# Patient Record
Sex: Female | Born: 1970 | Race: White | Hispanic: Yes | Smoking: Never smoker
Health system: Southern US, Community
[De-identification: ages and names within clinical notes are randomized; demographics above are authoritative.]

## PROBLEM LIST (undated history)

## (undated) ENCOUNTER — Emergency Department (HOSPITAL_BASED_OUTPATIENT_CLINIC_OR_DEPARTMENT_OTHER): Admission: EM | Payer: Self-pay

## (undated) ENCOUNTER — Ambulatory Visit: Admission: EM | Payer: Self-pay

## (undated) DIAGNOSIS — D649 Anemia, unspecified: Secondary | ICD-10-CM

## (undated) DIAGNOSIS — C801 Malignant (primary) neoplasm, unspecified: Secondary | ICD-10-CM

## (undated) HISTORY — PX: APPENDECTOMY: SHX54

## (undated) HISTORY — DX: Anemia, unspecified: D64.9

## (undated) HISTORY — PX: PARTIAL HYSTERECTOMY: SHX80

## (undated) HISTORY — PX: KIDNEY SURGERY: SHX687

---

## 1991-04-05 HISTORY — PX: OTHER SURGICAL HISTORY: SHX169

## 2011-02-28 ENCOUNTER — Other Ambulatory Visit (HOSPITAL_COMMUNITY): Payer: Self-pay | Admitting: Family Medicine

## 2011-02-28 DIAGNOSIS — Z1231 Encounter for screening mammogram for malignant neoplasm of breast: Secondary | ICD-10-CM

## 2011-04-06 ENCOUNTER — Ambulatory Visit (HOSPITAL_COMMUNITY)
Admission: RE | Admit: 2011-04-06 | Discharge: 2011-04-06 | Disposition: A | Discharge status: Home / Self Care | Payer: Self-pay | Source: Ambulatory Visit | Attending: Family Medicine | Admitting: Family Medicine

## 2011-04-06 DIAGNOSIS — Z1231 Encounter for screening mammogram for malignant neoplasm of breast: Secondary | ICD-10-CM | POA: Insufficient documentation

## 2011-04-19 ENCOUNTER — Emergency Department (HOSPITAL_COMMUNITY): Payer: Self-pay

## 2011-04-19 ENCOUNTER — Encounter (HOSPITAL_COMMUNITY): Payer: Self-pay | Admitting: *Deleted

## 2011-04-19 ENCOUNTER — Emergency Department (HOSPITAL_COMMUNITY)
Admission: EM | Admit: 2011-04-19 | Discharge: 2011-04-19 | Disposition: A | Discharge status: Home / Self Care | Payer: Self-pay | Attending: Emergency Medicine | Admitting: Emergency Medicine

## 2011-04-19 DIAGNOSIS — L03213 Periorbital cellulitis: Secondary | ICD-10-CM

## 2011-04-19 DIAGNOSIS — H5710 Ocular pain, unspecified eye: Secondary | ICD-10-CM | POA: Insufficient documentation

## 2011-04-19 DIAGNOSIS — H5789 Other specified disorders of eye and adnexa: Secondary | ICD-10-CM | POA: Insufficient documentation

## 2011-04-19 DIAGNOSIS — H00039 Abscess of eyelid unspecified eye, unspecified eyelid: Secondary | ICD-10-CM | POA: Insufficient documentation

## 2011-04-19 LAB — POCT I-STAT, CHEM 8
Calcium, Ion: 1.18 mmol/L (ref 1.12–1.32)
Chloride: 107 mEq/L (ref 96–112)
Glucose, Bld: 84 mg/dL (ref 70–99)
HCT: 34 % — ABNORMAL LOW (ref 36.0–46.0)
Hemoglobin: 11.6 g/dL — ABNORMAL LOW (ref 12.0–15.0)
TCO2: 24 mmol/L (ref 0–100)

## 2011-04-19 IMAGING — CT CT MAXILLOFACIAL W/ CM
4 of 5 series · 18 of 30 positions shown, 19 images · IV contrast (80ml omni 300)
Comparison: None.

CLINICAL DATA: Left orbital pain.  Question orbital cellulitis.

CT MAXILLOFACIAL WITH CONTRAST
TECHNIQUE: Multidetector CT imaging of the maxillofacial
structures was performed with intravenous contrast. Multiplanar CT
image reconstructions were also generated.
Contrast: 80mL OMNIPAQUE IOHEXOL 300 MG/ML IV SOLN

[Series 3: recon 2: supine facial bones · axial · 0.33mm/px · z∈[+60,+157]mm · 4 of 67 slices shown, 5 images]
[im 14/67  brain]
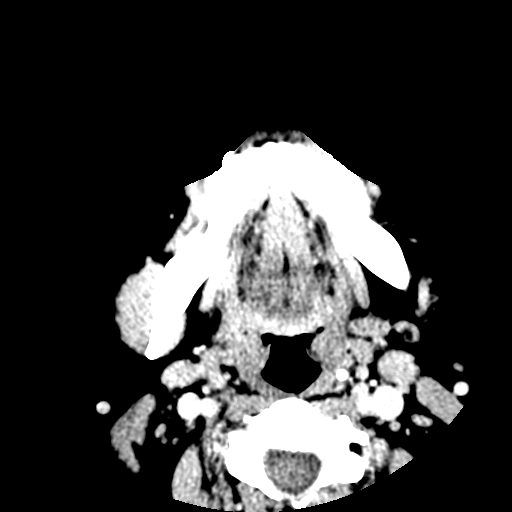
[im 14/67  bone]
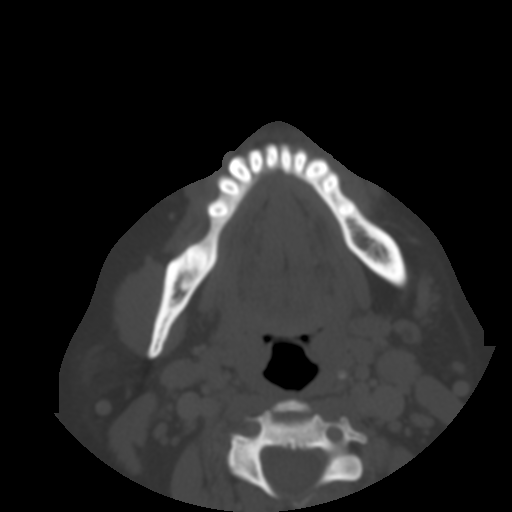
[im 27/67  bone]
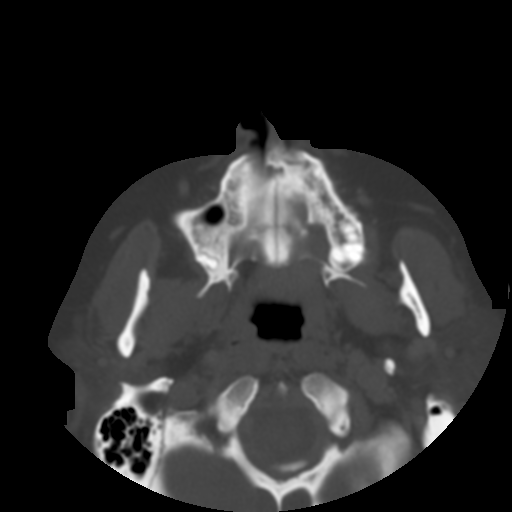
[im 40/67  bone]
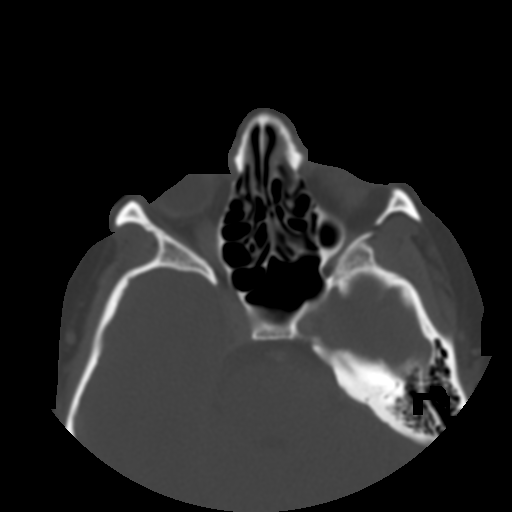
[im 53/67  bone]
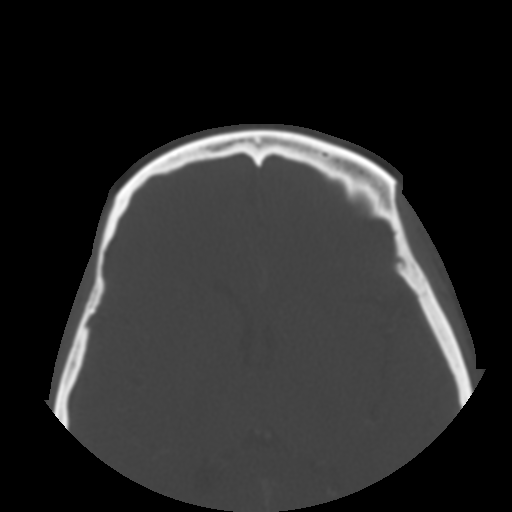

[Series 104: sag tissue · sagittal · 0.33mm/px · 5 of 81 slices shown]
[im 14/81  bone]
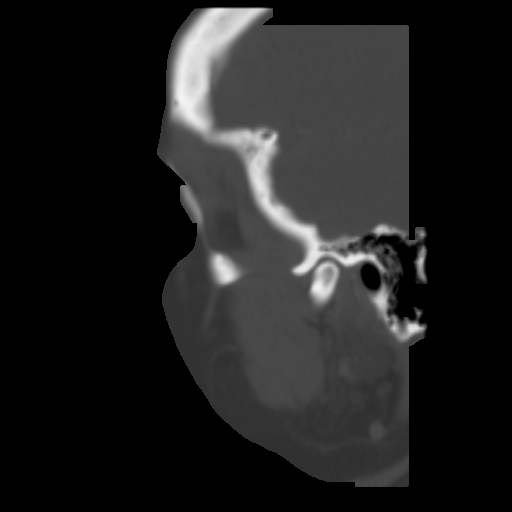
[im 27/81  bone]
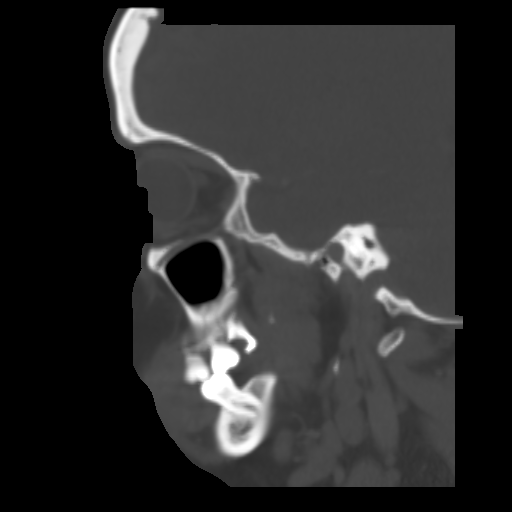
[im 41/81  bone]
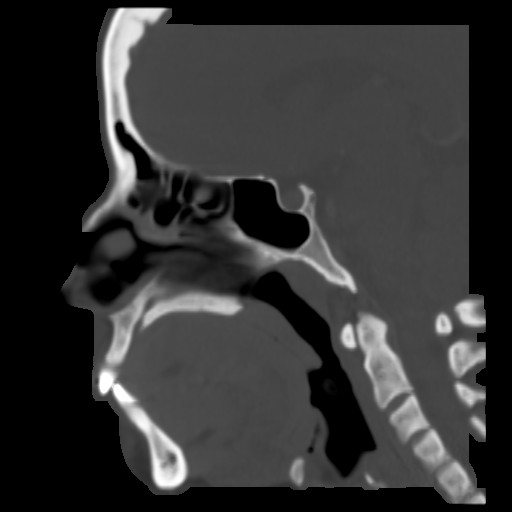
[im 54/81  bone]
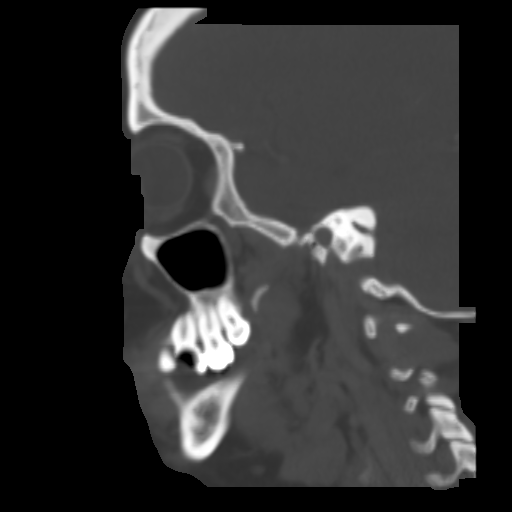
[im 67/81  bone]
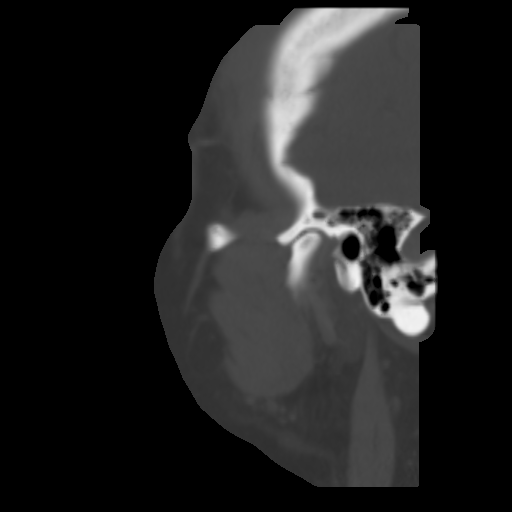

[Series 400: coronal bone · coronal · 0.33mm/px · 4 of 75 slices shown]
[im 15/75  bone]
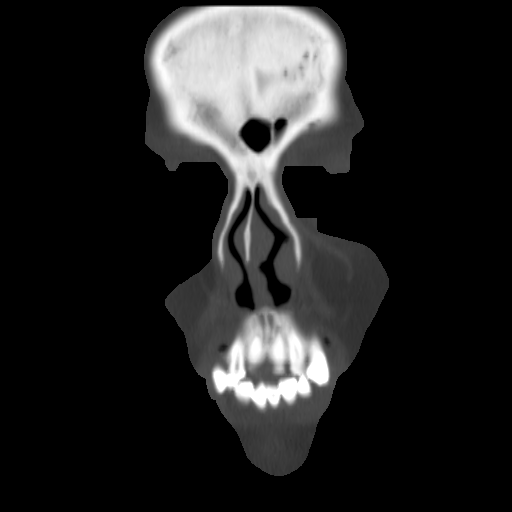
[im 30/75  bone]
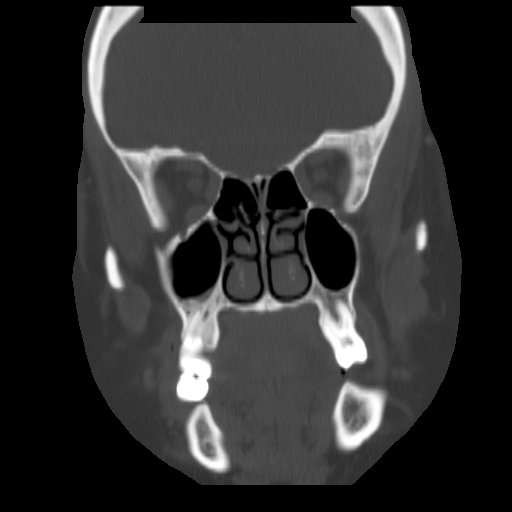
[im 45/75  bone]
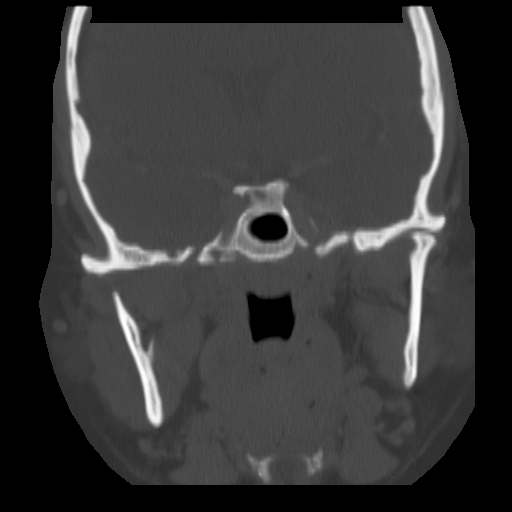
[im 60/75  bone]
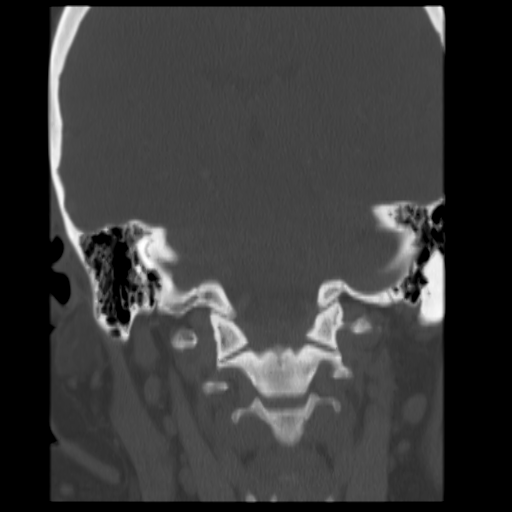

[Series 401: sag bone · sagittal · 0.33mm/px · 5 of 78 slices shown]
[im 13/78  bone]
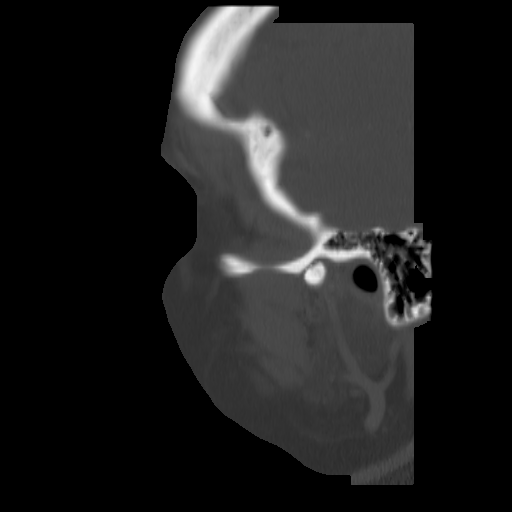
[im 26/78  bone]
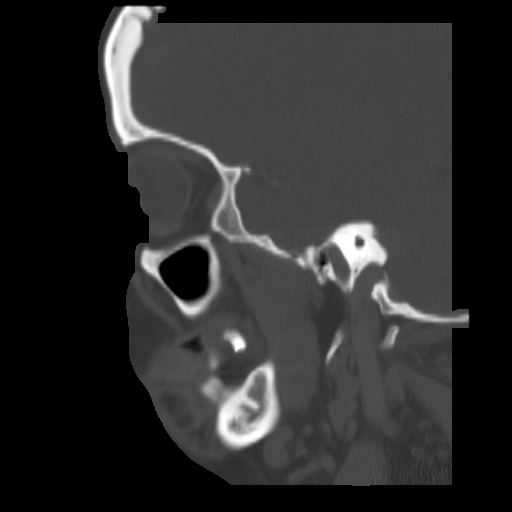
[im 39/78  bone]
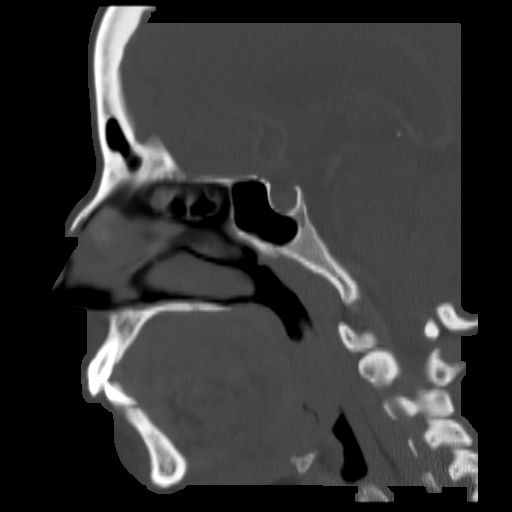
[im 52/78  bone]
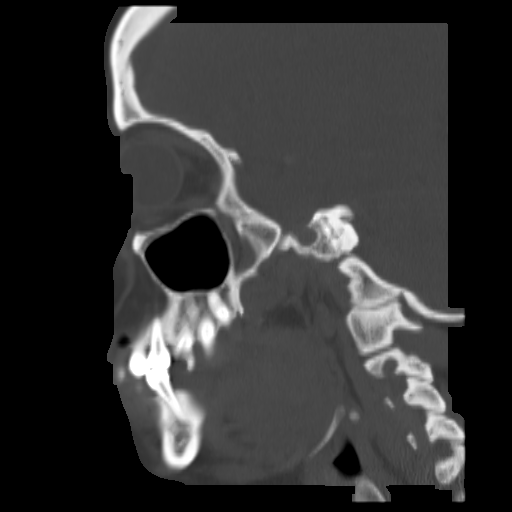
[im 65/78  bone]
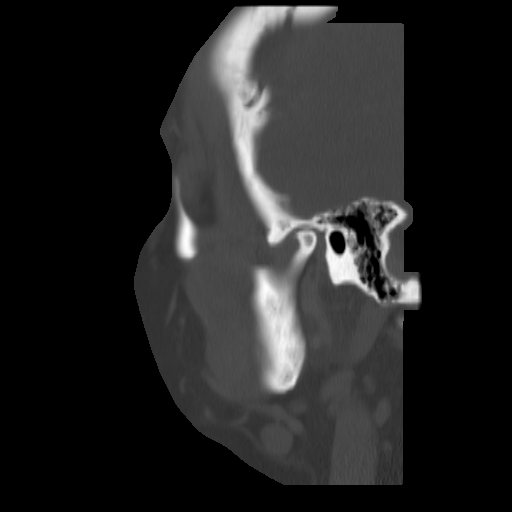

[18 of 30 positions shown; findings below may reference images not displayed]

FINDINGS: There is minimal haziness within the subcutaneous soft
tissues inferior to the left orbit which could represent very early
changes of infraorbital cellulitis.  No fluid collections.  No
orbital abnormality.  No orbital emphysema.  Paranasal sinuses are
clear.  No acute bony abnormality.
IMPRESSION: Very minimal stranding in the subcutaneous fat inferior to the left
orbit in the superior left cheek region.  No focal fluid
collections.

## 2011-04-19 MED ORDER — CLINDAMYCIN HCL 150 MG PO CAPS: 300.0000 mg | ORAL_CAPSULE | Freq: Three times a day (TID) | ORAL | Status: AC

## 2011-04-19 MED ORDER — IOHEXOL 300 MG/ML  SOLN
80.0000 mL | Freq: Once | INTRAMUSCULAR | Status: AC | PRN
  Administered 2011-04-19: 80 mL via INTRAVENOUS

## 2011-04-19 MED ORDER — IBUPROFEN 800 MG PO TABS
800.0000 mg | ORAL_TABLET | Freq: Once | ORAL | Status: AC
  Administered 2011-04-19: 800 mg via ORAL
  Filled 2011-04-19: qty 1

## 2011-04-19 MED ORDER — HYDROCODONE-ACETAMINOPHEN 5-325 MG PO TABS: 1.0000 | ORAL_TABLET | ORAL | Status: AC | PRN

## 2011-04-19 NOTE — ED Notes (Signed)
2 bumps came up in left eye and is painful.  No problem with vision.  Has pain with eye movemetn

## 2011-04-19 NOTE — ED Notes (Signed)
tTriage completed with use of phone interpreter

## 2011-04-19 NOTE — ED Provider Notes (Signed)
History     CSN: [id number]  Arrival date & time 04/19/11  1031   First MD Initiated Contact with Patient 04/19/11 1118      Chief Complaint  Patient presents with  . Eye Pain    left    (Consider location/radiation/quality/duration/timing/severity/associated sxs/prior treatment) HPI History provided by pt and interpretor.   Pt developed pain and edema inferior to left eye yesterday.  No associated vision changes.  Denies fever.  Denies trauma.  Does not wear contacts or glasses.  Has never had these sx in the past.   History reviewed. No pertinent past medical history.  Past Surgical History  Procedure Date  . Other surgical history     c-section    No family history on file.  History  Substance Use Topics  . Smoking status: Never Smoker   . Smokeless tobacco: Not on file  . Alcohol Use: No    OB History    Grav Para Term Preterm Abortions TAB SAB Ect Mult Living                  Review of Systems  All other systems reviewed and are negative.    Allergies  Review of patient's allergies indicates no known allergies.  Home Medications  No current outpatient prescriptions on file.  BP 128/63  Pulse 67  Resp 18  SpO2 97%  Physical Exam  Nursing note and vitals reviewed. Constitutional: She is oriented to person, place, and time. She appears well-developed and well-nourished. No distress.  HENT:  Head: Normocephalic and atraumatic.  Eyes:       Mild edema as well as tenderness just inferior to left eye.  PERRL.  Globe non-tender and conjunctiva w/out injection.  EOMi but pt reports pain and appears uncomfortable w/ eye movement.  Vision 20/30 bilaterally and 20/25 both.  Neck: Normal range of motion.  Neurological: She is alert and oriented to person, place, and time.  Psychiatric: She has a normal mood and affect. Her behavior is normal.    ED Course  Procedures (including critical care time)  Labs Reviewed  POCT I-STAT, CHEM 8 - Abnormal;  Notable for the following:    Hemoglobin 11.6 (*)    HCT 34.0 (*)    All other components within normal limits  I-STAT, CHEM 8   Ct Maxillofacial W/cm  04/19/2011  *RADIOLOGY REPORT*  Clinical Data: Left orbital pain.  Question orbital cellulitis.  CT MAXILLOFACIAL WITH CONTRAST  Technique:  Multidetector CT imaging of the maxillofacial structures was performed with intravenous contrast. Multiplanar CT image reconstructions were also generated.  Contrast: 80mL OMNIPAQUE IOHEXOL 300 MG/ML IV SOLN  Comparison: None.  Findings: There is minimal haziness within the subcutaneous soft tissues inferior to the left orbit which could represent very early changes of infraorbital cellulitis.  No fluid collections.  No orbital abnormality.  No orbital emphysema.  Paranasal sinuses are clear.  No acute bony abnormality.  IMPRESSION: Very minimal stranding in the subcutaneous fat inferior to the left orbit in the superior left cheek region.  No focal fluid collections.  Original Report Authenticated By: Cyndie Chime, M.D.     1. Preseptal cellulitis       MDM  Pt presents w/ non-traumatic L eye pain.  No associated fever or vision changes.  Exam sig for edema and tenderness of L lower orbit and nml vision.  CT maxillofacial ordered to r/o orbital cellulitis.    CT shows minimal stranding of subq fat inferior  to left orbit which could represent early preseptal cellulitis.  Results discussed w/ pt via interpreter.  D/c'd home w/ clinda, vicodin and referral to ophtho for persistent sx.  Return precautions discussed. 1:53 PM        Otilio Miu, [place] 04/19/11 1545

## 2011-04-19 NOTE — ED Provider Notes (Signed)
Medical screening examination/treatment/procedure(s) were performed by non-physician practitioner and as supervising physician I was immediately available for consultation/collaboration.  Nicholes Stairs, MD 04/19/11 [phone]

## 2011-07-28 ENCOUNTER — Encounter (HOSPITAL_COMMUNITY): Payer: Self-pay | Admitting: *Deleted

## 2011-07-28 ENCOUNTER — Emergency Department (HOSPITAL_COMMUNITY)
Admission: EM | Admit: 2011-07-28 | Discharge: 2011-07-28 | Disposition: A | Discharge status: Home / Self Care | Payer: Self-pay | Source: Home / Self Care

## 2011-07-28 DIAGNOSIS — L600 Ingrowing nail: Secondary | ICD-10-CM

## 2011-07-28 NOTE — ED Provider Notes (Signed)
Mariah Lang is a 41 y.o. female who presents to urgent care today for ingrown right great toenail. Present for many years but worse he became painful and swollen about one week ago. She applied an antibiotic purple dye to it recently. No fevers or chills. She's never had a toenail removal. She feels well otherwise.   PMH, SH reviewed: Otherwise healthy woman nonsmoker ROS as above  Medications reviewed. No current facility-administered medications for this encounter.   No current outpatient prescriptions on file.    Exam:  BP 141/85  Pulse 80  Temp(Src) 98.4 F (36.9 C) (Oral)  Resp 18  SpO2 95% Gen: Well NAD RIGHT TOENAIL: Significantly ingrown on the medial aspect. Hypertrophied tissue around the toenail that is erythematous and tender.  Procedure note: Partial toenail removal.  Consent obtained and timeout performed. Area cleaned with Betadine. 7 mL of 2% lidocaine without epinephrine were injected on the medial and dorsal nerves and underneath the nailbed. Tourniquet was applied. The nail elevator was used to elevate the medial aspect of the nail. Large scissors were used to cut the medial nail sliver approximately 5 mm in width.  The nail sliver was grabbed with hemostats and removed as a whole piece.  The nail bed was then scraped.  Tourniquet removed. Total tourniquet time less than 5 minutes. A dressing was then applied. Patient tolerated the procedure well  No results found for this or any previous visit (from the past 72 hour(s)).  Assessment and plan:  41 year old woman with ingrown toenail.  Medial partial, removal performed today. Patient tolerated the procedure well. Discussed warning signs or symptoms or further infection. Patient expresses understanding. Handout in Spanish provided.  Follow up with primary care doctor in 1-4 weeks.   Rodolph Bong, MD 07/28/11 [phone]

## 2011-07-28 NOTE — Discharge Instructions (Signed)
[facility].  Regresse con Engineer, petroleum en 3 semanas.

## 2011-07-28 NOTE — ED Notes (Signed)
Pt  Has  painfull  Swollen l  Big  Toe it  Is  Red   And  painfull    The  Toe  Appears  To  Be  Ingrown        She  Has  Had   The  Symptoms  For  About  1  Week

## 2011-07-30 NOTE — ED Provider Notes (Signed)
Medical screening examination/treatment/procedure(s) were performed by a resident physician and as supervising physician I was immediately available for consultation/collaboration.  Leslee Home, M.D.   Reuben Likes, MD 07/30/11 0930

## 2012-08-31 ENCOUNTER — Encounter (HOSPITAL_COMMUNITY): Payer: Self-pay | Admitting: Emergency Medicine

## 2012-08-31 ENCOUNTER — Inpatient Hospital Stay (HOSPITAL_COMMUNITY)
Admission: AD | Admit: 2012-08-31 | Discharge: 2012-08-31 | Disposition: A | Discharge status: Home / Self Care | Payer: PRIVATE HEALTH INSURANCE | Source: Ambulatory Visit | Attending: Obstetrics & Gynecology | Admitting: Obstetrics & Gynecology

## 2012-08-31 ENCOUNTER — Encounter (HOSPITAL_COMMUNITY): Payer: Self-pay | Admitting: *Deleted

## 2012-08-31 ENCOUNTER — Emergency Department (HOSPITAL_COMMUNITY)
Admission: EM | Admit: 2012-08-31 | Discharge: 2012-08-31 | Disposition: A | Discharge status: Home / Self Care | Payer: PRIVATE HEALTH INSURANCE | Source: Home / Self Care | Attending: Family Medicine | Admitting: Family Medicine

## 2012-08-31 ENCOUNTER — Inpatient Hospital Stay (HOSPITAL_COMMUNITY): Payer: PRIVATE HEALTH INSURANCE

## 2012-08-31 DIAGNOSIS — N949 Unspecified condition associated with female genital organs and menstrual cycle: Secondary | ICD-10-CM | POA: Insufficient documentation

## 2012-08-31 DIAGNOSIS — N926 Irregular menstruation, unspecified: Secondary | ICD-10-CM | POA: Insufficient documentation

## 2012-08-31 DIAGNOSIS — N7013 Chronic salpingitis and oophoritis: Secondary | ICD-10-CM | POA: Insufficient documentation

## 2012-08-31 DIAGNOSIS — D259 Leiomyoma of uterus, unspecified: Secondary | ICD-10-CM

## 2012-08-31 DIAGNOSIS — N7011 Chronic salpingitis: Secondary | ICD-10-CM

## 2012-08-31 DIAGNOSIS — D649 Anemia, unspecified: Secondary | ICD-10-CM | POA: Insufficient documentation

## 2012-08-31 DIAGNOSIS — N944 Primary dysmenorrhea: Secondary | ICD-10-CM

## 2012-08-31 DIAGNOSIS — N946 Dysmenorrhea, unspecified: Secondary | ICD-10-CM

## 2012-08-31 LAB — POCT URINALYSIS DIP (DEVICE)
Ketones, ur: NEGATIVE mg/dL
Protein, ur: NEGATIVE mg/dL
Specific Gravity, Urine: 1.02 (ref 1.005–1.030)
Urobilinogen, UA: 0.2 mg/dL (ref 0.0–1.0)
pH: 7 (ref 5.0–8.0)

## 2012-08-31 LAB — CBC
HCT: 28.7 % — ABNORMAL LOW (ref 36.0–46.0)
MCH: 18.2 pg — ABNORMAL LOW (ref 26.0–34.0)
MCHC: 28.6 g/dL — ABNORMAL LOW (ref 30.0–36.0)
MCV: 63.8 fL — ABNORMAL LOW (ref 78.0–100.0)
RDW: 18.5 % — ABNORMAL HIGH (ref 11.5–15.5)
WBC: 9.1 10*3/uL (ref 4.0–10.5)

## 2012-08-31 LAB — WET PREP, GENITAL: Yeast Wet Prep HPF POC: NONE SEEN

## 2012-08-31 LAB — POCT PREGNANCY, URINE: Preg Test, Ur: NEGATIVE

## 2012-08-31 IMAGING — US US TRANSVAGINAL NON-OB
1 series · 13 of 25 positions shown · non-contrast
Comparison: None.

CLINICAL DATA: Dysmenorrhea.  Heavy vaginal bleeding for several
months.



[Series 1: us pelvis complete · 77 acquisitions, 13 frames shown]
[im 1/77]
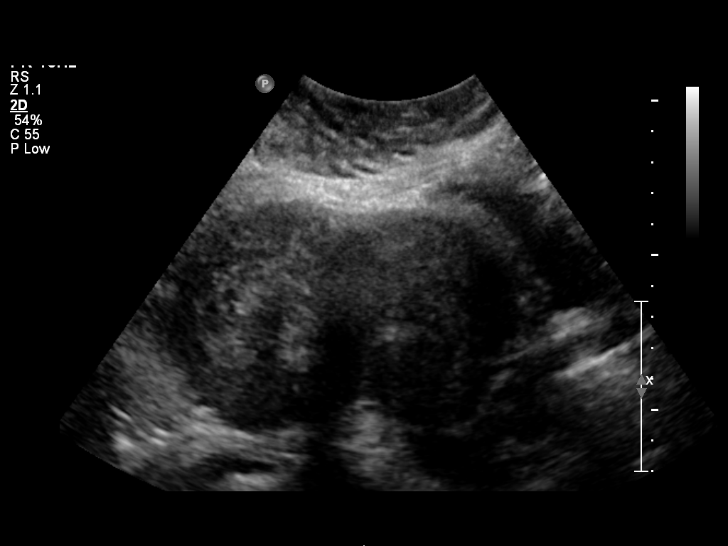
[im 7/77]
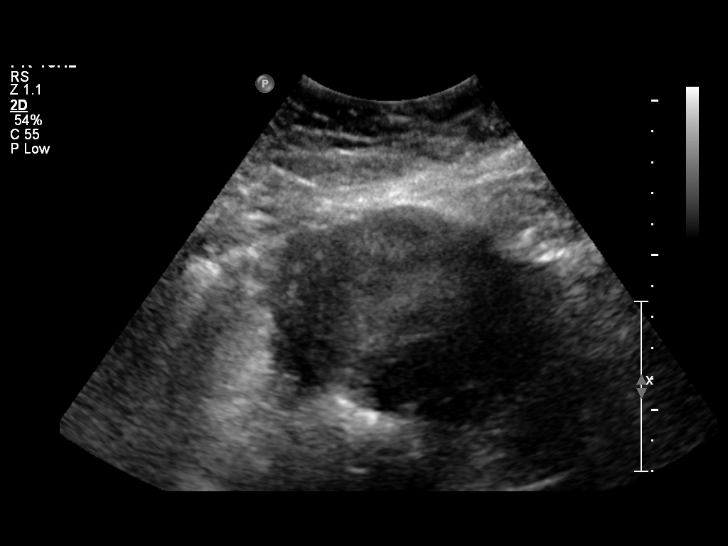
[im 13/77]
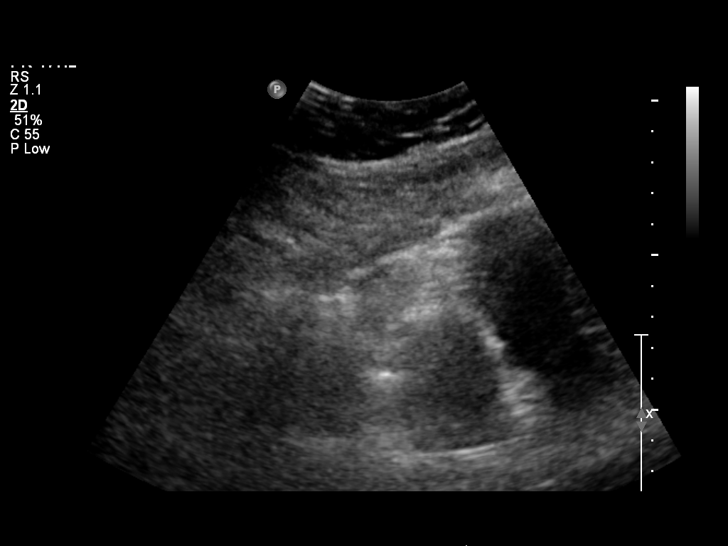
[im 20/77]
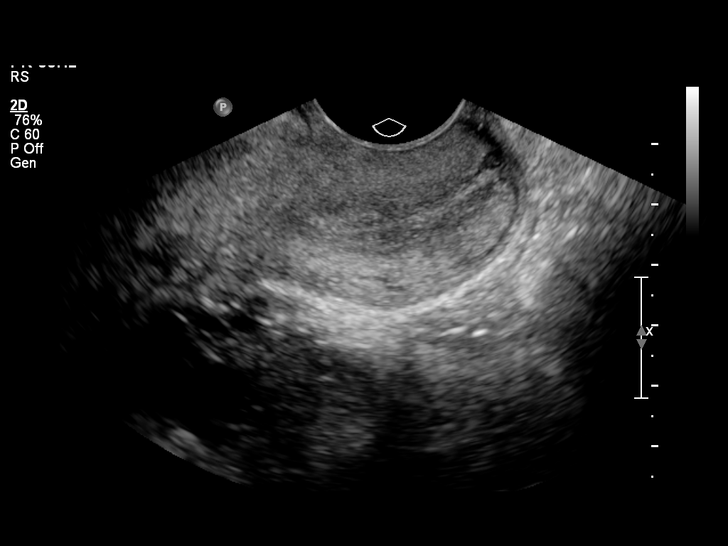
[im 26/77]
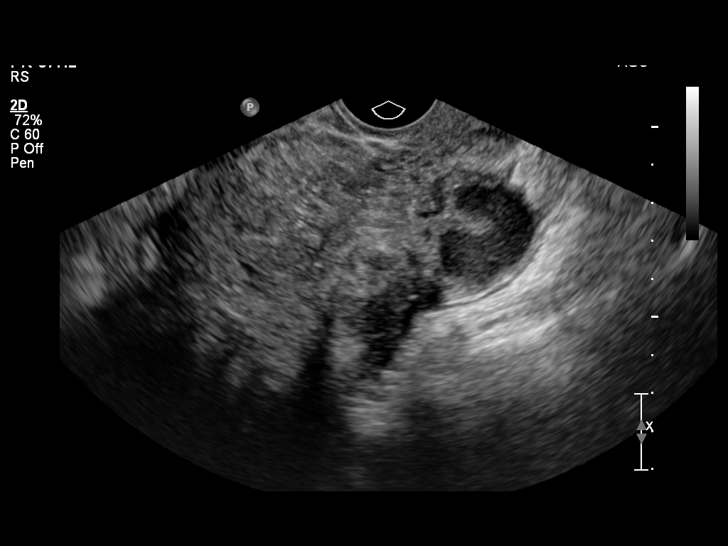
[im 32/77]
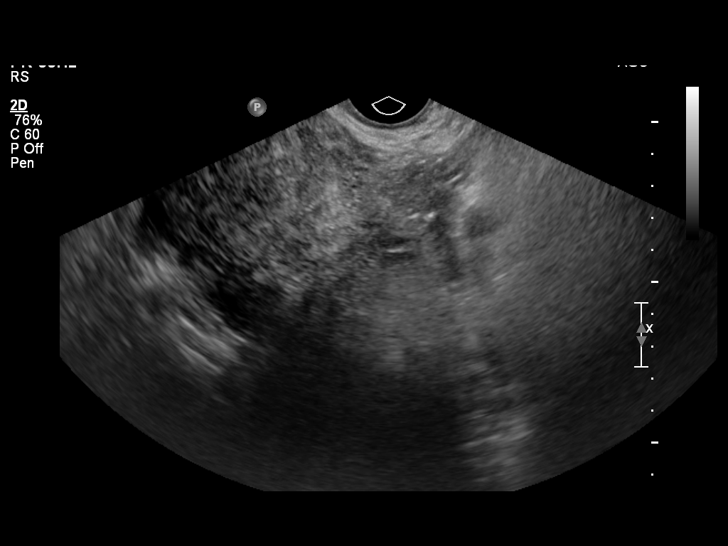
[im 39/77]
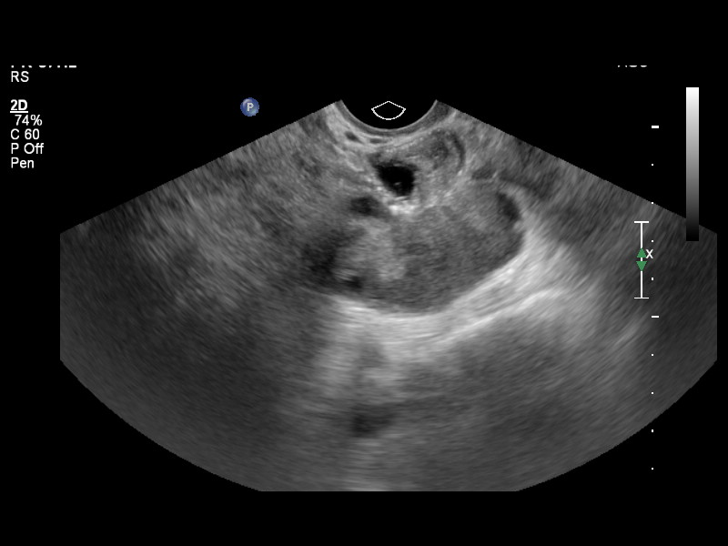
[im 45/77]
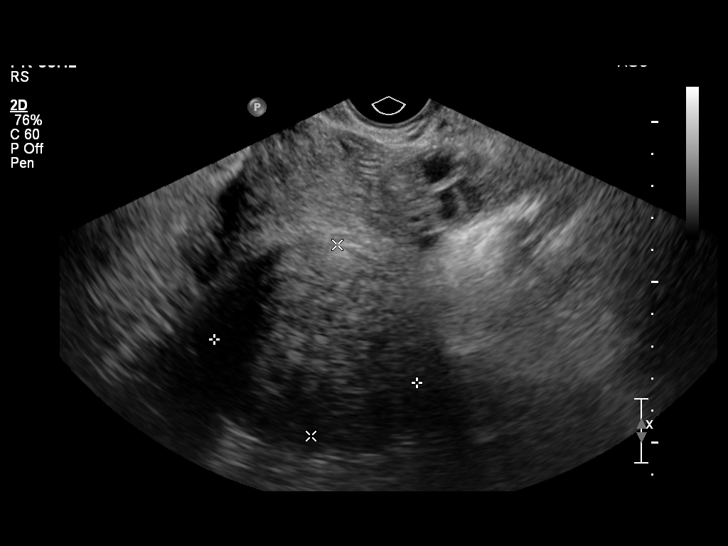
[im 51/77]
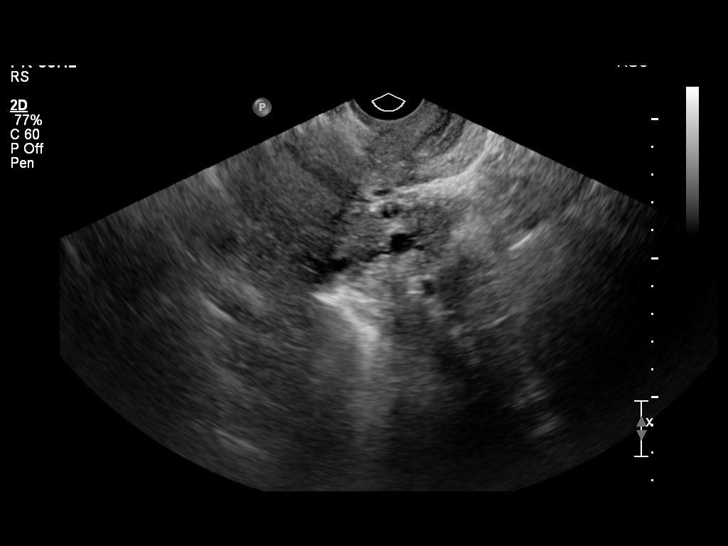
[im 58/77]
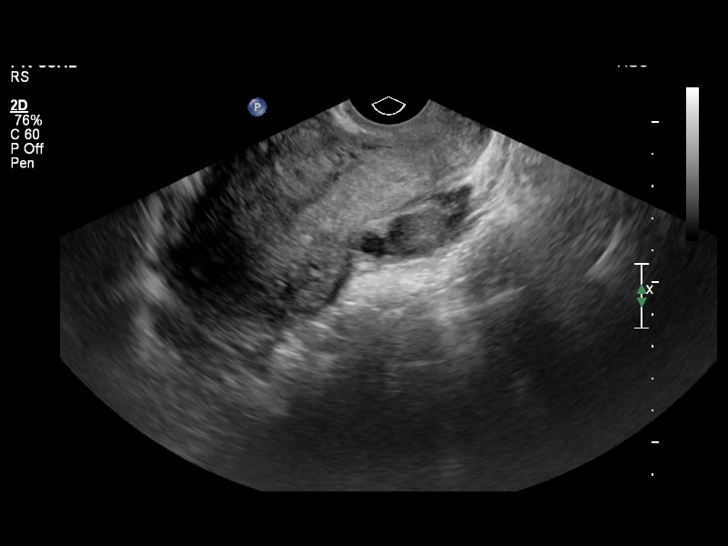
[im 64/77]
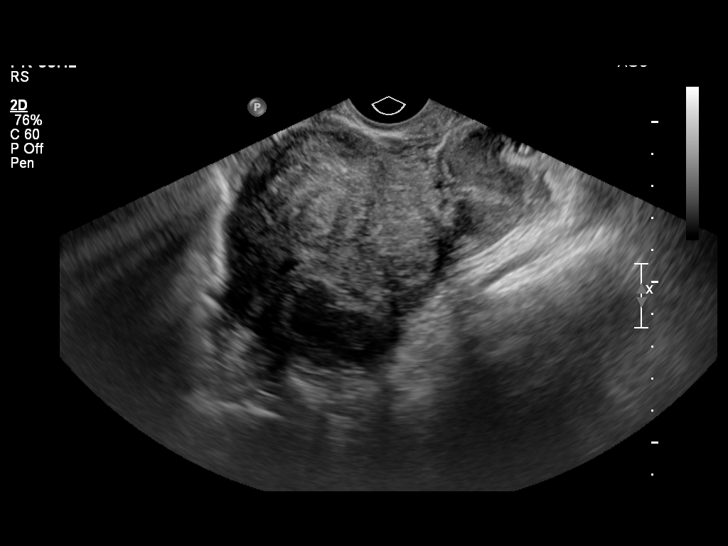
[im 70/77]
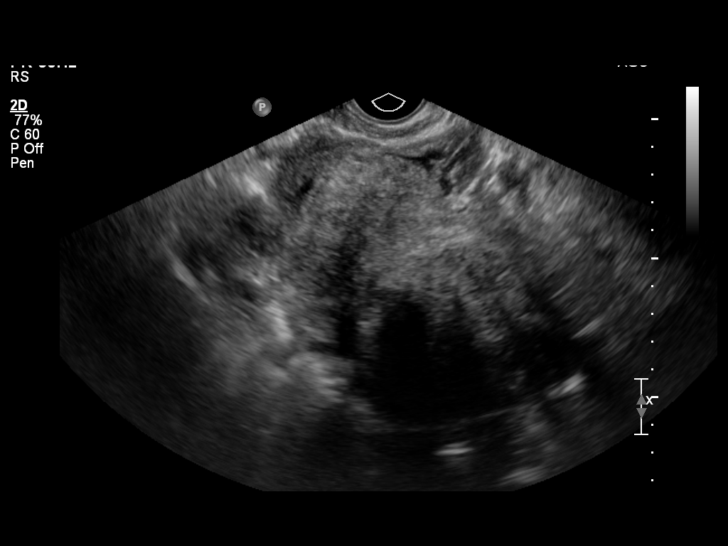
[im 77/77]
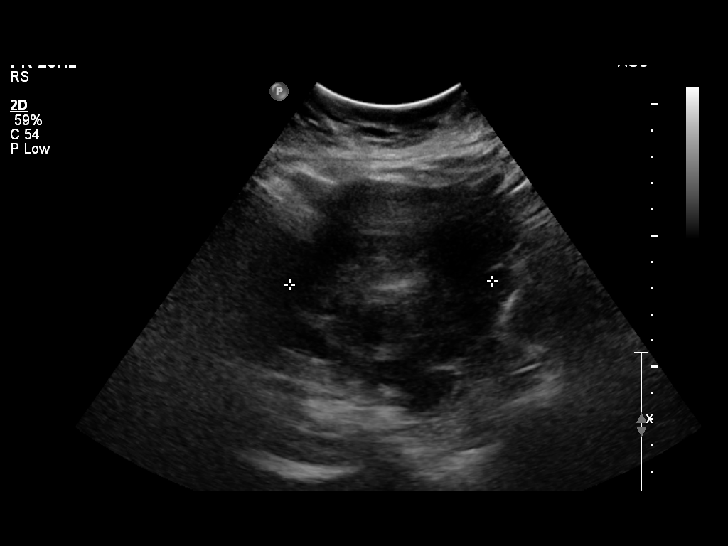

[13 of 25 positions shown; findings below may reference images not displayed]

FINDINGS: Uterus:  13.1 x 7.5 x 8.1 cm.  Uterine fibroids are seen.  At least
two discrete fibroids are measurable, largest in the fundal region
measuring 7.7 cm and another in the right anterior corpus measuring
3.7 cm. Probable additional fibroids are present, which are
difficult to measure.

Endometrium: Not well visualized due to acoustic shadowing from
fibroids described above.

Right ovary: not directly visualized by transabdominal or
transvaginal sonography. A tubular structure containing echogenic
fluid is seen in the right adnexa, consistent with JIM
salpinx.

Left ovary: not directly visualized by transabdominal or
transvaginal sonography.  A tubular structure containing echogenic
fluid is seen in the left adnexa, consistent with JIM
salpinx.

Other Findings:  No free fluid
IMPRESSION: 1.  Bilateral JIM.  Pelvic inflammatory disease
cannot be excluded.
2.  Nonvisualization of the ovaries, however no other adnexal
masses or free fluid identified.
3.  Uterine fibroids, largest measuring 7.7 cm.  Endometrium not
well visualized due to acoustic shadowing from fibroids.

## 2012-08-31 MED ORDER — FERROUS SULFATE 325 (65 FE) MG PO TABS: 325.0000 mg | ORAL_TABLET | Freq: Two times a day (BID) | ORAL | Status: DC

## 2012-08-31 MED ORDER — OXYCODONE-ACETAMINOPHEN 5-325 MG PO TABS: 1.0000 | ORAL_TABLET | ORAL | Status: DC | PRN

## 2012-08-31 MED ORDER — MEGESTROL ACETATE 40 MG PO TABS: ORAL_TABLET | ORAL | Status: DC

## 2012-08-31 MED ORDER — KETOROLAC TROMETHAMINE 60 MG/2ML IM SOLN
60.0000 mg | Freq: Once | INTRAMUSCULAR | Status: AC
  Administered 2012-08-31: 60 mg via INTRAMUSCULAR
  Filled 2012-08-31: qty 2

## 2012-08-31 MED ORDER — DOXYCYCLINE HYCLATE 50 MG PO CAPS: 100.0000 mg | ORAL_CAPSULE | Freq: Two times a day (BID) | ORAL | Status: DC

## 2012-08-31 NOTE — MAU Note (Signed)
Seen today at urgent care by [facility] and seen by Dr. Artis Flock for dysmenorrhea and was told to come here for further eval.  Pt had her menstral period earlier in the month and then started bleeding heavy again yesterday.  IUD was removed 2 years ago and pt c/o irregular bleeding since then.  Pt states Motrin isn't taking care of the abd cramping.  Passing large fist size clots and changing sanitary pad every 2-3 hours.

## 2012-08-31 NOTE — ED Provider Notes (Signed)
History     CSN: [id number]  Arrival date & time 08/31/12  1200   First MD Initiated Contact with Patient 08/31/12 1322      Chief Complaint  Patient presents with  . Abdominal Pain    (Consider location/radiation/quality/duration/timing/severity/associated sxs/prior treatment) Patient is a 42 y.o. female presenting with abdominal pain. The history is provided by the patient.  Abdominal Pain This is a new problem. The current episode started 12 to 24 hours ago (onset of cramps and heavy clots and pain, sx primarily related to removal of mirena iud.). The problem has been gradually worsening. Associated symptoms include abdominal pain.    History reviewed. No pertinent past medical history.  Past Surgical History  Procedure Laterality Date  . Other surgical history      c-section  . Cesarean section    . Appendectomy      No family history on file.  History  Substance Use Topics  . Smoking status: Never Smoker   . Smokeless tobacco: Not on file  . Alcohol Use: No    OB History   Grav Para Term Preterm Abortions TAB SAB Ect Mult Living                  Review of Systems  Constitutional: Negative.   Gastrointestinal: Positive for abdominal pain. Negative for nausea, vomiting, diarrhea and constipation.  Genitourinary: Positive for vaginal bleeding and menstrual problem.    Allergies  Review of patient's allergies indicates no known allergies.  Home Medications  No current outpatient prescriptions on file.  BP 140/86  Pulse 97  Temp(Src) 97.3 F (36.3 C) (Oral)  Resp 20  SpO2 100%  LMP 08/31/2012  Physical Exam  Nursing note and vitals reviewed. Constitutional: She is oriented to person, place, and time. She appears well-developed and well-nourished.  Abdominal: Soft. Bowel sounds are normal. She exhibits no distension and no mass. There is tenderness in the right lower quadrant and suprapubic area. There is no rigidity, no rebound and no guarding.   Neurological: She is alert and oriented to person, place, and time.    ED Course  Procedures (including critical care time)  Labs Reviewed  POCT URINALYSIS DIP (DEVICE) - Abnormal; Notable for the following:    Hgb urine dipstick MODERATE (*)    Leukocytes, UA TRACE (*)    All other components within normal limits   No results found.   1. Primary dysmenorrhea       MDM          Linna Hoff, MD 08/31/12 1352

## 2012-08-31 NOTE — MAU Provider Note (Signed)
History     CSN: [id number]  Arrival date and time: 08/31/12 1425   None     Chief Complaint  Patient presents with  . Dysmenorrhea   HPI 42 y.o. Mariah Lang with heavy vaginal bleeding and pelvic pain. Bleeding started yesterday. Patient's last menstrual period was 08/02/2012. States periods have been irregular, heavy and painful since having IUD removed last year, this time pain is worse. Usually relieved by ibuprofen, 800 mg did not relieve pain last night, however.   Past Medical History  Diagnosis Date  . Medical history non-contributory     Past Surgical History  Procedure Laterality Date  . Other surgical history      c-section  . Appendectomy    . Cesarean section      History reviewed. No pertinent family history.  History  Substance Use Topics  . Smoking status: Never Smoker   . Smokeless tobacco: Not on file  . Alcohol Use: No    Allergies: No Known Allergies  No prescriptions prior to admission    ROS Physical Exam   Blood pressure 141/85, pulse 62, temperature 97.6 F (36.4 C), temperature source Oral, resp. rate 18, last menstrual period 08/02/2012, SpO2 100.00%.  Physical Exam  Nursing note and vitals reviewed. Constitutional: She is oriented to person, place, and time. She appears well-developed and well-nourished. No distress.  HENT:  Head: Normocephalic and atraumatic.  Cardiovascular: Normal rate and regular rhythm.   Respiratory: Effort normal. No respiratory distress.  GI: Soft. She exhibits no distension and no mass. There is no tenderness. There is no rebound and no guarding.  Genitourinary: There is no rash or lesion on the right labia. There is no rash or lesion on the left labia. Uterus is tender. Uterus is not deviated, not enlarged and not fixed. Cervix exhibits motion tenderness. Cervix exhibits no discharge and no friability. Right adnexum displays no mass, no tenderness and no fullness. Left adnexum displays no mass, no tenderness  and no fullness. There is bleeding (moderate) around the vagina. No erythema or tenderness around the vagina. No vaginal discharge found.  Exam limited by body habitus   Musculoskeletal: Normal range of motion.  Neurological: She is alert and oriented to person, place, and time.  Skin: Skin is warm and dry.  Psychiatric: She has a normal mood and affect.    MAU Course  Procedures Results for orders placed during the hospital encounter of 08/31/12 (from the past 24 hour(s))  CBC     Status: Abnormal   Collection Time    08/31/12  3:09 PM      Result Value Range   WBC 9.1  4.0 - 10.5 K/uL   RBC 4.50  3.87 - 5.11 MIL/uL   Hemoglobin 8.2 (*) 12.0 - 15.0 g/dL   HCT 14.7 (*) 82.9 - 56.2 %   MCV 63.8 (*) 78.0 - 100.0 fL   MCH 18.2 (*) 26.0 - 34.0 pg   MCHC 28.6 (*) 30.0 - 36.0 g/dL   RDW 13.0 (*) 86.5 - 78.4 %   Platelets 515 (*) 150 - 400 K/uL  POCT PREGNANCY, URINE     Status: None   Collection Time    08/31/12  3:26 PM      Result Value Range   Preg Test, Ur NEGATIVE  NEGATIVE  WET PREP, GENITAL     Status: Abnormal   Collection Time    08/31/12  3:40 PM      Result Value Range   Yeast Wet  Prep HPF POC NONE SEEN  NONE SEEN   Trich, Wet Prep NONE SEEN  NONE SEEN   Clue Cells Wet Prep HPF POC NONE SEEN  NONE SEEN   WBC, Wet Prep HPF POC FEW (*) NONE SEEN   US Transvaginal Non-ob  08/31/2012   *RADIOLOGY REPORT*  Clinical Data: Dysmenorrhea.  Heavy vaginal bleeding for several months.  TRANSABDOMINAL AND TRANSVAGINAL ULTRASOUND OF PELVIS  Technique:  Both transabdominal and transvaginal ultrasound examinations of the pelvis were performed.  Transabdominal technique was performed for global imaging of the pelvis including uterus, ovaries, adnexal regions, and pelvic cul-de-sac.  It was necessary to proceed with endovaginal exam following the transabdominal exam to visualize the endometrium and adnexae.  Comparison:  None.  Findings: Uterus:  13.1 x 7.5 x 8.1 cm.  Uterine fibroids are  seen.  At least two discrete fibroids are measurable, largest in the fundal region measuring 7.7 cm and another in the right anterior corpus measuring 3.7 cm. Probable additional fibroids are present, which are difficult to measure.  Endometrium: Not well visualized due to acoustic shadowing from fibroids described above.  Right ovary: not directly visualized by transabdominal or transvaginal sonography. A tubular structure containing echogenic fluid is seen in the right adnexa, consistent with a hydro-pyo- salpinx.  Left ovary: not directly visualized by transabdominal or transvaginal sonography.  A tubular structure containing echogenic fluid is seen in the left adnexa, consistent with a hydro-pyo- salpinx.  Other Findings:  No free fluid  IMPRESSION:  1.  Bilateral hydro-pyo-salpinges.  Pelvic inflammatory disease cannot be excluded. 2.  Nonvisualization of the ovaries, however no other adnexal masses or free fluid identified. 3.  Uterine fibroids, largest measuring 7.7 cm.  Endometrium not well visualized due to acoustic shadowing from fibroids.   Original Report Authenticated By: Myles Rosenthal, M.D.   US Pelvis Complete  08/31/2012   *RADIOLOGY REPORT*  Clinical Data: Dysmenorrhea.  Heavy vaginal bleeding for several months.  TRANSABDOMINAL AND TRANSVAGINAL ULTRASOUND OF PELVIS  Technique:  Both transabdominal and transvaginal ultrasound examinations of the pelvis were performed.  Transabdominal technique was performed for global imaging of the pelvis including uterus, ovaries, adnexal regions, and pelvic cul-de-sac.  It was necessary to proceed with endovaginal exam following the transabdominal exam to visualize the endometrium and adnexae.  Comparison:  None.  Findings: Uterus:  13.1 x 7.5 x 8.1 cm.  Uterine fibroids are seen.  At least two discrete fibroids are measurable, largest in the fundal region measuring 7.7 cm and another in the right anterior corpus measuring 3.7 cm. Probable additional fibroids are  present, which are difficult to measure.  Endometrium: Not well visualized due to acoustic shadowing from fibroids described above.  Right ovary: not directly visualized by transabdominal or transvaginal sonography. A tubular structure containing echogenic fluid is seen in the right adnexa, consistent with a hydro-pyo- salpinx.  Left ovary: not directly visualized by transabdominal or transvaginal sonography.  A tubular structure containing echogenic fluid is seen in the left adnexa, consistent with a hydro-pyo- salpinx.  Other Findings:  No free fluid  IMPRESSION:  1.  Bilateral hydro-pyo-salpinges.  Pelvic inflammatory disease cannot be excluded. 2.  Nonvisualization of the ovaries, however no other adnexal masses or free fluid identified. 3.  Uterine fibroids, largest measuring 7.7 cm.  Endometrium not well visualized due to acoustic shadowing from fibroids.   Original Report Authenticated By: Myles Rosenthal, M.D.    Assessment and Plan   1. Dysmenorrhea   2. Hydrosalpinx  3. Fibroid uterus   Megace to stop bleeding, iron for anemia, percocet/motrin for pain. Doxycycline rx to cover for PID considering hydro-pyo-salpinx on u/s - no other signs of infection. Precautions rev'd. Follow up in [facility].     Medication List    TAKE these medications       doxycycline 50 MG capsule  Commonly known as:  VIBRAMYCIN  Take 2 capsules (100 mg total) by mouth 2 (two) times daily.     ferrous sulfate 325 (65 FE) MG tablet  Take 1 tablet (325 mg total) by mouth 2 (two) times daily.     ibuprofen 800 MG tablet  Commonly known as:  ADVIL,MOTRIN  Take 800 mg by mouth every 8 (eight) hours as needed for pain (pain).     megestrol 40 MG tablet  Commonly known as:  MEGACE  1 tab po tid until bleeding stops, then 1 tab daily until seen for follow up     oxyCODONE-acetaminophen 5-325 MG per tablet  Commonly known as:  PERCOCET/ROXICET  Take 1 tablet by mouth every 4 (four) hours as needed for pain.             Follow-up Information   Follow up with [facility]. (someone will call you to schedule appointment)    Contact information:   [address] [place] [address] [phone]        [facility] 08/31/2012, 7:31 PM

## 2012-08-31 NOTE — ED Notes (Signed)
Pt c/o lower abd pain onset last night... Reports she's menstruating today Pain is similar to her usual menstrual pains but more intense and are not being relieved by ibup 800mg .  Denies: f/v/n/d, dysuria, constipation She is alert and oriented w/no signs of acute distress.

## 2012-09-01 NOTE — MAU Provider Note (Signed)
Attestation of Attending Supervision of Advanced Practitioner (CNM/NP): Evaluation and management procedures were performed by the Advanced Practitioner under my supervision and collaboration.  I have reviewed the Advanced Practitioner's note and chart, and I agree with the management and plan.  HARRAWAY-SMITH, Brindy Higginbotham 8:44 AM

## 2012-09-11 ENCOUNTER — Encounter: Payer: Self-pay | Admitting: Obstetrics & Gynecology

## 2012-10-03 ENCOUNTER — Ambulatory Visit (INDEPENDENT_AMBULATORY_CARE_PROVIDER_SITE_OTHER): Payer: PRIVATE HEALTH INSURANCE | Admitting: Obstetrics & Gynecology

## 2012-10-03 VITALS — BP 123/80 | HR 62 | Temp 97.7°F | Wt 187.0 lb

## 2012-10-03 DIAGNOSIS — N946 Dysmenorrhea, unspecified: Secondary | ICD-10-CM

## 2012-10-03 MED ORDER — IBUPROFEN 800 MG PO TABS
800.0000 mg | ORAL_TABLET | Freq: Three times a day (TID) | ORAL | Status: DC | PRN
Start: 2012-10-03 — End: 2014-11-24

## 2012-10-03 NOTE — Progress Notes (Signed)
  Subjective:    Patient ID: Mariah Lang, female    DOB: 06/29/1970, 42 y.o.   MRN: [id number]  HPI Pt here for MAU follow up for dysmenorrhea  Pt having continued symptoms all associated with her period. She is having bleeding for approx 4-5 day every 4 weeks. Her periods are accompanied by lots of pain described as crampy beginning just before her period. The pain meds are helping but making her very sleepy. She does get some relief from ibuprofen.   She did not previously have painful or heavy periods.   She reports that previously she had an IUD for 13 years which was removed 2 years ago. Her symptoms have been worsening since then.   She has finished her recent antibiotic course and notes imprvemnt in symptoms at that time.    Review of Systems Per HPI    Objective:   Physical Exam CBC    Component Value Date/Time   WBC 9.1 08/31/2012 1509   RBC 4.50 08/31/2012 1509   HGB 8.2* 08/31/2012 1509   HCT 28.7* 08/31/2012 1509   PLT 515* 08/31/2012 1509   MCV 63.8* 08/31/2012 1509   MCH 18.2* 08/31/2012 1509   MCHC 28.6* 08/31/2012 1509   RDW 18.5* 08/31/2012 1509   Gen: NAD, alert, cooperative with exam HEENT: NCAT Abd: SNTND, BS present, no guarding or organomegaly Neuro: Alert and oriented, No gross deficits GU: No obvious abnormalities on spec exam, min to mod amount of blood, bimanual with tenderness to palpation (esp on R) and enlarged uterus approx 10 week size with firmness specifically palpable on the R  Pelvic US 08/31/2012 IMPRESSION:  1. Bilateral hydro-pyo-salpinges. Pelvic inflammatory disease  cannot be excluded.  2. Nonvisualization of the ovaries, however no other adnexal  masses or free fluid identified.  3. Uterine fibroids, largest measuring 7.7 cm. Endometrium not  well visualized due to acoustic shadowing from fibroids.  GCC negative 08/31/2012     Assessment & Plan:  Dysmenorrhea, menorrhagia - secondary to fibroids  - pain meds helping, will  de-escalate to NSAIDs alone, Rx sent for 800 mg ibuprofen TID PRN - discussed that surgical options could be discussed if medical options were not successful - Will continue NSAIDs alone for now and follow up in 3 months, repeat CBC at that time.   Kevin Fenton 10/03/2012, 4:04 PM

## 2012-10-03 NOTE — Patient Instructions (Signed)
Follow up in 4 months  Fibromas (Fibroids) Los fibromas son bultos (tumores) que pueden Conservation officer, nature del cuerpo de Nurse, mental health. Estos tumores no son cancerosos. Pueden variar en tamao, peso y lugar en el que crecen. CUIDADOS EN EL HOGAR  No tome aspirina.  Anote el nmero de apsitos o tampones que [place] durante el perodo. Infrmelo a su mdico. Esto puede ayudar a determinar el mejor tratamiento para usted. SOLICITE AYUDA DE INMEDIATO SI:  Siente dolor en la zona inferior del vientre (abdomen) y no se alivia con analgsicos.  Tiene clicos que no se calman con medicamentos  Aumenta el sangrado entre perodos o durante el mismo.  Sufre mareos o se desvanece (se desmaya).  El dolor en el vientre [place]. ASEGRESE DE QUE:  Comprende estas instrucciones.  Controlar su enfermedad.  Solicitar ayuda de inmediato si no mejora o empeora. Document Released: 07/06/2010 Document Revised: 06/13/2011 [facility] Patient Information 2014 [place], [place].  months

## 2013-02-15 ENCOUNTER — Ambulatory Visit: Payer: No Typology Code available for payment source | Attending: Internal Medicine | Admitting: Internal Medicine

## 2013-02-15 ENCOUNTER — Encounter: Payer: Self-pay | Admitting: Internal Medicine

## 2013-02-15 VITALS — BP 120/84 | HR 66 | Resp 18 | Wt 187.0 lb

## 2013-02-15 DIAGNOSIS — D649 Anemia, unspecified: Secondary | ICD-10-CM | POA: Insufficient documentation

## 2013-02-15 DIAGNOSIS — N946 Dysmenorrhea, unspecified: Secondary | ICD-10-CM

## 2013-02-15 LAB — CBC
HCT: 31.5 % — ABNORMAL LOW (ref 36.0–46.0)
Hemoglobin: 9.4 g/dL — ABNORMAL LOW (ref 12.0–15.0)
WBC: 9 10*3/uL (ref 4.0–10.5)

## 2013-02-15 LAB — COMPLETE METABOLIC PANEL WITH GFR
AST: 19 U/L (ref 0–37)
Alkaline Phosphatase: 52 U/L (ref 39–117)
BUN: 12 mg/dL (ref 6–23)
GFR, Est Non African American: >89 mL/min
Glucose, Bld: 86 mg/dL (ref 70–99)
Sodium: 138 mEq/L (ref 135–145)
Total Bilirubin: 0.3 mg/dL (ref 0.3–1.2)
Total Protein: 7.2 g/dL (ref 6.0–8.3)

## 2013-02-15 LAB — POCT GLYCOSYLATED HEMOGLOBIN (HGB A1C): Hemoglobin A1C: 5.3

## 2013-02-15 MED ORDER — DOCUSATE SODIUM 100 MG PO CAPS: 100.0000 mg | ORAL_CAPSULE | Freq: Two times a day (BID) | ORAL | Status: DC

## 2013-02-15 NOTE — Progress Notes (Unsigned)
Pt is here for a f/u on her irregular menstrual cycles Reports her LMP was on 02/09/13 Hx of fibroids... Pain increases w/every menstrual cycle Alert w/no signs of acute distress.

## 2013-02-15 NOTE — Progress Notes (Unsigned)
Patient ID: Mariah Lang, female   DOB: [birth year], 42 y.o.   MRN: [id number]   Patient Demographics  Mariah Lang, is a 42 y.o. female  [id number]  [id number]  DOB - 08/01/1970  Chief Complaint  Patient presents with  . Follow-up        Subjective:   Mariah Lang with History of dysmenorrhea, iron deficiency anemia caused by menstrual blood loss from dysmenorrhea is here for routine followup visit, she is mildly constipated but other than that no subjective complaints, her periods continue to be irregular.  Denies any subjective complaints except as above, no active headache, no chest abdominal pain at this time, not short of breath. No focal weakness which is new.    Objective:    Patient Active Problem List   Diagnosis Date Noted  . Dysmenorrhea 02/15/2013  . Anemia due to Dysmenorrhea 02/15/2013     Filed Vitals:   02/15/13 1028  BP: 120/84  Pulse: 66  Resp: 18  Weight: 187 lb (84.823 kg)     Exam   Awake Alert, Oriented X 3, No new F.N deficits, Normal affect [place].AT,PERRAL Supple Neck,No JVD, No cervical lymphadenopathy appriciated.  Symmetrical Chest wall movement, Good air movement bilaterally, CTAB RRR,No Gallops,Rubs or new Murmurs, No Parasternal Heave +ve B.Sounds, Abd Soft, Non tender, No organomegaly appriciated, No rebound - guarding or rigidity. No Cyanosis, Clubbing or edema, No new Rash or bruise       Data Review   Lab Results  Component Value Date   WBC 9.1 08/31/2012   HGB 8.2* 08/31/2012   HCT 28.7* 08/31/2012   MCV 63.8* 08/31/2012   PLT 515* 08/31/2012      Chemistry      Component Value Date/Time   NA 141 04/19/2011 1214   K 3.8 04/19/2011 1214   CL 107 04/19/2011 1214   BUN 7 04/19/2011 1214   CREATININE 0.60 04/19/2011 1214   No results found for this basename: CALCIUM, ALKPHOS, AST, ALT, BILITOT       No results found for this basename: HGBA1C    No results found for this basename: CHOL, HDL,  LDLCALC, LDLDIRECT, TRIG, CHOLHDL    No results found for this basename: TSH    No results found for this basename: PSA      Prior to Admission medications   Medication Sig Start Date End Date Taking? Authorizing Provider  docusate sodium (COLACE) 100 MG capsule Take 1 capsule (100 mg total) by mouth 2 (two) times daily. 02/15/13   Leroy Sea, MD  doxycycline (VIBRAMYCIN) 50 MG capsule Take 2 capsules (100 mg total) by mouth 2 (two) times daily. 08/31/12   Archie Patten, CNM  ferrous sulfate 325 (65 FE) MG tablet Take 1 tablet (325 mg total) by mouth 2 (two) times daily. 08/31/12   Archie Patten, CNM  ibuprofen (ADVIL,MOTRIN) 800 MG tablet Take 1 tablet (800 mg total) by mouth every 8 (eight) hours as needed for pain (pain). 10/03/12   Elenora Gamma, MD  megestrol (MEGACE) 40 MG tablet 1 tab po tid until bleeding stops, then 1 tab daily until seen for follow up 08/31/12   Archie Patten, CNM  oxyCODONE-acetaminophen (PERCOCET/ROXICET) 5-325 MG per tablet Take 1 tablet by mouth every 4 (four) hours as needed for pain. 08/31/12   Archie Patten, CNM     Assessment & Plan    Ongoing severe dysmenorrhea. [facility] physician on a  regular basis, she is on a regimen of Megace, NSAIDs which she will continue. I have encouraged her to continue followup with her OB physician on a regular basis.   Iron deficiency anemia caused by dysmenorrhea and menstrual blood loss. Continue iron supplements, will repeat CBC and iron panel.   Morbid obesity. Counseled on diet and exercise.    Constipation likely combination of NSAIDs and iron supplement. Placed on Colace.     Routine health maintenance.  Screening labs. CBC, CMP, TSH, A1c, lipid panel ordered  Flu shot given      Leroy Sea M.D on 02/15/2013 at 10:31 AM    Patient was given clear instructions to go to ER or return to the clinic if symptoms don't  improve, worsen or new problems develop. Patient verbalized understanding. Patient was told to call to get lab results if hasn't heard anything in the next week.

## 2013-02-16 LAB — ANEMIA PANEL
ABS Retic: 66.2 10*3/uL (ref 19.0–186.0)
RBC.: 4.73 MIL/uL (ref 3.87–5.11)
TIBC: 400 ug/dL (ref 250–470)
UIBC: 385 ug/dL (ref 125–400)

## 2013-02-18 ENCOUNTER — Telehealth: Payer: Self-pay | Admitting: Emergency Medicine

## 2013-02-18 MED ORDER — FERROUS SULFATE 325 (65 FE) MG PO TABS: 325.0000 mg | ORAL_TABLET | Freq: Three times a day (TID) | ORAL | Status: DC

## 2013-02-18 NOTE — Telephone Encounter (Signed)
Pt given lab results with instructions on medication orders and repeat blood work per [employer] Verbalized understanding

## 2013-02-18 NOTE — Progress Notes (Signed)
Quick Note:  Please let patient know her INR levels are still low, I have increased her iron pills 3 times a day, prescriptions called to the pharmacy, she should come back here in 2 months for a repeat anemia panel ______

## 2013-02-18 NOTE — Telephone Encounter (Signed)
Message copied by Darlis Loan on Mon Feb 18, 2013  1:47 PM ------      Message from: [facility], Bess Harvest K      Created: Mon Feb 18, 2013  9:06 AM       Please let patient know her INR levels are still low, I have increased her iron pills 3 times a day, prescriptions called to the pharmacy, she should come back here in 2 months for a repeat anemia panel ------

## 2013-03-11 ENCOUNTER — Encounter: Payer: Self-pay | Admitting: Obstetrics & Gynecology

## 2013-03-11 ENCOUNTER — Ambulatory Visit (INDEPENDENT_AMBULATORY_CARE_PROVIDER_SITE_OTHER): Payer: No Typology Code available for payment source | Admitting: Obstetrics & Gynecology

## 2013-03-11 VITALS — BP 120/82 | HR 83 | Wt 184.6 lb

## 2013-03-11 DIAGNOSIS — D219 Benign neoplasm of connective and other soft tissue, unspecified: Secondary | ICD-10-CM

## 2013-03-11 DIAGNOSIS — Z0000 Encounter for general adult medical examination without abnormal findings: Secondary | ICD-10-CM

## 2013-03-11 DIAGNOSIS — D649 Anemia, unspecified: Secondary | ICD-10-CM

## 2013-03-11 DIAGNOSIS — N920 Excessive and frequent menstruation with regular cycle: Secondary | ICD-10-CM

## 2013-03-11 DIAGNOSIS — D259 Leiomyoma of uterus, unspecified: Secondary | ICD-10-CM

## 2013-03-11 MED ORDER — MISOPROSTOL 200 MCG PO TABS: ORAL_TABLET | ORAL | Status: DC

## 2013-03-11 MED ORDER — MEDROXYPROGESTERONE ACETATE 150 MG/ML IM SUSP: 150.0000 mg | Freq: Once | INTRAMUSCULAR | Status: DC

## 2013-03-11 NOTE — Progress Notes (Signed)
Patient states that she made this appt because she is having pelvic pain that is mostly left sided.She only has the pain on the first day of her period. She takes 800mg  ibuprofen and it doesn't help. She also has been taking three aleve for the pain. She didn't have period in October. Period in November was very heavy.

## 2013-03-11 NOTE — Progress Notes (Signed)
   Subjective:    Patient ID: Mariah Lang, female    DOB: [birth year], 42 y.o.   MRN: [id number]  HPI  42 yo S H P2 (22 and 16) here today as a follow up from the [facility] where she was seen for dysmenorrhea.  Review of Systems Last pap 2010 at [facility] Last mammogram 2013 She got the flu vaccine this year She has unprotected sex and would like another child but she has been told that she cannot have more children.  She did have some IUD in place for 13 years.    Objective:   Physical Exam  6 weeks size uterus       Assessment & Plan:  Menorrhagia probably due to her fibroids.  I will schedule her for a EMBX, pretreat with cytotec I have offered her Mirena, depo provera as treatment options for her menorrhagia. I have recommended that she continue BID iron

## 2013-03-11 NOTE — Patient Instructions (Signed)
Biopsia de endometrio  (Endometrial Biopsy)  La biopsia de endometrio es un procedimiento en el que se toma una muestra de tejido del útero. Luego la muestra de tejido se observa en el microscopio para ver si el tejido es normal o anormal. El endometrio es el revestimiento interno del útero. Este procedimiento ayuda a determinar si está en el ciclo menstrual y de que modo los niveles de hormonas afectan el revestimiento del útero. Este procedimiento también se usa para evaluar el sangrado uterino o para diagnosticar el cáncer de endometrio, tuberculosis, pólipos o enfermedades inflamatorias.   INFORME A SU MÉDICO:  · Cualquier alergia que tenga.  · Todos los medicamentos que utiliza, incluyendo vitaminas, hierbas, gotas oftálmicas, cremas y medicamentos de venta libre.  · Problemas previos que usted o los miembros de su familia hayan tenido con el uso de anestésicos.  · Enfermedades de la sangre.  · Cirugías previas.  · Padecimientos médicos.  · Posible embarazo.  RIESGOS Y COMPLICACIONES  Generalmente es un procedimiento seguro. Sin embargo, como en cualquier procedimiento, pueden surgir complicaciones. Las complicaciones posibles son:  · Hemorragias.  · Infecciones pélvicas  · Lesión en la pared del útero con el instrumento utilizado para tomar la biopsia (raro).  ANTES DEL PROCEDIMIENTO   · Lleve un registro de sus ciclos menstruales según las indicaciones de su médico. Puede ser necesario que programe el procedimiento para un momento específico del ciclo menstrual.  · Tendrá que llevar un apósito sanitario para usar después del procedimiento.  · Pídale a alguna persona que la lleve a su casa después del procedimiento si le dan un medicamento para relajarse (sedante).  PROCEDIMIENTO   · Le podrán administrar un medicamento para relajarse.  · Deberá recostarse en una camilla con los pies y las piernas elevados, como en el examen pélvico.  · El médico insertará un instrumento (espéculo) en la vagina para observar  el cuello del útero.  · El cuello del útero será desinfectado con una solución antiséptica. Para adormecer el cuello del útero le aplicarán un medicamento (anestésico local ).  · Se utilizará un fórceps (tenáculo) para mantener el cuello firme.  · Se insertará un instrumento delgado, similar a una varilla (sonda uterina) a través del cuello del útero para determinar su longitud y la ubicación en la que será tomada la muestra para la biopsia.  · Luego se pasa un tubo delgado y flexible (catéter) a través del cuello del útero hasta el útero. El catéter se utiliza para recolectar la muestra de tejido del endometrio para la biopsia.  · El catéter y el espéculo se retirarán y la muestra se enviará al laboratorio para ser examinada.  DESPUÉS DEL PROCEDIMIENTO  · Descansará en una sala de recuperación hasta que esté lista para volver a su casa.  · Sentirá cólicos leves y tendrá una pequeña cantidad de sangrado vaginal durante algunos días después del procedimiento. Esto es normal.  · Asegúrese de obtener los resultados.  Document Released: 11/21/2012  ExitCare® Patient Information ©2014 ExitCare, LLC.

## 2013-03-14 ENCOUNTER — Ambulatory Visit (HOSPITAL_COMMUNITY)
Admission: RE | Admit: 2013-03-14 | Discharge: 2013-03-14 | Disposition: A | Discharge status: Home / Self Care | Payer: No Typology Code available for payment source | Source: Ambulatory Visit | Attending: Obstetrics & Gynecology | Admitting: Obstetrics & Gynecology

## 2013-03-14 DIAGNOSIS — Z0000 Encounter for general adult medical examination without abnormal findings: Secondary | ICD-10-CM

## 2013-03-14 DIAGNOSIS — Z1231 Encounter for screening mammogram for malignant neoplasm of breast: Secondary | ICD-10-CM | POA: Insufficient documentation

## 2013-03-26 ENCOUNTER — Other Ambulatory Visit: Payer: Self-pay | Admitting: Obstetrics & Gynecology

## 2013-03-26 DIAGNOSIS — R928 Other abnormal and inconclusive findings on diagnostic imaging of breast: Secondary | ICD-10-CM

## 2013-04-03 ENCOUNTER — Ambulatory Visit
Admission: RE | Admit: 2013-04-03 | Discharge: 2013-04-03 | Disposition: A | Discharge status: Home / Self Care | Payer: No Typology Code available for payment source | Source: Ambulatory Visit | Attending: Obstetrics & Gynecology | Admitting: Obstetrics & Gynecology

## 2013-04-03 DIAGNOSIS — R928 Other abnormal and inconclusive findings on diagnostic imaging of breast: Secondary | ICD-10-CM

## 2013-04-10 ENCOUNTER — Emergency Department (HOSPITAL_COMMUNITY)
Admission: EM | Admit: 2013-04-10 | Discharge: 2013-04-10 | Disposition: A | Discharge status: Home / Self Care | Payer: PRIVATE HEALTH INSURANCE | Source: Home / Self Care | Attending: Family Medicine | Admitting: Family Medicine

## 2013-04-10 ENCOUNTER — Other Ambulatory Visit: Payer: No Typology Code available for payment source | Admitting: Obstetrics & Gynecology

## 2013-04-10 ENCOUNTER — Encounter (HOSPITAL_COMMUNITY): Payer: Self-pay | Admitting: Emergency Medicine

## 2013-04-10 DIAGNOSIS — J40 Bronchitis, not specified as acute or chronic: Secondary | ICD-10-CM

## 2013-04-10 LAB — POCT RAPID STREP A: Streptococcus, Group A Screen (Direct): NEGATIVE

## 2013-04-10 MED ORDER — AZITHROMYCIN 250 MG PO TABS: 250.0000 mg | ORAL_TABLET | Freq: Every day | ORAL | Status: DC

## 2013-04-10 MED ORDER — IPRATROPIUM BROMIDE 0.02 % IN SOLN
0.5000 mg | Freq: Once | RESPIRATORY_TRACT | Status: AC
  Administered 2013-04-10: 0.5 mg via RESPIRATORY_TRACT

## 2013-04-10 MED ORDER — IPRATROPIUM BROMIDE 0.02 % IN SOLN
RESPIRATORY_TRACT | Status: AC
  Filled 2013-04-10: qty 2.5

## 2013-04-10 MED ORDER — ALBUTEROL SULFATE (2.5 MG/3ML) 0.083% IN NEBU
INHALATION_SOLUTION | RESPIRATORY_TRACT | Status: AC
  Filled 2013-04-10: qty 6

## 2013-04-10 MED ORDER — PREDNISONE 10 MG PO TABS: 30.0000 mg | ORAL_TABLET | Freq: Every day | ORAL | Status: DC

## 2013-04-10 MED ORDER — IPRATROPIUM BROMIDE 0.06 % NA SOLN: 2.0000 | Freq: Four times a day (QID) | NASAL | Status: DC

## 2013-04-10 MED ORDER — ALBUTEROL SULFATE (5 MG/ML) 0.5% IN NEBU
5.0000 mg | INHALATION_SOLUTION | Freq: Once | RESPIRATORY_TRACT | Status: AC
  Administered 2013-04-10: 5 mg via RESPIRATORY_TRACT

## 2013-04-10 NOTE — Discharge Instructions (Signed)
Gracias por venir hoy.   Bronquitis (Bronchitis) La bronquitis es [employer] (el modo que tiene el organismo de Firefighter a una lesin o infeccin) de los bronquios Los bronquios son los conductos que se extienden desde la trquea [employer]. Si la inflamacin se agrava, puede causar la falta de aire. CAUSAS Las causas de la inflamacin pueden ser:  Un virus  Grmenes (bacteria).  Polvo  Alergenos  La polucin y muchos otros irritantes Las clulas que revisten el rbol bronquial estn cubiertas con pequeos pelos (cilias). Esta constantemente producen un movimiento desde los pulmones hacia la boca. De este modo se mantienen los pulmones libres de polucin. Cuando estas clulas se irritan y no pueden cumplir su funcin, comienza a formarse la mucosidad. Esto produce la caracterstica tos de la bronquitis. La tos es el mecanismo por el cual se limpian los pulmones cuando las cilias no pueden cumplir su funcin. Sin alguno de [employer], Agricultural engineer se Technical brewer los pulmones Entonces se desarrollara una pulmona.  El fumar es una de las causas ms frecuentes de bronquitis y puede contribuir a la neumona. Abandonar este hbito es lo ms importante que puede hacer para beneficiarse. TRATAMIENTO  El [employer] prescribir antibiticos si la causa es una bacteria, y medicamentos para abrir las vas areas y Best boy. Tambin puede recomendar o prescribir un expectorante. El expectorante aflojar la mucosidad para que pueda eliminarla. Slo tome medicamentos de Radio broadcast assistant o prescriptos para Glass blower/designer, las [place], o bajar la fiebre segn las indicaciones de su mdico.  Radiographer, therapeutic todo lo que causa el problema (por ejemplo el hbito de Copy) es fundamental para evitar que empeore.  Un antitusgeno puede prescribirse para [employer] de la tos.  Podrn indicarle inhalantes para aliviar los sntomas actuales y ayudar a prevenir  problemas futuros.  Aquellos que sufren bronquitis crnica (recurrente) puede ser necesaria la administracin de corticoides. SOLICITE ATENCIN MDICA INMEDIATAMENTE SI:  Durante el tratamiento observa que elimina esputo similar a pus (purulento).  Tiene fiebre.  Se siente cada vez ms enfermo.  Tiene cada vez ms dificultad para respirar, tiene ruidos al respirar o Risk manager. Es necesario buscar atencin mdica inmediata si es [place] persona de edad avanzada o sufre alguna otra enfermedad. ASEGURESE DE QUE:   Comprende estas instrucciones.  Controlar su enfermedad.  Solicitar ayuda inmediatamente si no mejora o si empeora. Document Released: 03/21/2005 Document Revised: 11/21/2012 [facility] Patient Information 2014 [place], [place].

## 2013-04-10 NOTE — ED Notes (Signed)
C/o cough, ST, body aches

## 2013-04-10 NOTE — ED Provider Notes (Signed)
Mariah Lang is a 43 y.o. female who presents to Urgent Care today for cough congestion for the last 2 weeks. Patient notes pleuritic pain with deep inspiration and coughing. She also notes wheezing. She's tried multiple over-the-counter medications have not helped. No nausea vomiting diarrhea fevers or chills. Patient feels well otherwise.   Past Medical History  Diagnosis Date  . Medical history non-contributory    History  Substance Use Topics  . Smoking status: Never Smoker   . Smokeless tobacco: Not on file  . Alcohol Use: No   ROS as above Medications reviewed. Current Facility-Administered Medications  Medication Dose Route Frequency Provider Last Rate Last Dose  . medroxyPROGESTERone (DEPO-PROVERA) injection 150 mg  150 mg Intramuscular Once Emily Filbert, MD       Current Outpatient Prescriptions  Medication Sig Dispense Refill  . azithromycin (ZITHROMAX) 250 MG tablet Take 1 tablet (250 mg total) by mouth daily. Take first 2 tablets together, then 1 every day until finished.  6 tablet  0  . docusate sodium (COLACE) 100 MG capsule Take 1 capsule (100 mg total) by mouth 2 (two) times daily.  30 capsule  5  . ferrous sulfate 325 (65 FE) MG tablet Take 1 tablet (325 mg total) by mouth 3 (three) times daily with meals.  60 tablet  11  . ibuprofen (ADVIL,MOTRIN) 800 MG tablet Take 1 tablet (800 mg total) by mouth every 8 (eight) hours as needed for pain (pain).  30 tablet  2  . ipratropium (ATROVENT) 0.06 % nasal spray Place 2 sprays into both nostrils 4 (four) times daily.  15 mL  1  . megestrol (MEGACE) 40 MG tablet 1 tab po tid until bleeding stops, then 1 tab daily until seen for follow up  45 tablet  1  . misoprostol (CYTOTEC) 200 MCG tablet Take 3 pills by mouth the night before biopsy.  3 tablet  0  . predniSONE (DELTASONE) 10 MG tablet Take 3 tablets (30 mg total) by mouth daily.  15 tablet  0    Exam:  BP 142/85  Pulse 94  Temp(Src) 98.5 F (36.9 C) (Oral)  Resp 18   SpO2 100%  LMP 03/05/2013 Gen: Well NAD HEENT: EOMI,  MMM posterior pharynx with cobblestoning. Membranes are normal appearing bilaterally Lungs: Normal work of breathing. Coarse breath sounds otherwise clear Heart: RRR no MRG Abd: NABS, Soft. NT, ND Exts: Non edematous BL  LE, warm and well perfused.   Patient was given a DuoNeb nebulizer treatment and had no significant improvement in symptoms  Results for orders placed during the hospital encounter of 04/10/13 (from the past 24 hour(s))  POCT RAPID STREP A ([place])     Status: None   Collection Time    04/10/13 11:02 AM      Result Value Range   Streptococcus, Group A Screen (Direct) NEGATIVE  NEGATIVE   No results found.  Assessment and Plan: 43 y.o. female with bronchitis. Plan treatment with azithromycin, prednisone, Atrovent nasal spray. Followup with primary care provider.   Discussed warning signs or symptoms. Please see discharge instructions. Patient expresses understanding.    Gregor Hams, MD 04/10/13 [phone]

## 2013-04-12 LAB — CULTURE, GROUP A STREP

## 2013-04-15 ENCOUNTER — Encounter (HOSPITAL_COMMUNITY): Payer: Self-pay | Admitting: Emergency Medicine

## 2013-04-15 ENCOUNTER — Emergency Department (HOSPITAL_COMMUNITY)
Admission: EM | Admit: 2013-04-15 | Discharge: 2013-04-15 | Disposition: A | Discharge status: Home / Self Care | Payer: No Typology Code available for payment source | Source: Home / Self Care | Attending: Family Medicine | Admitting: Family Medicine

## 2013-04-15 ENCOUNTER — Emergency Department (INDEPENDENT_AMBULATORY_CARE_PROVIDER_SITE_OTHER): Payer: No Typology Code available for payment source

## 2013-04-15 DIAGNOSIS — J40 Bronchitis, not specified as acute or chronic: Secondary | ICD-10-CM

## 2013-04-15 IMAGING — CR DG CHEST 2V
2 series · 2 of 2 positions shown · non-contrast
Comparison: None.

CLINICAL DATA: Cough

EXAM:
CHEST  2 VIEW

[view not recorded (1 of 2)]
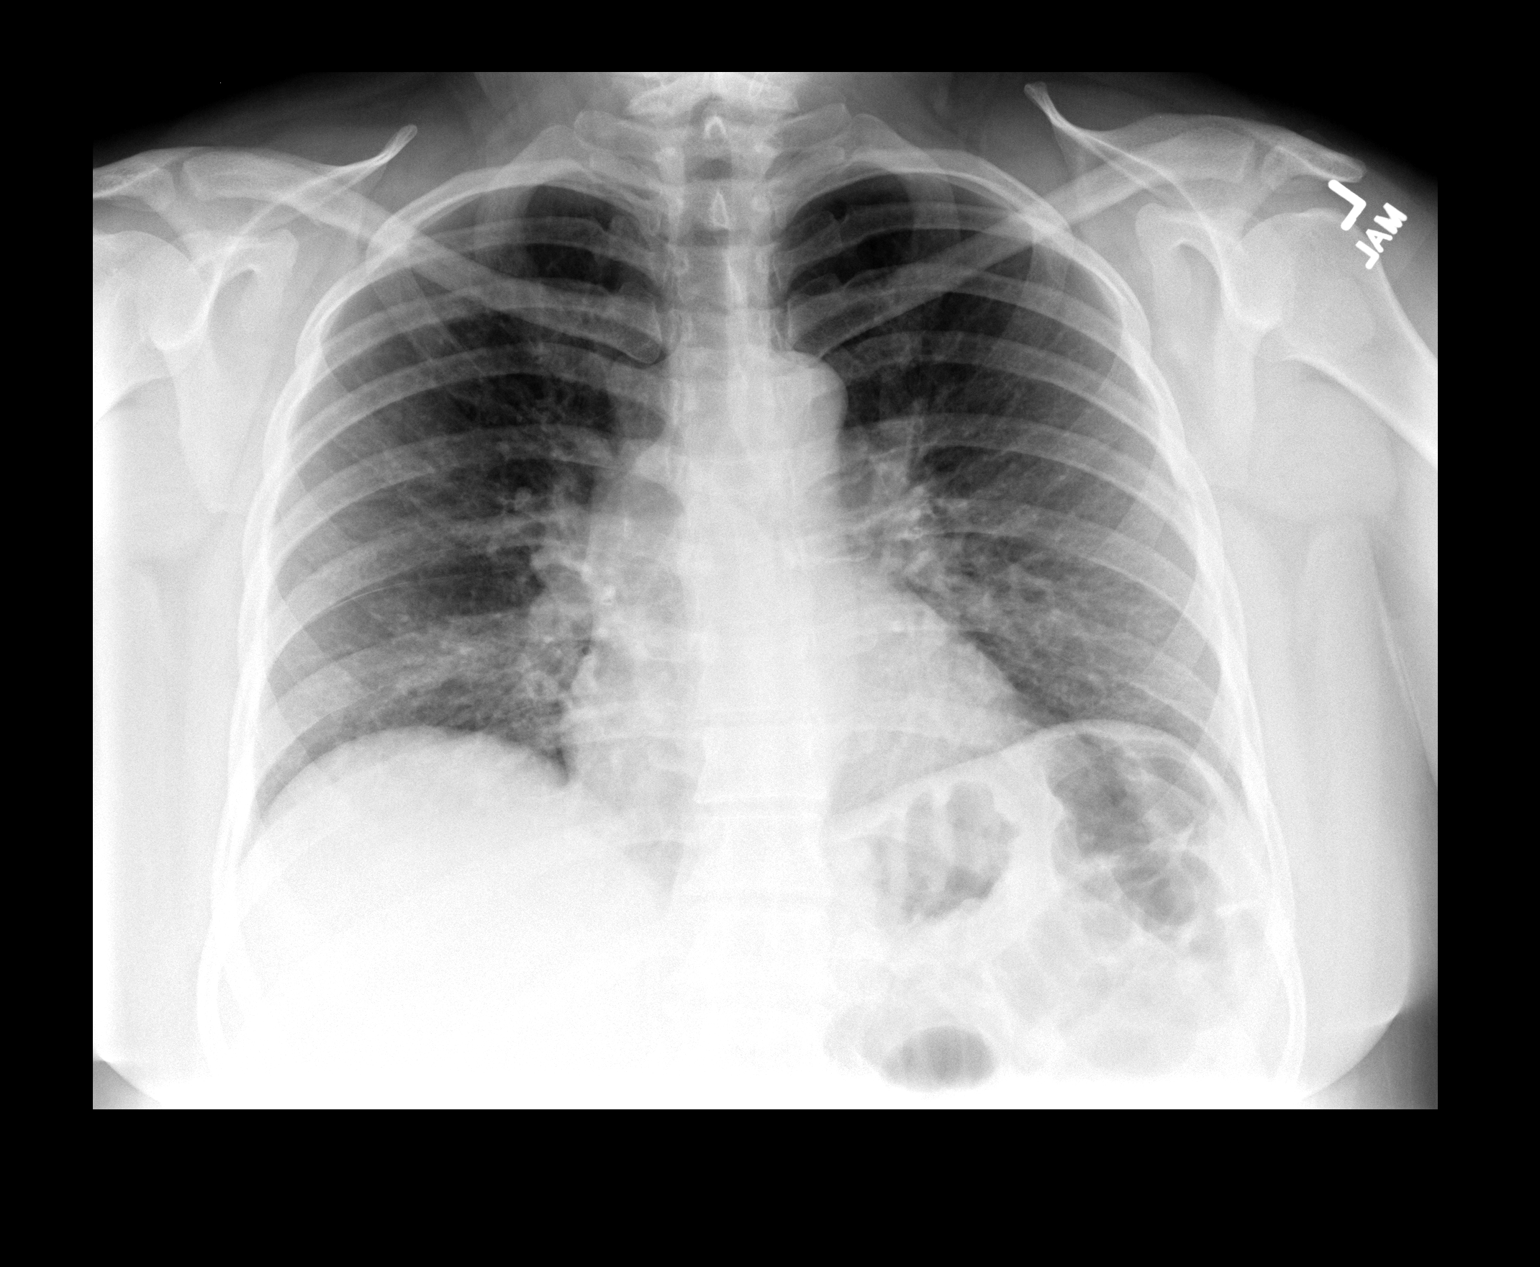

[view not recorded (2 of 2)]
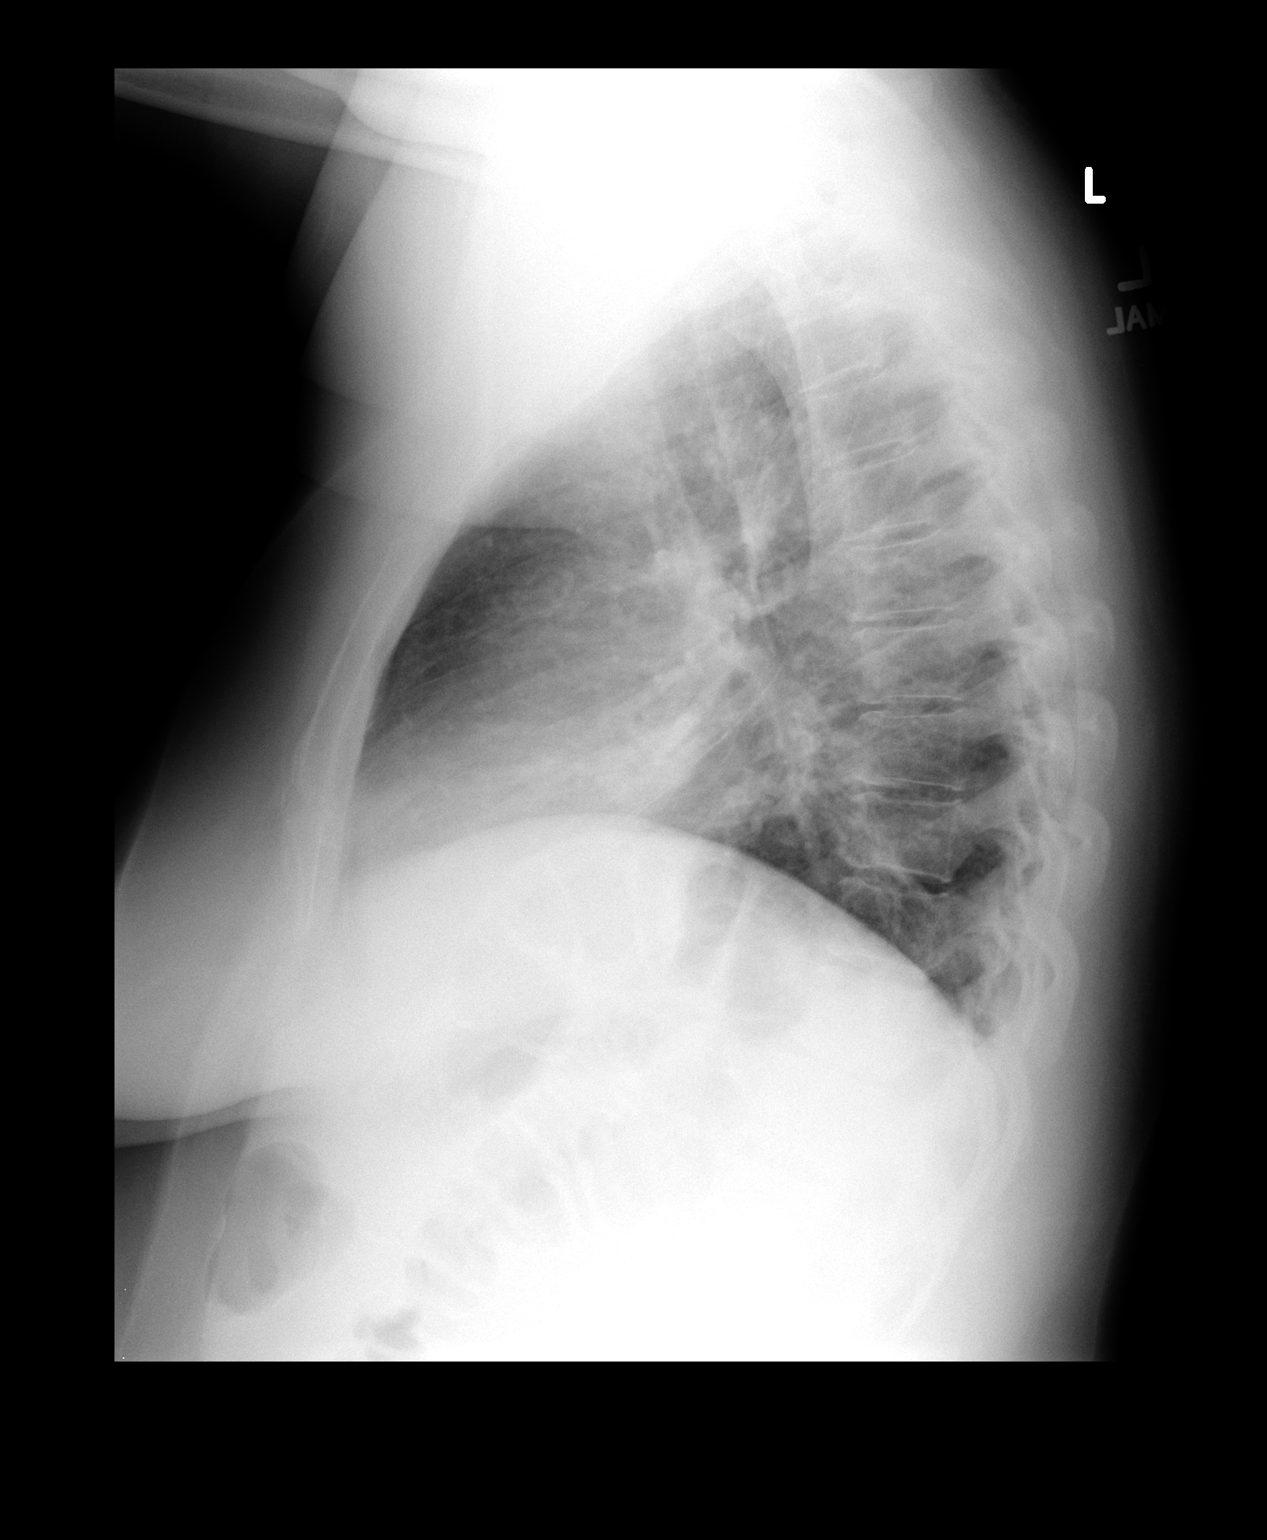

[2 of 2 positions shown; findings below may reference images not displayed]

FINDINGS: Lungs are clear. Heart size and pulmonary vascularity are normal. No
adenopathy. No pneumothorax. No bone lesions.
IMPRESSION: No edema or consolidation.

## 2013-04-15 MED ORDER — PREDNISONE 10 MG PO TABS: 20.0000 mg | ORAL_TABLET | Freq: Every day | ORAL | Status: DC

## 2013-04-15 MED ORDER — ALBUTEROL SULFATE HFA 108 (90 BASE) MCG/ACT IN AERS: 1.0000 | INHALATION_SPRAY | Freq: Four times a day (QID) | RESPIRATORY_TRACT | Status: DC | PRN

## 2013-04-15 MED ORDER — BENZONATATE 100 MG PO CAPS: 100.0000 mg | ORAL_CAPSULE | Freq: Three times a day (TID) | ORAL | Status: DC | PRN

## 2013-04-15 MED ORDER — GUAIFENESIN-CODEINE 100-10 MG/5ML PO SOLN: 10.0000 mL | Freq: Every evening | ORAL | Status: DC | PRN

## 2013-04-15 NOTE — Discharge Instructions (Signed)
Bronquitis (Bronchitis) La bronquitis es una inflamacin de las vas respiratorias que se extienden desde la trquea [facility] pulmones (bronquios). A menudo, la inflamacin produce la formacin de mucosidad, lo que genera tos. Si la inflamacin es grave, puede provocar falta de aire. CAUSAS  Las causas de la bronquitis pueden ser:   Infecciones virales.  Bacterias.  Humo del cigarrillo.  Alrgenos, contaminantes y otros irritantes. [place] sntoma ms habitual de la bronquitis es la tos frecuente con mucosidad. Otros sntomas son:  Cristy Hilts.  Dolores [employer] cuerpo.  Congestin en el pecho.  Escalofros.  Falta de aire.  Dolor de Investment banker, operational. DIAGNSTICO  La bronquitis en general se diagnostica con la historia clnica y un examen fsico. En algunos casos se indican otros estudios, como radiografas, para Clinical research associate.  TRATAMIENTO  Tal vez deba evitar el contacto con la causa del problema (por ejemplo, el cigarrillo). En algunos casos es Surveyor, quantity. Estos pueden ser:  Antibiticos. Tal vez se los receten si la causa de la bronquitis es una bacteria.  Antitusivos. Tal vez se los receten para [employer] sntomas de la tos.  Medicamentos inhalados. Tal vez se los receten para Financial controller las vas respiratorias y Museum/gallery exhibitions officer respiracin.  Medicamentos con corticoides. Tal vez se los receten si tiene bronquitis recurrente (crnica). INSTRUCCIONES PARA EL CUIDADO EN EL HOGAR  Descanse lo suficiente.  Beba lquidos en abundancia para mantener la orina de color claro o amarillo plido (excepto que padezca una enfermedad que requiera la restriccin de lquidos). Tome mucho lquido para Radiation protection practitioner las secreciones y Tree surgeon.  Tome solo medicamentos de venta libre o recetados, segn las [place] los antibiticos exclusivamente segn las indicaciones. Finalice la prescripcin completa, aunque se  sienta mejor.  Evite el humo de segunda mano, los qumicos irritantes y los vapores fuertes. Estos agentes empeoran la bronquitis. Si es fumador, abandone el hbito. Considere el uso de goma de Higher education careers adviser o la aplicacin de parches en la piel que contengan nicotina para aliviar los sntomas de abstinencia. Si deja de fumar, sus pulmones se curarn ms rpido.  Ponga un humidificador de vapor fro en la habitacin por la noche para humedecer el aire. Puede ayudarlo a aflojar la mucosidad. Cambie el agua del humidificador a diario. Tambin puede abrir el agua caliente de la ducha y sentarse en el bao con la puerta cerrada durante 5a78minutos.  Concurra a las consultas de control con su mdico segn las indicaciones.  Lvese las manos con frecuencia para evitar contagiarse bronquitis nuevamente y para no extender la infeccin a Producer, television/film/video. SOLICITE ATENCIN MDICA SI: Los sntomas no mejoran despus de 1 semana de tratamiento.  SOLICITE ATENCIN MDICA DE INMEDIATO SI:  La fiebre aumenta.  Tiene escalofros.  Siente dolor en el pecho.  Le empeora la falta el aire.  La flema tiene [place].  Se desmaya.  Tiene vahdos.  Sufre un dolor intenso de [place].  Vomita repetidas veces. ASEGRESE DE QUE:   Comprende estas instrucciones.  Controlar su afeccin.  Recibir ayuda de inmediato si no mejora o si empeora. Document Released: 03/21/2005 Document Revised: 01/09/2013 ExitCare Patient Information 2014 [place], [place]  Your xray was normal. Please use additional medications as prescribed for your cough. If symptoms do not improve, please follow up with your doctor.

## 2013-04-15 NOTE — ED Notes (Signed)
C/o persistent nonproductive cough with chest soreness and wheezing  X 15 days.   Vomiting, unable to keep anything down.   Denies fever and diarrhea.  No relief with otc meds.

## 2013-04-15 NOTE — ED Provider Notes (Signed)
CSN: [id number]     Arrival date & time 04/15/13  0932 History   First MD Initiated Contact with Patient 04/15/13 1014     Chief Complaint  Patient presents with  . Cough  . Emesis   (Consider location/radiation/quality/duration/timing/severity/associated sxs/prior Treatment) HPI Comments: Patient presents for evaluation of a cough with associated wheezing and occasional post tussive emesis that began 15 days ago. Was seen for same at [facility] on 04/10/2013 and prescribed atriovent nasal spray, prednisone and azithromycin. Patient states she has taken these medications as prescribed and has not had improvement. Denies fever, chills, night sweats or hemoptysis. States her main concern is the wheezing associated with her cough and generalized malaise. 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 Is a non-smoker. Denies chest pain at rest, states chest is only sore with coughing.   Patient is a 43 y.o. female presenting with cough and vomiting. The history is provided by the patient. The history is limited by a language barrier. A language interpreter was used.  Cough Associated symptoms: wheezing   Associated symptoms: no ear pain, no fever, no rhinorrhea, no shortness of breath and no sore throat   Emesis Associated symptoms: no abdominal pain, no diarrhea and no sore throat     Past Medical History  Diagnosis Date  . Medical history non-contributory    Past Surgical History  Procedure Laterality Date  . Other surgical history      c-section  . Appendectomy    . Cesarean section     History reviewed. No pertinent family history. History  Substance Use Topics  . Smoking status: Never Smoker   . Smokeless tobacco: Not on file  . Alcohol Use: No   OB History   Grav Para Term Preterm Abortions TAB SAB Ect Mult Living   2 2 2       2      Review of Systems  Constitutional: Positive for fatigue. Negative for fever.  HENT: Positive for congestion. Negative for ear pain, postnasal  drip, rhinorrhea and sore throat.   Eyes: Negative.   Respiratory: Positive for cough and wheezing. Negative for chest tightness and shortness of breath.   Cardiovascular: Negative.   Gastrointestinal: Positive for vomiting. Negative for abdominal pain, diarrhea and constipation.  Endocrine: Negative for polydipsia, polyphagia and polyuria.  Genitourinary: Negative.   Musculoskeletal: Negative.   Skin: Negative.   Allergic/Immunologic: Negative for immunocompromised state.  Neurological: Negative.   Hematological: Negative for adenopathy.  Psychiatric/Behavioral: Negative.     Allergies  Review of patient's allergies indicates no known allergies.  Home Medications   Current Outpatient Rx  Name  Route  Sig  Dispense  Refill  . azithromycin (ZITHROMAX) 250 MG tablet   Oral   Take 1 tablet (250 mg total) by mouth daily. Take first 2 tablets together, then 1 every day until finished.   6 tablet   0   . docusate sodium (COLACE) 100 MG capsule   Oral   Take 1 capsule (100 mg total) by mouth 2 (two) times daily.   30 capsule   5   . ferrous sulfate 325 (65 FE) MG tablet   Oral   Take 1 tablet (325 mg total) by mouth 3 (three) times daily with meals.   60 tablet   11   . ibuprofen (ADVIL,MOTRIN) 800 MG tablet   Oral   Take 1 tablet (800 mg total) by mouth every 8 (eight) hours as needed for pain (pain).   30 tablet  2   . ipratropium (ATROVENT) 0.06 % nasal spray   Each Nare   Place 2 sprays into both nostrils 4 (four) times daily.   15 mL   1   . megestrol (MEGACE) 40 MG tablet      1 tab po tid until bleeding stops, then 1 tab daily until seen for follow up   45 tablet   1   . misoprostol (CYTOTEC) 200 MCG tablet      Take 3 pills by mouth the night before biopsy.   3 tablet   0   . predniSONE (DELTASONE) 10 MG tablet   Oral   Take 3 tablets (30 mg total) by mouth daily.   15 tablet   0    BP 127/82  Pulse 74  Temp(Src) 98 F (36.7 C) (Oral)  Resp  16  SpO2 100%  LMP 04/03/2013 Physical Exam  Nursing note and vitals reviewed. Constitutional: She is oriented to person, place, and time. She appears well-developed and well-nourished. No distress.  HENT:  Head: Normocephalic and atraumatic.  Nose: Nose normal.  Mouth/Throat: Oropharynx is clear and moist.  Eyes: Conjunctivae are normal. Right eye exhibits no discharge. Left eye exhibits no discharge. No scleral icterus.  Neck: Normal range of motion. Neck supple.  Cardiovascular: Normal rate, regular rhythm and normal heart sounds.   Pulmonary/Chest: Effort normal. No respiratory distress. She has no rales. She exhibits no tenderness.  +mild end expiratory wheezes  Abdominal: Soft. Bowel sounds are normal. She exhibits no distension. There is no tenderness.  Musculoskeletal: Normal range of motion. She exhibits no edema and no tenderness.  Lymphadenopathy:    She has no cervical adenopathy.  Neurological: She is alert and oriented to person, place, and time.  Skin: Skin is warm and dry.  Psychiatric: She has a normal mood and affect. Her behavior is normal.    ED Course  Procedures (including critical care time) Labs Review Labs Reviewed - No data to display Imaging Review No results found.  EKG Interpretation    Date/Time:    Ventricular Rate:    PR Interval:    QRS Duration:   QT Interval:    QTC Calculation:   R Axis:     Text Interpretation:              MDM  CXR normal. Will extend prednisone taper, add albuterol MDI along with tessalon for daytime cough suppression and robitussin AC for nighttime cough suppression and advise PCP follow up if no improvement  [place], [place] 04/15/13 1114

## 2013-04-16 NOTE — ED Provider Notes (Signed)
Medical screening examination/treatment/procedure(s) were performed by a resident physician or non-physician practitioner and as the supervising physician I was immediately available for consultation/collaboration.  Lynne Leader, MD    Gregor Hams, MD 04/16/13 [phone]

## 2013-04-19 ENCOUNTER — Other Ambulatory Visit: Payer: Self-pay | Admitting: Internal Medicine

## 2013-04-19 ENCOUNTER — Other Ambulatory Visit: Payer: No Typology Code available for payment source

## 2013-04-19 LAB — CBC
HEMATOCRIT: 36.9 % (ref 36.0–46.0)
Hemoglobin: 11.3 g/dL — ABNORMAL LOW (ref 12.0–15.0)
MCH: 21.8 pg — ABNORMAL LOW (ref 26.0–34.0)
MCHC: 30.6 g/dL (ref 30.0–36.0)
MCV: 71.2 fL — AB (ref 78.0–100.0)
Platelets: 535 10*3/uL — ABNORMAL HIGH (ref 150–400)
RBC: 5.18 MIL/uL — ABNORMAL HIGH (ref 3.87–5.11)
RDW: 23.4 % — AB (ref 11.5–15.5)
WBC: 11.3 10*3/uL — ABNORMAL HIGH (ref 4.0–10.5)

## 2013-04-20 LAB — ANEMIA PANEL
%SAT: 33 % (ref 20–55)
ABS Retic: 93.2 10*3/uL (ref 19.0–186.0)
Ferritin: 14 ng/mL (ref 10–291)
Folate: 16.6 ng/mL
IRON: 104 ug/dL (ref 42–145)
RBC.: 5.18 MIL/uL — ABNORMAL HIGH (ref 3.87–5.11)
Retic Ct Pct: 1.8 % (ref 0.4–2.3)
TIBC: 314 ug/dL (ref 250–470)
UIBC: 210 ug/dL (ref 125–400)
Vitamin B-12: 792 pg/mL (ref 211–911)

## 2013-04-20 LAB — COMPLETE METABOLIC PANEL WITH GFR
ALT: 20 U/L (ref 0–35)
AST: 17 U/L (ref 0–37)
Albumin: 3.8 g/dL (ref 3.5–5.2)
Alkaline Phosphatase: 41 U/L (ref 39–117)
BILIRUBIN TOTAL: 0.3 mg/dL (ref 0.3–1.2)
BUN: 13 mg/dL (ref 6–23)
CO2: 26 mEq/L (ref 19–32)
Calcium: 9.1 mg/dL (ref 8.4–10.5)
Chloride: 103 mEq/L (ref 96–112)
Creat: 0.76 mg/dL (ref 0.50–1.10)
GFR, Est African American: >89 mL/min
GFR, Est Non African American: >89 mL/min
Glucose, Bld: 65 mg/dL — ABNORMAL LOW (ref 70–99)
Potassium: 4.4 mEq/L (ref 3.5–5.3)
Sodium: 135 mEq/L (ref 135–145)
TOTAL PROTEIN: 6.8 g/dL (ref 6.0–8.3)

## 2013-04-20 LAB — TSH: TSH: 1.105 u[IU]/mL (ref 0.350–4.500)

## 2013-04-20 NOTE — Progress Notes (Signed)
Quick Note:  Please have the patient come back to discuss abnormal CBC and see an MD ______

## 2013-04-22 ENCOUNTER — Telehealth: Payer: Self-pay | Admitting: Emergency Medicine

## 2013-04-22 NOTE — Telephone Encounter (Signed)
Message copied by Ricci Barker on Mon Apr 22, 2013  9:29 AM ------      Message from: [facility]      Created: Sat Apr 20, 2013  8:06 PM       Please have the patient come back to discuss abnormal CBC and see an MD ------

## 2013-04-22 NOTE — Telephone Encounter (Signed)
Left message for pt to call clinic

## 2013-07-10 ENCOUNTER — Encounter (HOSPITAL_COMMUNITY): Payer: Self-pay | Admitting: Emergency Medicine

## 2013-07-10 ENCOUNTER — Emergency Department (HOSPITAL_COMMUNITY)
Admission: EM | Admit: 2013-07-10 | Discharge: 2013-07-10 | Disposition: A | Discharge status: Home / Self Care | Payer: No Typology Code available for payment source | Source: Home / Self Care | Attending: Family Medicine | Admitting: Family Medicine

## 2013-07-10 DIAGNOSIS — H1010 Acute atopic conjunctivitis, unspecified eye: Secondary | ICD-10-CM

## 2013-07-10 DIAGNOSIS — J309 Allergic rhinitis, unspecified: Secondary | ICD-10-CM

## 2013-07-10 DIAGNOSIS — H1013 Acute atopic conjunctivitis, bilateral: Secondary | ICD-10-CM

## 2013-07-10 MED ORDER — OLOPATADINE HCL 0.2 % OP SOLN: 1.0000 [drp] | Freq: Every day | OPHTHALMIC | Status: DC

## 2013-07-10 MED ORDER — FLUTICASONE PROPIONATE 50 MCG/ACT NA SUSP: 2.0000 | Freq: Every day | NASAL | Status: DC

## 2013-07-10 NOTE — ED Provider Notes (Signed)
Medical screening examination/treatment/procedure(s) were performed by resident physician or non-physician practitioner and as supervising physician I was immediately available for consultation/collaboration.   Pauline Good MD.   Billy Fischer, MD 07/10/13 2121

## 2013-07-10 NOTE — Discharge Instructions (Signed)
Conjuntivitis alérgica  (Allergic Conjunctivitis)  La conjuntiva es una delgada membrana mucosa (secreción) que cubre la parte visible del globo ocular y la parte interior de los párpados. Esta membrana protege y lubrica el ojo. La membrana tiene pequeños vasos sanguíneos que pueden verse normalmente. Cuando la conjuntiva se inflama, produce un trastorno denominado conjuntivitis. En respuesta a la inflamación, los vasos sanguíneos de la conjuntiva se hinchan. La hinchazón da como resultado enrojecimiento de la zona del ojo que normalmente es blanca.  Cuando una persona es alérgica esta membrana reacciona, y por lo tanto a este trastorno se lo denomina conjuntivitis alérgica. El problema generalmente dura mientras persiste la alergia. La conjuntivitis alérgica no se transmite a otras personas (no es contagiosa). La probabilidad de infección bacteriana es grande y probablemente no se deba a una alergia si en el ojo inflamado se observa:  · Una secreción pegajosa.  · Secreción o pegoteo de las pestañas por la mañana.  · Escamas en los párpados, en la zona en que se implantan las pestañas.  · Hinchazón y enrojecimiento de los párpados  CAUSAS  · Virus.  · Irritantes, como cuerpos extraños.  · Sustancias químicas.  · Reacciones alérgicas.  · Inflamación o enfermedades graves de la zona interior o exterior del ojo o de la órbita (la cavidad ósea en la que el ojo se inserta) pueden causar un "ojo rojo".  SÍNTOMAS  · Enrojecimiento ocular.  · Lagrimeo.  · Picazón.  · Sensación de ardor.  · Secreción acuosa.  · Reacción alérgica debido al polen o sensibilidad a la ambrosía. La conjuntivitis alérgica estacional es frecuente en primavera, cuando el polen se encuentra en el aire y también en otoño.  DIAGNÓSTICO  Este trastorno, en sus variadas formas, se diagnostica según la historia clínica y el examen oftalmológico. Generalmente implica ambos ojos Si sus ojos reaccionan al mismo tiempo todos los años, la causa podría ser una  alergia. La mayoría de los casos de enrojecimiento ocular se deben a una reacción alérgica o una infección, pero es muy importante el diagnóstico oftalmológico. El examen puede descartar enfermedades graves del ojo o de la órbita.  TRATAMIENTO  · Podrán prescribirle gotas oftálmicas sin antibiótico, ungüentos o medicamentos por vía oral, si el oftalmólogo está seguro que la conjuntivitis sólo se debe a una alergia.  · Las gotas y ungüentos de venta libre para los síntomas alérgicos deben usarse sólo después que se hayan descartado otras causas de conjuntivitis, o según las indicaciones del médico.  Los medicamentos por boca generalmente se utilizan si también existen otros problemas alérgicos. Si el oftalmólogo está seguro que el medicamento se debe sólo a una alergia, el tratamiento se limita a gotas o ungüentos para reducir la picazón o el ardor.  INSTRUCCIONES PARA EL CUIDADO DOMICILIARIO  · Lávese las manos antes y después de aplicar gotas o ungüentos, o de tocarse el ojo inflamado o los párpados.  · No deje que la punta del gotero o del tubo del ungüento toque el párpado al colocar el medicamento en el ojo.  · Suspenda el uso de las lentes de contacto blandas y descártelas. Use un nuevo par de lentes cuando se haya recuperado completamente. Si va a utilizar nuevamente las mismas lentes de contacto, complete los ciclos de esterilización al menos tres veces. Debe suspender el uso de las lentes de contacto duras. Deben ser cuidadosamente esterilizadas antes del uso, luego de la recuperación.  · La picazón y el ardor debido a alergias se alivia colocando   un paño frío sobre el ojo cerrado.  SOLICITE ATENCIÓN MÉDICA SI:   · Los problemas no desaparecen luego de dos o tres días de tratamiento.  · Sus párpados están pegajosos (especialmente en la mañana al despertarse), o pegados.  · Tiene secreciones. Podrá ser necesaria la administración de antibióticos en forma de gotas, ungüentos o por vía oral.  · Tiene extrema  sensibilidad a la luz.  · Presenta una temperatura oral superior a 38,9° C (102° F).  · Siente dolor alrededor del ojo o desarrolla algún otro síntoma visual.  ESTÉ SEGURO QUE:   · Comprende las instrucciones para el alta médica.  · Controlará su enfermedad.  · Solicitará atención médica de inmediato según las indicaciones.  Document Released: 03/21/2005 Document Revised: 06/13/2011  [employer]® Patient Information ©2014 [employer].  Rinitis alérgica  (Allergic Rhinitis)  La rinitis alérgica ocurre cuando las membranas mucosas de la nariz responden a los alérgenos. Los alérgenos son las partículas que están en el aire y que hacen que el cuerpo tenga una reacción alérgica. Esto hace que usted libere anticuerpos alérgicos. A través de una cadena de eventos, estos finalmente hacen que usted libere histamina en la corriente sanguínea. Aunque la función de la histamina es proteger al organismo, es esta liberación de histamina lo que provoca malestar, como los estornudos frecuentes, la congestión y goteo y picazón nasales.   CAUSAS   La causa de la rinitis alérgica estacional (fiebre del heno) son los alérgenos del polen que pueden provenir del césped, los árboles y la maleza. La causa de la rinitis alérgica permanente (rinitis alérgica perenne) son los alérgenos como los ácaros del polvo doméstico, la caspa de las mascotas y las esporas del moho.   SÍNTOMAS   · Secreción nasal (congestión).  · Goteo y picazón nasales con estornudos y lagrimeo.  DIAGNÓSTICO   Su médico puede ayudarlo a determinar el alérgeno o los alérgenos que desencadenan sus síntomas. Si usted y su médico no pueden determinar cuál es el alérgeno, pueden hacerse análisis de sangre o estudios de la piel.  TRATAMIENTO   La rinitis alérgica no tiene cura, pero puede controlarse mediante lo siguiente:  · Medicamentos y vacunas contra la alergia (inmunoterapia).  · Prevención del alérgeno.  La fiebre del heno a menudo puede tratarse con antihistamínicos en  las formas de píldoras o aerosol nasal. Los antihistamínicos bloquean los efectos de la histamina. Existen medicamentos de venta libre que pueden ayudar con la congestión nasal y la hinchazón alrededor de los ojos. Consulte a su médico antes de tomar o administrarse este medicamento.   Si la prevención del alérgeno o el medicamento recetado no dan resultado, existen muchos medicamentos nuevos que su médico puede recetarle. Pueden usarse medicamentos más fuertes si las medidas iniciales no son efectivas. Pueden aplicarse inyecciones desensibilizantes si los medicamentos y la prevención no funcionan. La desensibilización ocurre cuando un paciente recibe vacunas constantes hasta que el cuerpo se vuelve menos sensible al alérgeno. Asegúrese de realizar un seguimiento con su médico si los problemas continúan.  INSTRUCCIONES PARA EL CUIDADO EN EL HOGAR  No es posible evitar por completo los alérgenos, pero puede reducir los síntomas al tomar medidas para limitar su exposición a ellos. Es muy útil saber exactamente a qué es alérgico para que pueda evitar sus desencadenantes específicos.  SOLICITE ATENCIÓN MÉDICA SI:   · Tiene fiebre.  · Desarrolla una tos que no se detiene fácilmente (persistente).  · Le falta el aire.  · Comienza a tener sibilancias.  ·

## 2013-07-10 NOTE — ED Notes (Signed)
C/o swelling under both her eyes onset Monday. C/o eyes itching and runny nose.

## 2013-07-10 NOTE — ED Provider Notes (Signed)
CSN: [id number]     Arrival date & time 07/10/13  1819 History   First MD Initiated Contact with Patient 07/10/13 1955     No chief complaint on file.  (Consider location/radiation/quality/duration/timing/severity/associated sxs/prior Treatment) HPI Comments: Reports 3 days of itchy/irritated red eyes with associated nasal congestion and rhinorrhea. No fever or eye pain. No contact lens use. Has tried using OTC allergy eye drops with minimal relief. No changes in vision. States right eye feels "swollen."  The history is provided by the patient.    Past Medical History  Diagnosis Date  . Medical history non-contributory    Past Surgical History  Procedure Laterality Date  . Other surgical history      c-section  . Appendectomy    . Cesarean section     No family history on file. History  Substance Use Topics  . Smoking status: Never Smoker   . Smokeless tobacco: Not on file  . Alcohol Use: No   OB History   Grav Para Term Preterm Abortions TAB SAB Ect Mult Living   2 2 2       2      Review of Systems  All other systems reviewed and are negative.   Allergies  Review of patient's allergies indicates no known allergies.  Home Medications   Current Outpatient Rx  Name  Route  Sig  Dispense  Refill  . albuterol (PROVENTIL HFA;VENTOLIN HFA) 108 (90 BASE) MCG/ACT inhaler   Inhalation   Inhale 1-2 puffs into the lungs every 6 (six) hours as needed for wheezing or shortness of breath.   1 Inhaler   0   . azithromycin (ZITHROMAX) 250 MG tablet   Oral   Take 1 tablet (250 mg total) by mouth daily. Take first 2 tablets together, then 1 every day until finished.   6 tablet   0   . benzonatate (TESSALON) 100 MG capsule   Oral   Take 1 capsule (100 mg total) by mouth 3 (three) times daily as needed for cough. Swallow whole   21 capsule   0   . docusate sodium (COLACE) 100 MG capsule   Oral   Take 1 capsule (100 mg total) by mouth 2 (two) times daily.   30 capsule    5   . ferrous sulfate 325 (65 FE) MG tablet   Oral   Take 1 tablet (325 mg total) by mouth 3 (three) times daily with meals.   60 tablet   11   . fluticasone (FLONASE) 50 MCG/ACT nasal spray   Each Nare   Place 2 sprays into both nostrils daily.   16 g   1   . guaiFENesin-codeine 100-10 MG/5ML syrup   Oral   Take 10 mLs by mouth at bedtime as needed for cough.   70 mL   0   . ibuprofen (ADVIL,MOTRIN) 800 MG tablet   Oral   Take 1 tablet (800 mg total) by mouth every 8 (eight) hours as needed for pain (pain).   30 tablet   2   . ipratropium (ATROVENT) 0.06 % nasal spray   Each Nare   Place 2 sprays into both nostrils 4 (four) times daily.   15 mL   1   . megestrol (MEGACE) 40 MG tablet      1 tab po tid until bleeding stops, then 1 tab daily until seen for follow up   45 tablet   1   . misoprostol (CYTOTEC) 200 MCG tablet  Take 3 pills by mouth the night before biopsy.   3 tablet   0   . Olopatadine HCl 0.2 % SOLN   Ophthalmic   Apply 1 drop to eye daily. 1 drop in each eye once a day   2.5 mL   1   . predniSONE (DELTASONE) 10 MG tablet   Oral   Take 3 tablets (30 mg total) by mouth daily.   15 tablet   0   . predniSONE (DELTASONE) 10 MG tablet   Oral   Take 2 tablets (20 mg total) by mouth daily. 2 po QD day 1, 2 po QD day 2, 1 po QD day 3 & 4, 1/2 po QD days 5 & 6 then stop   7 tablet   0    BP 120/83  Pulse 76  Temp(Src) 98.3 F (36.8 C) (Oral)  SpO2 99% Physical Exam  Nursing note and vitals reviewed. Constitutional: She is oriented to person, place, and time. She appears well-developed and well-nourished.  HENT:  Head: Normocephalic and atraumatic.  Right Ear: Hearing, tympanic membrane, external ear and ear canal normal.  Left Ear: Hearing, tympanic membrane, external ear and ear canal normal.  Nose: Mucosal edema and rhinorrhea present.  Mouth/Throat: Uvula is midline, oropharynx is clear and moist and mucous membranes are normal.   Eyes: EOM and lids are normal. Pupils are equal, round, and reactive to light. Right conjunctiva is injected. Left conjunctiva is injected.  Neck: Normal range of motion. Neck supple.  Cardiovascular: Normal rate, regular rhythm and normal heart sounds.   Pulmonary/Chest: Effort normal and breath sounds normal.  Musculoskeletal: Normal range of motion.  Lymphadenopathy:    She has no cervical adenopathy.  Neurological: She is alert and oriented to person, place, and time.  Skin: Skin is warm and dry. No rash noted.  Psychiatric: She has a normal mood and affect. Her behavior is normal.    ED Course  Procedures (including critical care time) Labs Review Labs Reviewed - No data to display Imaging Review No results found.   MDM   1. Allergic conjunctivitis of both eyes and rhinitis    Pataday opth. Drops as prescribed. Flonase for nasal congestion/allergy. Follow up with PCP if no improvement.   [place], [place] 07/10/13 2019

## 2014-02-03 ENCOUNTER — Encounter (HOSPITAL_COMMUNITY): Payer: Self-pay | Admitting: Emergency Medicine

## 2014-07-23 ENCOUNTER — Encounter (HOSPITAL_COMMUNITY): Payer: Self-pay | Admitting: Emergency Medicine

## 2014-07-23 ENCOUNTER — Emergency Department (HOSPITAL_COMMUNITY)
Admission: EM | Admit: 2014-07-23 | Discharge: 2014-07-23 | Disposition: A | Discharge status: Home / Self Care | Payer: No Typology Code available for payment source | Source: Home / Self Care | Attending: Family Medicine | Admitting: Family Medicine

## 2014-07-23 DIAGNOSIS — N390 Urinary tract infection, site not specified: Secondary | ICD-10-CM

## 2014-07-23 LAB — POCT URINALYSIS DIP (DEVICE)
Bilirubin Urine: NEGATIVE
Glucose, UA: NEGATIVE mg/dL
Ketones, ur: NEGATIVE mg/dL
NITRITE: NEGATIVE
Protein, ur: 30 mg/dL — AB
SPECIFIC GRAVITY, URINE: 1.025 (ref 1.005–1.030)
Urobilinogen, UA: 0.2 mg/dL (ref 0.0–1.0)
pH: 6 (ref 5.0–8.0)

## 2014-07-23 LAB — POCT PREGNANCY, URINE: Preg Test, Ur: NEGATIVE

## 2014-07-23 MED ORDER — PHENAZOPYRIDINE HCL 200 MG PO TABS: 200.0000 mg | ORAL_TABLET | Freq: Three times a day (TID) | ORAL | Status: DC

## 2014-07-23 MED ORDER — CEPHALEXIN 500 MG PO CAPS: 1000.0000 mg | ORAL_CAPSULE | Freq: Two times a day (BID) | ORAL | Status: DC

## 2014-07-23 NOTE — ED Notes (Signed)
C/o UTI sx onset 3-4 days Sx include hematuria, dysuria, lower abd pain, urinary freq/urgency Denies fevers, chills Taking naproxen w/temp relief Alert, no signs of acute distress.

## 2014-07-23 NOTE — ED Provider Notes (Signed)
CSN: [id number]     Arrival date & time 07/23/14  0804 History   First MD Initiated Contact with Patient 07/23/14 [phone]     Chief Complaint  Patient presents with  . Urinary Tract Infection   (Consider location/radiation/quality/duration/timing/severity/associated sxs/prior Treatment) HPI        44 year old female presents complaining of lower abdominal pain, dysuria, gross hematuria. This started 3-4 days ago and has gotten gradually worse. She denies fever, chills, NVD, flank pain. She has no history of UTIs. She has no recent hospitalizations or catheterizations.  Past Medical History  Diagnosis Date  . Medical history non-contributory    Past Surgical History  Procedure Laterality Date  . Other surgical history  1993    c-section  . Appendectomy    . Cesarean section  2000   No family history on file. History  Substance Use Topics  . Smoking status: Never Smoker   . Smokeless tobacco: Not on file  . Alcohol Use: No   OB History    Gravida Para Term Preterm AB TAB SAB Ectopic Multiple Living   2 2 2       2      Review of Systems  Constitutional: Negative for fever and chills.  Gastrointestinal: Positive for abdominal pain. Negative for nausea, vomiting and diarrhea.  Genitourinary: Positive for dysuria, urgency, frequency, hematuria and vaginal pain. Negative for flank pain.  All other systems reviewed and are negative.   Allergies  Review of patient's allergies indicates no known allergies.  Home Medications   Prior to Admission medications   Medication Sig Start Date End Date Taking? Authorizing Provider  albuterol (PROVENTIL HFA;VENTOLIN HFA) 108 (90 BASE) MCG/ACT inhaler Inhale 1-2 puffs into the lungs every 6 (six) hours as needed for wheezing or shortness of breath. 04/15/13   Audelia Hives Presson, PA  azithromycin (ZITHROMAX) 250 MG tablet Take 1 tablet (250 mg total) by mouth daily. Take first 2 tablets together, then 1 every day until finished. 04/10/13    Gregor Hams, MD  benzonatate (TESSALON) 100 MG capsule Take 1 capsule (100 mg total) by mouth 3 (three) times daily as needed for cough. Swallow whole 04/15/13   Audelia Hives Presson, PA  cephALEXin (KEFLEX) 500 MG capsule Take 2 capsules (1,000 mg total) by mouth 2 (two) times daily. 07/23/14   Freeman Caldron Yaretsi Humphres, PA-C  docusate sodium (COLACE) 100 MG capsule Take 1 capsule (100 mg total) by mouth 2 (two) times daily. 02/15/13   Thurnell Lose, MD  ferrous sulfate 325 (65 FE) MG tablet Take 1 tablet (325 mg total) by mouth 3 (three) times daily with meals. 02/18/13   Thurnell Lose, MD  fluticasone (FLONASE) 50 MCG/ACT nasal spray Place 2 sprays into both nostrils daily. 07/10/13   Audelia Hives Presson, PA  guaiFENesin-codeine 100-10 MG/5ML syrup Take 10 mLs by mouth at bedtime as needed for cough. 04/15/13   Audelia Hives Presson, PA  ibuprofen (ADVIL,MOTRIN) 800 MG tablet Take 1 tablet (800 mg total) by mouth every 8 (eight) hours as needed for pain (pain). 10/03/12   Timmothy Euler, MD  ipratropium (ATROVENT) 0.06 % nasal spray Place 2 sprays into both nostrils 4 (four) times daily. 04/10/13   Gregor Hams, MD  megestrol (MEGACE) 40 MG tablet 1 tab po tid until bleeding stops, then 1 tab daily until seen for follow up 08/31/12   Rosezella Rumpf, CNM  misoprostol (CYTOTEC) 200 MCG tablet Take 3 pills by  mouth the night before biopsy. 03/11/13   Emily Filbert, MD  Olopatadine HCl 0.2 % SOLN Apply 1 drop to eye daily. 1 drop in each eye once a day 07/10/13   Lutricia Feil, PA  phenazopyridine (PYRIDIUM) 200 MG tablet Take 1 tablet (200 mg total) by mouth 3 (three) times daily. 07/23/14   Freeman Caldron Tasheena [place], PA-C  predniSONE (DELTASONE) 10 MG tablet Take 3 tablets (30 mg total) by mouth daily. 04/10/13   Gregor Hams, MD  predniSONE (DELTASONE) 10 MG tablet Take 2 tablets (20 mg total) by mouth daily. 2 po QD day 1, 2 po QD day 2, 1 po QD day 3 & 4, 1/2 po QD days 5 & 6 then stop 04/15/13   Annett Gula  H Presson, PA   BP 134/85 mmHg  Pulse 79  Temp(Src) 98.3 F (36.8 C) (Oral)  Resp 18  SpO2 98%  LMP 07/20/2014 Physical Exam  Constitutional: She is oriented to person, place, and time. Vital signs are normal. She appears well-developed and well-nourished. No distress.  HENT:  Head: Normocephalic and atraumatic.  Pulmonary/Chest: Effort normal. No respiratory distress.  Abdominal: Soft. There is tenderness in the epigastric area. There is no rigidity, no rebound, no guarding, no CVA tenderness, no tenderness at McBurney's point and negative Murphy's sign.  Neurological: She is alert and oriented to person, place, and time. She has normal strength. Coordination normal.  Skin: Skin is warm and dry. No rash noted. She is not diaphoretic.  Psychiatric: She has a normal mood and affect. Judgment normal.  Nursing note and vitals reviewed.   ED Course  Procedures (including critical care time) Labs Review Labs Reviewed  POCT URINALYSIS DIP (DEVICE) - Abnormal; Notable for the following:    Hgb urine dipstick MODERATE (*)    Protein, ur 30 (*)    Leukocytes, UA LARGE (*)    All other components within normal limits  URINE CULTURE  POCT PREGNANCY, URINE    Imaging Review No results found.   MDM   1. UTI (lower urinary tract infection)    UTI, uncomplicated. Urine culture sent. Treat with Keflex and Pyridium. Ibuprofen when necessary for pain. Follow-up if no improvement in a few days   Meds ordered this encounter  Medications  . cephALEXin (KEFLEX) 500 MG capsule    Sig: Take 2 capsules (1,000 mg total) by mouth 2 (two) times daily.    Dispense:  20 capsule    Refill:  0  . phenazopyridine (PYRIDIUM) 200 MG tablet    Sig: Take 1 tablet (200 mg total) by mouth 3 (three) times daily.    Dispense:  6 tablet    Refill:  0       Liam Graham, PA-C 07/23/14 [phone]

## 2014-07-23 NOTE — Discharge Instructions (Signed)
Infeccin urinaria  (Urinary Tract Infection)  Una infeccin urinaria puede ocurrir en Clinical cytogeneticist del tracto urinario. El tracto [employer] riones, urteres, la vejiga y Geologist, engineering. La causa es un germen llamado bacteria. La infeccin urinaria mejora con antibiticos.  CUIDADOS EN EL HOGAR   Si le recetaron antibiticos, tmelos como le haya indicado el mdico. Tmelos todos, aunque se sienta mejor.  Beba gran cantidad de lquido para mantener el pis (orina) de tono claro o amarillo plido.  Evite el t, las bebidas con cafena y las bebidas gaseosas (carbonatada).  Orine con frecuencia. Evite retener la [employer].  Orine antes y despus de tener sexo (relaciones sexuales).  Si es [place], higiencese desde adelante hacia atrs despus de ir de cuerpo (mover el intestino). Use slo un papel tissue por vez. SOLICITE AYUDA DE INMEDIATO SI:   Siente dolor en la espalda.  Siente un dolor en el vientre (abdominal) muy intenso.  Tiene escalofros.  Tiene Higher education careers adviser (nuseas).  Vomita.  El ardor o las molestias al orinar no desaparecen.  Tiene fiebre.  Los sntomas no mejoran despus de 3 das. ASEGRESE DE QUE:   Comprende estas instrucciones.  Controlar su enfermedad.  Solicitar ayuda de inmediato si no mejora o si empeora. Document Released: 09/08/2009 Document Revised: 12/14/2011 [facility] Patient Information 2015 [place]. This information is not intended to replace advice given to you by your health care provider. Make sure you discuss any questions you have with your health care provider.

## 2014-07-26 LAB — URINE CULTURE

## 2014-07-28 NOTE — ED Notes (Signed)
Urine culture: >100,000 colonies E. Coli.  Pt. adequately treated with Keflex. Mariah Lang 07/28/2014

## 2014-09-02 ENCOUNTER — Other Ambulatory Visit (HOSPITAL_COMMUNITY)
Admission: RE | Admit: 2014-09-02 | Discharge: 2014-09-02 | Disposition: A | Discharge status: Home / Self Care | Payer: No Typology Code available for payment source | Source: Ambulatory Visit | Attending: Internal Medicine | Admitting: Internal Medicine

## 2014-09-02 ENCOUNTER — Encounter: Payer: Self-pay | Admitting: Internal Medicine

## 2014-09-02 ENCOUNTER — Ambulatory Visit: Payer: No Typology Code available for payment source | Attending: Internal Medicine | Admitting: Internal Medicine

## 2014-09-02 VITALS — BP 111/75 | HR 62 | Temp 98.7°F | Resp 18 | Ht 59.0 in | Wt 193.0 lb

## 2014-09-02 DIAGNOSIS — Z0000 Encounter for general adult medical examination without abnormal findings: Secondary | ICD-10-CM

## 2014-09-02 DIAGNOSIS — N946 Dysmenorrhea, unspecified: Secondary | ICD-10-CM | POA: Insufficient documentation

## 2014-09-02 DIAGNOSIS — Z113 Encounter for screening for infections with a predominantly sexual mode of transmission: Secondary | ICD-10-CM | POA: Insufficient documentation

## 2014-09-02 DIAGNOSIS — Z01419 Encounter for gynecological examination (general) (routine) without abnormal findings: Secondary | ICD-10-CM | POA: Insufficient documentation

## 2014-09-02 DIAGNOSIS — D500 Iron deficiency anemia secondary to blood loss (chronic): Secondary | ICD-10-CM

## 2014-09-02 DIAGNOSIS — Z124 Encounter for screening for malignant neoplasm of cervix: Secondary | ICD-10-CM

## 2014-09-02 DIAGNOSIS — N760 Acute vaginitis: Secondary | ICD-10-CM | POA: Insufficient documentation

## 2014-09-02 LAB — COMPLETE METABOLIC PANEL WITH GFR
ALK PHOS: 45 U/L (ref 39–117)
ALT: 13 U/L (ref 0–35)
AST: 16 U/L (ref 0–37)
Albumin: 3.6 g/dL (ref 3.5–5.2)
BUN: 10 mg/dL (ref 6–23)
CHLORIDE: 108 meq/L (ref 96–112)
CO2: 22 mEq/L (ref 19–32)
Calcium: 8.7 mg/dL (ref 8.4–10.5)
Creat: 0.5 mg/dL (ref 0.50–1.10)
GFR, Est African American: >89 mL/min
GFR, Est Non African American: >89 mL/min
Glucose, Bld: 96 mg/dL (ref 70–99)
Potassium: 5 mEq/L (ref 3.5–5.3)
Sodium: 140 mEq/L (ref 135–145)
TOTAL PROTEIN: 6.5 g/dL (ref 6.0–8.3)
Total Bilirubin: 0.3 mg/dL (ref 0.2–1.2)

## 2014-09-02 LAB — CBC WITH DIFFERENTIAL/PLATELET
Basophils Absolute: 0.1 10*3/uL (ref 0.0–0.1)
Basophils Relative: 1 % (ref 0–1)
EOS PCT: 3 % (ref 0–5)
Eosinophils Absolute: 0.2 10*3/uL (ref 0.0–0.7)
HCT: 36.1 % (ref 36.0–46.0)
HEMOGLOBIN: 11 g/dL — AB (ref 12.0–15.0)
LYMPHS ABS: 2.2 10*3/uL (ref 0.7–4.0)
LYMPHS PCT: 26 % (ref 12–46)
MCH: 22.6 pg — ABNORMAL LOW (ref 26.0–34.0)
MCHC: 30.5 g/dL (ref 30.0–36.0)
MCV: 74.1 fL — ABNORMAL LOW (ref 78.0–100.0)
MONO ABS: 0.8 10*3/uL (ref 0.1–1.0)
MPV: 9.1 fL (ref 8.6–12.4)
Monocytes Relative: 10 % (ref 3–12)
NEUTROS ABS: 5 10*3/uL (ref 1.7–7.7)
NEUTROS PCT: 60 % (ref 43–77)
Platelets: 498 10*3/uL — ABNORMAL HIGH (ref 150–400)
RBC: 4.87 MIL/uL (ref 3.87–5.11)
RDW: 20.4 % — ABNORMAL HIGH (ref 11.5–15.5)
WBC: 8.3 10*3/uL (ref 4.0–10.5)

## 2014-09-02 LAB — LIPID PANEL
Cholesterol: 141 mg/dL (ref 0–200)
HDL: 43 mg/dL — ABNORMAL LOW (ref >=46)
LDL Cholesterol: 84 mg/dL (ref 0–99)
TRIGLYCERIDES: 68 mg/dL (ref <150)
Total CHOL/HDL Ratio: 3.3 Ratio
VLDL: 14 mg/dL (ref 0–40)

## 2014-09-02 LAB — POCT GLYCOSYLATED HEMOGLOBIN (HGB A1C): Hemoglobin A1C: 5.1

## 2014-09-02 LAB — POCT URINE PREGNANCY: Preg Test, Ur: NEGATIVE

## 2014-09-02 LAB — TSH: TSH: 1.279 u[IU]/mL (ref 0.350–4.500)

## 2014-09-02 MED ORDER — FERROUS SULFATE 325 (65 FE) MG PO TABS: 325.0000 mg | ORAL_TABLET | Freq: Three times a day (TID) | ORAL | Status: DC

## 2014-09-02 MED ORDER — NAPROXEN 500 MG PO TABS: 500.0000 mg | ORAL_TABLET | Freq: Two times a day (BID) | ORAL | Status: DC

## 2014-09-02 MED ORDER — MEDROXYPROGESTERONE ACETATE 150 MG/ML IM SUSP
150.0000 mg | Freq: Once | INTRAMUSCULAR | Status: AC
  Administered 2014-09-02: 150 mg via INTRAMUSCULAR

## 2014-09-02 NOTE — Progress Notes (Signed)
Patient ID: Mariah Lang, female   DOB: 09/03/1970, 44 y.o.   MRN: [id number]   Chelsee Hosie, is a 44 y.o. female  ZHY:[id number]  [id number]:[id number]  DOB - [birth year]  Chief Complaint  Patient presents with  . Establish Care  . Anemia        Subjective:   Mariah Lang is a 44 y.o. female here today for a follow up visit.  Patient is here today to reestablish medical care. She has history of iron deficiency anemia from chronic blood loss from menorrhagia. She follows up with OB/GYN and currently on Depo-Provera injection. She is here today for annual physical examination and Pap smear. She has no complaint today. Her only medication is iron pill. Her last Pap smear was over 2 years ago , was normal. She has  2 children , 67 and 78 he has old , she desires to have another child but it has not been possible. She is not open for hysterectomy at this time. She usually have severe dysmenorrhea , has been diagnosed with multiple uterine fibroid as a cause of her menorrhagia. She has a scheduled follow-up with the gynecologist. Patient has No headache, No chest pain, No abdominal pain - No Nausea, No new weakness tingling or numbness, No Cough - SOB.  Problem  Iron Deficiency Anemia Due to Chronic Blood Loss  Pap Smear for Cervical Cancer Screening  Annual Physical Exam    ALLERGIES: No Known Allergies  PAST MEDICAL HISTORY: Past Medical History  Diagnosis Date  . Medical history non-contributory   . Anemia     MEDICATIONS AT HOME: Prior to Admission medications   Medication Sig Start Date End Date Taking? Authorizing Provider  ferrous sulfate 325 (65 FE) MG tablet Take 1 tablet (325 mg total) by mouth 3 (three) times daily with meals. 09/02/14  Yes Tresa Garter, MD  predniSONE (DELTASONE) 10 MG tablet Take 3 tablets (30 mg total) by mouth daily. 04/10/13  Yes Gregor Hams, MD  albuterol (PROVENTIL HFA;VENTOLIN HFA) 108 (90 BASE) MCG/ACT inhaler Inhale 1-2 puffs into the  lungs every 6 (six) hours as needed for wheezing or shortness of breath. Patient not taking: Reported on 09/02/2014 04/15/13   Audelia Hives Presson, PA  azithromycin (ZITHROMAX) 250 MG tablet Take 1 tablet (250 mg total) by mouth daily. Take first 2 tablets together, then 1 every day until finished. Patient not taking: Reported on 09/02/2014 04/10/13   Gregor Hams, MD  benzonatate (TESSALON) 100 MG capsule Take 1 capsule (100 mg total) by mouth 3 (three) times daily as needed for cough. Swallow whole Patient not taking: Reported on 09/02/2014 04/15/13   Audelia Hives Presson, PA  cephALEXin (KEFLEX) 500 MG capsule Take 2 capsules (1,000 mg total) by mouth 2 (two) times daily. Patient not taking: Reported on 09/02/2014 07/23/14   Liam Graham, PA-C  docusate sodium (COLACE) 100 MG capsule Take 1 capsule (100 mg total) by mouth 2 (two) times daily. Patient not taking: Reported on 09/02/2014 02/15/13   Thurnell Lose, MD  fluticasone [facility]) 50 MCG/ACT nasal spray Place 2 sprays into both nostrils daily. Patient not taking: Reported on 09/02/2014 07/10/13   Audelia Hives Presson, PA  guaiFENesin-codeine 100-10 MG/5ML syrup Take 10 mLs by mouth at bedtime as needed for cough. Patient not taking: Reported on 09/02/2014 04/15/13   Audelia Hives Presson, PA  ibuprofen (ADVIL,MOTRIN) 800 MG tablet Take 1 tablet (800 mg total) by mouth every 8 (eight) hours  as needed for pain (pain). Patient not taking: Reported on 09/02/2014 10/03/12   Timmothy Euler, MD  ipratropium (ATROVENT) 0.06 % nasal spray Place 2 sprays into both nostrils 4 (four) times daily. Patient not taking: Reported on 09/02/2014 04/10/13   Gregor Hams, MD  megestrol (MEGACE) 40 MG tablet 1 tab po tid until bleeding stops, then 1 tab daily until seen for follow up Patient not taking: Reported on 09/02/2014 08/31/12   Rosezella Rumpf, CNM  misoprostol (CYTOTEC) 200 MCG tablet Take 3 pills by mouth the night before biopsy. Patient not taking:  Reported on 09/02/2014 03/11/13   Emily Filbert, MD  naproxen (NAPROSYN) 500 MG tablet Take 1 tablet (500 mg total) by mouth 2 (two) times daily with a meal. 09/02/14   Tresa Garter, MD  Olopatadine HCl 0.2 % SOLN Apply 1 drop to eye daily. 1 drop in each eye once a day Patient not taking: Reported on 09/02/2014 07/10/13   Lutricia Feil, PA  phenazopyridine (PYRIDIUM) 200 MG tablet Take 1 tablet (200 mg total) by mouth 3 (three) times daily. Patient not taking: Reported on 09/02/2014 07/23/14   Liam Graham, PA-C  predniSONE (DELTASONE) 10 MG tablet Take 2 tablets (20 mg total) by mouth daily. 2 po QD day 1, 2 po QD day 2, 1 po QD day 3 & 4, 1/2 po QD days 5 & 6 then stop Patient not taking: Reported on 09/02/2014 04/15/13   Lutricia Feil, PA     Objective:   Filed Vitals:   09/02/14 1012  BP: 111/75  Pulse: 62  Temp: 98.7 F (37.1 C)  TempSrc: Oral  Resp: 18  Height: 4\' 11"  (1.499 m)  Weight: 193 lb (87.544 kg)  SpO2: 100%    Exam General appearance : Awake, alert, not in any distress. Speech Clear. Not toxic looking , morbidly obese HEENT: Atraumatic and Normocephalic, pupils equally reactive to light and accomodation Neck: supple, no JVD. No cervical lymphadenopathy.  Chest:Good air entry bilaterally, no added sounds  CVS: S1 S2 regular, no murmurs.  Abdomen: Bowel sounds present, Non tender and not distended with no gaurding, rigidity or rebound. Extremities: B/L Lower Ext shows no edema, both legs are warm to touch Neurology: Awake alert, and oriented X 3, CN II-XII intact, Non focal Skin:No Rash Pelvic Exam: Cervix normal in appearance, external genitalia normal, no adnexal masses or tenderness, no cervical motion tenderness, rectovaginal septum normal, uterus normal size, shape, and consistency and vagina normal without discharge   Data Review Lab Results  Component Value Date   HGBA1C 5.3 02/15/2013     Assessment & Plan   1. Iron deficiency  anemia due to chronic blood loss  - ferrous sulfate 325 (65 FE) MG tablet; Take 1 tablet (325 mg total) by mouth 3 (three) times daily with meals.  Dispense: 270 tablet; Refill: 11 - CBC with Differential/Platelet  2. Pap smear for cervical cancer screening  - Cytology - PAP - Cervicovaginal ancillary only  3. Annual physical exam  - COMPLETE METABOLIC PANEL WITH GFR - POCT glycosylated hemoglobin (Hb A1C) - Lipid panel - TSH - Urinalysis, Complete - HIV antibody (with reflex)  4. Dysmenorrhea  - naproxen (NAPROSYN) 500 MG tablet; Take 1 tablet (500 mg total) by mouth 2 (two) times daily with a meal.  Dispense: 60 tablet; Refill: 0  Interpreter was used to communicate directly with patient for the entire encounter including providing detailed patient instructions.  Patient have been counseled extensively about nutrition and exercise Return in about 6 months (around 03/04/2015), or if symptoms worsen or fail to improve, for Routine Follow Up, Anemia.  The patient was given clear instructions to go to ER or return to medical center if symptoms don't improve, worsen or new problems develop. The patient verbalized understanding. The patient was told to call to get lab results if they haven't heard anything in the next week.   This note has been created with Surveyor, quantity. Any transcriptional errors are unintentional.    Angelica Chessman, MD, [place], [place], [place], [place] and [facility] [place], [place]   09/02/2014, 11:12 AM

## 2014-09-02 NOTE — Patient Instructions (Addendum)
Anemia por deficiencia de hierro (Iron Deficiency Anemia) La anemia es una afeccin en la que el nivel de glbulos rojos o hemoglobina en la sangre est por debajo del normal. La hemoglobina es la sustancia de los glbulos rojos que transporta el oxgeno. La anemia por deficiencia de hierro es la anemia provocada por un nivel bajo de hierro. Este es el tipo ms comn de anemia y puede provocarle cansancio y dificultad para Ambulance person. CAUSAS   Falta de hierro en la dieta.  Absorcin deficiente de hierro, como sucede con los trastornos intestinales.  Hemorragia intestinal.  Perodos abundantes. SIGNOS Y SNTOMAS  Cuando la anemia es leve puede pasar inadvertida. Los sntomas pueden ser:  [place].  Dolor de [place].  Piel plida.  Debilidad.  Cansancio.  Falta de aire.  Mareos.  Manos y pies fros.  Frecuencia cardaca acelerada o irregular. DIAGNSTICO  El diagnstico requiere una evaluacin y un examen fsico minuciosos por parte del mdico. [place], se realizan anlisis de sangre para confirmar la anemia por deficiencia de hierro. Es posible que se realicen anlisis adicionales para encontrar la causa subyacente de la anemia. Estos pueden ser:  Anlisis para Hydrographic surveyor la presencia de [employer] heces (anlisis de sangre oculta en heces).  Un procedimiento para examinar el interior del colon y el recto (colonoscopia).  Un procedimiento para examinar el interior del esfago y Product manager (endoscopia). TRATAMIENTO  La anemia por deficiencia de hierro se trata al remediar la causa de la deficiencia. El tratamiento incluye:  Agregarle a la dieta alimentos ricos en hierro.  Tomar suplementos de hierro. Las mujeres embarazadas o que estn amamantando necesitan una dosis adicional de hierro, ya que su dieta normal generalmente no proporciona la cantidad necesaria.  Tomar vitaminas. La vitamina C mejora la absorcin del hierro. Es posible que el mdico le recomiende tomar  los comprimidos de hierro con un vaso de jugo de naranja o con un suplemento de vitamina C.  Medicamentos para disminuir el flujo menstrual abundante.  Ciruga. [place] el hierro segn las indicaciones del mdico.  Si no puede tragar los suplementos de hierro, hable con su mdico sobre la posibilidad de que se lo coloquen en la vena (va intravenosa) o mediante una inyeccin en el msculo.  Para lograr una mejor absorcin del hierro, los suplementos se deben tomar con el estmago vaco. Si no los tolera con el estmago vaco, posiblemente deba tomarlos con la comida.  No beba leche ni tome anticidos al [employer] suplementos de hierro, ya que estos pueden interferir en la absorcin del hierro.  Los suplementos de hierro pueden Recruitment consultant. Para prevenirlo, asegrese de incluir fibras en su dieta. Posiblemente tambin se recomiende el uso de un ablandador de heces.  Tome las vitaminas segn las indicaciones del mdico.  Siga una dieta rica en hierro. Los alimentos con alto contenido de hierro incluyen hgado, carne magra, pan de grano integral, [place], frutas deshidratadas y vegetales de hojas color verde oscuro. SOLICITE ATENCIN MDICA DE INMEDIATO SI:   Se desmaya. Si esto sucede, no conduzca. Comunquese con el servicio de emergencias de su localidad (911 en los [place]) si no dispone de [place].  Siente dolor en el pecho.  Se siente nauseoso o vomita.  Presenta una dificultad respiratoria grave o en aumento al [employer].  Se siente dbil.  Tiene una frecuencia cardaca acelerada.  Comienza a sudar sin motivo.  Se siente mareado cuando  se levanta de una silla o de la cama. ASEGRESE DE QUE:   Comprende estas instrucciones.  Controlar su afeccin.  Recibir ayuda de inmediato si no mejora o si empeora. Document Released: 03/21/2005 Document Revised: 03/26/2013 [facility] Patient  Information 2015 [place]. This information is not intended to replace advice given to you by your health care provider. Make sure you discuss any questions you have with your health care provider. Dismenorrea (Dysmenorrhea) Los Stage manager (dismenorrea) se originan en los espasmos musculares del tero (contracciones) durante el perodo menstrual. En algunas mujeres, el malestar es [place]. En otras, la dismenorrea puede ser tan intensa que interfiere con las actividades diarias durante algunos das, [employer]. La dismenorrea primaria son los clicos menstruales que duran algunos das al inicio de los perodos o poco despus. En general se inician despus de que la adolescente comienza a tener sus perodos. A medida que la mujer avanza en edad, o tiene un beb, los clicos generalmente disminuyen o desaparecen. La dismenorrea secundaria comienza en pocas posteriores de la vida, dura ms tiempo, y Conservation officer, historic buildings puede ser ms intenso. El dolor puede comenzar antes del perodo y [place] despus.  CAUSAS  La causa de la dismenorrea generalmente es un problema subyacente, por ejemplo:  El tejido que recubre interiormente al tero crece fuera del tero en otras reas del cuerpo (endometriosis).  The endometrial tissue, which normally lines the uterus, is found in or grows into the muscular walls of the uterus (adenomyosis).  Los vasos sanguneos de la pelvis se congestionan con sangre antes del perodo menstrual (sndrome congestivo plvico).  Crecimiento excesivo de las clulas (plipos) del revestimiento interior del tero o del cuello uterino.  Cada del tero (prolapso) debido a distensin o flojedad de los ligamentos.  Depresin.  Problemas como infeccin o inflamacin en la vejiga.  Problemas en el intestino, un tumor, o sndrome de colon irritable.  Cncer de los rganos femeninos o en la vejiga.  Un tero muy inclinado.  Una apertura muy  [place] uterino cerrado.  Tumores no cancerosos en el tero (fibromas).  Enfermedad plvica inflamatoria (EPI)  Cicatrices en la pelvis (adherencias) por cirugas previas.  Quiste ovrico  El uso del dispositivo intrauterino (DIU) como mtodo anticonceptivo. FACTORES DE RIESGO Puede tener ms riesgo de dismenorrea si:  Tiene menos de 30 aos.  La pubertad se inici temprano.  Tiene sangrado irregular o abundante.  No ha tenido hijos.  Tiene una historia familiar de este problema.  Es fumadora. SIGNOS Y SNTOMAS   Clicos y dolor punzante en la zona inferior del abdomen.  Dolores de [place].  Dolor de Doctor, hospital.  Nuseas o vmitos.  Diarrea.  Sudoracin o [employer].  Heces blandas. DIAGNSTICO  El diagnstico se basa en la historia personal, los sntomas, el examen fsico, las pruebas diagnsticas o procedimientos. Las pruebas diagnsticas o procedimientos pueden ser:  Anlisis de [place].  Ecografas.  Examen de las capas que cubren el tero (dilatacin y curetaje, D&C).  Examen de la zona interna del abdomen o la pelvis con un laparoscopio.  Radiografas.  Tomografa computada.  Resonancia magntica.  Un examen del interior de la vejiga con un citoscopio.  Examen del interior del intestino o el estmago con un dispositivo especial (colonoscopio o un gastroscopio). TRATAMIENTO  El tratamiento depende de la causa de la dismenorrea. El tratamiento puede incluir:  Analgsicos indicados por su mdico.  Pldoras anticonceptivas o un DIU con progesterona.  Terapia de  reemplazo hormonal.  Antiinflamatorios no esteroides (AINE). Los antiinflamatorios pueden detener la produccin de prostaglandinas.  Ciruga para extirpar las adherencias, endometriosis, quiste de ovarios o fibromas.  Extirpacin del tero (histerectoma).  Inyecciones de progesterona para detener el perodo menstrual.  Corte de los nervios del sacro que van hacia los rganos  femeninos (neurectoma presacra).  Corriente elctrica en los nervios sacros (estimulacin de los nervios sacros).  Medicamentos antidepresivos.  Terapia psiquitrica, psicoterapia individual o de grupo.  Actividad fsica y fisioterapia.  Meditacin y yoga.  Acupuntura. [place] slo medicamentos de venta libre o recetados, segn las indicaciones del mdico.  Coloque una almohadilla elctrica o una botella con agua caliente en la zona inferior del abdomen. No duerma con la almohadilla trmica.  Haga ejercicios aerbicos como caminar, nadar, andar en bicicleta, para [employer].  Masajear la zona inferior de la espalda o el abdomen puede ser de [place].  Deje de fumar.  Evite la cafena y el alcohol. SOLICITE ATENCIN MDICA SI:   El dolor no mejora con los medicamentos recetados.  Tiene dolor durante las [employer].  El dolor aumenta y no puede controlarlo con los medicamentos.  Observa una hemorragia vaginal anormal con el perodo.  Tiene nuseas o vmitos con el perodo menstrual que no puede controlar con medicamentos. SOLICITE ATENCIN MDICA DE INMEDIATO SI:  Se desmaya.  Document Released: 12/29/2004 Document Revised: 11/21/2012 [facility] Patient Information 2015 [place]. This information is not intended to replace advice given to you by your health care provider. Make sure you discuss any questions you have with your health care provider.

## 2014-09-02 NOTE — Progress Notes (Signed)
Establish Care Hx Anemia Complaining of hand pain x 2 weeks,  No Hx injury    Depo Provera given to pt. Neg UPT advised to return between Aug 16- Aug 30

## 2014-09-03 LAB — URINALYSIS, COMPLETE
BILIRUBIN URINE: NEGATIVE
Casts: NONE SEEN
Crystals: NONE SEEN
Glucose, UA: NEGATIVE mg/dL
KETONES UR: NEGATIVE mg/dL
Leukocytes, UA: NEGATIVE
Nitrite: NEGATIVE
PH: 5 (ref 5.0–8.0)
Protein, ur: NEGATIVE mg/dL
SPECIFIC GRAVITY, URINE: 1.027 (ref 1.005–1.030)
UROBILINOGEN UA: 0.2 mg/dL (ref 0.0–1.0)

## 2014-09-03 LAB — CERVICOVAGINAL ANCILLARY ONLY
CHLAMYDIA, DNA PROBE: NEGATIVE
Neisseria Gonorrhea: NEGATIVE
Wet Prep (BD Affirm): NEGATIVE

## 2014-09-03 LAB — CYTOLOGY - PAP

## 2014-09-03 LAB — HIV ANTIBODY (ROUTINE TESTING W REFLEX): HIV 1&2 Ab, 4th Generation: NONREACTIVE

## 2014-09-16 ENCOUNTER — Telehealth: Payer: Self-pay

## 2014-09-16 NOTE — Telephone Encounter (Signed)
-----   Message from Tresa Garter, MD sent at 09/16/2014  1:22 PM EDT ----- Please inform patient that her laboratory test results are mostly within normal limit except for hemoglobin level that is slightly low showing iron deficiency but stable over the past 1 year. Encouraged patient to continue iron supplement. HIV is negative as of 09/02/2014. Pap smear is negative for malignancy, recommend repeat Pap smear in one year. Vaginal swab is negative for infection. Thyroid function is normal.

## 2014-09-16 NOTE — Telephone Encounter (Signed)
Nurse called patient, via interpretor, Mariah Lang, reached voicemail. Could not leave message due to voicemail malfunction.

## 2014-09-17 NOTE — Telephone Encounter (Signed)
Nurse called patient, via Interpreter, [place], reached voicemail. Left message for patient to call Shada Nienaber at [phone].

## 2014-09-17 NOTE — Telephone Encounter (Signed)
-----   Message from Tresa Garter, MD sent at 09/16/2014  1:22 PM EDT ----- Please inform patient that her laboratory test results are mostly within normal limit except for hemoglobin level that is slightly low showing iron deficiency but stable over the past 1 year. Encouraged patient to continue iron supplement. HIV is negative as of 09/02/2014. Pap smear is negative for malignancy, recommend repeat Pap smear in one year. Vaginal swab is negative for infection. Thyroid function is normal.

## 2014-09-18 NOTE — Telephone Encounter (Signed)
-----   Message from Tresa Garter, MD sent at 09/16/2014  1:22 PM EDT ----- Please inform patient that her laboratory test results are mostly within normal limit except for hemoglobin level that is slightly low showing iron deficiency but stable over the past 1 year. Encouraged patient to continue iron supplement. HIV is negative as of 09/02/2014. Pap smear is negative for malignancy, recommend repeat Pap smear in one year. Vaginal swab is negative for infection. Thyroid function is normal.

## 2014-09-18 NOTE — Telephone Encounter (Signed)
Nurse called patient via interpreter, Laurene Footman. Patient aware of mostly normal lab results except hemoglobin level that is slightly low showing iron deficiency but has been stable over the past 1 year. Patient agrees to continue iron supplement. Patient aware of negative HIV as of 09/02/14 and pap smear negative for malignancy. Patient agrees to repeat pap smear in one year. Patient aware of vaginal swab negative for infection and normal thyroid function. Patient has no questions at this time.

## 2014-11-24 ENCOUNTER — Encounter: Payer: Self-pay | Admitting: Internal Medicine

## 2014-11-24 ENCOUNTER — Ambulatory Visit: Payer: No Typology Code available for payment source | Attending: Internal Medicine | Admitting: Internal Medicine

## 2014-11-24 VITALS — BP 108/70 | HR 58 | Temp 97.9°F | Resp 18 | Ht 60.0 in | Wt 196.0 lb

## 2014-11-24 DIAGNOSIS — N924 Excessive bleeding in the premenopausal period: Secondary | ICD-10-CM | POA: Insufficient documentation

## 2014-11-24 DIAGNOSIS — N946 Dysmenorrhea, unspecified: Secondary | ICD-10-CM | POA: Insufficient documentation

## 2014-11-24 DIAGNOSIS — D509 Iron deficiency anemia, unspecified: Secondary | ICD-10-CM | POA: Insufficient documentation

## 2014-11-24 DIAGNOSIS — D500 Iron deficiency anemia secondary to blood loss (chronic): Secondary | ICD-10-CM | POA: Insufficient documentation

## 2014-11-24 MED ORDER — FERROUS SULFATE 325 (65 FE) MG PO TABS: 325.0000 mg | ORAL_TABLET | Freq: Three times a day (TID) | ORAL | Status: DC

## 2014-11-24 MED ORDER — KETOROLAC TROMETHAMINE 30 MG/ML IM SOLN: 30.0000 mg | Freq: Once | INTRAMUSCULAR | Status: DC

## 2014-11-24 MED ORDER — KETOROLAC TROMETHAMINE 30 MG/ML IJ SOLN
30.0000 mg | Freq: Once | INTRAMUSCULAR | Status: AC
  Administered 2014-11-24: 30 mg via INTRAVENOUS

## 2014-11-24 MED ORDER — ACETAMINOPHEN-CODEINE #3 300-30 MG PO TABS: 1.0000 | ORAL_TABLET | ORAL | Status: DC | PRN

## 2014-11-24 NOTE — Patient Instructions (Signed)
Menorragia (Menorrhagia) Se llama menorragia a los perodos menstruales abundantes o que duran ms de lo habitual. En la menorragia, la prdida de sangre y los clicos en cada perodo pueden hacerle imposible seguir con sus actividades habituales. CAUSAS  En algunos casos, la causa de los perodos abundantes es desconocida, pero hay algunas afecciones que pueden causar Psychologist, educational. Las causas ms frecuentes son:  Un problema con la tiroides, que es la glndula productora de hormonas (hipotiroidismo).  Formaciones no cancerosas en el tero (plipos o fibromas).  Un desequilibrio entre las hormonas estrgeno y Immunologist.  Uno de sus ovarios no libera vulos durante uno o ms meses.  Efectos secundarios por haberse colocado un dispositivo intrauterino (DIU).  Efectos secundarios por algunos medicamentos, como antiinflamatorios o anticoagulantes.  Trastornos hemorrgicos que impiden la Transport planner. SIGNOS Y SNTOMAS  Durante un perodo normal, el sangrado dura entre 4 y 29 das. Los signos de que el perodo es muy abundante son:  [place] rutinaria tiene que cambiar el apsito o el tampn cada 1 o 2 horas debido a que est completamente empapado.  Elimina cogulos ms grandes de 1 pulgada (2,5 cm).  Tiene sangrado durante ms de 7 das.  Necesita usar apsitos y tampones al mismo tiempo porque pierde [employer].  Debe levantarse para cambiarse el apsito o el tampn durante la noche.  Tiene sntomas de anemia como cansancio, fatiga o falta de aire. DIAGNSTICO  El [employer] har un examen fsico y le har preguntas sobre sus sntomas y su historia menstrual. Podr indicarle otros estudios segn lo que encuentre Social worker. Estos estudios pueden ser:  Anlisis de [place]. Los [employer] de sangre se usan para verificar si est embarazada o tiene cambios hormonales, un trastorno tiroideo o de sangrado, niveles bajos de hierro (anemia) u otros problemas.  Biopsia  de endometrio. El mdico tomar [place] de tejido del interior del tero para que sea examinado con un microscopio.  Ecografa plvica. Este estudio [place] ondas de sonido para tomar imgenes del tero, los ovarios y Geneticist, molecular. Las imgenes pueden mostrar si tiene fibromas u otros crecimientos.  Histeroscopa. Para este estudio, el mdico usar un pequeo telescopio para Chemical engineer interior del tero. Segn los [employer] estudios iniciales, el mdico podr indicar ms [employer]. TRATAMIENTO  Puede ser que no sea necesario un tratamiento mdico. Si lo necesita, el mdico primero podr recomendarle un tratamiento con uno o ms medicamentos. Si no se reduce el sangrado lo suficiente, el tratamiento quirrgico podra ser [employer]. El mejor tratamiento para usted depender de:   Si necesita evitar un embarazo.  Si desea tener hijos en el futuro.  La causa y la gravedad del sangrado.  Su opinin o preferencia personal. Algunos medicamentos para la menorragia son:  Mtodos anticonceptivos que contengan hormonas. Estos incluyen la pldora anticonceptiva, el parche en la piel, el anillo vaginal, las inyecciones que se aplican cada 3 meses, el DIU hormonal y el implante. Estos tratamientos reducen el sangrado durante el perodo menstrual.  Medicamentos que espesan la sangre y hacen ms lento el sangrado.  Medicamentos que reducen la inflamacin, como el ibuprofeno.  Medicamentos que contienen una hormona sinttica llamada progestina.  Medicamentos que [employer] ovarios dejen de funcionar durante un breve lapso. Podra ser necesario un tratamiento quirrgico para la menorragia si los medicamentos no son eficaces. Las opciones de tratamiento incluyen:  Dilatacin y curetaje (D y C). En este procedimiento, el mdico abre (dilata) el cuello del tero  y luego raspa o succiona tejido del revestimiento interior del tero para reducir el sangrado menstrual.  Histeroscopa quirrgica. En  este procedimiento, se utiliza un pequeo tubo con Hali Marry (histeroscopio) para observar la cavidad uterina y ayudar en la extirpacin quirrgica de un plipo que puede ser la causa de perodos abundantes.  Ablacin del endometrio. Por medio de [employer], el mdico destruye de [place] todo el revestimiento interno del tero (endometrio). Luego de la ablacin del endometrio, la mayora de las mujeres tienen escaso flujo menstrual, o no lo tienen. La ablacin del endometrio reduce la posibilidad de quedar embarazada.  Reseccin del endometrio. En este procedimiento quirrgico, se [place] un asa de Environmental manager para extirpar el revestimiento interno del tero. Este procedimiento tambin reduce la posibilidad de [place].  Histerectoma. La remocin [place] del tero y el cuello del tero es un procedimiento permanente que detiene los perodos [place]. El embarazo no es posible luego de Physicist, medical. Este procedimiento requiere de anestesia y hospitalizacin. [place] solo medicamentos de venta libre o recetados, segn las indicaciones del [place] todos los medicamentos recetados exactamente como se le indic. No cambie ni reemplace los medicamentos sin consultarlo con el mdico.  Tome los comprimidos de hierro recetados, Scientist, water quality segn las indicaciones del mdico. Las hemorragias de larga duracin pueden traer como consecuencia una disminucin en los niveles de hierro. Los comprimidos de hierro ayudan a Camera operator hierro que el organismo pierde luego de un sangrado abundante. El hierro puede causarle estreimiento. Si esto es un problema, aumente el consumo de [place], frutas y [place].  No tome aspirina ni medicamentos que contengan aspirina desde 1 semana antes ni durante el perodo menstrual. La aspirina puede hacer que la hemorragia empeore.  Si necesita cambiar el apsito o el tampn ms de una vez  cada 2horas, Nature conservation officer en cama y descanse todo lo posible hasta que la hemorragia se detenga.  Siga una dieta balanceada. Consuma alimentos ricos en hierro. Por ejemplo, vegetales de Boeing, carne, hgado, huevos y panes y Actor de grano entero. No trate de perder peso hasta que la hemorragia anormal se detenga y los niveles de hierro en la sangre vuelvan a la normalidad. SOLICITE ATENCIN MDICA SI:   Empapa un tampn o un apsito cada 1 o 2 horas, y [employer] ocurre cada vez que tiene el perodo.  Necesita usar apsitos y tampones al mismo tiempo porque pierde [employer].  Debe cambiarse el apsito o el tampn durante la noche.  Tiene un perodo que dura ms de 8 das.  Elimina cogulos de ms de 1 pulgada (2,5 cm).  Tiene perodos irregulares que ocurren ms o menos de una vez al mes.  Se siente mareada o se desmaya.  Se siente muy dbil o cansada.  Le falta el aire o siente que el corazn late muy rpido al hacer [place].  Tiene nuseas y vmitos o diarrea mientras toma los medicamentos.  Tiene algn problema que puede estar relacionado con el medicamento que est tomando. SOLICITE ATENCIN MDICA DE INMEDIATO SI:   Empapa 4 o ms apsitos o tampones en 2 horas.  Tiene sangrado y est embarazada. ASEGRESE DE QUE:   Comprende estas instrucciones.  Controlar su afeccin.  Recibir ayuda de inmediato si no mejora o si empeora. Document Released: 12/29/2004 Document Revised: 03/26/2013 [facility] Patient Information 2015 [place]. This information is not intended to replace advice given to you by your health  care provider. Make sure you discuss any questions you have with your health care provider.

## 2014-11-24 NOTE — Progress Notes (Signed)
Patient thought she was coming to see doctor but was on nurse schedule for Depo. Patient did not receive depo due to some issues needed to be seen by doctor.   Patient has had period since August 11. Patient reports feeling pain in right side, big blood clot came out, then heavy flow follows. Patient reports passing clots on the July 30, 31 and August 1. Clots are about the size of an orange.   Patient currently having pain in lower abdomen, right worse than left, at level 9, pain "bends me over", described as throbbing. Patient takes naproxen 500mg , helps alleviate pain and then releases clot. When having clots "private parts" pulse until its completely out.    Patient reports abdomen swells and she has to remove her belt during work.

## 2014-11-24 NOTE — Progress Notes (Signed)
Patient ID: Mariah Lang, female   DOB: 1970-08-12, 44 y.o.   MRN: [id number]   Mariah Lang, is a 44 y.o. female  [id number]  NUU:[id number]  DOB - December 14, 1970  Chief Complaint  Patient presents with  . Menorrhagia        Subjective:   Mariah Lang is a 44 y.o. female here today as a walk-in for painful menstrual period and heavy bleeding. Patient thought she was coming to see doctor but was on nurse schedule for Depo. Patient did not receive depo due to some concerns she has. Patient was seen by gynecologist and was placed on Depo-Provera for menorrhagia but since then she has been having heavy bleeding with clots and prolonged bleeding. Patient has had period since August 11. Patient reports feeling pain in right side, big blood clot came out, then heavy flow follows. Patient reports passing clots on the July 30, 31 and August 1. Clots are about the size of an orange. Patient currently having pain in lower abdomen, right worse than left, at level 9, pain "bends me over", described as throbbing. Patient takes naproxen 500mg , helps alleviate pain and then releases clot. When having clots "private parts" pulse until its completely out. Patient reports abdomen swells and she has to remove her belt during work.  Patient has No headache, No chest pain, No Nausea, No new weakness tingling or numbness, No Cough - SOB.  Problem  Menorrhagia, Premenopausal  Iron Deficiency Anemia    ALLERGIES: No Known Allergies  PAST MEDICAL HISTORY: Past Medical History  Diagnosis Date  . Medical history non-contributory   . Anemia     MEDICATIONS AT HOME: Prior to Admission medications   Medication Sig Start Date End Date Taking? Authorizing Provider  acetaminophen-codeine (TYLENOL #3) 300-30 MG per tablet Take 1 tablet by mouth every 4 (four) hours as needed. 11/24/14   Tresa Garter, MD  albuterol (PROVENTIL HFA;VENTOLIN HFA) 108 (90 BASE) MCG/ACT inhaler Inhale 1-2 puffs into  the lungs every 6 (six) hours as needed for wheezing or shortness of breath. Patient not taking: Reported on 09/02/2014 04/15/13   Lutricia Feil, PA  ferrous sulfate 325 (65 FE) MG tablet Take 1 tablet (325 mg total) by mouth 3 (three) times daily with meals. 11/24/14   Tresa Garter, MD  fluticasone (FLONASE) 50 MCG/ACT nasal spray Place 2 sprays into both nostrils daily. Patient not taking: Reported on 09/02/2014 07/10/13   Audelia Hives Presson, PA  ipratropium (ATROVENT) 0.06 % nasal spray Place 2 sprays into both nostrils 4 (four) times daily. Patient not taking: Reported on 09/02/2014 04/10/13   Gregor Hams, MD  ketorolac (TORADOL) 30 MG/ML injection Inject 1 mL (30 mg total) into the muscle once. 11/24/14   Tresa Garter, MD  misoprostol (CYTOTEC) 200 MCG tablet Take 3 pills by mouth the night before biopsy. Patient not taking: Reported on 09/02/2014 03/11/13   Emily Filbert, MD  Olopatadine HCl 0.2 % SOLN Apply 1 drop to eye daily. 1 drop in each eye once a day Patient not taking: Reported on 09/02/2014 07/10/13   Lutricia Feil, PA  phenazopyridine (PYRIDIUM) 200 MG tablet Take 1 tablet (200 mg total) by mouth 3 (three) times daily. Patient not taking: Reported on 09/02/2014 07/23/14   Liam Graham, PA-C     Objective:   Filed Vitals:   11/24/14 1042 11/24/14 1050  BP:  108/70  Pulse:  58  Temp:  97.9 F (36.6 C)  TempSrc:  Oral  Resp:  18  Height: 5' (1.524 m)   Weight: 196 lb (88.905 kg)   SpO2:  100%    Exam General appearance : Awake, alert, not in any distress. Speech Clear. Not toxic looking HEENT: Atraumatic and Normocephalic, pupils equally reactive to light and accomodation Neck: supple, no JVD. No cervical lymphadenopathy.  Chest:Good air entry bilaterally, no added sounds  CVS: S1 S2 regular, no murmurs.  Abdomen: Bowel sounds present, suprapubic tenderness++, but abdomen not distended with no gaurding, rigidity or rebound. Extremities: B/L  Lower Ext shows no edema, both legs are warm to touch Neurology: Awake alert, and oriented X 3, CN II-XII intact, Non focal Skin:No Rash  Data Review Lab Results  Component Value Date   HGBA1C 5.10 09/02/2014   HGBA1C 5.3 02/15/2013     Assessment & Plan   1. Dysmenorrhea  - ketorolac (TORADOL) 30 MG/ML injection; Inject 1 mL (30 mg total) into the muscle once.  Dispense: 1 mL; Refill: 0 - acetaminophen-codeine (TYLENOL #3) 300-30 MG per tablet; Take 1 tablet by mouth every 4 (four) hours as needed.  Dispense: 60 tablet; Refill: 0 - Ambulatory referral to Gynecology  2. Menorrhagia, premenopausal  - Ambulatory referral to Gynecology  3. Iron deficiency anemia due to chronic blood loss  - ferrous sulfate 325 (65 FE) MG tablet; Take 1 tablet (325 mg total) by mouth 3 (three) times daily with meals.  Dispense: 270 tablet; Refill: 11 - CBC with Differential/Platelet  Interpreter was used to communicate directly with patient for the entire encounter including providing detailed patient instructions.   Patient have been counseled extensively about nutrition and exercise Return in about 3 months (around 02/24/2015) for Menorrhagia, , Follow up Pain and comorbidities.  The patient was given clear instructions to go to ER or return to medical center if symptoms don't improve, worsen or new problems develop. The patient verbalized understanding. The patient was told to call to get lab results if they haven't heard anything in the next week.   This note has been created with Surveyor, quantity. Any transcriptional errors are unintentional.    Angelica Chessman, MD, [place], [place], [place], [place] and [place], [place]   11/24/2014, 11:19 AM

## 2014-11-25 LAB — CBC WITH DIFFERENTIAL/PLATELET
BASOS ABS: 0 10*3/uL (ref 0.0–0.1)
Basophils Relative: 0 % (ref 0–1)
Eosinophils Absolute: 0.2 10*3/uL (ref 0.0–0.7)
Eosinophils Relative: 2 % (ref 0–5)
HCT: 36.5 % (ref 36.0–46.0)
HEMOGLOBIN: 11.7 g/dL — AB (ref 12.0–15.0)
Lymphocytes Relative: 27 % (ref 12–46)
Lymphs Abs: 2.3 10*3/uL (ref 0.7–4.0)
MCH: 25.5 pg — ABNORMAL LOW (ref 26.0–34.0)
MCHC: 32.1 g/dL (ref 30.0–36.0)
MCV: 79.5 fL (ref 78.0–100.0)
MONO ABS: 0.8 10*3/uL (ref 0.1–1.0)
MPV: 9.2 fL (ref 8.6–12.4)
Monocytes Relative: 9 % (ref 3–12)
NEUTROS ABS: 5.3 10*3/uL (ref 1.7–7.7)
NEUTROS PCT: 62 % (ref 43–77)
Platelets: 464 10*3/uL — ABNORMAL HIGH (ref 150–400)
RBC: 4.59 MIL/uL (ref 3.87–5.11)
RDW: 18.7 % — ABNORMAL HIGH (ref 11.5–15.5)
WBC: 8.5 10*3/uL (ref 4.0–10.5)

## 2014-12-02 ENCOUNTER — Telehealth: Payer: Self-pay | Admitting: *Deleted

## 2014-12-02 NOTE — Telephone Encounter (Signed)
-----   Message from Tresa Garter, MD sent at 12/01/2014 11:05 AM EDT ----- Please inform patient that her hemoglobin is stable. Encourage patient to follow up with Gynecologist

## 2014-12-02 NOTE — Telephone Encounter (Signed)
[place] interpreter id # [phone] interpreted phone call.  Left HIPAA compliant msg for patient to return my call.

## 2014-12-04 ENCOUNTER — Telehealth: Payer: Self-pay | Admitting: Internal Medicine

## 2014-12-04 NOTE — Telephone Encounter (Signed)
Patient returning phone call from nurse, please f/u with pt.

## 2014-12-05 ENCOUNTER — Encounter (HOSPITAL_COMMUNITY): Payer: Self-pay | Admitting: *Deleted

## 2014-12-05 ENCOUNTER — Inpatient Hospital Stay (HOSPITAL_COMMUNITY)
Admission: AD | Admit: 2014-12-05 | Discharge: 2014-12-05 | Disposition: A | Discharge status: Home / Self Care | Payer: Self-pay | Source: Ambulatory Visit | Attending: Obstetrics & Gynecology | Admitting: Obstetrics & Gynecology

## 2014-12-05 ENCOUNTER — Telehealth: Payer: Self-pay | Admitting: Internal Medicine

## 2014-12-05 DIAGNOSIS — N920 Excessive and frequent menstruation with regular cycle: Secondary | ICD-10-CM | POA: Insufficient documentation

## 2014-12-05 DIAGNOSIS — R102 Pelvic and perineal pain: Secondary | ICD-10-CM | POA: Insufficient documentation

## 2014-12-05 DIAGNOSIS — N921 Excessive and frequent menstruation with irregular cycle: Secondary | ICD-10-CM

## 2014-12-05 LAB — URINALYSIS, ROUTINE W REFLEX MICROSCOPIC
Bilirubin Urine: NEGATIVE
Glucose, UA: NEGATIVE mg/dL
Ketones, ur: NEGATIVE mg/dL
Leukocytes, UA: NEGATIVE
NITRITE: NEGATIVE
Protein, ur: NEGATIVE mg/dL
SPECIFIC GRAVITY, URINE: 1.025 (ref 1.005–1.030)
UROBILINOGEN UA: 0.2 mg/dL (ref 0.0–1.0)
pH: 6 (ref 5.0–8.0)

## 2014-12-05 LAB — URINE MICROSCOPIC-ADD ON

## 2014-12-05 LAB — WET PREP, GENITAL
Clue Cells Wet Prep HPF POC: NONE SEEN
Trich, Wet Prep: NONE SEEN
YEAST WET PREP: NONE SEEN

## 2014-12-05 LAB — CBC
HCT: 33 % — ABNORMAL LOW (ref 36.0–46.0)
HEMOGLOBIN: 10.4 g/dL — AB (ref 12.0–15.0)
MCH: 25.4 pg — AB (ref 26.0–34.0)
MCHC: 31.5 g/dL (ref 30.0–36.0)
MCV: 80.7 fL (ref 78.0–100.0)
Platelets: 412 10*3/uL — ABNORMAL HIGH (ref 150–400)
RBC: 4.09 MIL/uL (ref 3.87–5.11)
RDW: 17.7 % — ABNORMAL HIGH (ref 11.5–15.5)
WBC: 8.3 10*3/uL (ref 4.0–10.5)

## 2014-12-05 LAB — POCT PREGNANCY, URINE: PREG TEST UR: NEGATIVE

## 2014-12-05 MED ORDER — OXYCODONE-ACETAMINOPHEN 5-325 MG PO TABS: 2.0000 | ORAL_TABLET | ORAL | Status: DC | PRN

## 2014-12-05 MED ORDER — MEGESTROL ACETATE 20 MG PO TABS: ORAL_TABLET | ORAL | Status: DC

## 2014-12-05 MED ORDER — KETOROLAC TROMETHAMINE 60 MG/2ML IM SOLN
60.0000 mg | INTRAMUSCULAR | Status: AC
  Administered 2014-12-05: 60 mg via INTRAMUSCULAR
  Filled 2014-12-05: qty 2

## 2014-12-05 MED ORDER — IBUPROFEN 600 MG PO TABS: 600.0000 mg | ORAL_TABLET | Freq: Four times a day (QID) | ORAL | Status: DC | PRN

## 2014-12-05 NOTE — Telephone Encounter (Signed)
Patient came in requesting a medication for pain, Patient would like Percoset or a strong alternative. Patient is interested in a Referral for Pain Management. Please follow up.

## 2014-12-05 NOTE — MAU Provider Note (Signed)
Chief Complaint: Pelvic Pain and Vaginal Bleeding   First Provider Initiated Contact with Patient 12/05/14 1250      SUBJECTIVE HPI: Mariah Lang is a 44 y.o. G2P2002 who presents to maternity admissions reporting pelvic pain and vaginal bleeding off and on x 3 months since she received an injection of Depo Provera.  She also reports her primary care office diagnosed uterine fibroids ~ 1 year ago and advised her to have a hysterectomy but she desires another pregnancy so does not want surgery.  She reports soaking 1 pad every 3-4 hours with large clots and reports at least some bleeding every day since her Depo injection.  She was receiving Percocet from her primary care office but they will not renew her prescription.   She denies vaginal itching/burning, urinary symptoms, h/a, dizziness, n/v, or fever/chills.     Pelvic Pain The patient's primary symptoms include pelvic pain and vaginal bleeding. The patient's pertinent negatives include no vaginal discharge. This is a new problem. The current episode started more than 1 month ago. The problem occurs constantly. The problem has been waxing and waning. The pain is severe. The problem affects both sides. She is not pregnant. Associated symptoms include abdominal pain. Pertinent negatives include no back pain, chills, constipation, diarrhea, dysuria, fever, flank pain, frequency, headaches, nausea, urgency or vomiting. The vaginal bleeding is typical of menses. She has been passing clots. She has not been passing tissue. Nothing aggravates the symptoms. She has tried acetaminophen (Percocet) for the symptoms. She uses nothing for contraception. Her past medical history is significant for a Cesarean section.  Vaginal Bleeding The patient's primary symptoms include pelvic pain and vaginal bleeding. The patient's pertinent negatives include no vaginal discharge. Associated symptoms include abdominal pain. Pertinent negatives include no back pain,  chills, constipation, diarrhea, dysuria, fever, flank pain, frequency, headaches, nausea, urgency or vomiting. Her past medical history is significant for a Cesarean section.    Past Medical History  Diagnosis Date  . Anemia    Past Surgical History  Procedure Laterality Date  . Other surgical history  1993    c-section  . Appendectomy    . Cesarean section  2000   Social History   Social History  . Marital Status: Single    Spouse Name: N/A  . Number of Children: N/A  . Years of Education: N/A   Occupational History  . Not on file.   Social History Main Topics  . Smoking status: Never Smoker   . Smokeless tobacco: Not on file  . Alcohol Use: No  . Drug Use: No  . Sexual Activity: Yes    Birth Control/ Protection: None   Other Topics Concern  . Not on file   Social History Narrative   No current facility-administered medications on file prior to encounter.   Current Outpatient Prescriptions on File Prior to Encounter  Medication Sig Dispense Refill  . ferrous sulfate 325 (65 FE) MG tablet Take 1 tablet (325 mg total) by mouth 3 (three) times daily with meals. 270 tablet 11   No Known Allergies  ROS:  Review of Systems  Constitutional: Negative for fever, chills and fatigue.  HENT: Negative for sinus pressure.   Eyes: Negative for photophobia.  Respiratory: Negative for shortness of breath.   Cardiovascular: Negative for chest pain.  Gastrointestinal: Positive for abdominal pain. Negative for nausea, vomiting, diarrhea and constipation.  Genitourinary: Positive for vaginal bleeding and pelvic pain. Negative for dysuria, urgency, frequency, flank pain, vaginal discharge, difficulty urinating  and vaginal pain.  Musculoskeletal: Negative for back pain and neck pain.  Neurological: Negative for dizziness, weakness and headaches.  Psychiatric/Behavioral: Negative.     I have reviewed patient's Past Medical Hx, Surgical Hx, Family Hx, Social Hx, medications and  allergies.   Physical Exam   Patient Vitals for the past 24 hrs:  BP Temp Temp src Pulse Resp Height Weight  12/05/14 1435 133/89 mmHg 98.4 F (36.9 C) Oral 80 18 - -  12/05/14 1117 135/74 mmHg 98 F (36.7 C) Oral 70 18 5' (1.524 m) 88.089 kg (194 lb 3.2 oz)   Constitutional: Well-developed, well-nourished female in no acute distress.  Cardiovascular: normal rate Respiratory: normal effort GI: Abd soft, non-tender. Pos BS x 4 MS: Extremities nontender, no edema, normal ROM Neurologic: Alert and oriented x 4.  GU: Neg CVAT.  PELVIC EXAM: Cervix pink, visually closed, without lesion, small amount dark red bleeding with small clots, vaginal walls and external genitalia normal Bimanual exam: Cervix 0/long/high, firm, anterior, neg CMT, uterus nontender, slightly enlarged, adnexa without tenderness, enlargement, or mass  LAB RESULTS Results for orders placed or performed during the hospital encounter of 12/05/14 (from the past 24 hour(s))  Urinalysis, Routine w reflex microscopic (not at [facility])     Status: Abnormal   Collection Time: 12/05/14 11:25 AM  Result Value Ref Range   Color, Urine YELLOW YELLOW   APPearance CLEAR CLEAR   Specific Gravity, Urine 1.025 1.005 - 1.030   pH 6.0 5.0 - 8.0   Glucose, UA NEGATIVE NEGATIVE mg/dL   Hgb urine dipstick LARGE (A) NEGATIVE   Bilirubin Urine NEGATIVE NEGATIVE   Ketones, ur NEGATIVE NEGATIVE mg/dL   Protein, ur NEGATIVE NEGATIVE mg/dL   Urobilinogen, UA 0.2 0.0 - 1.0 mg/dL   Nitrite NEGATIVE NEGATIVE   Leukocytes, UA NEGATIVE NEGATIVE  Urine microscopic-add on     Status: None   Collection Time: 12/05/14 11:25 AM  Result Value Ref Range   Squamous Epithelial / LPF RARE RARE   WBC, UA 0-2 <3 WBC/hpf   RBC / HPF TOO NUMEROUS TO COUNT <3 RBC/hpf   Bacteria, UA RARE RARE   Urine-Other MUCOUS PRESENT   Pregnancy, urine POC     Status: None   Collection Time: 12/05/14 11:40 AM  Result Value Ref Range   Preg Test, Ur NEGATIVE NEGATIVE   CBC     Status: Abnormal   Collection Time: 12/05/14  1:04 PM  Result Value Ref Range   WBC 8.3 4.0 - 10.5 K/uL   RBC 4.09 3.87 - 5.11 MIL/uL   Hemoglobin 10.4 (L) 12.0 - 15.0 g/dL   HCT 33.0 (L) 36.0 - 46.0 %   MCV 80.7 78.0 - 100.0 fL   MCH 25.4 (L) 26.0 - 34.0 pg   MCHC 31.5 30.0 - 36.0 g/dL   RDW 17.7 (H) 11.5 - 15.5 %   Platelets 412 (H) 150 - 400 K/uL  Wet prep, genital     Status: Abnormal   Collection Time: 12/05/14  1:11 PM  Result Value Ref Range   Yeast Wet Prep HPF POC NONE SEEN NONE SEEN   Trich, Wet Prep NONE SEEN NONE SEEN   Clue Cells Wet Prep HPF POC NONE SEEN NONE SEEN   WBC, Wet Prep HPF POC FEW (A) NONE SEEN       MAU Management/MDM: Ordered labs and reviewed results. Treatments in MAU included Toradol 60 mg IM. Pt stable at time of discharge.  ASSESSMENT 1. Breakthrough bleeding on  depo provera   2. Menorrhagia with irregular cycle     PLAN Discharge home with bleeding precautions Megace taper Ibuprofen 600 mg PO Q 6 hours PRN Percocet 5/325, take 1-2 Q 6 hours PRN x 15 tabs Continue iron supplement daily Outpatient [place] ordered F/U in clinic as scheduled   Follow-up Information    Follow up with [facility].   Specialty:  Obstetrics and Gynecology   Why:  As scheduled   Contact information:   [place] [phone]      Follow up with [place].   Why:  As needed for emergencies   Contact information:   [address] [phone] [place] Certified Nurse-Midwife 12/05/2014  3:05 PM

## 2014-12-05 NOTE — MAU Note (Addendum)
Pelvic pain & heavy bleeding since 8/11, passing large clots.  Pt has been receiving percocet from clinic in town, was told yesterday she could not have more pain med.  Has appt in GYN clinic on 9/15.  Pt has hx of anemia and currently taking iron pills for that.

## 2014-12-05 NOTE — Discharge Instructions (Signed)
Sangrado uterino anormal (Abnormal Uterine Bleeding) El sangrado uterino anormal puede afectar a las mujeres que estn en diversas etapas de la vida, desde adolescentes, mujeres frtiles y mujeres embarazadas, hasta mujeres que han llegado a la menopausia. Hay diversas clases de sangrado uterino que se consideran anormales, entre ellas:  Prdidas de sangre o hemorragias entre los perodos.  Hemorragias luego de mantener relaciones sexuales.  Sangrado abundante o ms que lo habitual.  Perodos que duran ms que lo normal.  Sangrado luego de la menopausia. Muchos casos de sangrado uterino anormal son leves y simples de tratar, mientras que otros son ms graves. El mdico debe evaluar cualquier clase de sangrado anormal. El tratamiento depender de la causa del sangrado. INSTRUCCIONES PARA EL CUIDADO EN EL HOGAR Controle su afeccin para ver si hay cambios. Las siguientes indicaciones ayudarn a aliviar cualquier molestia que pueda sentir:  Evite las duchas vaginales y el uso de tampones segn las indicaciones del mdico.  Cmbiese las compresas con frecuencia. Deber hacerse exmenes plvicos regulares y pruebas de Papanicolaou. Cumpla con todas las visitas de control y exmanes diagnsticos, segn le indique su mdico.  SOLICITE ATENCIN MDICA SI:   El sangrado dura ms de 1 semana.  Se siente mareada por momentos. SOLICITE ATENCIN MDICA DE INMEDIATO SI:   Se desmaya.  Debe cambiarse la compresa cada 15 a 30 minutos.  Siente dolor abdominal.  Tiene fiebre.  Se siente dbil o presenta sudoracin.  Elimina cogulos grandes por la vagina.  Comienza a sentir nuseas y vomita. ASEGRESE DE QUE:   Comprende estas instrucciones.  Controlar su afeccin.  Recibir ayuda de inmediato si no mejora o si empeora. Document Released: 03/21/2005 Document Revised: 03/26/2013 ExitCare Patient Information 2015 [employer]. This information is not intended to replace advice given  to you by your health care provider. Make sure you discuss any questions you have with your health care provider.  

## 2014-12-05 NOTE — Telephone Encounter (Signed)
Pt here in our clinic for lab results  Date of birth verified by pt  Results given  Results given in Spanish  Pt verbalize understanding    ---- Message from Tresa Garter, MD sent at 12/01/2014 11:05 AM EDT ----- Please inform patient that her hemoglobin is stable. Encourage patient to follow up with Gynecologist

## 2014-12-09 LAB — GC/CHLAMYDIA PROBE AMP (~~LOC~~) NOT AT ARMC
CHLAMYDIA, DNA PROBE: NEGATIVE
NEISSERIA GONORRHEA: NEGATIVE

## 2014-12-10 ENCOUNTER — Ambulatory Visit (HOSPITAL_COMMUNITY)
Admission: RE | Admit: 2014-12-10 | Discharge: 2014-12-10 | Disposition: A | Discharge status: Home / Self Care | Payer: Self-pay | Source: Ambulatory Visit | Attending: Advanced Practice Midwife | Admitting: Advanced Practice Midwife

## 2014-12-10 ENCOUNTER — Other Ambulatory Visit (HOSPITAL_COMMUNITY): Payer: Self-pay | Admitting: Advanced Practice Midwife

## 2014-12-10 DIAGNOSIS — N939 Abnormal uterine and vaginal bleeding, unspecified: Secondary | ICD-10-CM | POA: Insufficient documentation

## 2014-12-10 DIAGNOSIS — N8320 Unspecified ovarian cysts: Secondary | ICD-10-CM | POA: Insufficient documentation

## 2014-12-10 DIAGNOSIS — N7011 Chronic salpingitis: Secondary | ICD-10-CM | POA: Insufficient documentation

## 2014-12-10 DIAGNOSIS — D251 Intramural leiomyoma of uterus: Secondary | ICD-10-CM | POA: Insufficient documentation

## 2014-12-10 DIAGNOSIS — D250 Submucous leiomyoma of uterus: Secondary | ICD-10-CM | POA: Insufficient documentation

## 2014-12-10 DIAGNOSIS — R102 Pelvic and perineal pain: Secondary | ICD-10-CM | POA: Insufficient documentation

## 2014-12-10 DIAGNOSIS — N921 Excessive and frequent menstruation with irregular cycle: Secondary | ICD-10-CM

## 2014-12-10 DIAGNOSIS — D252 Subserosal leiomyoma of uterus: Secondary | ICD-10-CM | POA: Insufficient documentation

## 2014-12-10 IMAGING — US US TRANSVAGINAL NON-OB
1 series · 14 of 25 positions shown · non-contrast
Comparison: [DATE] pelvic sonogram.

CLINICAL DATA: 43-year-old female with irregular menstrual cycles
and abnormal uterine bleeding on Depo-Provera and a history of
uterine fibroids. Intermittent pelvic pain. LMP [DATE], day 28
of menstrual cycle.

EXAM:
TRANSABDOMINAL AND TRANSVAGINAL ULTRASOUND OF PELVIS
TECHNIQUE: Both transabdominal and transvaginal ultrasound examinations of the
pelvis were performed. Transabdominal technique was performed for
global imaging of the pelvis including uterus, ovaries, adnexal
regions, and pelvic cul-de-sac. It was necessary to proceed with
endovaginal exam following the transabdominal exam to visualize the
endometrium, ovaries and adnexa.

[Series 1: us transvaginal non-ob · 14 of 106 slices shown]
[im 1/106]
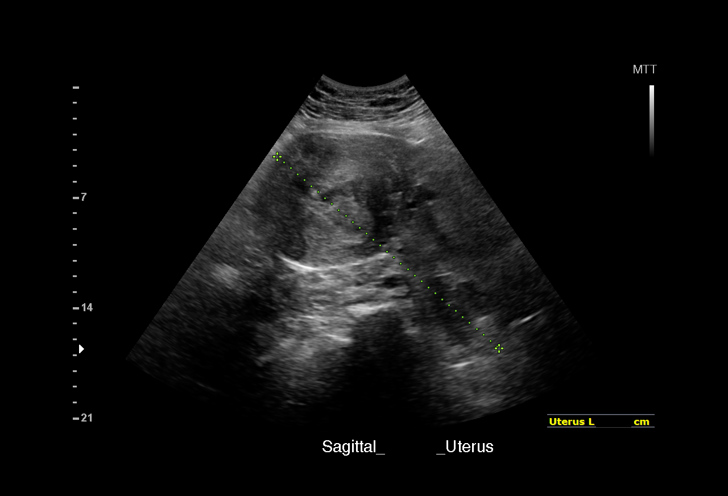
[im 9/106]
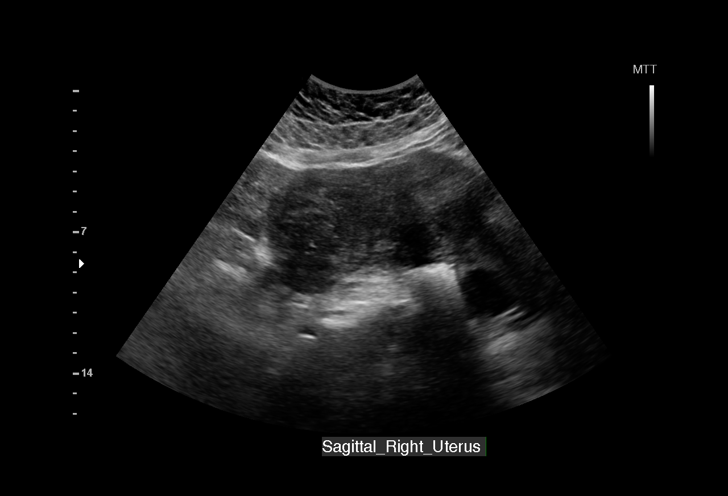
[im 18/106]
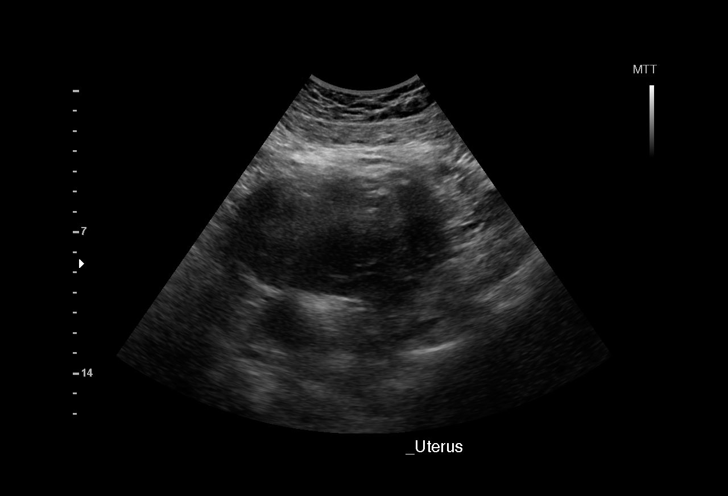
[im 27/106]
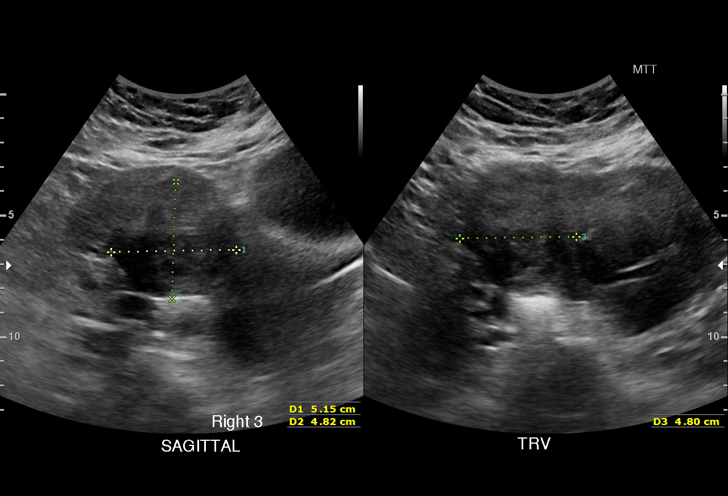
[im 36/106]
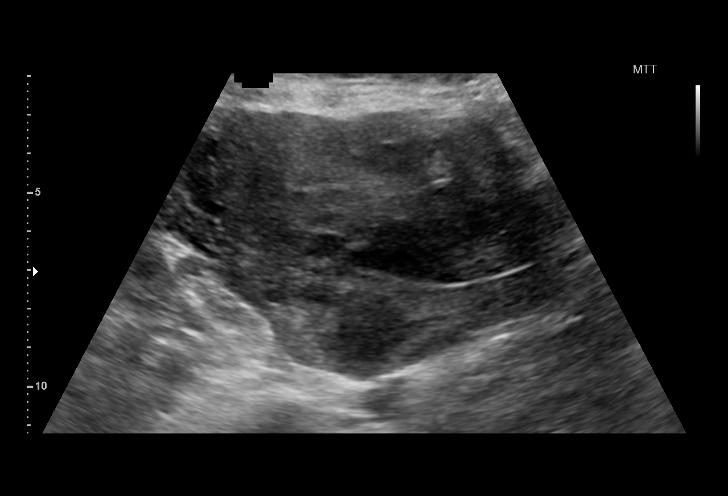
[im 40/106]
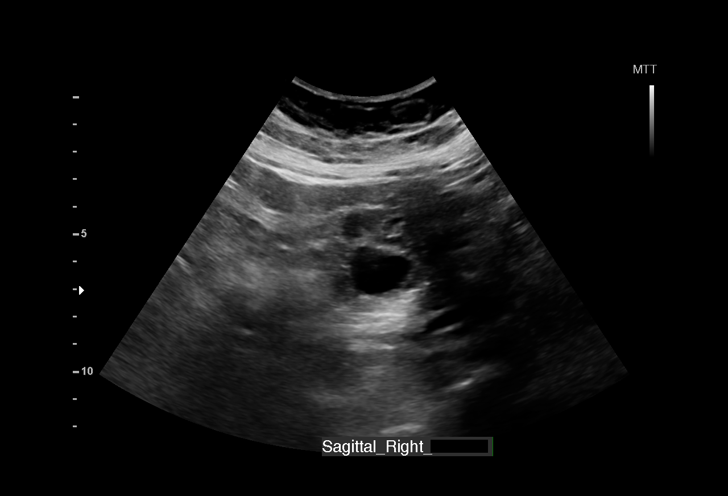
[im 49/106]
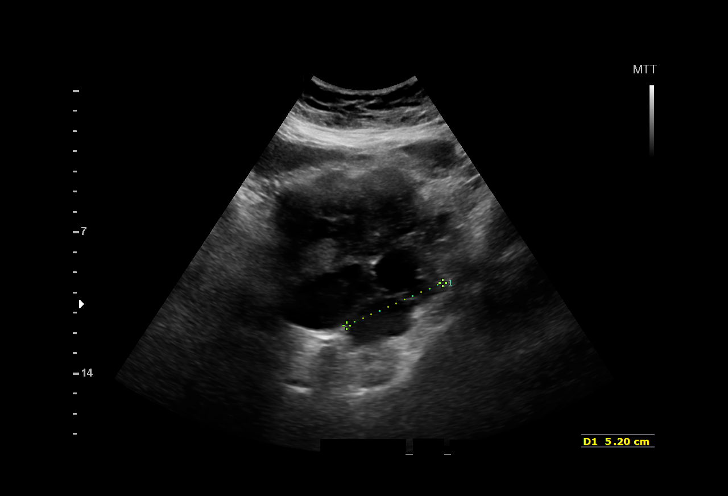
[im 57/106]
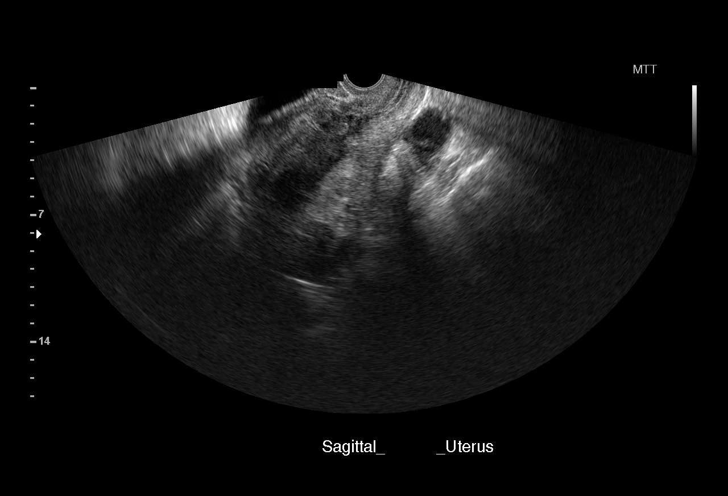
[im 66/106]
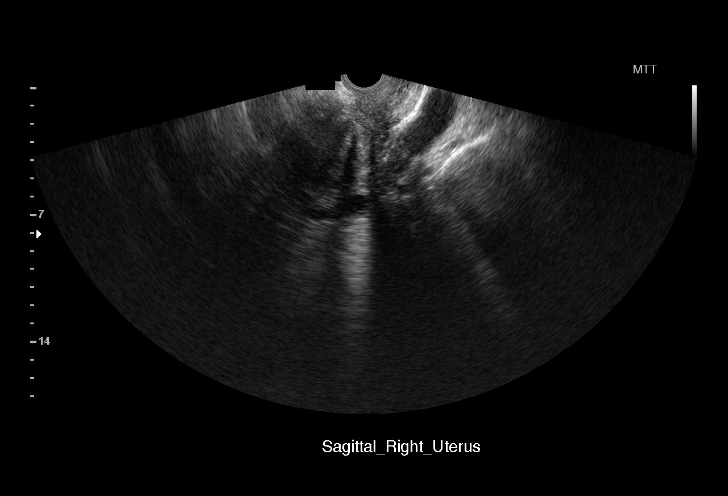
[im 71/106]
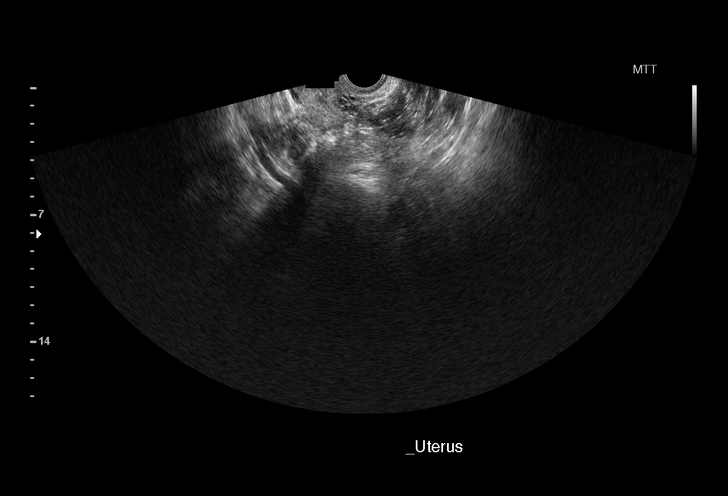
[im 79/106]
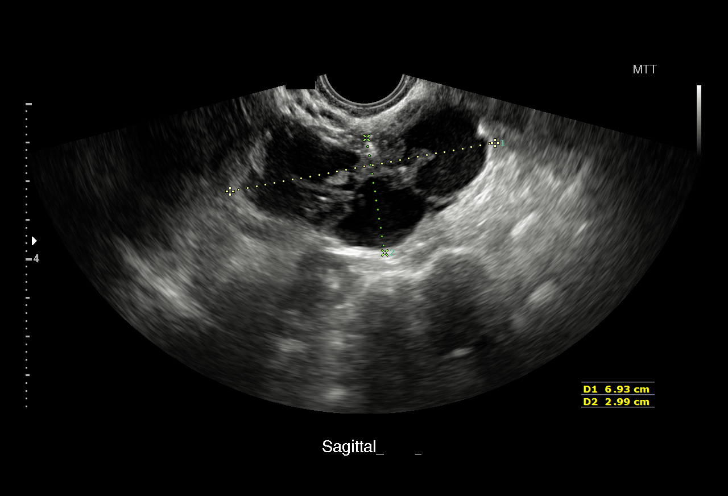
[im 88/106]
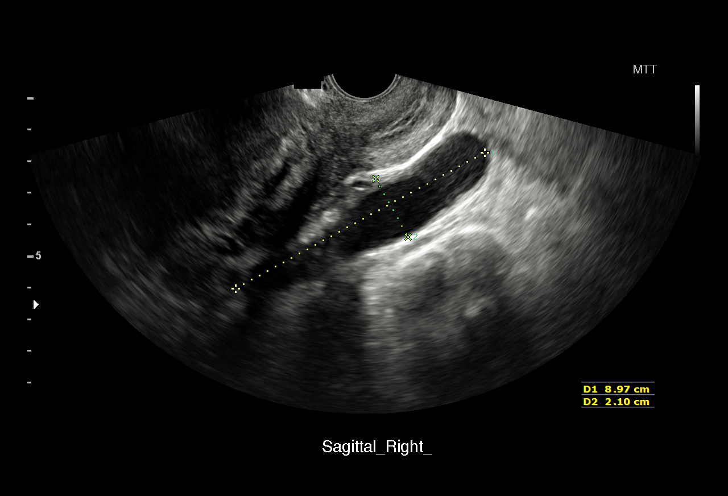
[im 97/106]
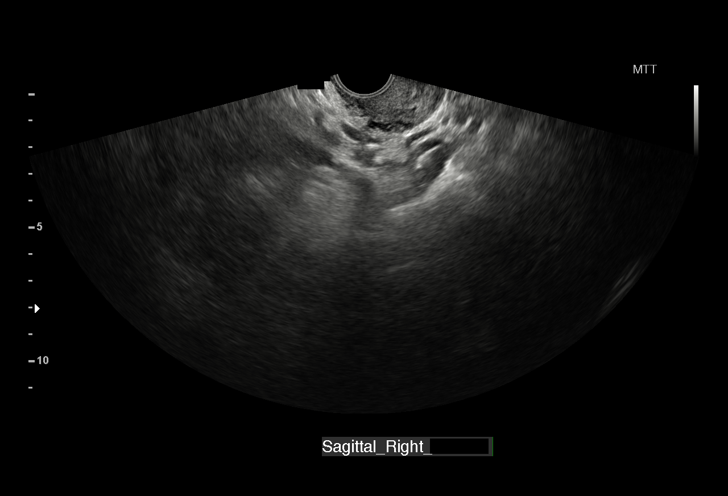
[im 106/106]
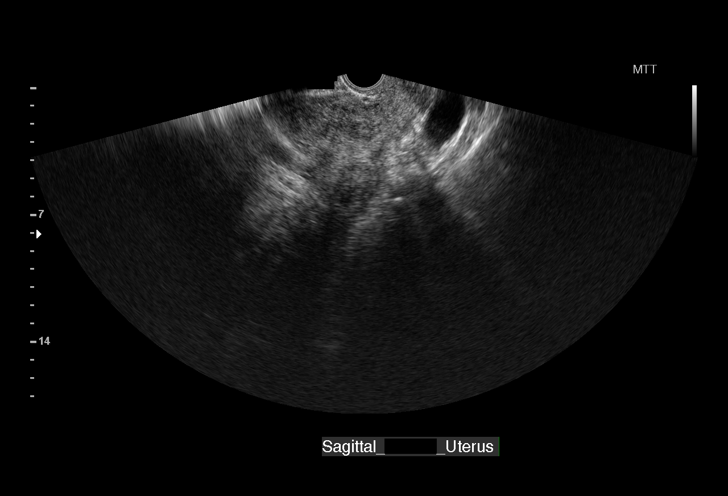

[14 of 25 positions shown; findings below may reference images not displayed]

FINDINGS: Uterus

Measurements: 18.6 x 8.2 x 12.9 cm. The anteverted uterus is
markedly enlarged by fibroids as follows:

- left posterior fundal intramural/ subserosal 9.1 x 8.3 x 9.1 cm
fibroid, previously 6.4 x 6.5 x 7.7 cm, increased in size

- left anterior uterine body intramural 3.9 x 3.4 x 3.4 cm fibroid
with likely 30-40% submucosal component, previously 3.7 x 3.1 x
cm, minimally increased in size

- right anterior uterine body subserosal 5.2 x 4.8 x 4.8 cm fibroid,
not previously described

Endometrium

Fluid is seen within the endometrial cavity. The endometrium is
poorly visualized due to distortion by the surrounding fibroids.

Right ovary

Measurements: 2.7 x 1.9 x 2.3 cm (transabdominal measurements). A
simple 2.2 cm cyst in the right ovary is in keeping with a dominant
right ovarian follicle. A tubular mildly thick walled cystic
structure in the right adnexa with low level internal echoes is in
keeping with a mild to moderate right hydrosalpinx, increased in
caliber.

Left ovary

Measurements: 3.1 x 3.6 x 3.6 cm. No suspicious left ovarian masses.
A tubular mildly thick-walled cystic structure in the left adnexa
with low level internal echoes is in keeping with a mild to moderate
left hydrosalpinx, not appreciably changed in caliber.

Other findings

No abnormal free fluid in the pelvis.
IMPRESSION: 1. Markedly enlarged myomatous uterus, with interval growth of the
uterine fibroids, including a dominant 9.1 cm posterior fundal
fibroid and a 3.9 cm left anterior uterine body fibroid that likely
contains a small submucosal component.
2. Fluid in the endometrial cavity. Nondiagnostic evaluation of the
endometrium due to distortion by the surrounding fibroids.
3. Bilateral mild-to-moderate hydrosalpinges. No suspicious ovarian
or adnexal masses.

## 2014-12-18 ENCOUNTER — Ambulatory Visit (INDEPENDENT_AMBULATORY_CARE_PROVIDER_SITE_OTHER): Payer: Self-pay | Admitting: Obstetrics & Gynecology

## 2014-12-18 ENCOUNTER — Encounter: Payer: Self-pay | Admitting: Obstetrics & Gynecology

## 2014-12-18 VITALS — BP 131/82 | HR 84 | Temp 99.1°F | Ht 59.0 in | Wt 197.0 lb

## 2014-12-18 DIAGNOSIS — D259 Leiomyoma of uterus, unspecified: Secondary | ICD-10-CM | POA: Insufficient documentation

## 2014-12-18 DIAGNOSIS — IMO0001 Reserved for inherently not codable concepts without codable children: Secondary | ICD-10-CM

## 2014-12-18 MED ORDER — LEUPROLIDE ACETATE (3 MONTH) 11.25 MG IM KIT: 11.2500 mg | PACK | Freq: Once | INTRAMUSCULAR | Status: DC

## 2014-12-18 MED ORDER — MEDROXYPROGESTERONE ACETATE 10 MG PO TABS: 20.0000 mg | ORAL_TABLET | Freq: Every day | ORAL | Status: DC

## 2014-12-18 NOTE — Progress Notes (Signed)
Patient ID: Mariah Lang, female   DOB: 1971/04/04, 44 y.o.   MRN: [id number]  Chief Complaint  Patient presents with  . Gynecologic Exam  heavy period since 8/11, still bleeding taking megace, US showed fibroid uterus, referred by [facility]  HPI Mariah Lang is a 44 y.o. female.  Mariah Lang Patient's last menstrual period was 11/13/2014 (exact date).   HPI  Past Medical History  Diagnosis Date  . Anemia     Past Surgical History  Procedure Laterality Date  . Other surgical history  1993    c-section  . Appendectomy    . Cesarean section  2000    No family history on file.  Social History Social History  Substance Use Topics  . Smoking status: Never Smoker   . Smokeless tobacco: None  . Alcohol Use: No    No Known Allergies  Current Outpatient Prescriptions  Medication Sig Dispense Refill  . ferrous sulfate 325 (65 FE) MG tablet Take 1 tablet (325 mg total) by mouth 3 (three) times daily with meals. 270 tablet 11  . ibuprofen (ADVIL,MOTRIN) 600 MG tablet Take 1 tablet (600 mg total) by mouth every 6 (six) hours as needed. 30 tablet 2  . medroxyPROGESTERone (PROVERA) 10 MG tablet Take 2 tablets (20 mg total) by mouth daily. 30 tablet 2  . megestrol (MEGACE) 20 MG tablet Take 6 tablets (120 mg) daily for 3 days, 4 tablets daily for 3 days (80 mg), then 1-2 tablets (20-40 mg) daily 40 tablet 1  . oxyCODONE-acetaminophen (PERCOCET/ROXICET) 5-325 MG per tablet Take by mouth every 4 (four) hours as needed for severe pain.    Marland Kitchen oxyCODONE-acetaminophen (PERCOCET/ROXICET) 5-325 MG per tablet Take 2 tablets by mouth every 4 (four) hours as needed for severe pain. 15 tablet 0   Current Facility-Administered Medications  Medication Dose Route Frequency Provider Last Rate Last Dose  . leuprolide (LUPRON) injection 11.25 mg  11.25 mg Intramuscular Once Woodroe Mode, MD        Review of Systems Review of Systems  Gastrointestinal: Negative.  Negative for nausea.   Genitourinary: Positive for vaginal bleeding, menstrual problem and pelvic pain. Negative for vaginal discharge.  Neurological: Positive for dizziness and headaches.       After starting Megace  Psychiatric/Behavioral: Negative.     Blood pressure 131/82, pulse 84, temperature 99.1 F (37.3 C), height 4\' 11"  (1.499 m), weight 197 lb (89.359 kg), last menstrual period 11/13/2014.  Physical Exam Physical Exam  Constitutional: She appears well-developed. No distress.  Pulmonary/Chest: Effort normal. No respiratory distress.  Abdominal: Soft. She exhibits mass.  Firm, lower abdomen  Genitourinary: Vagina normal.  Moderate blood, cervix nl, uterus 14 weeks size  Skin: Skin is warm and dry. No pallor.  Psychiatric: She has a normal mood and affect. Her behavior is normal.    Data Reviewed CLINICAL DATA: 44 year old female with irregular menstrual cycles and abnormal uterine bleeding on Depo-Provera and a history of uterine fibroids. Intermittent pelvic pain. LMP 11/13/2014, day 28 of menstrual cycle.  EXAM: TRANSABDOMINAL AND TRANSVAGINAL ULTRASOUND OF PELVIS  TECHNIQUE: Both transabdominal and transvaginal ultrasound examinations of the pelvis were performed. Transabdominal technique was performed for global imaging of the pelvis including uterus, ovaries, adnexal regions, and pelvic cul-de-sac. It was necessary to proceed with endovaginal exam following the transabdominal exam to visualize the endometrium, ovaries and adnexa.  COMPARISON: 08/31/2012 pelvic sonogram.  FINDINGS: Uterus  Measurements: 18.6 x 8.2 x 12.9 cm. The anteverted uterus is markedly enlarged by fibroids  as follows:  - left posterior fundal intramural/ subserosal 9.1 x 8.3 x 9.1 cm fibroid, previously 6.4 x 6.5 x 7.7 cm, increased in size  - left anterior uterine body intramural 3.9 x 3.4 x 3.4 cm fibroid with likely 30-40% submucosal component, previously 3.7 x 3.1 x 3.4 cm, minimally  increased in size  - right anterior uterine body subserosal 5.2 x 4.8 x 4.8 cm fibroid, not previously described  Endometrium  Fluid is seen within the endometrial cavity. The endometrium is poorly visualized due to distortion by the surrounding fibroids.  Right ovary  Measurements: 2.7 x 1.9 x 2.3 cm (transabdominal measurements). A simple 2.2 cm cyst in the right ovary is in keeping with a dominant right ovarian follicle. A tubular mildly thick walled cystic structure in the right adnexa with low level internal echoes is in keeping with a mild to moderate right hydrosalpinx, increased in caliber.  Left ovary  Measurements: 3.1 x 3.6 x 3.6 cm. No suspicious left ovarian masses. A tubular mildly thick-walled cystic structure in the left adnexa with low level internal echoes is in keeping with a mild to moderate left hydrosalpinx, not appreciably changed in caliber.  Other findings  No abnormal free fluid in the pelvis.  IMPRESSION: 1. Markedly enlarged myomatous uterus, with interval growth of the uterine fibroids, including a dominant 9.1 cm posterior fundal fibroid and a 3.9 cm left anterior uterine body fibroid that likely contains a small submucosal component. 2. Fluid in the endometrial cavity. Nondiagnostic evaluation of the endometrium due to distortion by the surrounding fibroids. 3. Bilateral mild-to-moderate hydrosalpinges. No suspicious ovarian or adnexal masses.   Electronically Signed  By: Ilona Sorrel M.D.  On: 12/10/2014 14:28 Chief Complaint  Patient presents with  . Gynecologic Exam   CBC    Component Value Date/Time   WBC 8.3 12/05/2014 1304   RBC 4.09 12/05/2014 1304   RBC 5.18* 04/19/2013 1434   HGB 10.4* 12/05/2014 1304   HCT 33.0* 12/05/2014 1304   PLT 412* 12/05/2014 1304   MCV 80.7 12/05/2014 1304   MCH 25.4* 12/05/2014 1304   MCHC 31.5 12/05/2014 1304   RDW 17.7* 12/05/2014 1304   LYMPHSABS 2.3 11/24/2014 1121    MONOABS 0.8 11/24/2014 1121   EOSABS 0.2 11/24/2014 1121   BASOSABS 0.0 11/24/2014 1121     Assessment    Large fibroid uterus, menometrorrhagia Not tolerating megace well   2 CS  Plan    Financial assistance application as she is a candidate for hysterectomy Change to Provera 20 mg/day Lupron 11.25 mg IM when available        Cyprian Gongaware 12/18/2014, 3:40 PM

## 2014-12-18 NOTE — Patient Instructions (Signed)
Fibroma uterino (Uterine Fibroid) Un fibroma uterino es un crecimiento (tumor) dentro del tero. Este tipo de tumor no es canceroso y no se extiende fuera del tero. Podr tener uno o varios fibromas. Los fibromas pueden variar en tamao, peso y el lugar en que se desarrollan dentro del tero. Algunos pueden llegar a ser bastante grandes. La mayora de los fibromas no necesitan tratamiento mdico, pero algunos pueden causar dolor o sangrado abundante durante los perodos y entre ellos. CAUSAS  Un fibroma es el resultado del desarrollo continuo de una nica clula uterina que sigue creciendo (no regulada) que es diferente al resto de las clulas del cuerpo humano. La mayora de las clulas tiene un mecanismo de control que evita que se reproduzcan de manera descontrolada.  SIGNOS Y SNTOMAS   Hemorragias.  Dolor y sensacin de presin en la pelvis.  Problemas en la vejiga debido al tamao del fibroma.  Infertilidad y abortos espontneos, segn el tamao y la ubicacin del fibroma. DIAGNSTICO  Los fibromas uterinos se diagnostican con un examen fsico. El mdico puede palpar los tumores abultados al realizar el examen de la pelvis. Una ecografa puede indicarse para tener informacin del tamao, la ubicacin y el nmero de tumores.  TRATAMIENTO   El mdico puede considerar que es conveniente esperar y prestar atencin. Esto incluye el control del fibroma por parte del mdico para observar si crece o disminuye su tamao.  Podr indicarle un tratamiento hormonal o el uso de un dispositivo intrauterino (DIU).  En algunos casos es necesaria la ciruga para extirpar el fibroma (miomectoma) o el tero (histerectoma). Esto depender de su situacin. Cuando una mujer desea quedar embarazada y los fibromas interfieren en su fertilidad, el mdico puede recomendar la extirpacin del fibroma.  INSTRUCCIONES PARA EL CUIDADO EN EL HOGAR  Los cuidados en el hogar dependen del tratamiento que haya  recibido. En general:   Cumpla con todas las visitas de control, segn le indique su mdico.  Tome slo medicamentos de venta libre o recetados, segn las indicaciones del mdico. Si le recetaron un tratamiento hormonal, tome los medicamentos hormonales como le indicaron. No tome aspirina. Puede ocasionar hemorragias.  Consulte al mdico si debe tomar pldoras de hierro.  Si sus perodos son molestos pero no tan abundantes, acustese con los pies ligeramente elevados por encima del nivel del corazn. Coloque compresas fras en la zona inferior del abdomen.  Si sus perodos son muy abundantes, anote el nmero de compresas o tampones que usa cada mes. Lleve esta informacin a su consulta mdica.  Incluya vegetales verdes en su dieta. SOLICITE ATENCIN MDICA DE INMEDIATO SI:  Siente dolor o clicos en la pelvis y no puede controlarlos con los medicamentos.  El dolor en la pelvis aumenta de manera repentina.  Aumenta el sangrado entre los perodos o durante los mismos.  Si tiene perodos muy abundantes y debe cambiar un tampn o una toalla higinica cada media hora o menos.  Se siente mareado o tiene episodios de desmayo. Document Released: 03/21/2005 Document Revised: 01/09/2013 ExitCare Patient Information 2015 [employer]. This information is not intended to replace advice given to you by your health care provider. Make sure you discuss any questions you have with your health care provider.  

## 2015-03-20 ENCOUNTER — Emergency Department (INDEPENDENT_AMBULATORY_CARE_PROVIDER_SITE_OTHER)
Admission: EM | Admit: 2015-03-20 | Discharge: 2015-03-20 | Disposition: A | Discharge status: Other Acute Inpatient Hospital | Payer: No Typology Code available for payment source | Source: Home / Self Care | Attending: Family Medicine | Admitting: Family Medicine

## 2015-03-20 ENCOUNTER — Encounter (HOSPITAL_COMMUNITY): Payer: Self-pay | Admitting: Emergency Medicine

## 2015-03-20 ENCOUNTER — Encounter (HOSPITAL_COMMUNITY): Payer: Self-pay | Admitting: *Deleted

## 2015-03-20 ENCOUNTER — Emergency Department (HOSPITAL_COMMUNITY)
Admission: EM | Admit: 2015-03-20 | Discharge: 2015-03-21 | Disposition: A | Discharge status: Home / Self Care | Payer: Self-pay | Attending: Emergency Medicine | Admitting: Emergency Medicine

## 2015-03-20 DIAGNOSIS — R000 Tachycardia, unspecified: Secondary | ICD-10-CM | POA: Insufficient documentation

## 2015-03-20 DIAGNOSIS — Z7982 Long term (current) use of aspirin: Secondary | ICD-10-CM | POA: Insufficient documentation

## 2015-03-20 DIAGNOSIS — J039 Acute tonsillitis, unspecified: Secondary | ICD-10-CM | POA: Insufficient documentation

## 2015-03-20 DIAGNOSIS — J029 Acute pharyngitis, unspecified: Secondary | ICD-10-CM

## 2015-03-20 LAB — POCT RAPID STREP A: STREPTOCOCCUS, GROUP A SCREEN (DIRECT): NEGATIVE

## 2015-03-20 NOTE — ED Notes (Addendum)
Pt. transferred from [facility] urgent care reports sore throat with fever onset last week with occipital headache , pain when swallowing , denies SOB Cherre Robins intact . Strep screen negative at urgent care.

## 2015-03-20 NOTE — ED Provider Notes (Signed)
CSN: [id number]     Arrival date & time 03/20/15  1831 History   First MD Initiated Contact with Patient 03/20/15 1918     Chief Complaint  Patient presents with  . Sore Throat   (Consider location/radiation/quality/duration/timing/severity/associated sxs/prior Treatment) Patient is a 44 y.o. female presenting with pharyngitis. The history is provided by the patient. The history is limited by a language barrier. A language interpreter was used.  Sore Throat This is a new problem. The current episode started more than 1 week ago. The problem has not changed since onset.Associated symptoms include headaches. Associated symptoms comments: Taking [place]  PCN for 1 week, not improved.. The symptoms are aggravated by swallowing.    History reviewed. No pertinent past medical history. History reviewed. No pertinent past surgical history. History reviewed. No pertinent family history. Social History  Substance Use Topics  . Smoking status: Never Smoker   . Smokeless tobacco: None  . Alcohol Use: No   OB History    No data available     Review of Systems  Constitutional: Positive for fever, chills and appetite change.  HENT: Positive for sore throat and trouble swallowing.   Neurological: Positive for headaches.    Allergies  Review of patient's allergies indicates no known allergies.  Home Medications   Prior to Admission medications   Medication Sig Start Date End Date Taking? Authorizing Provider  Aspirin-Acetaminophen-Caffeine (GOODY HEADACHE PO) Take by mouth.   Yes Historical Provider, MD   Meds Ordered and Administered this Visit  Medications - No data to display  BP 130/80 mmHg  Pulse 80  Temp(Src) 102.1 F (38.9 C) (Oral)  Resp 16  SpO2 100% No data found.   Physical Exam  Constitutional: She is oriented to person, place, and time. She appears well-developed and well-nourished. She appears distressed.  HENT:  Right Ear: External ear normal.  Left Ear:  External ear normal.  Mouth/Throat: Uvula is midline and mucous membranes are normal. No uvula swelling. Oropharyngeal exudate and posterior oropharyngeal erythema present. No tonsillar abscesses.  Eyes: Conjunctivae are normal. Pupils are equal, round, and reactive to light.  Neck: Normal range of motion. Neck supple.  Cardiovascular: Regular rhythm and normal heart sounds.   Pulmonary/Chest: Breath sounds normal.  Lymphadenopathy:    She has no cervical adenopathy.  Neurological: She is alert and oriented to person, place, and time.  Skin: Skin is warm and dry.  Nursing note and vitals reviewed.   ED Course  Procedures (including critical care time)  Labs Review Labs Reviewed  POCT RAPID STREP A  strep neg.  Imaging Review No results found.   Visual Acuity Review  Right Eye Distance:   Left Eye Distance:   Bilateral Distance:    Right Eye Near:   Left Eye Near:    Bilateral Near:         MDM   1. Sore throat    Sent for ent eval of adult with t102 and strep neg with headache occip, and having taken pcn from [place] for 1 week but getting worse.   Billy Fischer, MD 03/20/15 [phone]

## 2015-03-20 NOTE — ED Notes (Addendum)
sorethroat      X   1  Weekend           Headache  X  1   Week          No    Vomiting        Has  Been taking   Taking    Anti  Biotics          From  [place]  Store      She  Also  Reports  Pain      In  Neck        As   Well     she  Reports    Pain  When  She  Swallows

## 2015-03-21 ENCOUNTER — Emergency Department (HOSPITAL_COMMUNITY): Payer: No Typology Code available for payment source

## 2015-03-21 LAB — I-STAT CHEM 8, ED
BUN: 10 mg/dL (ref 6–20)
CALCIUM ION: 1.09 mmol/L — AB (ref 1.12–1.23)
CHLORIDE: 103 mmol/L (ref 101–111)
Creatinine, Ser: 0.5 mg/dL (ref 0.44–1.00)
GLUCOSE: 103 mg/dL — AB (ref 65–99)
HCT: 34 % — ABNORMAL LOW (ref 36.0–46.0)
Hemoglobin: 11.6 g/dL — ABNORMAL LOW (ref 12.0–15.0)
Potassium: 3.6 mmol/L (ref 3.5–5.1)
Sodium: 136 mmol/L (ref 135–145)
TCO2: 22 mmol/L (ref 0–100)

## 2015-03-21 LAB — CBC WITH DIFFERENTIAL/PLATELET
Basophils Absolute: 0 10*3/uL (ref 0.0–0.1)
Basophils Relative: 0 %
EOS PCT: 0 %
Eosinophils Absolute: 0 10*3/uL (ref 0.0–0.7)
HCT: 31.8 % — ABNORMAL LOW (ref 36.0–46.0)
Hemoglobin: 9.7 g/dL — ABNORMAL LOW (ref 12.0–15.0)
LYMPHS ABS: 2.2 10*3/uL (ref 0.7–4.0)
LYMPHS PCT: 12 %
MCH: 21.4 pg — AB (ref 26.0–34.0)
MCHC: 30.5 g/dL (ref 30.0–36.0)
MCV: 70 fL — AB (ref 78.0–100.0)
Monocytes Absolute: 1.3 10*3/uL — ABNORMAL HIGH (ref 0.1–1.0)
Monocytes Relative: 7 %
Neutro Abs: 14.8 10*3/uL — ABNORMAL HIGH (ref 1.7–7.7)
Neutrophils Relative %: 80 %
PLATELETS: 477 10*3/uL — AB (ref 150–400)
RBC: 4.54 MIL/uL (ref 3.87–5.11)
RDW: 15.9 % — ABNORMAL HIGH (ref 11.5–15.5)
WBC: 18.3 10*3/uL — AB (ref 4.0–10.5)

## 2015-03-21 MED ORDER — HYDROCODONE-ACETAMINOPHEN 7.5-325 MG/15ML PO SOLN: 10.0000 mL | Freq: Four times a day (QID) | ORAL | Status: DC | PRN

## 2015-03-21 MED ORDER — IOHEXOL 300 MG/ML  SOLN: 80.0000 mL | Freq: Once | INTRAMUSCULAR | Status: DC | PRN

## 2015-03-21 MED ORDER — KETOROLAC TROMETHAMINE 30 MG/ML IJ SOLN
30.0000 mg | Freq: Once | INTRAMUSCULAR | Status: AC
  Administered 2015-03-21: 30 mg via INTRAVENOUS
  Filled 2015-03-21: qty 1

## 2015-03-21 MED ORDER — SODIUM CHLORIDE 0.9 % IV BOLUS (SEPSIS)
500.0000 mL | Freq: Once | INTRAVENOUS | Status: AC
  Administered 2015-03-21: 500 mL via INTRAVENOUS

## 2015-03-21 MED ORDER — CLINDAMYCIN PHOSPHATE 900 MG/50ML IV SOLN
900.0000 mg | Freq: Once | INTRAVENOUS | Status: AC
  Administered 2015-03-21: 900 mg via INTRAVENOUS
  Filled 2015-03-21: qty 50

## 2015-03-21 MED ORDER — FENTANYL CITRATE (PF) 100 MCG/2ML IJ SOLN
50.0000 ug | Freq: Once | INTRAMUSCULAR | Status: AC
  Administered 2015-03-21: 50 ug via INTRAVENOUS
  Filled 2015-03-21: qty 2

## 2015-03-21 MED ORDER — CLINDAMYCIN HCL 150 MG PO CAPS: 300.0000 mg | ORAL_CAPSULE | Freq: Three times a day (TID) | ORAL | Status: DC

## 2015-03-21 NOTE — ED Notes (Signed)
Patient transported to CT 

## 2015-03-21 NOTE — Discharge Instructions (Signed)
Amigdalitis (Tonsillitis) La amigdalitis es una infeccin de la garganta. Esta infeccin hace que las amgdalas se vuelvan sensibles, rojas e inflamadas (hinchadas). Las amgdalas son grupos de tejido que se encuentran en la zona posterior de la garganta. Si la infeccin fue causada por una infeccin, le indicarn que tome antibiticos. A veces, los sntomas pueden aliviarse con el uso de corticoides. Si la amigdalitis es grave y ocurre con Camera operator, puede ser necesario extirpar las amgdalas (amigdalectoma). CUIDADOS EN EL HOGAR   Haga reposo y duerma con frecuencia.  Beba suficiente lquido para mantener el pis (orina) claro o de color amarillo plido.  Mientras le duela la garganta, consuma alimentos blandos o lquidos como:  Sopa.  Helados.  Desayuno instantneo.  Tome helados de agua.  Puede hacerse grgaras con lquidos tibios o fros para Dietitian. Hgase grgaras con una mezcla de agua con sal. Mezcle 1/4 de cucharadita de sal y 1/4 de cucharadita de bicarbonato de sodio en 1 taza de agua.  Solo tome los medicamentos que le haya indicado su mdico.  Si le han recetado medicamentos (antibiticos), tmelos segn las indicaciones. Tmelos todos, aunque se sienta mejor. SOLICITE AYUDA SI:  Tiene bultos grandes y dolorosos en el cuello.  Tiene una erupcin cutnea.  Tiene un catarro verde, amarillo amarronado o con [place].  No puede tragar lquidos o alimentos durante 24 horas.  Nota que solo una de las amgdalas est hinchada. SOLICITE AYUDA DE INMEDIATO SI:   Devuelve (vomita).  Siente un dolor de cabeza muy intenso.  Presenta rigidez en el cuello.  Siente dolor en el pecho.  Tiene problemas para respirar o tragar.  Tiene dolor de garganta intenso, babeo o cambios en la voz.  Siente un dolor intenso y no lo [employer].  No puede abrir completamente la boca.  Tiene enrojecimiento, hinchazn o dolor fuerte en el cuello.  Tiene  fiebre. ASEGRESE DE QUE:   Comprende estas instrucciones.  Controlar su afeccin.  Recibir ayuda de inmediato si no mejora o si empeora.   Esta informacin no tiene Marine scientist el consejo del mdico. Asegrese de hacerle al mdico cualquier pregunta que tenga.   Document Released: 03/10/2011 Document Revised: 03/26/2013 Elsevier Interactive Patient Education [employer].

## 2015-03-21 NOTE — ED Provider Notes (Signed)
CSN: [id number]     Arrival date & time 03/20/15  1957 History   First MD Initiated Contact with Patient 03/20/15 2345     Chief Complaint  Patient presents with  . Sore Throat      Patient is a 44 y.o. female presenting with pharyngitis. The history is provided by the patient.  Sore Throat Pertinent negatives include no chest pain, no abdominal pain and no shortness of breath.   patient presents with sore throat for the last week. Some nasal congestion. Pain worse with swallowing. Some difficulty swallowing. Has been on ampicillin that she got from [place]. States she's having high fevers. No cough. States she is having some difficulty with her swallowing. No nausea vomiting. No sick contacts. Some pain goes to the back of her neck 2. Seen in urgent care and had negative strep test there.  Patient speaks Spanish and translator films were used.  History reviewed. No pertinent past medical history. History reviewed. No pertinent past surgical history. No family history on file. Social History  Substance Use Topics  . Smoking status: Never Smoker   . Smokeless tobacco: None  . Alcohol Use: No   OB History    No data available     Review of Systems  Constitutional: Negative for chills.  HENT: Positive for congestion, sore throat and trouble swallowing. Negative for voice change.   Respiratory: Negative for shortness of breath.   Cardiovascular: Negative for chest pain.  Gastrointestinal: Negative for abdominal pain.  Genitourinary: Negative for difficulty urinating.  Musculoskeletal: Positive for neck pain.  Skin: Negative for wound.      Allergies  Review of patient's allergies indicates no known allergies.  Home Medications   Prior to Admission medications   Medication Sig Start Date End Date Taking? Authorizing Provider  Aspirin-Acetaminophen-Caffeine (GOODY HEADACHE PO) Take by mouth.    Historical Provider, MD   BP 152/86 mmHg  Pulse 100  Temp(Src) 100 F (37.8  C) (Oral)  Resp 20  Ht 4\' 11"  (1.499 m)  Wt 188 lb (85.276 kg)  BMI 37.95 kg/m2  SpO2 100% Physical Exam  Constitutional: She appears well-developed.  HENT:  Bilateral swollen tonsils with some mild exudate bilaterally. Uvula midline.  Neck:  Anterior cervical lymphadenopathy bilaterally. No meningismus. Good rotation of neck.  Cardiovascular:  Mild tachycardia  Pulmonary/Chest: Effort normal.  Abdominal: Soft. There is no tenderness.  Lymphadenopathy:    She has cervical adenopathy.  Neurological: She is alert.  Skin: Skin is warm.    ED Course  Procedures (including critical care time) Labs Review Labs Reviewed  CULTURE, GROUP A STREP  CBC WITH DIFFERENTIAL/PLATELET  I-STAT CHEM 8, ED    Imaging Review No results found. I have personally reviewed and evaluated these images and lab results as part of my medical decision-making.   EKG Interpretation None      MDM   Final diagnoses:  None    Patient with sore throat. Has had fevers up to 102. Reported negative strep test and has been on antibiotics. There is some language barrier and CT scan will be ordered.    Davonna Belling, MD 03/21/15 [phone]

## 2015-03-22 LAB — CULTURE, GROUP A STREP: STREP A CULTURE: NEGATIVE

## 2015-07-29 LAB — GLUCOSE, POCT (MANUAL RESULT ENTRY): POC Glucose: 89 mg/dl (ref 70–99)

## 2015-08-27 ENCOUNTER — Ambulatory Visit: Payer: No Typology Code available for payment source | Attending: Internal Medicine | Admitting: Internal Medicine

## 2015-08-27 ENCOUNTER — Encounter: Payer: Self-pay | Admitting: Internal Medicine

## 2015-08-27 VITALS — BP 110/71 | HR 71 | Temp 98.5°F | Ht 59.0 in | Wt 194.4 lb

## 2015-08-27 DIAGNOSIS — D500 Iron deficiency anemia secondary to blood loss (chronic): Secondary | ICD-10-CM | POA: Insufficient documentation

## 2015-08-27 DIAGNOSIS — N924 Excessive bleeding in the premenopausal period: Secondary | ICD-10-CM

## 2015-08-27 DIAGNOSIS — Z79899 Other long term (current) drug therapy: Secondary | ICD-10-CM | POA: Insufficient documentation

## 2015-08-27 LAB — CBC WITH DIFFERENTIAL/PLATELET
BASOS ABS: 85 {cells}/uL (ref 0–200)
BASOS PCT: 1 %
EOS ABS: 255 {cells}/uL (ref 15–500)
Eosinophils Relative: 3 %
HEMATOCRIT: 30.1 % — AB (ref 35.0–45.0)
Hemoglobin: 8.5 g/dL — ABNORMAL LOW (ref 11.7–15.5)
LYMPHS PCT: 28 %
Lymphs Abs: 2380 cells/uL (ref 850–3900)
MCH: 18.6 pg — ABNORMAL LOW (ref 27.0–33.0)
MCHC: 28.2 g/dL — AB (ref 32.0–36.0)
MCV: 66 fL — AB (ref 80.0–100.0)
MONO ABS: 765 {cells}/uL (ref 200–950)
MPV: 9 fL (ref 7.5–12.5)
Monocytes Relative: 9 %
NEUTROS PCT: 59 %
Neutro Abs: 5015 cells/uL (ref 1500–7800)
Platelets: 564 10*3/uL — ABNORMAL HIGH (ref 140–400)
RBC: 4.56 MIL/uL (ref 3.80–5.10)
RDW: 18.4 % — AB (ref 11.0–15.0)
WBC: 8.5 10*3/uL (ref 3.8–10.8)

## 2015-08-27 MED ORDER — ACETAMINOPHEN-CODEINE #4 300-60 MG PO TABS: 1.0000 | ORAL_TABLET | ORAL | Status: DC | PRN

## 2015-08-27 NOTE — Patient Instructions (Signed)
Dismenorrea  (Dysmenorrhea)  Los cólicos menstruales (dismenorrea) se originan en los espasmos musculares del útero (contracciones) durante el período menstrual. En algunas mujeres, el malestar es sólo una molestia. En otras, la dismenorrea puede ser tan intensa que interfiere con las actividades diarias durante algunos días, todos los meses.  La dismenorrea primaria son los cólicos menstruales que duran algunos días al inicio de los períodos o poco después. En general se inician después de que la adolescente comienza a tener sus períodos. A medida que la mujer avanza en edad, o tiene un bebé, los cólicos generalmente disminuyen o desaparecen. La dismenorrea secundaria comienza en épocas posteriores de la vida, dura más tiempo, y el dolor puede ser más intenso. El dolor puede comenzar antes del período y durar hasta algunos días después.   CAUSAS   La causa de la dismenorrea generalmente es un problema subyacente, por ejemplo:  · El tejido que recubre interiormente al útero crece fuera del útero en otras áreas del cuerpo (endometriosis).  · The endometrial tissue, which normally lines the uterus, is found in or grows into the muscular walls of the uterus (adenomyosis).  · Los vasos sanguíneos de la pelvis se congestionan con sangre antes del período menstrual (síndrome congestivo pélvico).  · Crecimiento excesivo de las células (pólipos) del revestimiento interior del útero o del cuello uterino.  · Caída del útero (prolapso) debido a distensión o flojedad de los ligamentos.  · Depresión.  · Problemas como infección o inflamación en la vejiga.  · Problemas en el intestino, un tumor, o síndrome de colon irritable.  · Cáncer de los órganos femeninos o en la vejiga.  · Un útero muy inclinado.  · Una apertura muy estrecha o el cuello uterino cerrado.  · Tumores no cancerosos en el útero (fibromas).  · Enfermedad pélvica inflamatoria (EPI)  · Cicatrices en la pelvis (adherencias) por cirugías previas.  · Quiste  ovárico  · El uso del dispositivo intrauterino (DIU) como método anticonceptivo.  FACTORES DE RIESGO  Puede tener más riesgo de dismenorrea si:  · Tiene menos de 30 años.  · La pubertad se inició temprano.  · Tiene sangrado irregular o abundante.  · No ha tenido hijos.  · Tiene una historia familiar de este problema.  · Es fumadora.  SIGNOS Y SÍNTOMAS   · Cólicos y dolor punzante en la zona inferior del abdomen.  · Dolores de cabeza.  · Dolor de cintura.  · Náuseas o vómitos.  · Diarrea.  · Sudoración o mareos.  · Heces blandas.  DIAGNÓSTICO   El diagnóstico se basa en la historia personal, los síntomas, el examen físico, las pruebas diagnósticas o procedimientos. Las pruebas diagnósticas o procedimientos pueden ser:  · Análisis de sangre.  · Ecografías.  · Examen de las capas que cubren el útero (dilatación y curetaje, D&C).  · Examen de la zona interna del abdomen o la pelvis con un laparoscopio.  · Radiografías.  · Tomografía computada.  · Resonancia magnética.  · Un examen del interior de la vejiga con un citoscopio.  · Examen del interior del intestino o el estómago con un dispositivo especial (colonoscopio o un gastroscopio).  TRATAMIENTO   El tratamiento depende de la causa de la dismenorrea. El tratamiento puede incluir:  · Analgésicos indicados por su médico.  · Píldoras anticonceptivas o un DIU con progesterona.  · Terapia de reemplazo hormonal.  · Antiinflamatorios no esteroides (AINE). Los antiinflamatorios pueden detener la producción de prostaglandinas.  · Cirugía para extirpar   psicoterapia individual o de  grupo.  Actividad fsica y fisioterapia.  Meditacin y yoga.  Acupuntura. [place] slo medicamentos de venta libre o recetados, segn las indicaciones del mdico.  Coloque una almohadilla elctrica o una botella con agua caliente en la zona inferior del abdomen. No duerma con la almohadilla trmica.  Haga ejercicios aerbicos como caminar, nadar, andar en bicicleta, para [employer].  Masajear la zona inferior de la espalda o el abdomen puede ser de [place].  Deje de fumar.  Evite la cafena y el alcohol. SOLICITE ATENCIN MDICA SI:   El dolor no mejora con los medicamentos recetados.  Tiene dolor durante las [employer].  El dolor aumenta y no puede controlarlo con los medicamentos.  Observa una hemorragia vaginal anormal con el perodo.  Tiene nuseas o vmitos con el perodo menstrual que no puede controlar con medicamentos. SOLICITE ATENCIN MDICA DE INMEDIATO SI:  Se desmaya.    Esta informacin no tiene Marine scientist el consejo del mdico. Asegrese de hacerle al mdico cualquier pregunta que tenga.   Document Released: 12/29/2004 Document Revised: 11/21/2012 Elsevier Interactive Patient Education 2016 [place] (Menorrhagia) Se llama menorragia a los perodos menstruales abundantes o que duran ms de lo habitual. En la menorragia, la prdida de [place] y los clicos en cada perodo pueden hacerle imposible seguir con sus actividades habituales. CAUSAS  En algunos casos, la causa de los perodos abundantes es desconocida, pero hay algunas afecciones que pueden causar Psychologist, educational. Las causas ms frecuentes son:  Un problema con la tiroides, que es la glndula productora de hormonas (hipotiroidismo).  Formaciones no cancerosas en el tero (plipos o fibromas).  Un desequilibrio entre las hormonas estrgeno y Immunologist.  Uno de sus ovarios no libera vulos durante uno o ms  meses.  Efectos secundarios por haberse colocado un dispositivo intrauterino (DIU).  Efectos secundarios por algunos medicamentos, como antiinflamatorios o anticoagulantes.  Trastornos hemorrgicos que impiden la Transport planner. SIGNOS Y SNTOMAS  Durante un perodo normal, el sangrado dura entre 4 y 22 das. Los signos de que el perodo es muy abundante son:  [place] rutinaria tiene que cambiar el apsito o el tampn cada 1 o 2 horas debido a que est completamente empapado.  Elimina cogulos ms grandes de 1 pulgada (2,5 cm).  Tiene sangrado durante ms de 7 das.  Necesita usar apsitos y tampones al mismo tiempo porque pierde [employer].  Debe levantarse para cambiarse el apsito o el tampn durante la noche.  Tiene sntomas de anemia como cansancio, fatiga o falta de aire. DIAGNSTICO  El [employer] har un examen fsico y le har preguntas sobre sus sntomas y su historia menstrual. Podr indicarle otros estudios segn lo que encuentre Social worker. Estos estudios pueden ser:  Anlisis de [place]. Los [employer] de sangre se usan para verificar si est embarazada o tiene cambios hormonales, un trastorno tiroideo o de sangrado, niveles bajos de hierro (anemia) u otros problemas.  Biopsia de endometrio. El mdico tomar [place] de tejido del interior del tero para que sea examinado con un microscopio.  Ecografa plvica. Este estudio [place] ondas de sonido para tomar imgenes del tero, los ovarios y Geneticist, molecular. Las imgenes pueden mostrar si tiene fibromas u otros crecimientos.  Histeroscopa. Para este estudio, el mdico usar un pequeo telescopio para Chemical engineer interior del tero. Segn los [employer] estudios iniciales, el mdico podr indicar ms [employer]. TRATAMIENTO  Puede ser que no sea necesario un tratamiento mdico. Si lo necesita, el mdico primero podr recomendarle un tratamiento con uno o ms medicamentos. Si no se reduce el sangrado lo  suficiente, el tratamiento quirrgico podra ser [employer]. El mejor tratamiento para usted depender de:   Si necesita evitar un embarazo.  Si desea tener hijos en el futuro.  La causa y la gravedad del sangrado.  Su opinin o preferencia personal. Algunos medicamentos para la menorragia son:  Mtodos anticonceptivos que contengan hormonas. Estos incluyen la pldora anticonceptiva, el parche en la piel, el anillo vaginal, las inyecciones que se aplican cada 3 meses, el DIU hormonal y el implante. Estos tratamientos reducen el sangrado durante el perodo menstrual.  Medicamentos que espesan la sangre y hacen ms lento el sangrado.  Medicamentos que reducen la inflamacin, como el ibuprofeno.  Medicamentos que contienen una hormona sinttica llamada progestina.  Medicamentos que [employer] ovarios dejen de funcionar durante un breve lapso. Podra ser necesario un tratamiento quirrgico para la menorragia si los medicamentos no son eficaces. Las opciones de tratamiento incluyen:  Dilatacin y curetaje (D y C). En este procedimiento, el mdico abre (dilata) el cuello del tero y luego raspa o succiona tejido del revestimiento interior del tero para reducir el sangrado menstrual.  Histeroscopa quirrgica. En este procedimiento, se utiliza un pequeo tubo con [place] (histeroscopio) para observar la cavidad uterina y ayudar en la extirpacin quirrgica de un plipo que puede ser la causa de perodos abundantes.  Ablacin del endometrio. Por medio de [employer], el mdico destruye de [place] todo el revestimiento interno del tero (endometrio). Luego de la ablacin del endometrio, la mayora de las mujeres tienen escaso flujo menstrual, o no lo tienen. La ablacin del endometrio reduce la posibilidad de quedar embarazada.  Reseccin del endometrio. En este procedimiento quirrgico, se [place] un asa de Environmental manager para extirpar el revestimiento interno del  tero. Este procedimiento tambin reduce la posibilidad de [place].  Histerectoma. La remocin [place] del tero y el cuello del tero es un procedimiento permanente que detiene los perodos [place]. El embarazo no es posible luego de Physicist, medical. Este procedimiento requiere de anestesia y hospitalizacin. [place] solo medicamentos de venta libre o recetados, segn las indicaciones del [place] todos los medicamentos recetados exactamente como se le indic. No cambie ni reemplace los medicamentos sin consultarlo con el mdico.  Tome los comprimidos de hierro recetados, Scientist, water quality segn las indicaciones del mdico. Las hemorragias de larga duracin pueden traer como consecuencia una disminucin en los niveles de hierro. Los comprimidos de hierro ayudan a Camera operator hierro que el organismo pierde luego de un sangrado abundante. El hierro puede causarle estreimiento. Si esto es un problema, aumente el consumo de [place], frutas y [place].  No tome aspirina ni medicamentos que contengan aspirina desde 1 semana antes ni durante el perodo menstrual. La aspirina puede hacer que la hemorragia empeore.  Si necesita cambiar el apsito o el tampn ms de una vez cada 2horas, Nature conservation officer en cama y descanse todo lo posible hasta que la hemorragia se detenga.  Siga una dieta balanceada. Consuma alimentos ricos en hierro. Por ejemplo, vegetales de [employer], carne, hgado, huevos y panes y Actor de grano entero. No trate de perder peso hasta que la hemorragia anormal se detenga y los niveles de hierro en la sangre vuelvan a la normalidad. SOLICITE ATENCIN MDICA SI:   Empapa un  tampn o un apsito cada 1 o 2 horas, y [employer] ocurre cada vez que tiene el perodo.  Necesita usar apsitos y tampones al mismo tiempo porque pierde [employer].  Debe cambiarse el apsito o el tampn durante la noche.  Tiene un perodo que dura ms de 8  das.  Elimina cogulos de ms de 1 pulgada (2,5 cm).  Tiene perodos irregulares que ocurren ms o menos de una vez al mes.  Se siente mareada o se desmaya.  Se siente muy dbil o cansada.  Le falta el aire o siente que el corazn late muy rpido al hacer [place].  Tiene nuseas y vmitos o diarrea mientras toma los medicamentos.  Tiene algn problema que puede estar relacionado con el medicamento que est tomando. SOLICITE ATENCIN MDICA DE INMEDIATO SI:   Empapa 4 o ms apsitos o tampones en 2 horas.  Tiene sangrado y est embarazada. ASEGRESE DE QUE:   Comprende estas instrucciones.  Controlar su afeccin.  Recibir ayuda de inmediato si no mejora o si empeora.   Esta informacin no tiene Marine scientist el consejo del mdico. Asegrese de hacerle al mdico cualquier pregunta que tenga.   Document Released: 12/29/2004 Document Revised: 03/26/2013 Elsevier Interactive Patient Education [employer].

## 2015-08-27 NOTE — Progress Notes (Signed)
Patient ID: Mariah Lang, female   DOB: 13-May-1970, 45 y.o.   MRN: [id number]   Mariah Lang, is a 45 y.o. female  [id number]  [id number]  DOB - Oct 26, 1970  No chief complaint on file.       Subjective:   Mariah Lang is a 45 y.o. female with history of menorrhagia and chronic anemia here today for a follow up visit and repeat CBC. Patient has heavy menstrual bleeding from uterine fibroids and polyps since August 2011 and has had chronic iron deficiency anemia on iron supplement. Patient is a candidate for hysterectomy, currently going through financial assistance application. She is on Lupron and Provera, followed up by gynecologist. She has no new complaint today. She is still having heavy menstrual bleeding with occasional cramps. No fainting attack. She denies any depression. Patient has No headache, No chest pain, No abdominal pain - No Nausea, No new weakness tingling or numbness, No Cough - SOB.  ALLERGIES: No Known Allergies  PAST MEDICAL HISTORY: Past Medical History  Diagnosis Date  . Anemia     MEDICATIONS AT HOME: Prior to Admission medications   Medication Sig Start Date End Date Taking? Authorizing Provider  medroxyPROGESTERone (PROVERA) 10 MG tablet Take 2 tablets (20 mg total) by mouth daily. 12/18/14  Yes Woodroe Mode, MD  acetaminophen-codeine (TYLENOL #4) 300-60 MG tablet Take 1 tablet by mouth every 4 (four) hours as needed for moderate pain. 08/27/15   Tresa Garter, MD  ferrous sulfate 325 (65 FE) MG tablet Take 1 tablet (325 mg total) by mouth 3 (three) times daily with meals. Patient not taking: Reported on 08/27/2015 11/24/14   Tresa Garter, MD  ibuprofen (ADVIL,MOTRIN) 600 MG tablet Take 1 tablet (600 mg total) by mouth every 6 (six) hours as needed. Patient not taking: Reported on 08/27/2015 12/05/14   Kathie Dike Leftwich-Kirby, CNM  megestrol (MEGACE) 20 MG tablet Take 6 tablets (120 mg) daily for 3 days, 4 tablets daily for 3  days (80 mg), then 1-2 tablets (20-40 mg) daily Patient not taking: Reported on 08/27/2015 12/05/14   Kathie Dike Leftwich-Kirby, CNM     Objective:   Filed Vitals:   08/27/15 1603  BP: 110/71  Pulse: 71  Temp: 98.5 F (36.9 C)  TempSrc: Oral  Height: 4\' 11"  (1.499 m)  Weight: 194 lb 6.4 oz (88.179 kg)  SpO2: 98%    Exam General appearance : Awake, alert, not in any distress. Speech Clear. Not toxic looking, obese HEENT: Atraumatic and Normocephalic, pupils equally reactive to light and accomodation Neck: supple, no JVD. No cervical lymphadenopathy.  Chest: Good air entry bilaterally, no added sounds  CVS: S1 S2 regular, no murmurs.  Abdomen: Bowel sounds present, Non tender and not distended with no gaurding, rigidity or rebound. Extremities: B/L Lower Ext shows no edema, both legs are warm to touch Neurology: Awake alert, and oriented X 3, CN II-XII intact, Non focal Skin: No Rash  Data Review Lab Results  Component Value Date   HGBA1C 5.10 09/02/2014   HGBA1C 5.3 02/15/2013     Assessment & Plan   1. Menorrhagia, premenopausal  - COMPLETE METABOLIC PANEL WITH GFR - For hysterectomy once financial assistance application is completed and approved.  2. Iron deficiency anemia due to chronic blood loss  - CBC with Differential/Platelet  Patient have been counseled extensively about nutrition and exercise  Interpreter was used to communicate directly with patient for the entire encounter including providing detailed patient instructions.   Return  in about 6 months (around 02/27/2016) for Abdominal Pain, Menorrhagia.  The patient was given clear instructions to go to ER or return to medical center if symptoms don't improve, worsen or new problems develop. The patient verbalized understanding. The patient was told to call to get lab results if they haven't heard anything in the next week.   This note has been created with Automotive engineer. Any transcriptional errors are unintentional.    Angelica Chessman, MD, [place], Karilyn Cota, [place] and [place], [place]   08/27/2015, 4:48 PM

## 2015-08-27 NOTE — Progress Notes (Signed)
Pt comes in for anemia treatment and follow up. Refill oxycodone

## 2015-08-28 LAB — COMPLETE METABOLIC PANEL WITH GFR
ALT: 8 U/L (ref 6–29)
AST: 13 U/L (ref 10–30)
Albumin: 3.7 g/dL (ref 3.6–5.1)
Alkaline Phosphatase: 49 U/L (ref 33–115)
BUN: 13 mg/dL (ref 7–25)
CALCIUM: 8.5 mg/dL — AB (ref 8.6–10.2)
CHLORIDE: 107 mmol/L (ref 98–110)
CO2: 21 mmol/L (ref 20–31)
CREATININE: 0.51 mg/dL (ref 0.50–1.10)
GFR, Est Non African American: >89 mL/min (ref >=60)
Glucose, Bld: 87 mg/dL (ref 65–99)
Potassium: 3.8 mmol/L (ref 3.5–5.3)
Sodium: 138 mmol/L (ref 135–146)
Total Bilirubin: 0.2 mg/dL (ref 0.2–1.2)
Total Protein: 6.7 g/dL (ref 6.1–8.1)

## 2015-09-08 ENCOUNTER — Telehealth: Payer: Self-pay | Admitting: *Deleted

## 2015-09-08 NOTE — Telephone Encounter (Signed)
-----   Message from Tresa Garter, MD sent at 09/01/2015  5:27 PM EDT ----- Please inform patient that laboratory results are mostly normal, but hemoglobin is low at 8.5 g per DL showing iron deficiency anemia from chronic blood loss. Advise patient to keep appointment with gynecologist and pursue the option of hysterectomy for menorrhagia.

## 2015-09-08 NOTE — Telephone Encounter (Signed)
Medical Assistant used [employer] to contact patient.  Interpreter Name: Interpreter #: [phone] Medical Assistant left message on patient's home and cell voicemail. Voicemail states to give a call back to [place] with [facility] at [phone].

## 2016-06-03 ENCOUNTER — Encounter: Payer: Self-pay | Admitting: Internal Medicine

## 2016-07-18 ENCOUNTER — Ambulatory Visit: Payer: No Typology Code available for payment source | Attending: Internal Medicine

## 2018-05-20 ENCOUNTER — Encounter (HOSPITAL_COMMUNITY): Payer: Self-pay | Admitting: Emergency Medicine

## 2018-05-20 ENCOUNTER — Emergency Department (HOSPITAL_COMMUNITY)
Admission: EM | Admit: 2018-05-20 | Discharge: 2018-05-20 | Disposition: A | Discharge status: Home / Self Care | Payer: No Typology Code available for payment source | Attending: Emergency Medicine | Admitting: Emergency Medicine

## 2018-05-20 DIAGNOSIS — D649 Anemia, unspecified: Secondary | ICD-10-CM

## 2018-05-20 DIAGNOSIS — R310 Gross hematuria: Secondary | ICD-10-CM | POA: Insufficient documentation

## 2018-05-20 LAB — BASIC METABOLIC PANEL
Anion gap: 10 (ref 5–15)
BUN: 13 mg/dL (ref 6–20)
CALCIUM: 8.9 mg/dL (ref 8.9–10.3)
CHLORIDE: 106 mmol/L (ref 98–111)
CO2: 19 mmol/L — AB (ref 22–32)
CREATININE: 0.43 mg/dL — AB (ref 0.44–1.00)
GFR calc Af Amer: >60 mL/min (ref >60)
GFR calc non Af Amer: >60 mL/min (ref >60)
Glucose, Bld: 93 mg/dL (ref 70–99)
Potassium: 3.9 mmol/L (ref 3.5–5.1)
SODIUM: 135 mmol/L (ref 135–145)

## 2018-05-20 LAB — IRON AND TIBC
Iron: 11 ug/dL — ABNORMAL LOW (ref 28–170)
Saturation Ratios: 3 % — ABNORMAL LOW (ref 10.4–31.8)
TIBC: 332 ug/dL (ref 250–450)
UIBC: 321 ug/dL

## 2018-05-20 LAB — VITAMIN B12: VITAMIN B 12: 278 pg/mL (ref 180–914)

## 2018-05-20 LAB — URINALYSIS, ROUTINE W REFLEX MICROSCOPIC

## 2018-05-20 LAB — RETICULOCYTES
Immature Retic Fract: 29.6 % — ABNORMAL HIGH (ref 2.3–15.9)
RBC.: 4.63 MIL/uL (ref 3.87–5.11)
RETIC CT PCT: 1.9 % (ref 0.4–3.1)
Retic Count, Absolute: 88.9 10*3/uL (ref 19.0–186.0)

## 2018-05-20 LAB — CBC WITH DIFFERENTIAL/PLATELET
ABS IMMATURE GRANULOCYTES: 0.05 10*3/uL (ref 0.00–0.07)
BASOS PCT: 1 %
Basophils Absolute: 0.1 10*3/uL (ref 0.0–0.1)
Eosinophils Absolute: 0.1 10*3/uL (ref 0.0–0.5)
Eosinophils Relative: 1 %
HEMATOCRIT: 32.6 % — AB (ref 36.0–46.0)
Hemoglobin: 8.1 g/dL — ABNORMAL LOW (ref 12.0–15.0)
Immature Granulocytes: 1 %
LYMPHS PCT: 32 %
Lymphs Abs: 3.2 10*3/uL (ref 0.7–4.0)
MCH: 15.8 pg — AB (ref 26.0–34.0)
MCHC: 24.8 g/dL — AB (ref 30.0–36.0)
MCV: 63.4 fL — AB (ref 80.0–100.0)
MONO ABS: 0.8 10*3/uL (ref 0.1–1.0)
MONOS PCT: 8 %
NEUTROS ABS: 5.9 10*3/uL (ref 1.7–7.7)
Neutrophils Relative %: 57 %
Platelets: 561 10*3/uL — ABNORMAL HIGH (ref 150–400)
RBC: 5.14 MIL/uL — ABNORMAL HIGH (ref 3.87–5.11)
RDW: 19.7 % — AB (ref 11.5–15.5)
WBC: 10.3 10*3/uL (ref 4.0–10.5)
nRBC: 0 % (ref 0.0–0.2)

## 2018-05-20 LAB — URINALYSIS, MICROSCOPIC (REFLEX)

## 2018-05-20 LAB — POC URINE PREG, ED: PREG TEST UR: NEGATIVE

## 2018-05-20 LAB — FERRITIN: Ferritin: 3 ng/mL — ABNORMAL LOW (ref 11–307)

## 2018-05-20 LAB — FOLATE: Folate: 13.5 ng/mL (ref >5.9)

## 2018-05-20 NOTE — ED Notes (Signed)
Pt verbalized understanding of discharge paperwork and follow-up care.  °

## 2018-05-20 NOTE — ED Provider Notes (Signed)
[facility] EMERGENCY DEPARTMENT Provider Note   CSN: [id number] Arrival date & time: 05/20/18  0900     History   Chief Complaint Chief Complaint  Patient presents with  . Hematuria    HPI Mariah Lang is a 49 y.o. female with history of menorrhagia, chronic anemia not on iron supplements, uterine myomas is here for evaluation of blood in her urine.  This was sudden onset when she urinated around 10 PM last night.  Describes as the urine being completely bright red.  Initially thought it was the beginning of her menstrual cycle.  But this morning she had continued hematuria with small clots coming out as well.  States her urine stream will stop and she will feel small clots coming out.  She is pretty sure it is coming from the urethra and not vaginal area.  She does not have any bleeding with wiping on the toilet paper.  She wore a pad and there was no blood on the pad.  Noticed blood only in the toilet bowl with urination. There is no associated pain.  She denies associated fevers, chills, recent illnesses, dysuria, urinary frequency or urgency, abdominal or flank pain.  LMP 1/19.  No recent GU/GYN procedures or surgeries.  HPI  Past Medical History:  Diagnosis Date  . Anemia     Patient Active Problem List   Diagnosis Date Noted  . Fibroid (bleeding) (uterine) 12/18/2014  . Menorrhagia, premenopausal 11/24/2014  . Iron deficiency anemia 11/24/2014  . Iron deficiency anemia due to chronic blood loss 09/02/2014  . Pap smear for cervical cancer screening 09/02/2014  . Annual physical exam 09/02/2014  . Dysmenorrhea 02/15/2013  . Anemia due to Dysmenorrhea 02/15/2013    Past Surgical History:  Procedure Laterality Date  . APPENDECTOMY    . CESAREAN SECTION  2000  . OTHER SURGICAL HISTORY  1993   c-section     OB History    Gravida  2   Para  2   Term  2   Preterm  0   AB  0   Living        SAB  0   TAB  0   Ectopic  0   Multiple      Live Births               Home Medications    Prior to Admission medications   Medication Sig Start Date End Date Taking? Authorizing Provider  acetaminophen-codeine (TYLENOL #4) 300-60 MG tablet Take 1 tablet by mouth every 4 (four) hours as needed for moderate pain. 08/27/15   Tresa Garter, MD  clindamycin (CLEOCIN) 150 MG capsule Take 2 capsules (300 mg total) by mouth 3 (three) times daily. 03/21/15   Charlann Lange, PA-C  ferrous sulfate 325 (65 FE) MG tablet Take 1 tablet (325 mg total) by mouth 3 (three) times daily with meals. Patient not taking: Reported on 08/27/2015 11/24/14   Tresa Garter, MD  HYDROcodone-acetaminophen (HYCET) 7.5-325 mg/15 ml solution Take 10 mLs by mouth every 6 (six) hours as needed for moderate pain. 03/21/15   Charlann Lange, PA-C  ibuprofen (ADVIL,MOTRIN) 600 MG tablet Take 1 tablet (600 mg total) by mouth every 6 (six) hours as needed. Patient not taking: Reported on 08/27/2015 12/05/14   Elvera Maria, CNM  medroxyPROGESTERone (PROVERA) 10 MG tablet Take 2 tablets (20 mg total) by mouth daily. 12/18/14   Woodroe Mode, MD  megestrol (MEGACE) 20 MG tablet Take  6 tablets (120 mg) daily for 3 days, 4 tablets daily for 3 days (80 mg), then 1-2 tablets (20-40 mg) daily Patient not taking: Reported on 08/27/2015 12/05/14   Elvera Maria, CNM    Family History No family history on file.  Social History Social History   Tobacco Use  . Smoking status: Never Smoker  Substance Use Topics  . Alcohol use: No  . Drug use: No     Allergies   Patient has no known allergies.   Review of Systems Review of Systems  Genitourinary: Positive for hematuria.  All other systems reviewed and are negative.    Physical Exam Updated Vital Signs BP (!) 124/55 (BP Location: Right Arm)   Pulse 66   Temp 98.2 F (36.8 C) (Oral)   Resp 17   SpO2 100%   Physical Exam Vitals signs and nursing note reviewed.    Constitutional:      Appearance: She is well-developed.     Comments: Non toxic  HENT:     Head: Normocephalic and atraumatic.     Nose: Nose normal.  Eyes:     Conjunctiva/sclera: Conjunctivae normal.     Pupils: Pupils are equal, round, and reactive to light.  Neck:     Musculoskeletal: Normal range of motion.  Cardiovascular:     Rate and Rhythm: Normal rate and regular rhythm.  Pulmonary:     Effort: Pulmonary effort is normal.     Breath sounds: Normal breath sounds.  Abdominal:     General: Bowel sounds are normal.     Palpations: Abdomen is soft.     Tenderness: There is no abdominal tenderness.     Comments: No G/R/R. No suprapubic or CVA tenderness. Negative Murphy's and McBurney's. Active BS to lower quadrants.   Musculoskeletal: Normal range of motion.  Skin:    General: Skin is warm and dry.     Capillary Refill: Capillary refill takes less than 2 seconds.  Neurological:     Mental Status: She is alert and oriented to person, place, and time.  Psychiatric:        Behavior: Behavior normal.      ED Treatments / Results  Labs (all labs ordered are listed, but only abnormal results are displayed) Labs Reviewed  URINALYSIS, ROUTINE W REFLEX MICROSCOPIC - Abnormal; Notable for the following components:      Result Value   Color, Urine RED (*)    APPearance TURBID (*)    Glucose, UA   (*)    Value: TEST NOT REPORTED DUE TO COLOR INTERFERENCE OF URINE PIGMENT   Hgb urine dipstick   (*)    Value: TEST NOT REPORTED DUE TO COLOR INTERFERENCE OF URINE PIGMENT   Bilirubin Urine   (*)    Value: TEST NOT REPORTED DUE TO COLOR INTERFERENCE OF URINE PIGMENT   Ketones, ur   (*)    Value: TEST NOT REPORTED DUE TO COLOR INTERFERENCE OF URINE PIGMENT   Protein, ur   (*)    Value: TEST NOT REPORTED DUE TO COLOR INTERFERENCE OF URINE PIGMENT   Nitrite   (*)    Value: TEST NOT REPORTED DUE TO COLOR INTERFERENCE OF URINE PIGMENT   Leukocytes,Ua   (*)    Value: TEST NOT  REPORTED DUE TO COLOR INTERFERENCE OF URINE PIGMENT   All other components within normal limits  CBC WITH DIFFERENTIAL/PLATELET - Abnormal; Notable for the following components:   RBC 5.14 (*)    Hemoglobin 8.1 (*)  HCT 32.6 (*)    MCV 63.4 (*)    MCH 15.8 (*)    MCHC 24.8 (*)    RDW 19.7 (*)    Platelets 561 (*)    All other components within normal limits  BASIC METABOLIC PANEL - Abnormal; Notable for the following components:   CO2 19 (*)    Creatinine, Ser 0.43 (*)    All other components within normal limits  URINALYSIS, MICROSCOPIC (REFLEX) - Abnormal; Notable for the following components:   Bacteria, UA FEW (*)    All other components within normal limits  URINE CULTURE  VITAMIN B12  FOLATE  IRON AND TIBC  FERRITIN  RETICULOCYTES  POC URINE PREG, ED    EKG None  Radiology No results found.  Procedures Procedures (including critical care time)  Medications Ordered in ED Medications - No data to display   Initial Impression / Assessment and Plan / ED Course  I have reviewed the triage vital signs and the nursing notes.  Pertinent labs & imaging results that were available during my care of the patient were reviewed by me and considered in my medical decision making (see chart for details).  Clinical Course as of May 20 1338  Sun May 20, 2018  1100 Bacteria, UA(!): FEW [CG]  1101 WBC, UA: 6-10 [CG]  1335 8.5 two years ago; pt is not taking iron supplements anymore. H/o menorrhagia, LMP 1/19, anticipate MP in 2-3 days   Hemoglobin(!): 8.1 [CG]    Clinical Course User Index [CG] Kinnie Feil, PA-C   48 yo with painless gross hematuria.  No concerning symptoms of infection such as dysuria, urinary frequency/urgency, flank pain.  No recent trauma, new medications.  She has no h/o renal disease. Ddx cystitis vs structural bleeding from lower GU system given clots vs mass/polyp.  Given age, could be malignancy.  No infectious symptoms to suggest  UTI/pyelo.  Less likely renal stone, glomerular bleeding.  Will check creatinine, hemoglobin, UA.   1335: UA with WBCs and bacteria but she has no symptoms, will send for culture and defer abx right now.  Creatinine WNL. Hgb 8.1 from 8.5 two years ago, this is in setting of chronic anemia not compliant with iron, menorrhagia.  She bleeds heavily for 3-4 days (6 large pads/day) with LMP 1/19. I notified pt of low hgb that is chronic, she denies symptoms of anemia.  I discussed with Dr Case (urology) who does not think there will be significant drop in hgb over the next 2-3 days.  Pt does anticipate menstrual cycle in the next 3 days so this could contribute.  Pt considered safe for discharge with iron supplements, close f/u with PCP for repeat H/H and urology work up for possible imaging.  Gave pt strict return precautions, she is aware of symptoms that would warrant immediate return to ER.   Final Clinical Impressions(s) / ED Diagnoses   Final diagnoses:  Gross hematuria  Chronic anemia    ED Discharge Orders    None       Kinnie Feil, PA-C 05/20/18 1340    Gareth Morgan, MD 05/24/18 1047

## 2018-05-20 NOTE — ED Triage Notes (Signed)
Pt arrives with reports of blood in her urine since last night. Denies any pain. Reports coming today due to blood clots in urine.

## 2018-05-20 NOTE — Discharge Instructions (Addendum)
You need to get your blood checked again in 2-3 days (hemoglobin). We have sent an anemia panel.  Restart taking your iron supplements.   Call your primary care doctor to make an appointment, you may need referral to urologist if blood in urine continues  Return for heavier bleeding, inability to urinate, abdominal or flank pain, light-headedness, chest pain, shortness of breath, light-headedness

## 2018-05-21 ENCOUNTER — Encounter: Payer: Self-pay | Admitting: Nurse Practitioner

## 2018-05-21 ENCOUNTER — Ambulatory Visit: Payer: Self-pay | Attending: Nurse Practitioner | Admitting: Nurse Practitioner

## 2018-05-21 VITALS — BP 96/59 | HR 101 | Temp 99.2°F | Ht 60.0 in | Wt 186.6 lb

## 2018-05-21 DIAGNOSIS — R310 Gross hematuria: Secondary | ICD-10-CM

## 2018-05-21 DIAGNOSIS — R7303 Prediabetes: Secondary | ICD-10-CM

## 2018-05-21 DIAGNOSIS — Z8742 Personal history of other diseases of the female genital tract: Secondary | ICD-10-CM

## 2018-05-21 LAB — POCT GLYCOSYLATED HEMOGLOBIN (HGB A1C): Hemoglobin A1C: 5.5 % (ref 4.0–5.6)

## 2018-05-21 LAB — GLUCOSE, POCT (MANUAL RESULT ENTRY): POC Glucose: 134 mg/dL — AB (ref 70–99)

## 2018-05-21 LAB — URINE CULTURE: Culture: NO GROWTH

## 2018-05-21 NOTE — Progress Notes (Signed)
Assessment & Plan:  Mariah Lang was seen today for hospitalization follow-up.  Diagnoses and all orders for this visit:  Prediabetes -     Glucose (CBG) -     HgB A1c Continue blood sugar control as discussed in office today, low carbohydrate diet, and regular physical exercise as tolerated, 150 minutes per week (30 min each day, 5 days per week, or 50 min 3 days per week).  Annual eye exams and foot exams are recommended.  History of menorrhagia -     Ambulatory referral to Gynecology  Gross hematuria -     Ambulatory referral to Urology    Patient has been counseled on age-appropriate routine health concerns for screening and prevention. These are reviewed and up-to-date. Referrals have been placed accordingly. Immunizations are up-to-date or declined.    Subjective:   Chief Complaint  Patient presents with  . Hospitalization Follow-up    Pt. stated she is here for vaginal bleeding. Pt. stated everytime she urintates there is blood and clots.    HPI Mariah Lang 48 y.o. female presents to office today to establish care and for hospital follow up. She has a history of prediabetes, menorrhagia, uterine fibroids, and chronic anemia. She has not been seen by a PCP in almost 3 years.  She was seen in the ED yesterday for hematuria and small blood clots noted when wiping with toilet paper. Urinalysis was positive for hematuria and at that time patient stated she was nearing the date of her menstrual cycle in 3 days. Urine culture was negative. Urology was consulted and patient was instructed to follow up with PCP and Urology as OP with repeat CBC. PER ED PROVIDER: Ddx cystitis vs structural bleeding from lower GU system given clots vs mass/polyp.  Given age, could be malignancy.  No infectious symptoms to suggest UTI/pyelo.  Less likely renal stone, glomerular bleeding.   Menorrhagia She was evaluated by GYN for menorrhagia (onset 2011) as she was still bleeding while taking  megace. US showing enlarging fibroids in 2016. She was started on Provera 20 mg daily and given Lupron injection at her GYN office visit 3 years ago. She was instructed to apply for the financial assistance as Dr. Roselie Awkward at that time considered her a candidate for hysterectomy.  Ultrasound Markedly enlarged myomatous uterus, with interval growth of the uterine fibroids, including a dominant 9.1 cm posterior fundal fibroid and a 3.9 cm left anterior uterine body fibroid that likely contains a small submucosal component. 2. Fluid in the endometrial cavity. Nondiagnostic evaluation of the endometrium due to distortion by the surrounding fibroids. 3. Bilateral mild-to-moderate hydrosalpinges. No suspicious ovarian or adnexal masses  Prediabetes Chronic and diet controlled at this time. She denies any hypo or hyperglycemic symptoms. Overdue for eye exam. Patient has been advised to apply for financial assistance and schedule to see our financial counselor.  Lab Results  Component Value Date   HGBA1C 5.5 05/21/2018     Review of Systems  Constitutional: Negative for fever, malaise/fatigue and weight loss.  HENT: Negative.  Negative for nosebleeds.   Eyes: Negative.  Negative for blurred vision, double vision and photophobia.  Respiratory: Negative.  Negative for cough and shortness of breath.   Cardiovascular: Negative.  Negative for chest pain, palpitations and leg swelling.  Gastrointestinal: Negative.  Negative for abdominal pain, blood in stool, constipation, diarrhea, heartburn, melena, nausea and vomiting.  Genitourinary: Positive for hematuria. Negative for dysuria, flank pain, frequency and urgency.  SEE HPI; she denies any GU symptoms such as dysuria, flank pain, urgency, frequency or hesitancy  Musculoskeletal: Negative.  Negative for myalgias.  Neurological: Negative.  Negative for dizziness, focal weakness, seizures and headaches.  Psychiatric/Behavioral: Negative.  Negative  for suicidal ideas.    Past Medical History:  Diagnosis Date  . Anemia     Past Surgical History:  Procedure Laterality Date  . APPENDECTOMY    . CESAREAN SECTION  2000  . OTHER SURGICAL HISTORY  1993   c-section    History reviewed. No pertinent family history.  Social History Reviewed with no changes to be made today.   Outpatient Medications Prior to Visit  Medication Sig Dispense Refill  . acetaminophen-codeine (TYLENOL #4) 300-60 MG tablet Take 1 tablet by mouth every 4 (four) hours as needed for moderate pain. 30 tablet 0  . clindamycin (CLEOCIN) 150 MG capsule Take 2 capsules (300 mg total) by mouth 3 (three) times daily. 60 capsule 0  . ferrous sulfate 325 (65 FE) MG tablet Take 1 tablet (325 mg total) by mouth 3 (three) times daily with meals. (Patient not taking: Reported on 08/27/2015) 270 tablet 11  . HYDROcodone-acetaminophen (HYCET) 7.5-325 mg/15 ml solution Take 10 mLs by mouth every 6 (six) hours as needed for moderate pain. 70 mL 0  . ibuprofen (ADVIL,MOTRIN) 600 MG tablet Take 1 tablet (600 mg total) by mouth every 6 (six) hours as needed. (Patient not taking: Reported on 08/27/2015) 30 tablet 2  . megestrol (MEGACE) 20 MG tablet Take 6 tablets (120 mg) daily for 3 days, 4 tablets daily for 3 days (80 mg), then 1-2 tablets (20-40 mg) daily (Patient not taking: Reported on 08/27/2015) 40 tablet 1  . medroxyPROGESTERone (PROVERA) 10 MG tablet Take 2 tablets (20 mg total) by mouth daily. 30 tablet 2  . leuprolide (LUPRON) injection 11.25 mg      No facility-administered medications prior to visit.     No Known Allergies     Objective:    BP (!) 96/59 (BP Location: Left Arm, Patient Position: Sitting, Cuff Size: Normal)   Pulse (!) 101   Temp 99.2 F (37.3 C) (Oral)   Ht 5' (1.524 m)   Wt 186 lb 9.6 oz (84.6 kg)   LMP 04/22/2018   SpO2 98%   BMI 36.44 kg/m  Wt Readings from Last 3 Encounters:  05/21/18 186 lb 9.6 oz (84.6 kg)  08/27/15 194 lb 6.4 oz (88.2  kg)  03/20/15 188 lb (85.3 kg)    Physical Exam Vitals signs and nursing note reviewed.  Constitutional:      Appearance: She is well-developed.  HENT:     Head: Normocephalic and atraumatic.  Neck:     Musculoskeletal: Normal range of motion.  Cardiovascular:     Rate and Rhythm: Regular rhythm. Tachycardia present.     Heart sounds: Normal heart sounds. No murmur. No friction rub. No gallop.   Pulmonary:     Effort: Pulmonary effort is normal. No tachypnea or respiratory distress.     Breath sounds: Normal breath sounds. No decreased breath sounds, wheezing, rhonchi or rales.  Chest:     Chest wall: No tenderness.  Abdominal:     General: Bowel sounds are normal.     Palpations: Abdomen is soft.     Tenderness: There is no abdominal tenderness.  Genitourinary:    Comments: Attempted PAP smear. Very difficult to manipulate speculum into vagina due to size of introitus and unable to manually assess  cervix. She denies any GU surgeries.  Musculoskeletal: Normal range of motion.  Skin:    General: Skin is warm and dry.  Neurological:     Mental Status: She is alert and oriented to person, place, and time.     Coordination: Coordination normal.  Psychiatric:        Behavior: Behavior normal. Behavior is cooperative.        Thought Content: Thought content normal.        Judgment: Judgment normal.          Patient has been counseled extensively about nutrition and exercise as well as the importance of adherence with medications and regular follow-up. The patient was given clear instructions to go to ER or return to medical center if symptoms don't improve, worsen or new problems develop. The patient verbalized understanding.   Follow-up: Return for Needs appointment with financial representative.Gildardo Pounds, FNP-BC [facility] and [facility] [place], [place]   05/23/2018, 10:16 PM

## 2018-05-23 ENCOUNTER — Encounter: Payer: Self-pay | Admitting: Nurse Practitioner

## 2018-05-24 ENCOUNTER — Ambulatory Visit: Payer: No Typology Code available for payment source

## 2018-05-30 ENCOUNTER — Inpatient Hospital Stay: Payer: No Typology Code available for payment source

## 2018-06-11 ENCOUNTER — Ambulatory Visit: Payer: Self-pay | Attending: Family Medicine

## 2018-06-11 DIAGNOSIS — R7303 Prediabetes: Secondary | ICD-10-CM

## 2018-06-11 DIAGNOSIS — Z0000 Encounter for general adult medical examination without abnormal findings: Secondary | ICD-10-CM

## 2018-06-11 DIAGNOSIS — Z13228 Encounter for screening for other metabolic disorders: Secondary | ICD-10-CM

## 2018-06-12 ENCOUNTER — Telehealth: Payer: Self-pay | Admitting: Internal Medicine

## 2018-06-12 ENCOUNTER — Ambulatory Visit: Payer: No Typology Code available for payment source | Admitting: Obstetrics & Gynecology

## 2018-06-12 ENCOUNTER — Encounter: Payer: Self-pay | Admitting: Obstetrics & Gynecology

## 2018-06-12 ENCOUNTER — Ambulatory Visit: Payer: Self-pay | Admitting: Obstetrics & Gynecology

## 2018-06-12 VITALS — BP 125/78 | HR 64 | Wt 179.0 lb

## 2018-06-12 DIAGNOSIS — Z0000 Encounter for general adult medical examination without abnormal findings: Secondary | ICD-10-CM

## 2018-06-12 LAB — CBC
Hematocrit: 27.6 % — ABNORMAL LOW (ref 34.0–46.6)
Hemoglobin: 7 g/dL — CL (ref 11.1–15.9)
MCH: 16.4 pg — AB (ref 26.6–33.0)
MCHC: 25.4 g/dL — ABNORMAL LOW (ref 31.5–35.7)
MCV: 65 fL — ABNORMAL LOW (ref 79–97)
Platelets: 602 10*3/uL — ABNORMAL HIGH (ref 150–450)
RBC: 4.26 x10E6/uL (ref 3.77–5.28)
RDW: 18.2 % — ABNORMAL HIGH (ref 11.7–15.4)
WBC: 6.9 10*3/uL (ref 3.4–10.8)

## 2018-06-12 LAB — CMP14+EGFR
ALT: 9 IU/L (ref 0–32)
AST: 21 IU/L (ref 0–40)
Albumin/Globulin Ratio: 1.4 (ref 1.2–2.2)
Albumin: 4.1 g/dL (ref 3.8–4.8)
Alkaline Phosphatase: 45 IU/L (ref 39–117)
BUN/Creatinine Ratio: 29 — ABNORMAL HIGH (ref 9–23)
BUN: 14 mg/dL (ref 6–24)
Bilirubin Total: 0.2 mg/dL (ref 0.0–1.2)
CO2: 22 mmol/L (ref 20–29)
CREATININE: 0.48 mg/dL — AB (ref 0.57–1.00)
Calcium: 9.1 mg/dL (ref 8.7–10.2)
Chloride: 103 mmol/L (ref 96–106)
GFR calc Af Amer: 136 mL/min/{1.73_m2} (ref >59)
GFR calc non Af Amer: 118 mL/min/{1.73_m2} (ref >59)
Globulin, Total: 2.9 g/dL (ref 1.5–4.5)
Glucose: 85 mg/dL (ref 65–99)
Potassium: 4.4 mmol/L (ref 3.5–5.2)
Sodium: 139 mmol/L (ref 134–144)
Total Protein: 7 g/dL (ref 6.0–8.5)

## 2018-06-12 LAB — LIPID PANEL
Chol/HDL Ratio: 2.6 ratio (ref 0.0–4.4)
Cholesterol, Total: 128 mg/dL (ref 100–199)
HDL: 50 mg/dL (ref >39)
LDL Calculated: 68 mg/dL (ref 0–99)
TRIGLYCERIDES: 48 mg/dL (ref 0–149)
VLDL Cholesterol Cal: 10 mg/dL (ref 5–40)

## 2018-06-12 NOTE — Progress Notes (Signed)
    Patient is Spanish-speaking only, Spanish interpreter present for this encounter.  Patient with no gynecologic complaints currently, unsure of reason for being here. No pain, no abnormal bleeding, no issues with her fibroids.  She is self-pay, does not want to be charged if nothing is being done. Wants pap smear and breast cancer screening, she will be referred to [facility] for this.   Verita Schneiders, MD

## 2018-06-12 NOTE — Patient Instructions (Signed)
Regrese a la clinica cuando tenga su cita. Si tiene problemas o preguntas, llama a la clinica o vaya a la sala de emergencia al [facility].    

## 2018-06-12 NOTE — Telephone Encounter (Signed)
The patient dropped off the information for the financial application process. Copies were made. Explained the process to the patient with an interrupter present and also informed the wait time is generally 6-8 weeks for an response.

## 2018-06-15 ENCOUNTER — Other Ambulatory Visit: Payer: Self-pay | Admitting: Nurse Practitioner

## 2018-06-15 ENCOUNTER — Telehealth: Payer: Self-pay

## 2018-06-15 DIAGNOSIS — D219 Benign neoplasm of connective and other soft tissue, unspecified: Secondary | ICD-10-CM

## 2018-06-15 DIAGNOSIS — D500 Iron deficiency anemia secondary to blood loss (chronic): Secondary | ICD-10-CM

## 2018-06-15 DIAGNOSIS — N921 Excessive and frequent menstruation with irregular cycle: Secondary | ICD-10-CM

## 2018-06-15 MED ORDER — FERROUS SULFATE 325 (65 FE) MG PO TABS: 325.0000 mg | ORAL_TABLET | Freq: Three times a day (TID) | ORAL | 11 refills | Status: DC

## 2018-06-15 NOTE — Telephone Encounter (Signed)
-----   Message from Gildardo Pounds, NP sent at 06/15/2018  7:01 AM EDT ----- Hgb is very low. Make sure you are taking you iron 3 times per day. I have sent this medication to the pharmacy. You  Were referred to gynecology for excessive bleeding, anemia and fibroids on your uterus. I have referred you back to them. These were things we discussed when you were in the office. Please make sure these things are discussed at your next appointment. It is very important that you are evaluated.

## 2018-06-15 NOTE — Telephone Encounter (Signed)
CMA attempt to reach patient to inform on lab results.  No answer and left a VM for a call back.  

## 2018-06-18 ENCOUNTER — Telehealth: Payer: Self-pay | Admitting: General Practice

## 2018-06-18 NOTE — Telephone Encounter (Signed)
CMA spoke to patient to inform on results.  Pt. Understood.  Pt. Verified DOB.  Spanish interpreter assist with the call.

## 2018-06-18 NOTE — Telephone Encounter (Signed)
A letter will be send out to reach patient

## 2018-06-18 NOTE — Telephone Encounter (Signed)
Pt called back in regards to results Please follow up

## 2018-06-27 ENCOUNTER — Other Ambulatory Visit: Payer: Self-pay | Admitting: Nurse Practitioner

## 2018-06-27 ENCOUNTER — Ambulatory Visit (HOSPITAL_COMMUNITY)
Admission: RE | Admit: 2018-06-27 | Discharge: 2018-06-27 | Disposition: A | Discharge status: Home / Self Care | Payer: Self-pay | Source: Ambulatory Visit | Attending: Nurse Practitioner | Admitting: Nurse Practitioner

## 2018-06-27 ENCOUNTER — Other Ambulatory Visit: Payer: Self-pay

## 2018-06-27 DIAGNOSIS — D219 Benign neoplasm of connective and other soft tissue, unspecified: Secondary | ICD-10-CM | POA: Insufficient documentation

## 2018-06-27 DIAGNOSIS — N921 Excessive and frequent menstruation with irregular cycle: Secondary | ICD-10-CM | POA: Insufficient documentation

## 2018-06-27 DIAGNOSIS — D500 Iron deficiency anemia secondary to blood loss (chronic): Secondary | ICD-10-CM | POA: Insufficient documentation

## 2018-06-27 IMAGING — US US PELVIS COMPLETE WITH TRANSVAGINAL
1 series · 13 of 25 positions shown · non-contrast
Comparison: [DATE]

CLINICAL DATA: Menorrhagia, evaluate fibroids, iron deficiency
anemia due to chronic blood loss



[Series 1: us pelvis complete with transvaginal · 106 acquisitions, 13 frames shown]
[im 1/106]
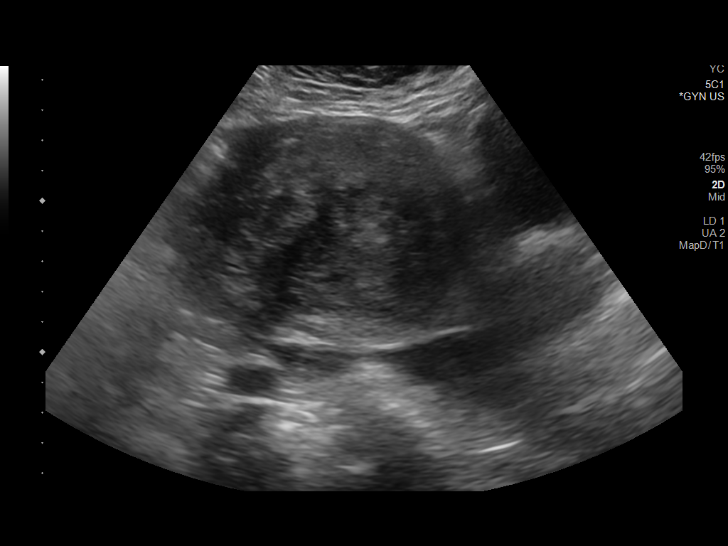
[im 9/106]
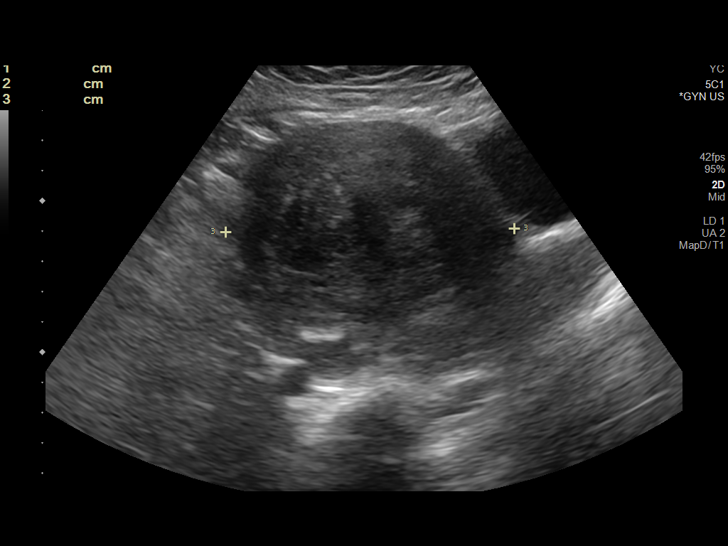
[im 18/106]
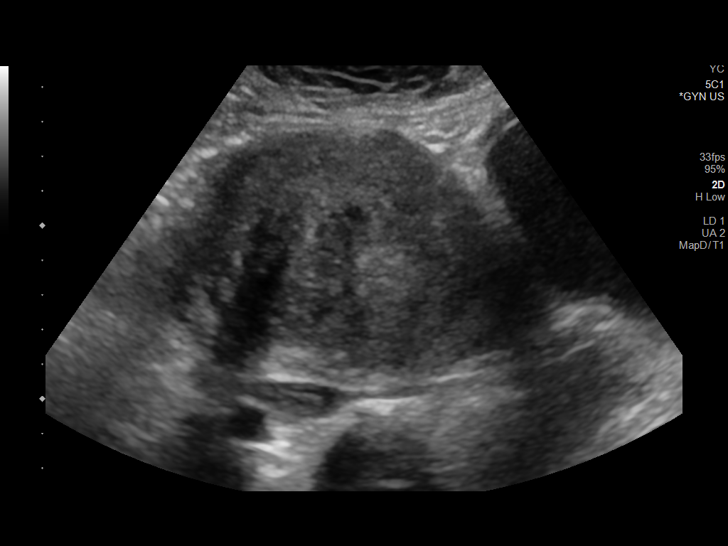
[im 27/106]
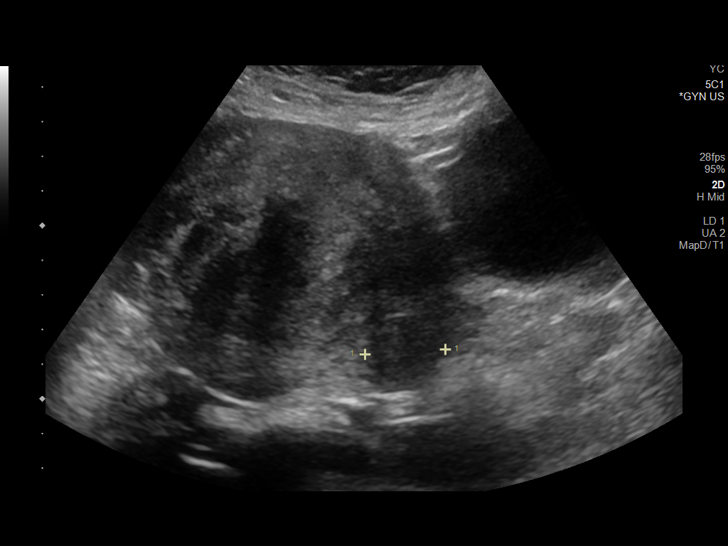
[im 36/106]
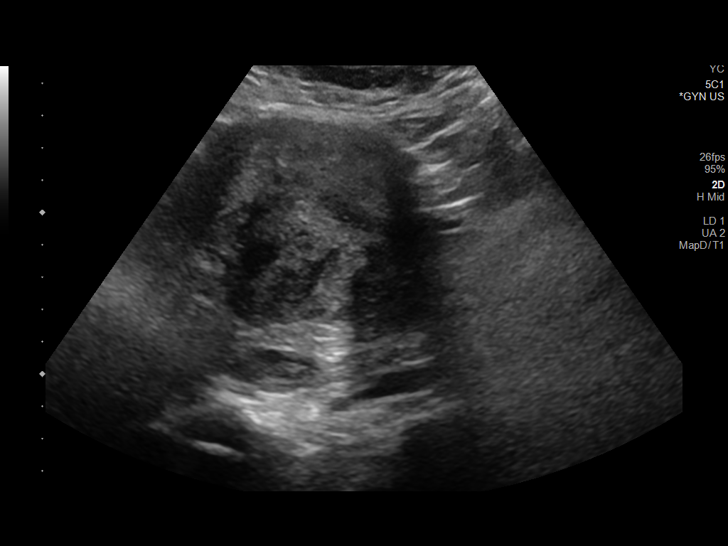
[im 44/106]
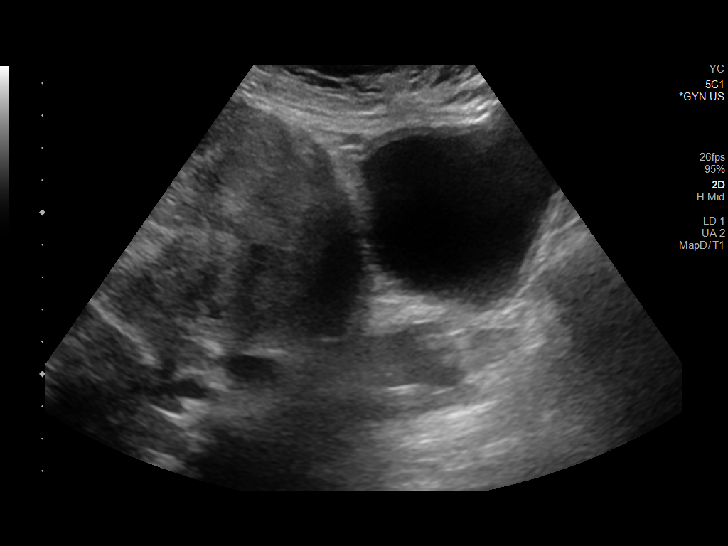
[im 53/106]
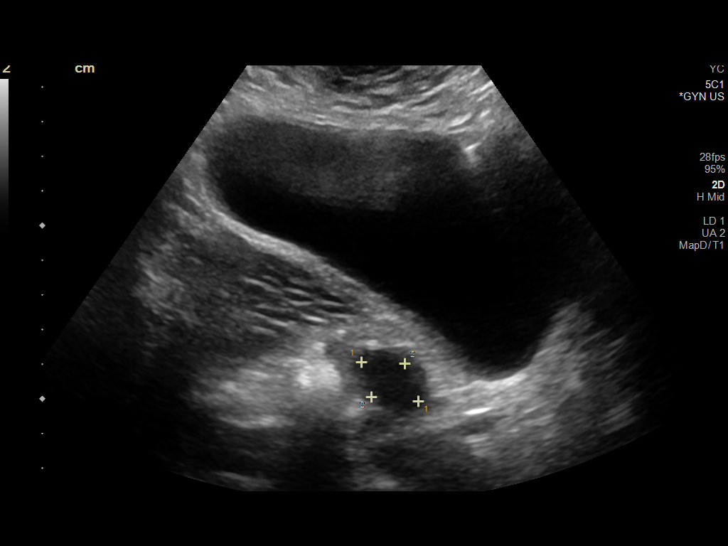
[im 62/106]
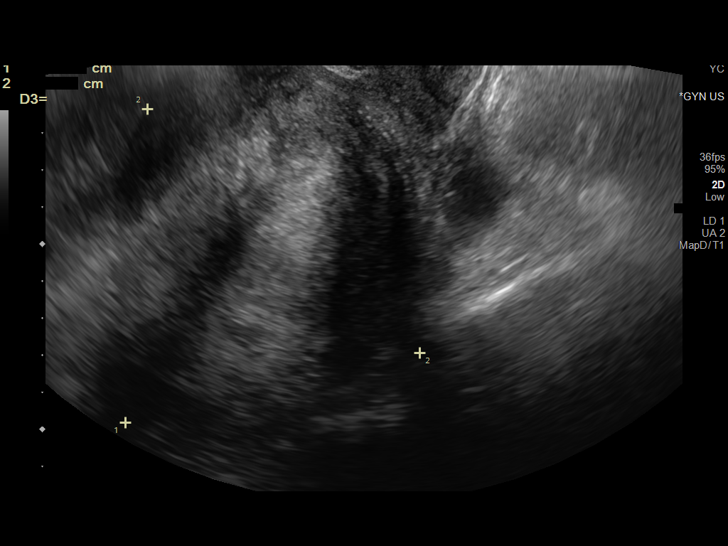
[im 71/106]
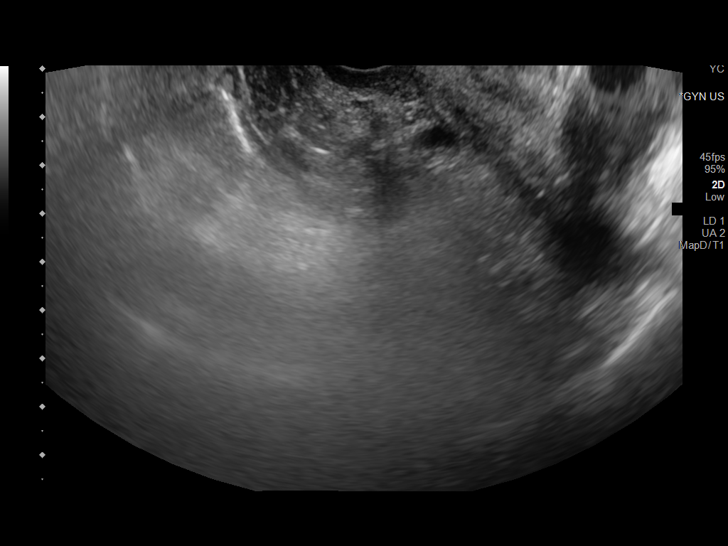
[im 79/106]
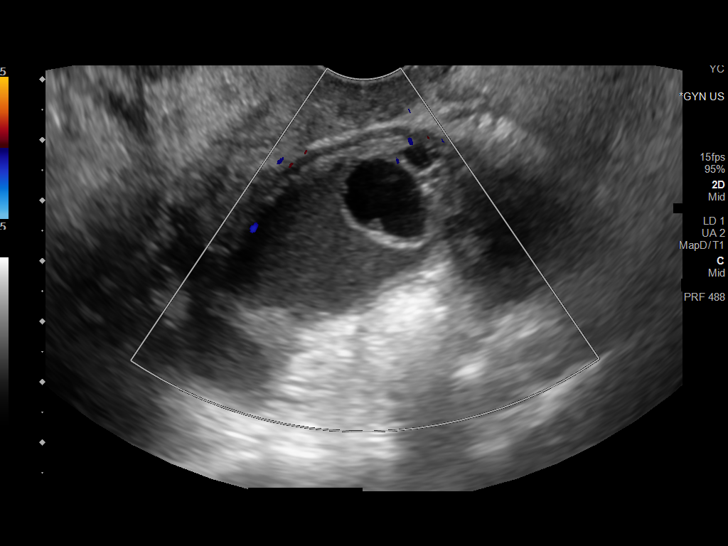
[im 88/106]
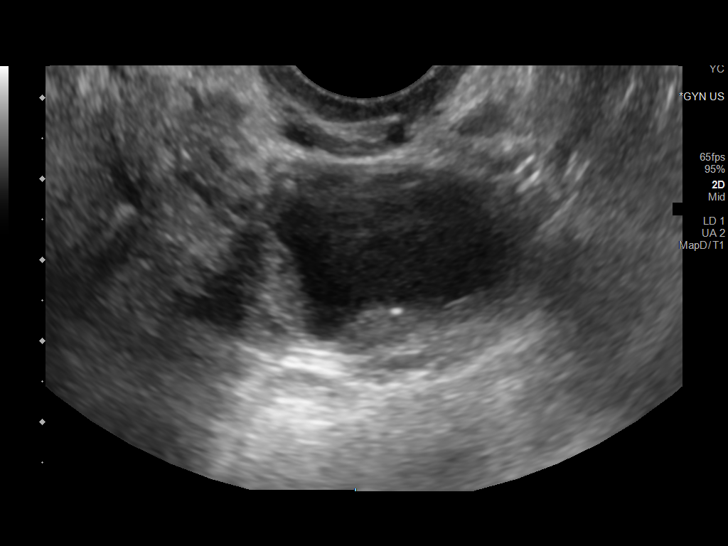
[im 97/106]
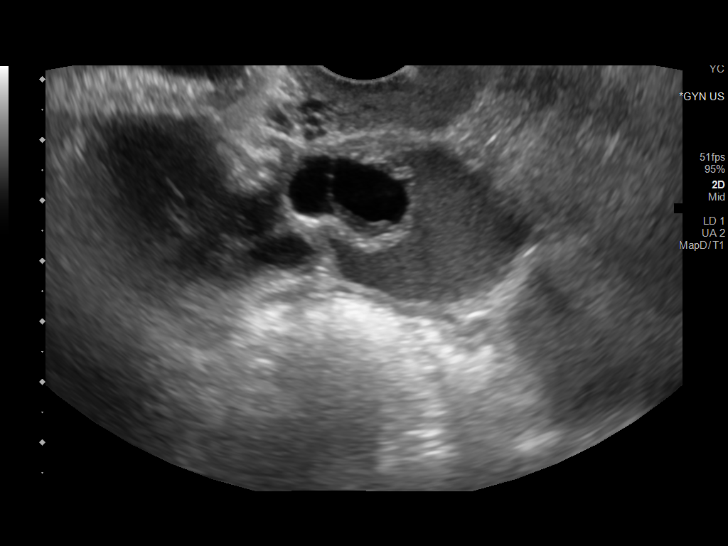
[im 106/106]
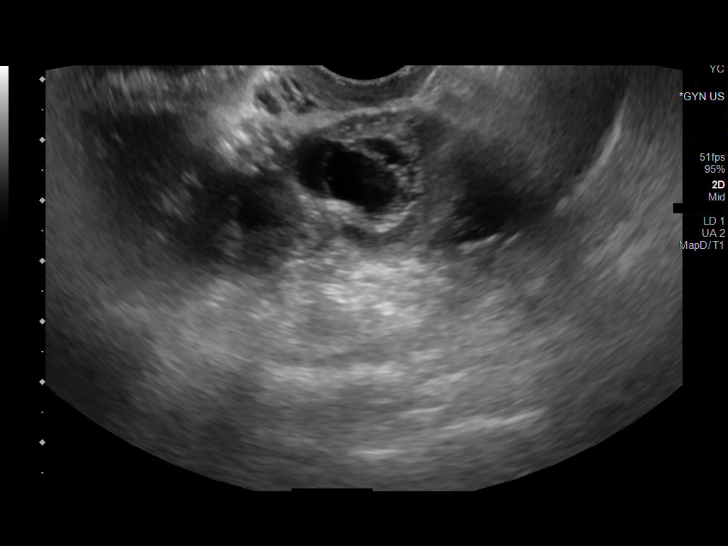

[13 of 25 positions shown; findings below may reference images not displayed]

FINDINGS: Uterus

Measurements: 13.9 x 8.4 x 9.5 cm = volume: 580 mL. Enlarged nodular
uterus can containing multiple leiomyomata. Large fundal mass,
submucosal, 6.6 x 5.9 x 7.5 cm. Small posterior mid uterine
subserosal nodule 2.4 x 2.2 x 2.3 cm.

Endometrium

Thickness: Not delineated on either transabdominal or endovaginal
imaging, suspect obscured by leiomyomata.

Right ovary

Measurements: 2.8 x 1.3 x 2.5 cm = volume: 5.0 mL. Normal morphology
without mass

Left ovary

Measurements: 3.6 x 2.4 x 3.2 cm = volume: 14.3 mL. Complex cyst
x 1.7 x 2.6 cm containing scattered low level internal
echogenicity.

Other findings

No free pelvic fluid. Additional complex hypoechoic nodule within
LEFT adnexa, containing small simple appearing cystic focus with
remainder a complex cystic component containing diffuse low level
internal echogenicity, question hemorrhagic cyst versus
endometrioma, lesion overall 4.3 x 2.9 x 4.3 cm.
IMPRESSION: Enlarged uterus containing multiple leiomyomata, largest a 7.5 cm
diameter submucosal leiomyoma at the fundus.

Nonvisualization of endometrial complex likely obscured by uterine
masses.

Small mildly complicated LEFT ovarian cyst 2.7 cm diameter with a
4.3 x 2.9 x 4.3 cm diameter complex 60 max adjacent to LEFT ovary
with a large locule containing low level homogeneous internal
echogenicity question endometrioma versus hemorrhagic cyst.

## 2018-06-29 ENCOUNTER — Telehealth: Payer: Self-pay | Admitting: General Practice

## 2018-06-29 ENCOUNTER — Telehealth: Payer: Self-pay

## 2018-06-29 NOTE — Telephone Encounter (Signed)
-----   Message from Gildardo Pounds, NP sent at 06/27/2018  5:58 PM EDT ----- You have several fibroids and cysts in the pelvic cavity. Will need to be referred to Gynecology. Please make sure your orange card and cone financial assistance are up to date. They will call you to schedule

## 2018-06-29 NOTE — Telephone Encounter (Signed)
CMA spoke to patient to inform on results of ultrasound.  Pt. Verified DOB. Pt. Understood.  Pt is aware of referral and just applied for CAFA waiting for approval.  Spanish interpreter assist with the call.

## 2018-06-29 NOTE — Telephone Encounter (Signed)
Patient called back to get their results please follow up

## 2018-06-29 NOTE — Telephone Encounter (Signed)
CMA attempt to reach patient to inform on results.  No answer and unable to leave VM due to loud background music.

## 2018-06-29 NOTE — Telephone Encounter (Signed)
CMA spoke to patient to inform on results of ultrasound.  Pt. Verified DOB. Pt. Understood.  Spanish interpreter assist with the call.

## 2018-09-24 ENCOUNTER — Other Ambulatory Visit: Payer: Self-pay

## 2018-09-24 ENCOUNTER — Ambulatory Visit (INDEPENDENT_AMBULATORY_CARE_PROVIDER_SITE_OTHER): Payer: Self-pay | Admitting: Obstetrics and Gynecology

## 2018-09-24 ENCOUNTER — Encounter: Payer: Self-pay | Admitting: Obstetrics and Gynecology

## 2018-09-24 VITALS — BP 134/80 | HR 80 | Temp 98.1°F | Wt 190.7 lb

## 2018-09-24 DIAGNOSIS — N946 Dysmenorrhea, unspecified: Secondary | ICD-10-CM

## 2018-09-24 DIAGNOSIS — N920 Excessive and frequent menstruation with regular cycle: Secondary | ICD-10-CM

## 2018-09-24 DIAGNOSIS — D259 Leiomyoma of uterus, unspecified: Secondary | ICD-10-CM

## 2018-09-24 LAB — POCT PREGNANCY, URINE: Preg Test, Ur: NEGATIVE

## 2018-09-24 MED ORDER — MEDROXYPROGESTERONE ACETATE 150 MG/ML IM SUSP
150.0000 mg | Freq: Once | INTRAMUSCULAR | Status: AC
  Administered 2018-09-24: 150 mg via INTRAMUSCULAR

## 2018-09-24 MED ORDER — TRANEXAMIC ACID 650 MG PO TABS: 1300.0000 mg | ORAL_TABLET | Freq: Three times a day (TID) | ORAL | 1 refills | Status: DC

## 2018-09-24 MED ORDER — TRANEXAMIC ACID 650 MG PO TABS: 1300.0000 mg | ORAL_TABLET | Freq: Three times a day (TID) | ORAL | 1 refills | Status: AC

## 2018-09-24 NOTE — Progress Notes (Signed)
Obstetrics and Gynecology New Patient Evaluation  Appointment Date: 09/24/2018  OBGYN Clinic: Center for [facility]  Primary Care Provider: Patient, No Pcp Per  Referring Provider: Gildardo Pounds, NP  Chief Complaint: fibroid uterus  History of Present Illness: Rasheeda Mulvehill is a 48 y.o. Hispanic [phone] (Patient's last menstrual period was 09/03/2018 (exact date).), seen for the above chief complaint. Her past medical history is significant for BMI 38, h/o c-section x 2, fibroids, anemia.  Patient with long history of menorrhagia and dysmenorrhea and had an u/s March 2020 by her PCP that identified fibroids and pt set up for visit with [place]. She was seen by [place] in 2016 for bleeding with fibroids with plan for financial assistance and a hysterectomy. She was seen by [place] in march 2020, before her u/s but pt unsure why she was referred.  It looks like she had one dose of depo provera back in 2016 approximatley 4 months prior to that GYN visit and was ? Changed to PO pills.   Currently no pain, VB, discharge, itching, dysuria. She denies any hot flashes, night sweats. Pt is not sexually active  Review of Systems: as noted in the History of Present Illness.  Patient Active Problem List   Diagnosis Date Noted  . Fibroid uterus 12/18/2014  . Menorrhagia, premenopausal 11/24/2014  . Iron deficiency anemia 11/24/2014  . Iron deficiency anemia due to chronic blood loss 09/02/2014  . Pap smear for cervical cancer screening 09/02/2014  . Annual physical exam 09/02/2014  . Dysmenorrhea 02/15/2013  . Anemia due to Dysmenorrhea 02/15/2013    Past Medical History:  Past Medical History:  Diagnosis Date  . Anemia     Past Surgical History:  Past Surgical History:  Procedure Laterality Date  . APPENDECTOMY    . CESAREAN SECTION  2000  . OTHER SURGICAL HISTORY  1993   c-section    Past Obstetrical History:  OB History  Gravida Para Term Preterm AB Living  2 2 2   0 0 2  SAB TAB Ectopic Multiple Live Births  0 0 0   2    # Outcome Date GA Lbr Len/2nd Weight Sex Delivery Anes PTL Lv  2 Term      CS-Unspec     1 Term      CS-Unspec       Past Gynecological History: As per HPI. Periods: qmonth, regular, one week, heavy, painful History of Pap Smear(s): Yes.   Last pap 08/2014, which was NILM She is currently using abstinence for contraception.   Social History:  Social History   Socioeconomic History  . Marital status: Single    Spouse name: Not on file  . Number of children: Not on file  . Years of education: Not on file  . Highest education level: Not on file  Occupational History  . Not on file  Social Needs  . Financial resource strain: Not on file  . Food insecurity    Worry: Not on file    Inability: Not on file  . Transportation needs    Medical: Not on file    Non-medical: Not on file  Tobacco Use  . Smoking status: Never Smoker  . Smokeless tobacco: Never Used  Substance and Sexual Activity  . Alcohol use: No  . Drug use: No  . Sexual activity: Yes    Birth control/protection: None  Lifestyle  . Physical activity    Days per week: Not on file    Minutes per  session: Not on file  . Stress: Not on file  Relationships  . Social Herbalist on phone: Not on file    Gets together: Not on file    Attends religious service: Not on file    Active member of club or organization: Not on file    Attends meetings of clubs or organizations: Not on file    Relationship status: Not on file  . Intimate partner violence    Fear of current or ex partner: Not on file    Emotionally abused: Not on file    Physically abused: Not on file    Forced sexual activity: Not on file  Other Topics Concern  . Not on file  Social History Narrative   ** Merged History Encounter **        Family History: No family history on file.  Health Maintenance:  Mammogram(s): Yes.   Date: 2014  Medications Phaedra Colgate had  no medications administered during this visit. Current Outpatient Medications  Medication Sig Dispense Refill  . ferrous sulfate 325 (65 FE) MG tablet Take 1 tablet (325 mg total) by mouth 3 (three) times daily with meals. 270 tablet 11   No current facility-administered medications for this visit.     Allergies Patient has no known allergies.   Physical Exam:  BP 134/80 (BP Location: Right Arm)   Pulse 80   Temp 98.1 F (36.7 C)   Wt 190 lb 11.2 oz (86.5 kg)   LMP 09/03/2018 (Exact Date)   BMI 37.24 kg/m  Body mass index is 37.24 kg/m. General appearance: Well nourished, well developed female in no acute distress.  Cardiovascular: normal s1 and s2.  No murmurs, rubs or gallops. Respiratory:  Clear to auscultation bilateral. Normal respiratory effort Abdomen: pbese, soft, nttp Neuro/Psych:  Normal mood and affect.  Skin:  Warm and dry.   Laboratory:  UPT negative  CBC Latest Ref Rng & Units 06/11/2018 05/20/2018 08/27/2015  WBC 3.4 - 10.8 x10E3/uL 6.9 10.3 8.5  Hemoglobin 11.1 - 15.9 g/dL 7.0(LL) 8.1(L) 8.5(L)  Hematocrit 34.0 - 46.6 % 27.6(L) 32.6(L) 30.1(L)  Platelets 150 - 450 x10E3/uL 602(H) 561(H) 564(H)   Radiology: CLINICAL DATA:  Menorrhagia, evaluate fibroids, iron deficiency anemia due to chronic blood loss  EXAM: TRANSABDOMINAL AND TRANSVAGINAL ULTRASOUND OF PELVIS  TECHNIQUE: Both transabdominal and transvaginal ultrasound examinations of the pelvis were performed. Transabdominal technique was performed for global imaging of the pelvis including uterus, ovaries, adnexal regions, and pelvic cul-de-sac. It was necessary to proceed with endovaginal exam following the transabdominal exam to visualize the endometrium.  COMPARISON:  12/10/2014  FINDINGS: Uterus  Measurements: 13.9 x 8.4 x 9.5 cm = volume: 580 mL. Enlarged nodular uterus can containing multiple leiomyomata. Large fundal mass, submucosal, 6.6 x 5.9 x 7.5 cm. Small posterior mid  uterine subserosal nodule 2.4 x 2.2 x 2.3 cm.  Endometrium  Thickness: Not delineated on either transabdominal or endovaginal imaging, suspect obscured by leiomyomata.  Right ovary  Measurements: 2.8 x 1.3 x 2.5 cm = volume: 5.0 mL. Normal morphology without mass  Left ovary  Measurements: 3.6 x 2.4 x 3.2 cm = volume: 14.3 mL. Complex cyst 2.7 x 1.7 x 2.6 cm containing scattered low level internal echogenicity.  Other findings  No free pelvic fluid. Additional complex hypoechoic nodule within LEFT adnexa, containing small simple appearing cystic focus with remainder a complex cystic component containing diffuse low level internal echogenicity, question hemorrhagic cyst versus endometrioma, lesion overall  4.3 x 2.9 x 4.3 cm.  IMPRESSION: Enlarged uterus containing multiple leiomyomata, largest a 7.5 cm diameter submucosal leiomyoma at the fundus.  Nonvisualization of endometrial complex likely obscured by uterine masses.  Small mildly complicated LEFT ovarian cyst 2.7 cm diameter with a 4.3 x 2.9 x 4.3 cm diameter complex 60 max adjacent to LEFT ovary with a large locule containing low level homogeneous internal echogenicity question endometrioma versus hemorrhagic cyst.   Electronically Signed   By: Lavonia Dana M.D.   On: 06/27/2018 16:07  CLINICAL DATA:  48 year old female with irregular menstrual cycles and abnormal uterine bleeding on Depo-Provera and a history of uterine fibroids. Intermittent pelvic pain. LMP 11/13/2014, day 28 of menstrual cycle.  EXAM: TRANSABDOMINAL AND TRANSVAGINAL ULTRASOUND OF PELVIS  TECHNIQUE: Both transabdominal and transvaginal ultrasound examinations of the pelvis were performed. Transabdominal technique was performed for global imaging of the pelvis including uterus, ovaries, adnexal regions, and pelvic cul-de-sac. It was necessary to proceed with endovaginal exam following the transabdominal exam to visualize  the endometrium, ovaries and adnexa.  COMPARISON:  08/31/2012 pelvic sonogram.  FINDINGS: Uterus  Measurements: 18.6 x 8.2 x 12.9 cm. The anteverted uterus is markedly enlarged by fibroids as follows:  - left posterior fundal intramural/ subserosal 9.1 x 8.3 x 9.1 cm fibroid, previously 6.4 x 6.5 x 7.7 cm, increased in size  - left anterior uterine body intramural 3.9 x 3.4 x 3.4 cm fibroid with likely 30-40% submucosal component, previously 3.7 x 3.1 x 3.4 cm, minimally increased in size  - right anterior uterine body subserosal 5.2 x 4.8 x 4.8 cm fibroid, not previously described  Endometrium  Fluid is seen within the endometrial cavity. The endometrium is poorly visualized due to distortion by the surrounding fibroids.  Right ovary  Measurements: 2.7 x 1.9 x 2.3 cm (transabdominal measurements). A simple 2.2 cm cyst in the right ovary is in keeping with a dominant right ovarian follicle. A tubular mildly thick walled cystic structure in the right adnexa with low level internal echoes is in keeping with a mild to moderate right hydrosalpinx, increased in caliber.  Left ovary  Measurements: 3.1 x 3.6 x 3.6 cm. No suspicious left ovarian masses. A tubular mildly thick-walled cystic structure in the left adnexa with low level internal echoes is in keeping with a mild to moderate left hydrosalpinx, not appreciably changed in caliber.  Other findings  No abnormal free fluid in the pelvis.  IMPRESSION: 1. Markedly enlarged myomatous uterus, with interval growth of the uterine fibroids, including a dominant 9.1 cm posterior fundal fibroid and a 3.9 cm left anterior uterine body fibroid that likely contains a small submucosal component. 2. Fluid in the endometrial cavity. Nondiagnostic evaluation of the endometrium due to distortion by the surrounding fibroids. 3. Bilateral mild-to-moderate hydrosalpinges. No suspicious ovarian or adnexal  masses.   Electronically Signed   By: Ilona Sorrel M.D.   On: 12/10/2014 14:28  Assessment: pt stable  Plan:  1. Menorrhagia with regular cycle D/w her re: various options: exp management, medical management, surgery. I wouldn't recommend exp management b/c of her anemia. Recommend continuing iron with OJ. For medical, I told her it's worth trying since her bleeding is regular and her fibroids are smaller than in prior u/s. I told her I'd recommend two depo provera shots to see the effect unless her bleeding gets worse on it; in that case, I'd recommend surgery. If surgery, I'd recommend either a hysteroscopic myomectomy, since there appears to be a large  submucosal fibroid or a hysterectomy. Pt is amenable to trying depo provera course which she received first shot today  If bleeding becomes irregular or going for hyst, will need pap smear  Pap and mammogram need; pt to give bcccp information    Interpreter used  RTC 17m for follow up and consideration of second depo provera injection  Durene Romans MD Attending Center for [facility] [employer] Fish farm manager)

## 2018-09-27 ENCOUNTER — Telehealth: Payer: Self-pay | Admitting: Obstetrics and Gynecology

## 2018-09-27 NOTE — Telephone Encounter (Signed)
Patient called and said her Rx had not been received by [facility] . She is requesting a call back from the office.

## 2018-10-03 ENCOUNTER — Telehealth: Payer: Self-pay

## 2018-10-03 ENCOUNTER — Other Ambulatory Visit: Payer: Self-pay

## 2018-10-03 DIAGNOSIS — N924 Excessive bleeding in the premenopausal period: Secondary | ICD-10-CM

## 2018-10-03 MED ORDER — TRANEXAMIC ACID 650 MG PO TABS: 1300.0000 mg | ORAL_TABLET | Freq: Three times a day (TID) | ORAL | Status: DC

## 2018-10-03 NOTE — Telephone Encounter (Signed)
Pharmacist at [facility] on [address] called about pts RX YTranexamic Acid, stating that they are not able to fill there, so pt requested for RX to be sent to [facility] on [address]. So updated Pharmacy info & sent Rx.

## 2018-10-03 NOTE — Telephone Encounter (Signed)
Called pt with [place] id# [phone], from [facility] to advise pt that her Rx Tranexamic Acid has been sent to [facility] on [address]. Pt verbalized understanding.

## 2018-10-04 ENCOUNTER — Other Ambulatory Visit: Payer: Self-pay

## 2018-10-04 MED ORDER — TRANEXAMIC ACID 650 MG PO TABS: 1300.0000 mg | ORAL_TABLET | Freq: Three times a day (TID) | ORAL | 1 refills | Status: DC

## 2018-10-04 MED ORDER — TRANEXAMIC ACID 650 MG PO TABS: 1300.0000 mg | ORAL_TABLET | Freq: Three times a day (TID) | ORAL | 1 refills | Status: AC

## 2018-10-10 ENCOUNTER — Other Ambulatory Visit (HOSPITAL_COMMUNITY): Payer: Self-pay | Admitting: *Deleted

## 2018-10-10 DIAGNOSIS — R922 Inconclusive mammogram: Secondary | ICD-10-CM

## 2018-12-04 ENCOUNTER — Other Ambulatory Visit: Payer: Self-pay

## 2018-12-04 ENCOUNTER — Encounter (HOSPITAL_COMMUNITY): Payer: Self-pay

## 2018-12-04 ENCOUNTER — Ambulatory Visit (HOSPITAL_COMMUNITY)
Admission: RE | Admit: 2018-12-04 | Discharge: 2018-12-04 | Disposition: A | Discharge status: Home / Self Care | Payer: No Typology Code available for payment source | Source: Ambulatory Visit | Attending: Obstetrics and Gynecology | Admitting: Obstetrics and Gynecology

## 2018-12-04 ENCOUNTER — Other Ambulatory Visit (HOSPITAL_COMMUNITY): Payer: Self-pay | Admitting: Obstetrics and Gynecology

## 2018-12-04 ENCOUNTER — Ambulatory Visit
Admission: RE | Admit: 2018-12-04 | Discharge: 2018-12-04 | Disposition: A | Discharge status: Home / Self Care | Payer: No Typology Code available for payment source | Source: Ambulatory Visit | Attending: Obstetrics and Gynecology | Admitting: Obstetrics and Gynecology

## 2018-12-04 DIAGNOSIS — R922 Inconclusive mammogram: Secondary | ICD-10-CM

## 2018-12-04 DIAGNOSIS — Z1239 Encounter for other screening for malignant neoplasm of breast: Secondary | ICD-10-CM | POA: Insufficient documentation

## 2018-12-04 DIAGNOSIS — N6310 Unspecified lump in the right breast, unspecified quadrant: Secondary | ICD-10-CM

## 2018-12-04 DIAGNOSIS — R9230 Dense breasts, unspecified: Secondary | ICD-10-CM

## 2018-12-04 IMAGING — US US BREAST*R* LIMITED INC AXILLA
1 series · 9 of 9 positions shown · non-contrast
Comparison: Previous exam(s).

CLINICAL DATA: Patient for delayed follow-up of probably benign
left breast mass.

EXAM:
DIGITAL DIAGNOSTIC BILATERAL MAMMOGRAM WITH CAD AND TOMO
ULTRASOUND RIGHT BREAST

[Series 1: us breast*right* limited inc axilla · 0.06mm/px · 9 of 9 slices shown]
[im 1/9]
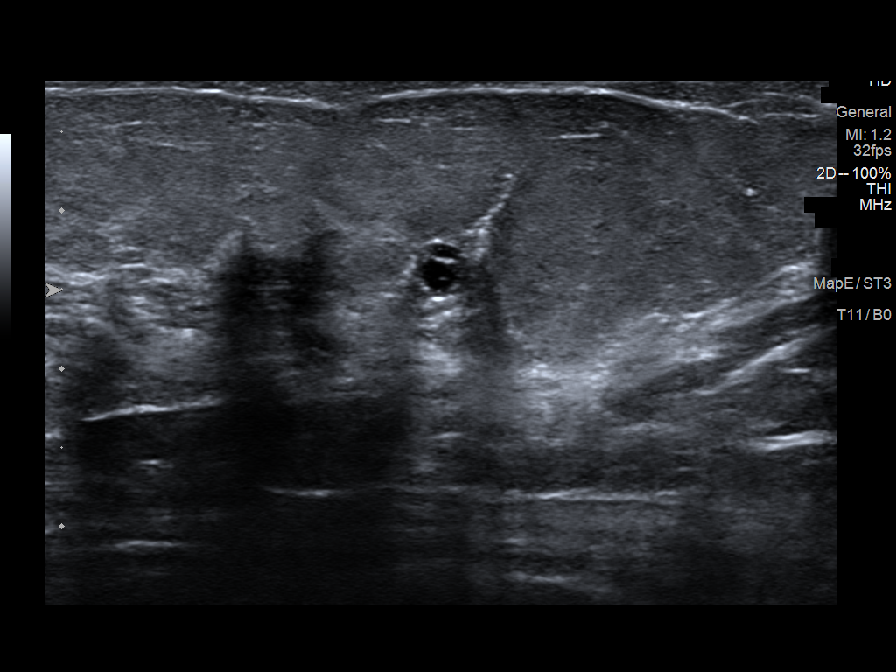
[im 2/9]
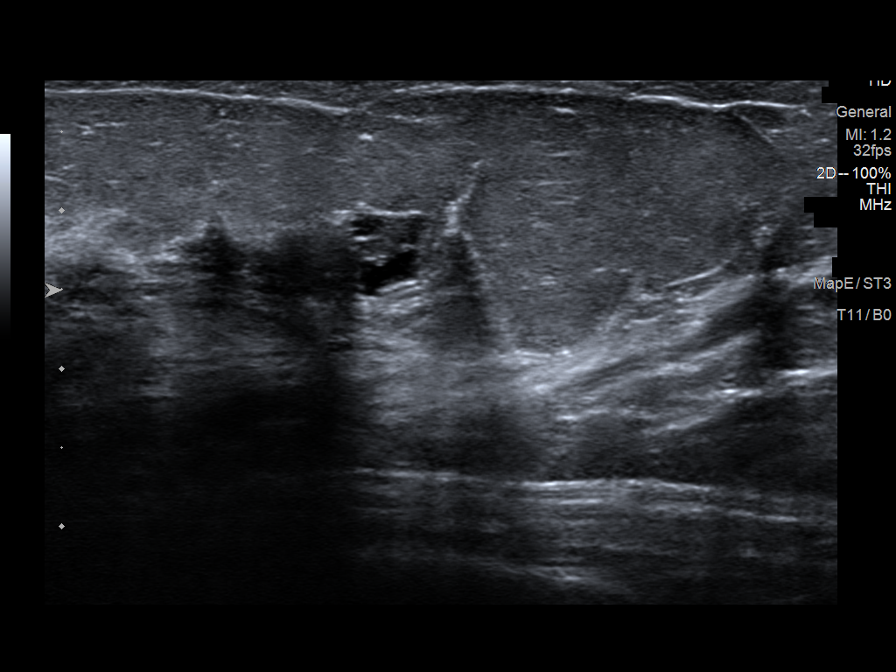
[im 3/9]
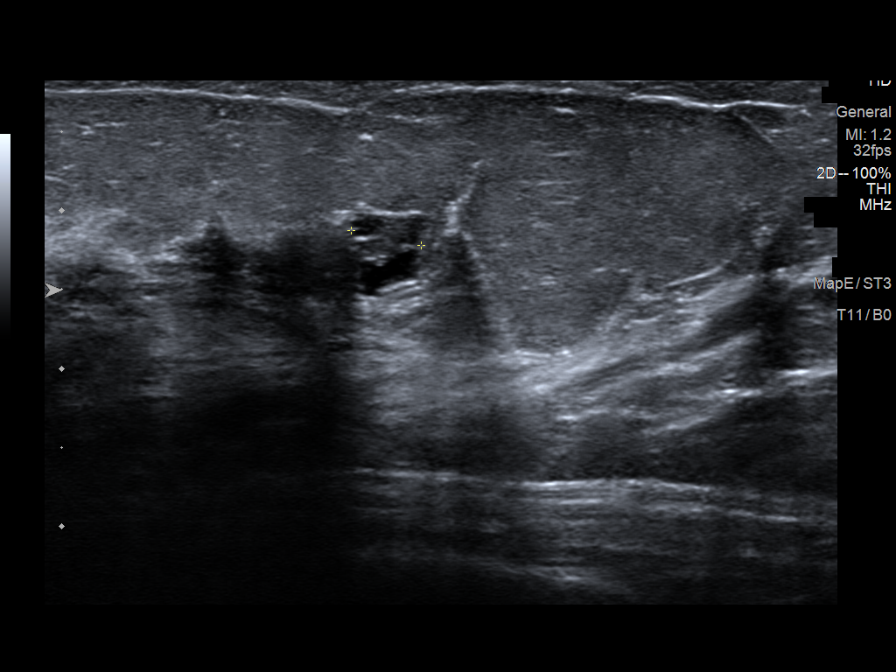
[im 4/9]
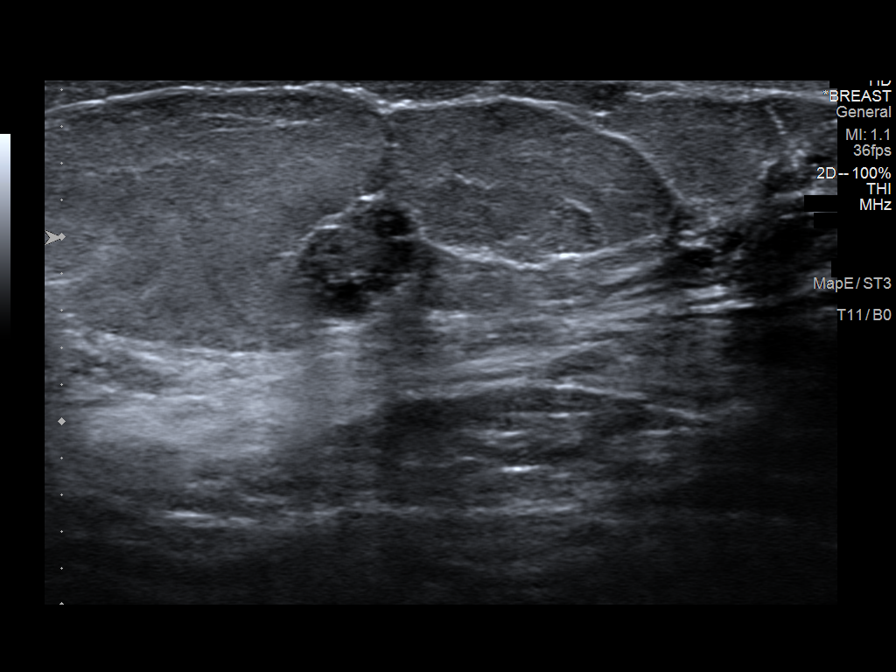
[im 5/9]
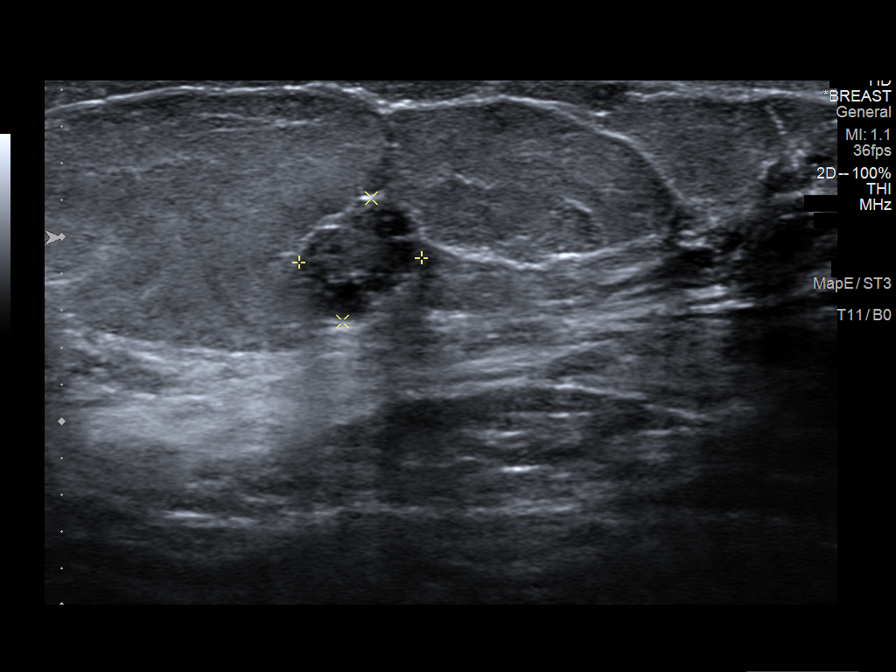
[im 6/9]
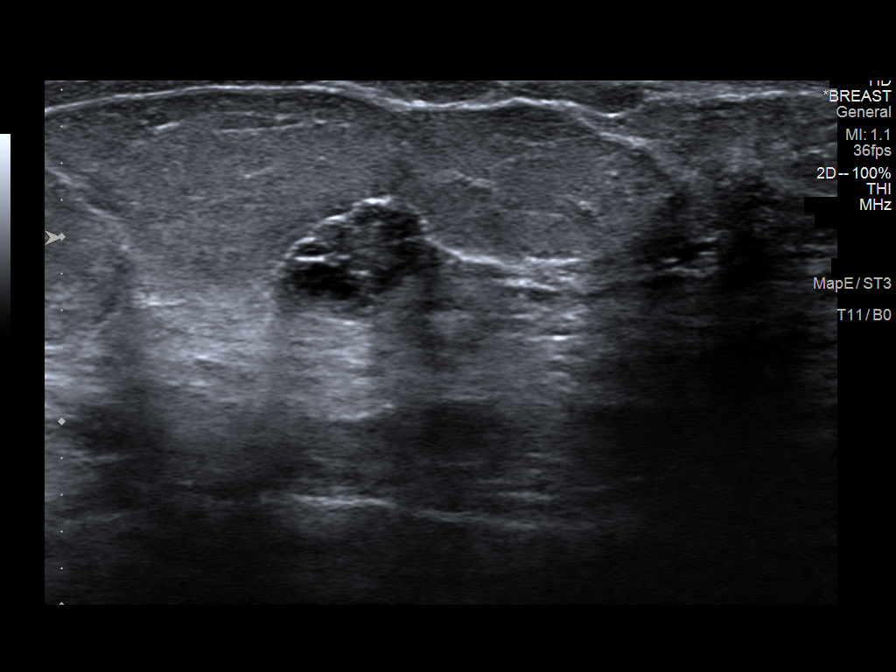
[im 7/9]
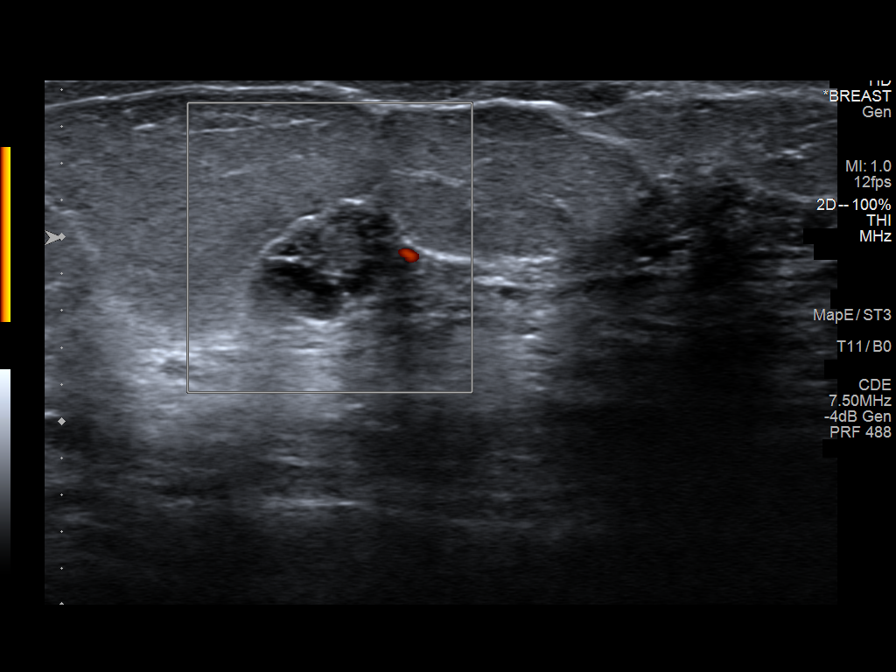
[im 8/9]
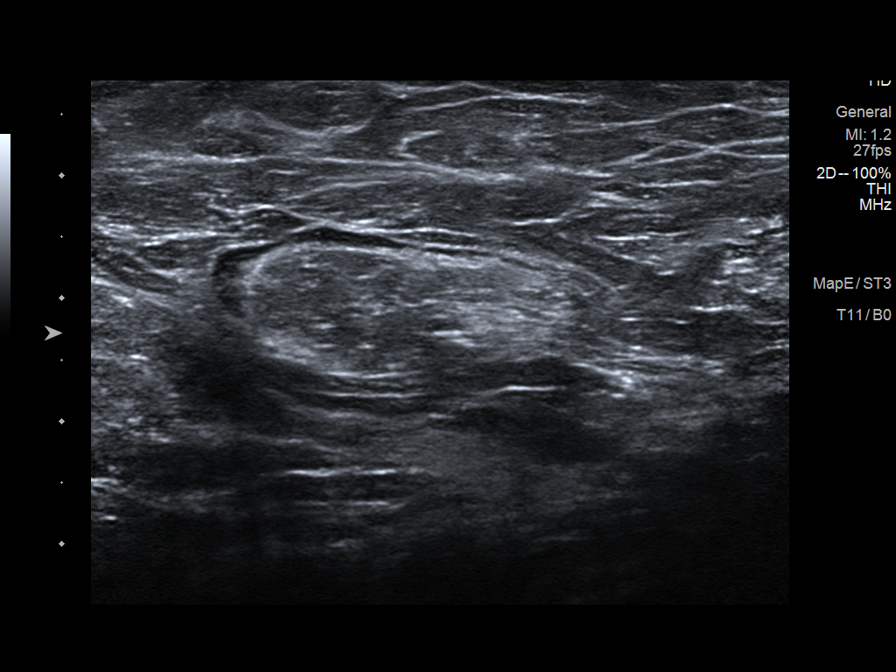
[im 9/9]
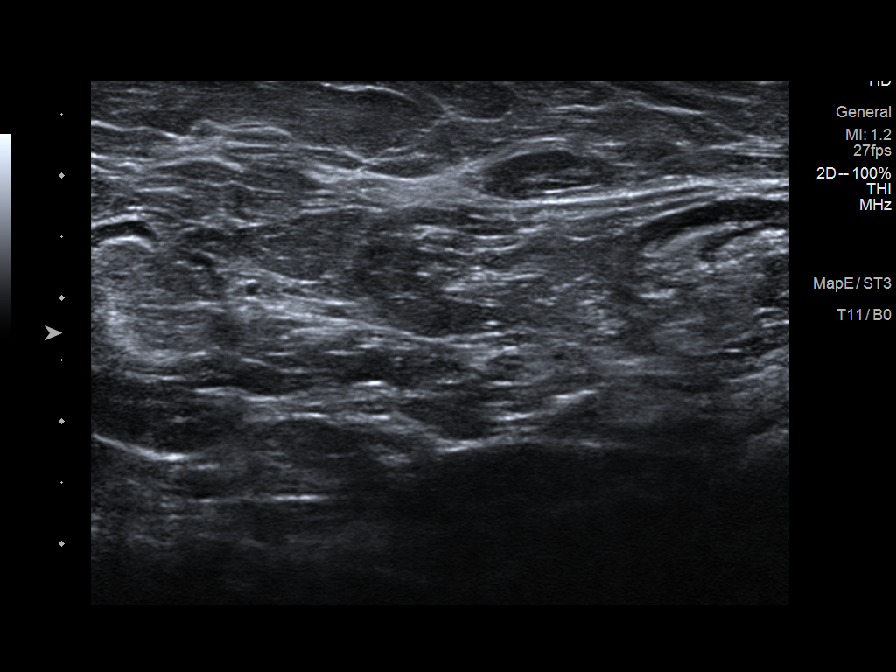

[9 of 9 positions shown; findings below may reference images not displayed]

ACR Breast Density Category c: The breast tissue is heterogeneously
dense, which may obscure small masses.
FINDINGS: No concerning masses, calcifications or distortion identified within
the left breast.

Within the anterior right breast laterally there is a small oval
lobular mass.

Mammographic images were processed with CAD.

Targeted ultrasound is performed, showing a 6 x 7 x 5 mm solid and
cystic mass right breast 9 o'clock position 1 cm from the nipple. No
right axillary adenopathy.
IMPRESSION: Indeterminate right breast mass 9 o'clock position.

No evidence for malignancy within the left breast.

RECOMMENDATION:
Ultrasound-guided core needle biopsy right breast mass 9 o'clock
position.

I have discussed the findings and recommendations with the patient.
Results were also provided in writing at the conclusion of the
visit. If applicable, a reminder letter will be sent to the patient
regarding the next appointment.

BI-RADS CATEGORY  4: Suspicious.

## 2018-12-04 IMAGING — MG MM DIGITAL DIAGNOSTIC BILAT W/ TOMO W/ CAD
8 series · 8 of 24 positions shown · non-contrast
Comparison: Previous exam(s).

CLINICAL DATA: Patient for delayed follow-up of probably benign
left breast mass.

EXAM:
DIGITAL DIAGNOSTIC BILATERAL MAMMOGRAM WITH CAD AND TOMO
ULTRASOUND RIGHT BREAST

[L MLO synth-2D]
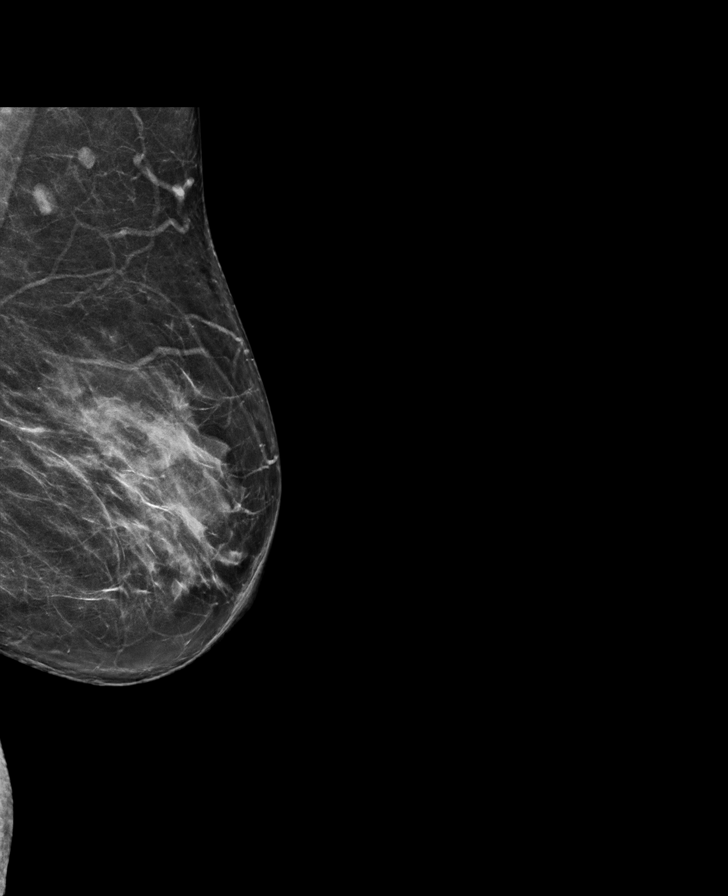

[L CC synth-2D]
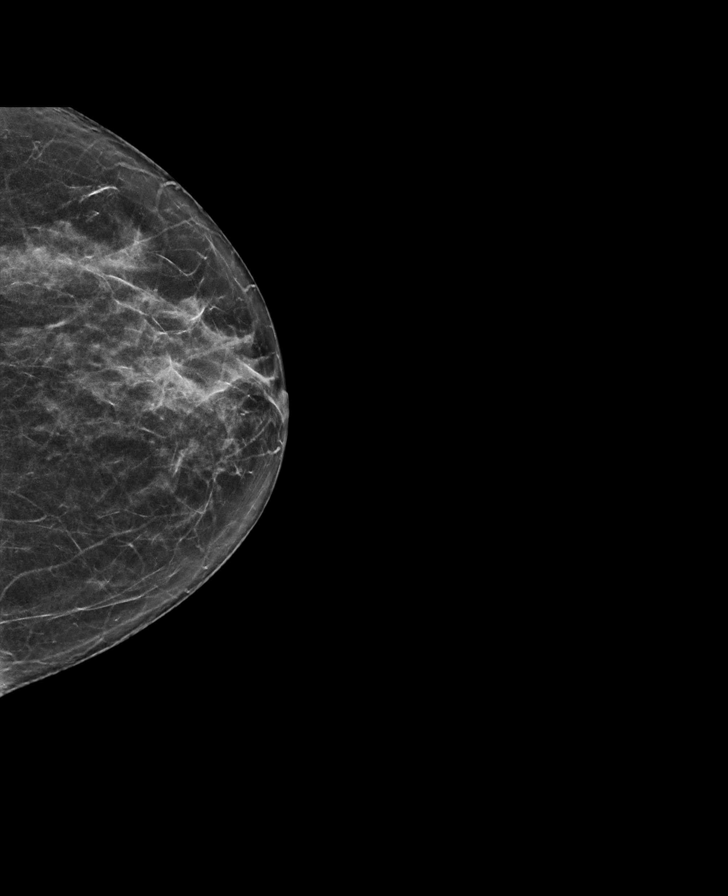

[R CC synth-2D]
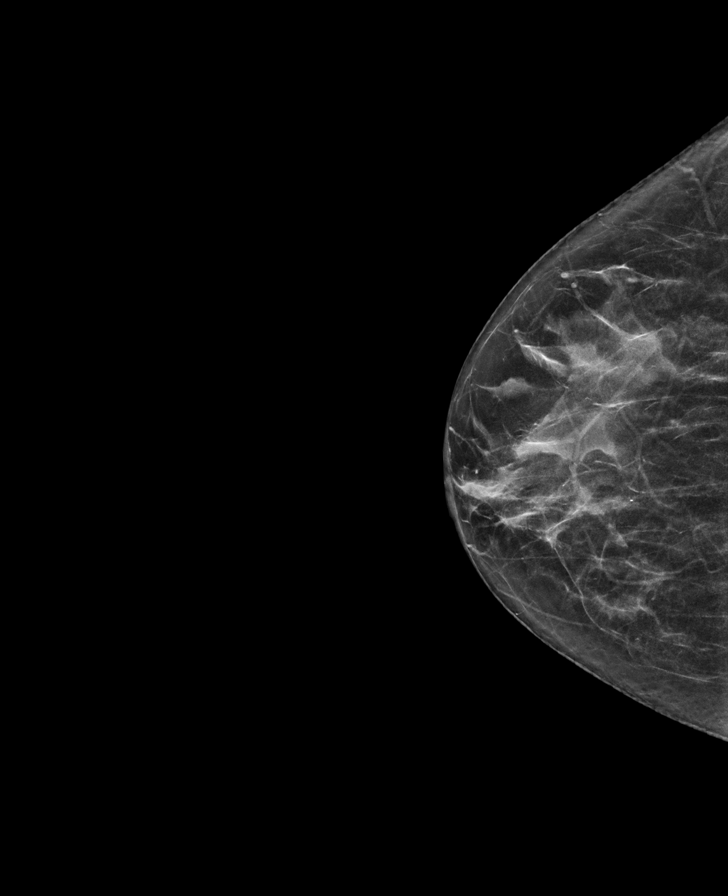

[R MLO synth-2D]
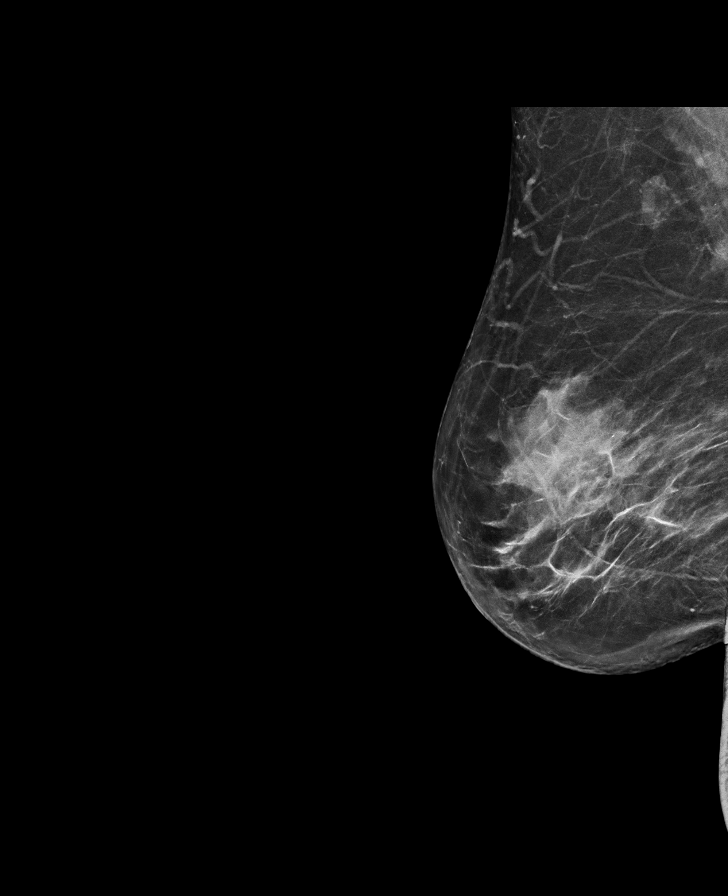

[L MLO tomo · tomo slice 35/69.0]
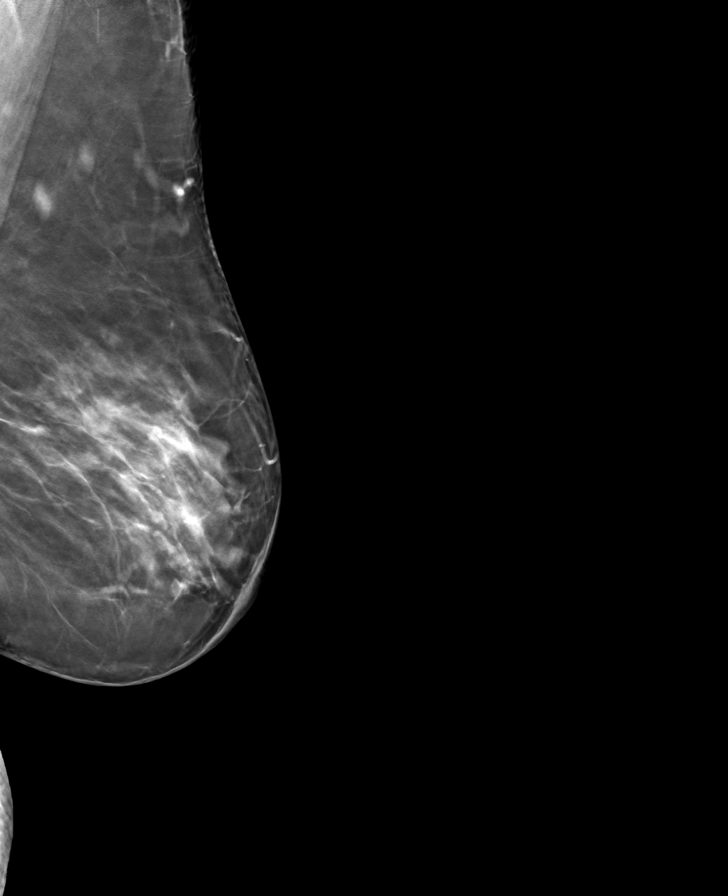

[R CC tomo · tomo slice 29/56.0]
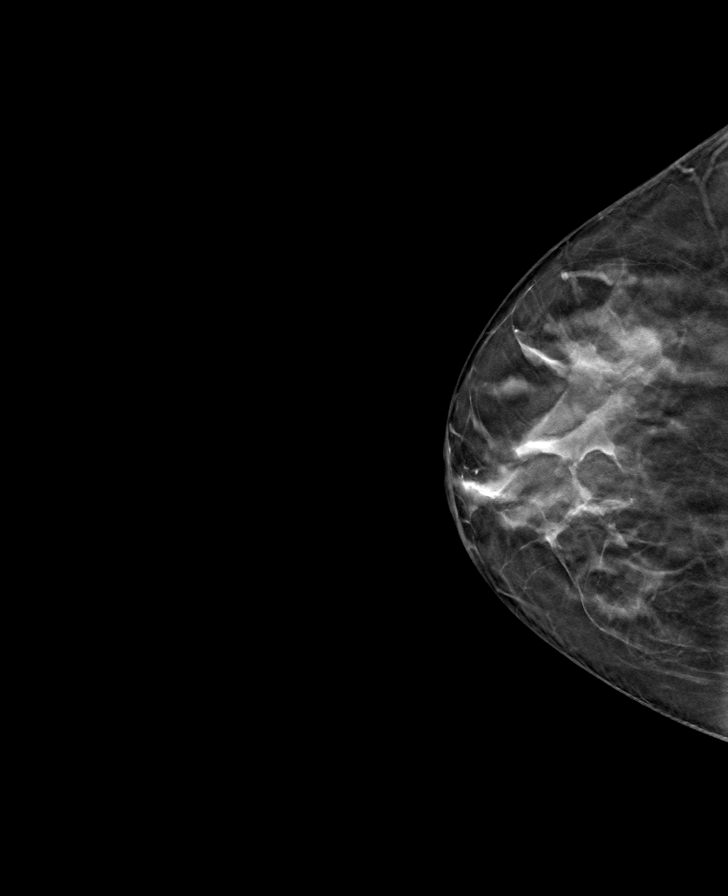

[L CC tomo · tomo slice 29/58.0]
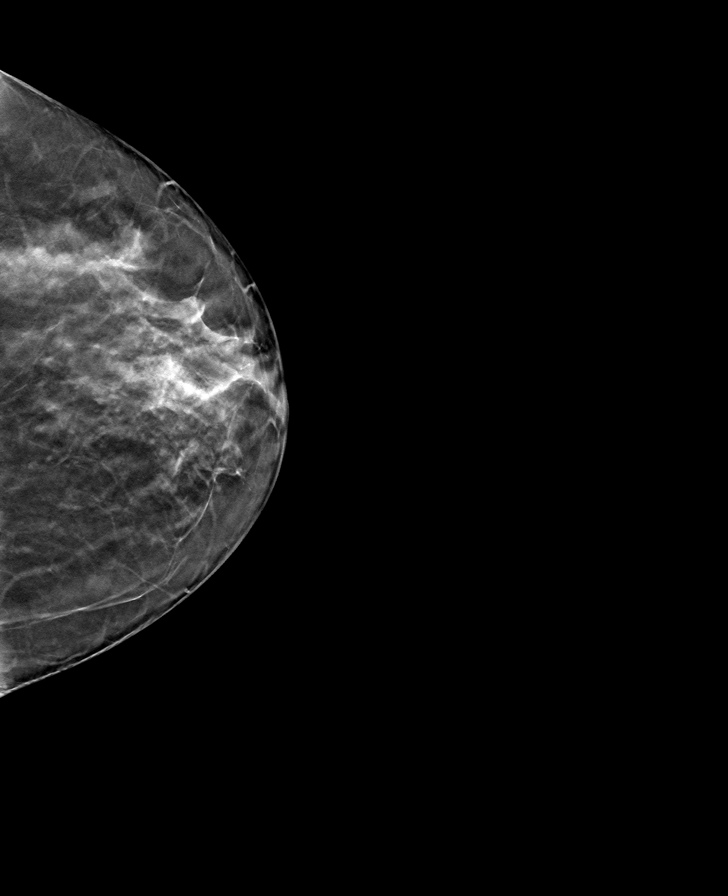

[R MLO tomo · tomo slice 37/73.0]
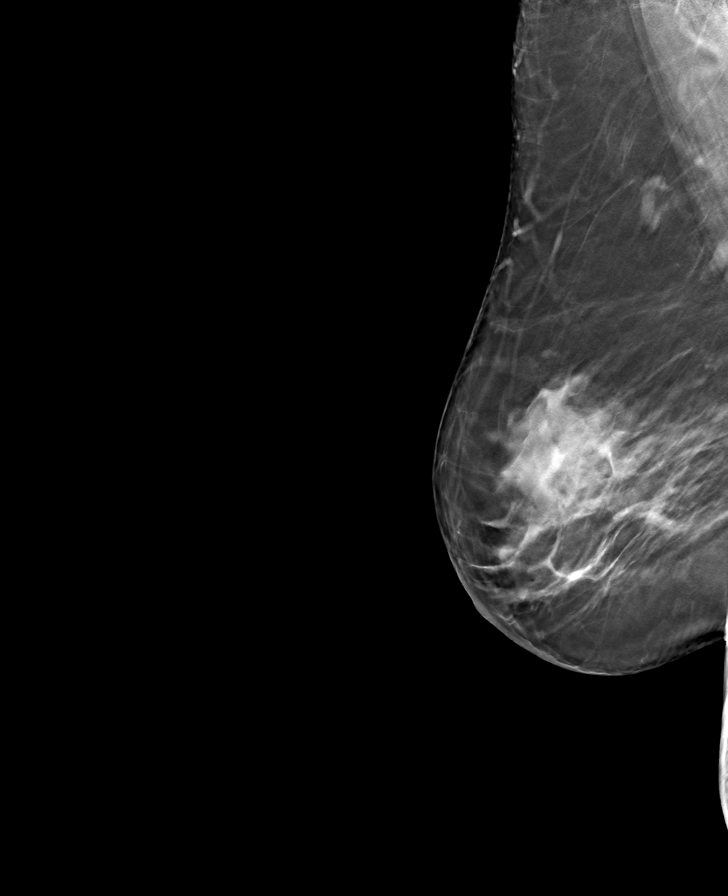

[8 of 24 positions shown; findings below may reference images not displayed]

ACR Breast Density Category c: The breast tissue is heterogeneously
dense, which may obscure small masses.
FINDINGS: No concerning masses, calcifications or distortion identified within
the left breast.

Within the anterior right breast laterally there is a small oval
lobular mass.

Mammographic images were processed with CAD.

Targeted ultrasound is performed, showing a 6 x 7 x 5 mm solid and
cystic mass right breast 9 o'clock position 1 cm from the nipple. No
right axillary adenopathy.
IMPRESSION: Indeterminate right breast mass 9 o'clock position.

No evidence for malignancy within the left breast.

RECOMMENDATION:
Ultrasound-guided core needle biopsy right breast mass 9 o'clock
position.

I have discussed the findings and recommendations with the patient.
Results were also provided in writing at the conclusion of the
visit. If applicable, a reminder letter will be sent to the patient
regarding the next appointment.

BI-RADS CATEGORY  4: Suspicious.

## 2018-12-04 NOTE — Progress Notes (Signed)
Patient referred to [facility]CP due to having a diagnostic mammogram completed 04/03/2013 that 69-month follow-up was recommended. No follow-up was completed.  Pap Smear: Pap smear not completed today. Last Pap smear was in 2018 at one of the free cervical cancer screenings at [facility] and normal per patient. Per patient has no history of an abnormal Pap smear. Last Pap smear result is not in Epic. Previous Pap smear result from 09/02/2014 is in [place].   Physical exam: Breasts Breasts symmetrical. No skin abnormalities bilateral breasts. No nipple retraction bilateral breasts. No nipple discharge bilateral breasts. No lymphadenopathy. No lumps palpated bilateral breasts. No complaints of pain or tenderness on exam. Referred patient to the [place] for a diagnostic mammogram per recommendation. Appointment scheduled for Tuesday, December 04, 2018 at 1250.        Pelvic/Bimanual No Pap smear completed today since last Pap smear was in 2018 per patient. Pap smear not indicated per BCCCP guidelines.   Smoking History: Patient has never smoked.  Patient Navigation: Patient education provided. Access to services provided for patient through [facility] program. Spanish interpreter provided.   Breast and Cervical Cancer Risk Assessment: Patient has no family history of breast cancer, known genetic mutations, or radiation treatment to the chest before age 54. Patient has no history of cervical dysplasia, immunocompromised, or DES exposure in-utero.  Risk Assessment    Risk Scores      12/04/2018   Last edited by: Armond Hang, LPN   5-year risk: 0.4 %   Lifetime risk: 4.9 %          Used Spanish interpreter Rudene Anda from [place].

## 2018-12-04 NOTE — Patient Instructions (Signed)
Explained breast self awareness with Mariah Lang. Patient did not need a Pap smear today due to last Pap smear was in  2018 per patient.  Let her know [facility] will cover Pap smears every 3 years unless has a history of abnormal Pap smears. Referred patient to the [facility] for a diagnostic mammogram per recommendation. Appointment scheduled for Tuesday, December 04, 2018 at 1250. Patient aware of appointment and will be there. Mariah Lang verbalized understanding.  Mariah Lang, Arvil Chaco, RN 11:57 AM

## 2018-12-11 ENCOUNTER — Ambulatory Visit
Admission: RE | Admit: 2018-12-11 | Discharge: 2018-12-11 | Disposition: A | Discharge status: Home / Self Care | Payer: No Typology Code available for payment source | Source: Ambulatory Visit | Attending: Obstetrics and Gynecology | Admitting: Obstetrics and Gynecology

## 2018-12-11 ENCOUNTER — Telehealth: Payer: Self-pay | Admitting: Family Medicine

## 2018-12-11 ENCOUNTER — Other Ambulatory Visit: Payer: Self-pay

## 2018-12-11 DIAGNOSIS — N6310 Unspecified lump in the right breast, unspecified quadrant: Secondary | ICD-10-CM

## 2018-12-11 IMAGING — MG MM BREAST LOCALIZATION CLIP
4 series · 4 of 8 positions shown · non-contrast
Comparison: Previous exam(s).

CLINICAL DATA: Patient underwent biopsy of a right breast mass.

EXAM:
DIAGNOSTIC RIGHT MAMMOGRAM POST ULTRASOUND BIOPSY

[R CC]
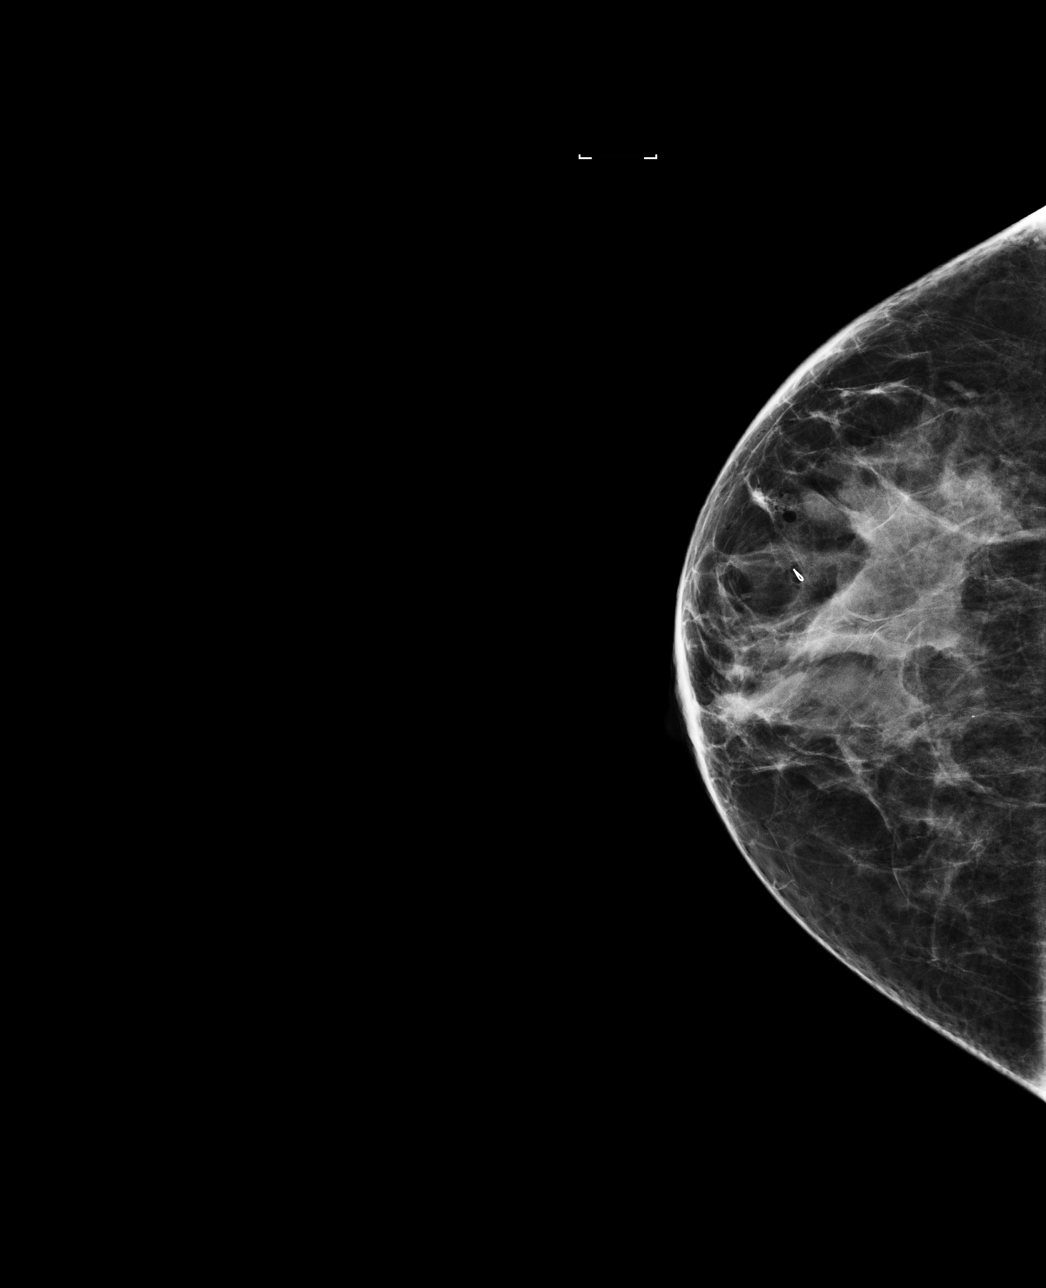

[R ML]
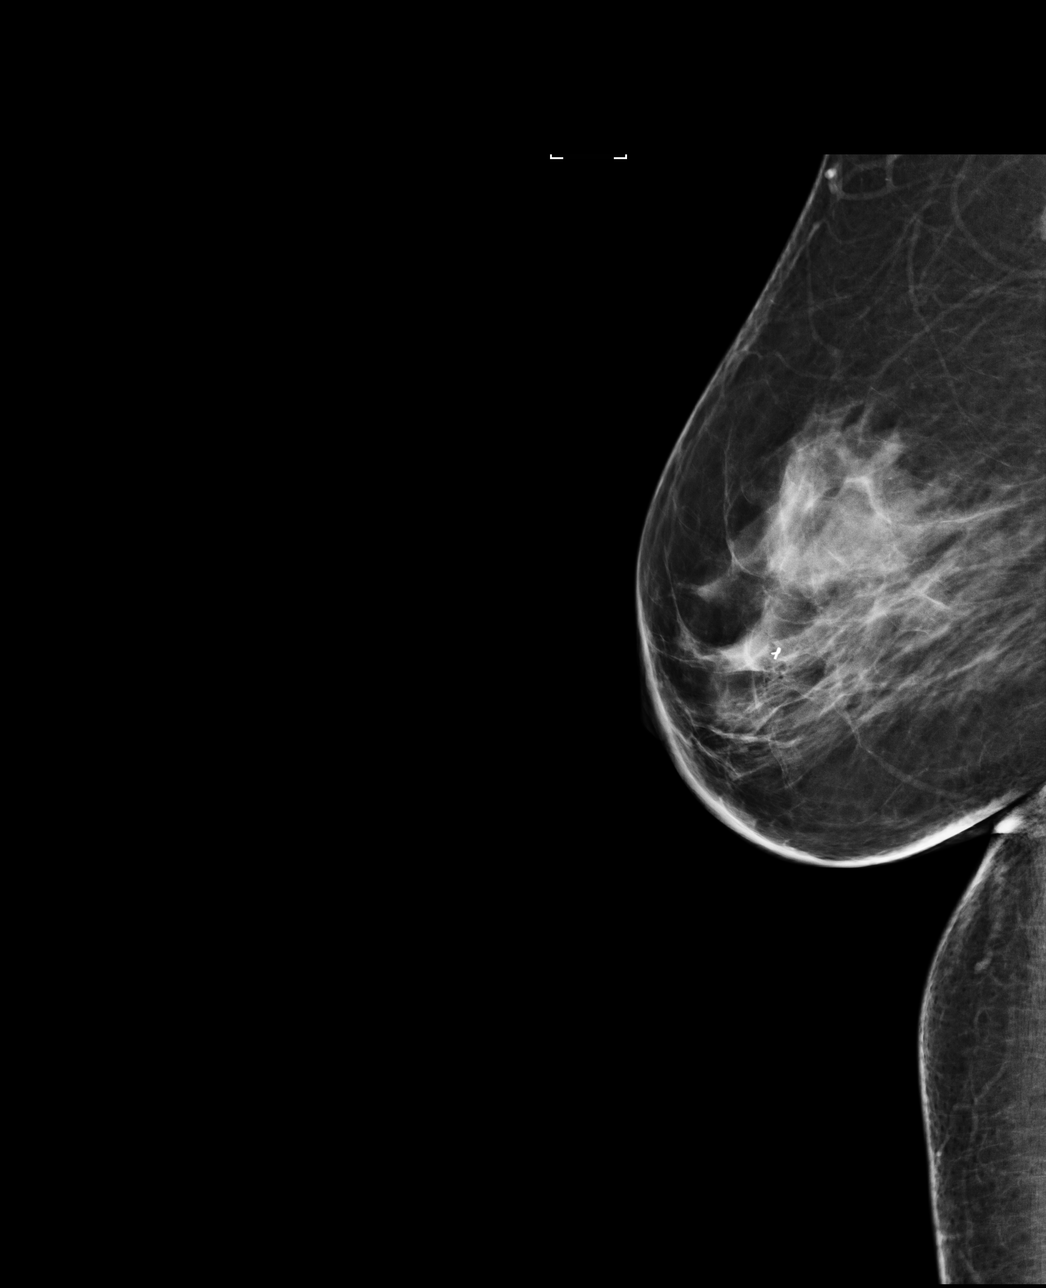

[R CC synth-2D]
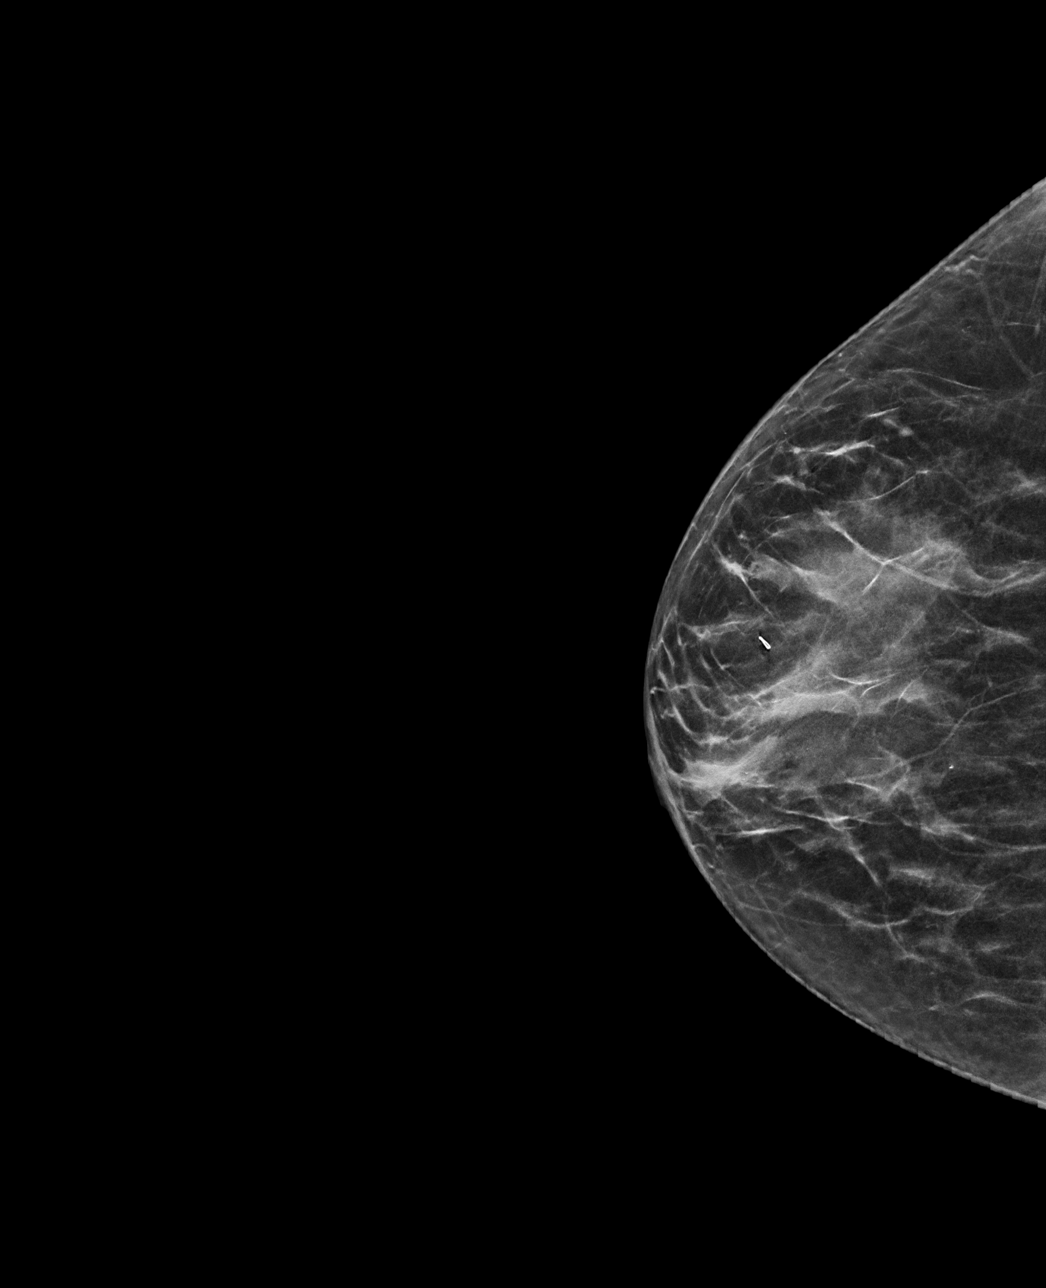

[R CC tomo · tomo slice 28/55.0]
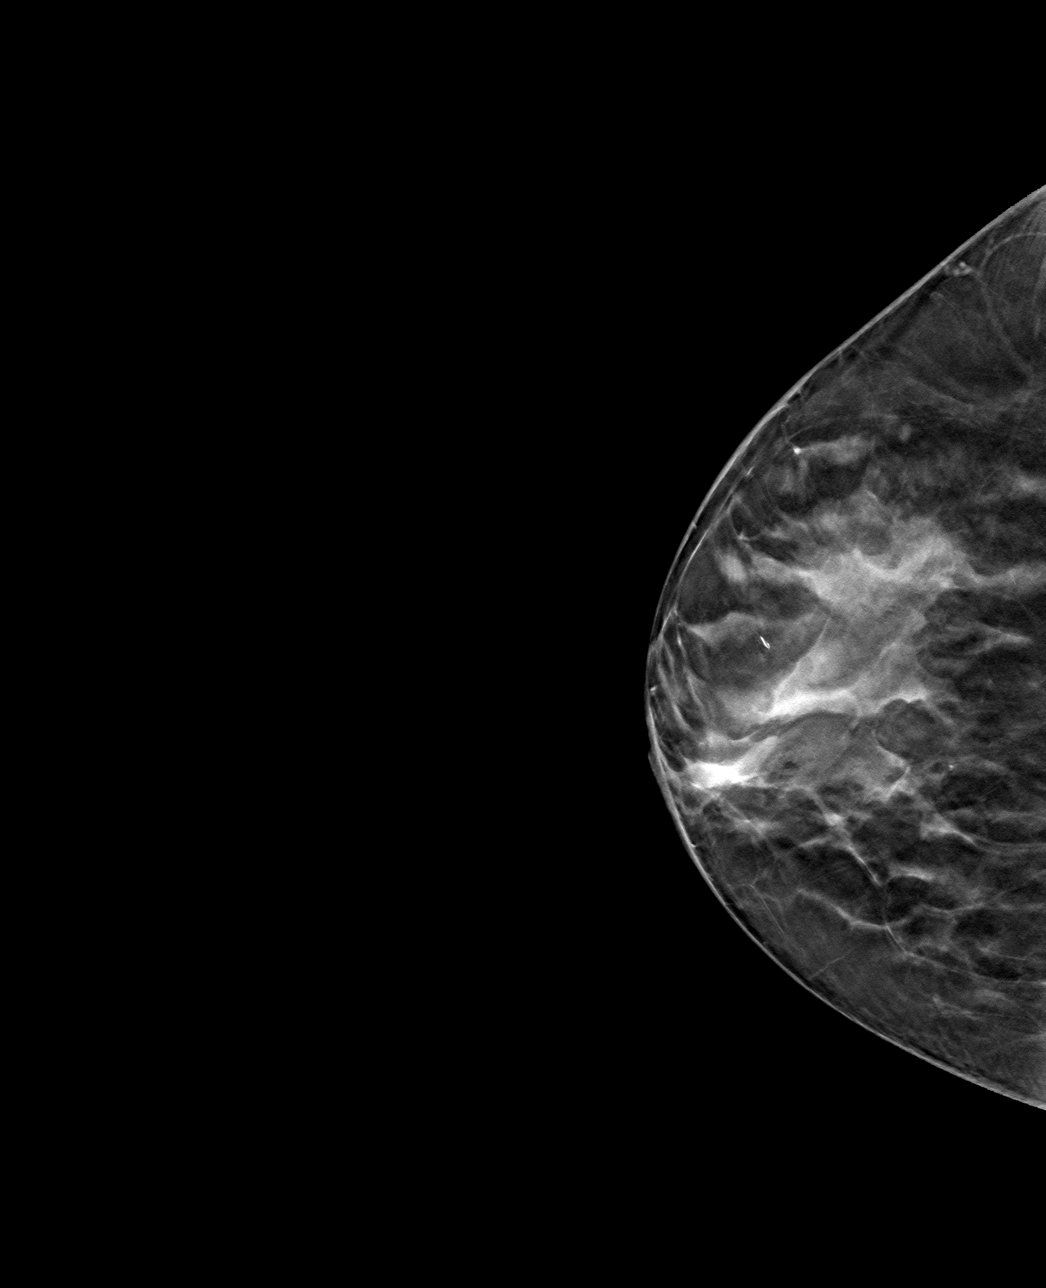

[4 of 8 positions shown; findings below may reference images not displayed]

FINDINGS: Mammographic images were obtained following ultrasound-guided guided
biopsy of right breast mass at 9 o'clock. Post biopsy clip films
demonstrate appropriate placement of a ribbon clip at the biopsy
site. The biopsied mass is no longer present on the post clip films.
IMPRESSION: Appropriate placement of a ribbon clip at the biopsy site in the
right breast at 9 o'clock.

Final Assessment: Post Procedure Mammograms for Marker Placement

## 2018-12-11 IMAGING — US US  BREAST BX W/ LOC DEV 1ST LESION IMG BX SPEC US GUIDE*R*
1 series · 10 of 10 positions shown · non-contrast
Comparison: Previous exam(s).
COMPARISON: Previous exam(s).

Addendum:
CLINICAL DATA: Patient presents for biopsy of a right breast mass.

EXAM:
ULTRASOUND GUIDED RIGHT BREAST CORE NEEDLE BIOPSY

[Series 1: us breast bx w/ loc dev 1st lesion img bx spec us  · 0.06mm/px · 10 of 10 slices shown]
[im 1/10]
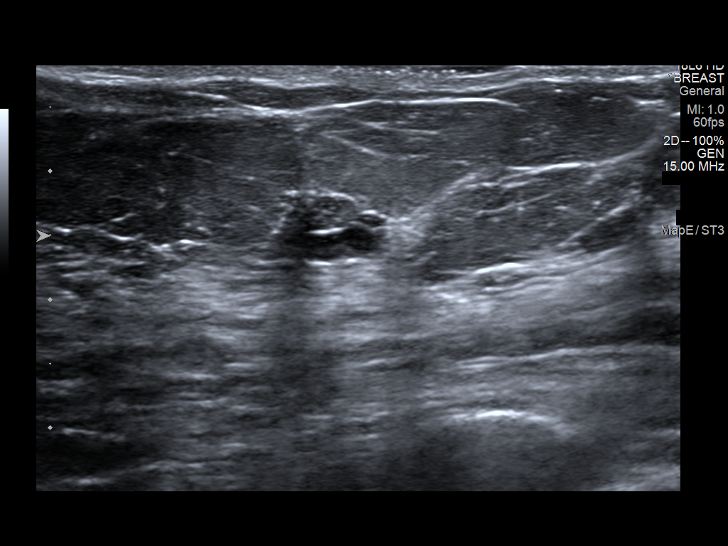
[im 2/10]
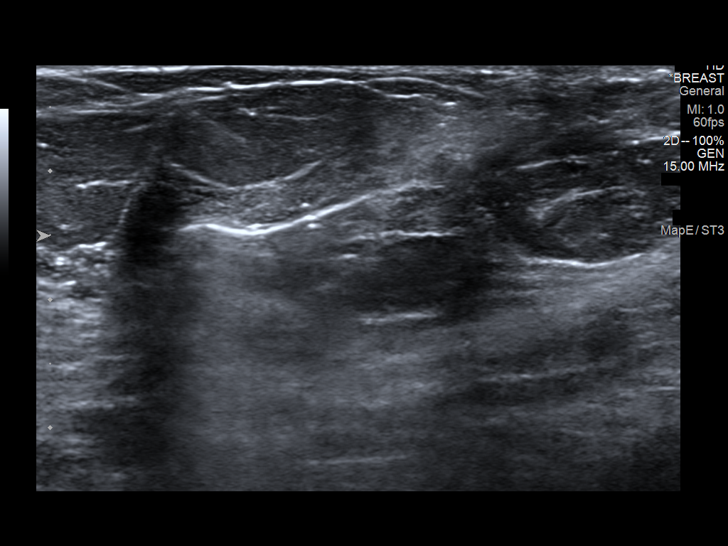
[im 3/10]
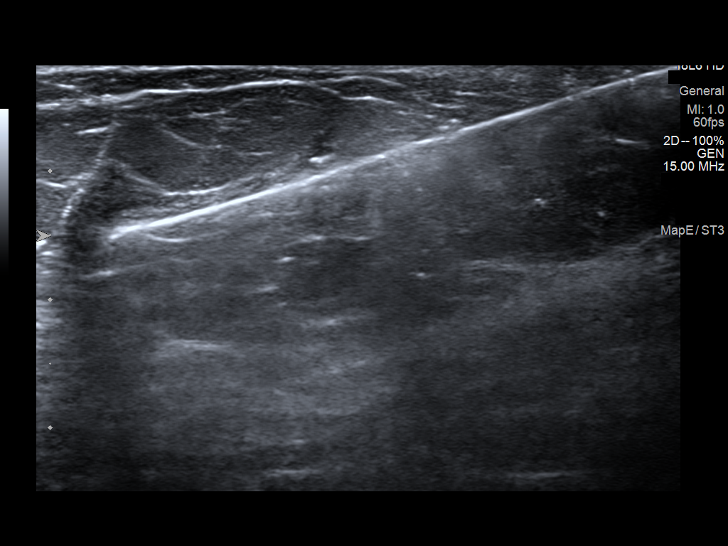
[im 4/10]
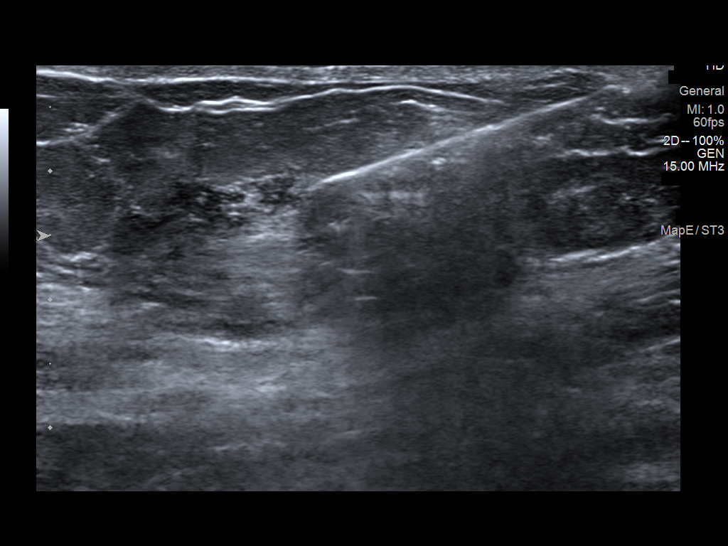
[im 5/10]
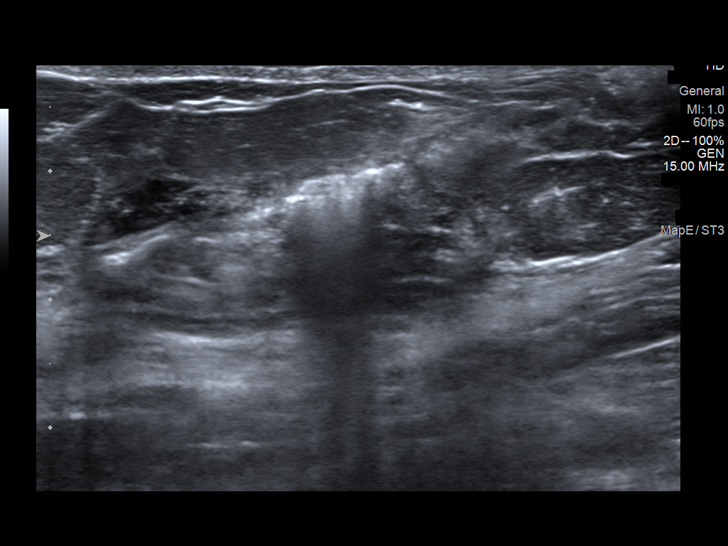
[im 6/10]
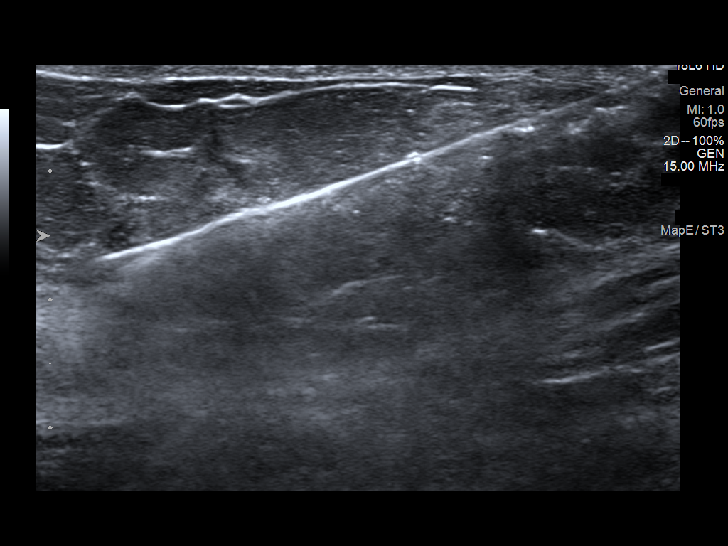
[im 7/10]
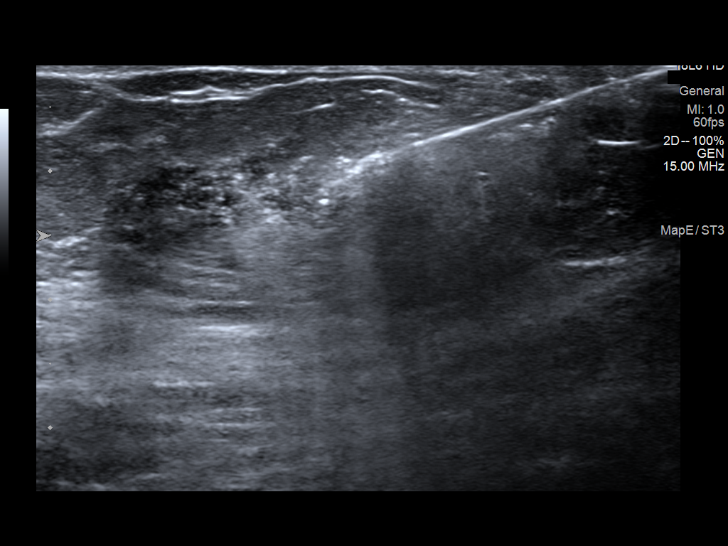
[im 8/10]
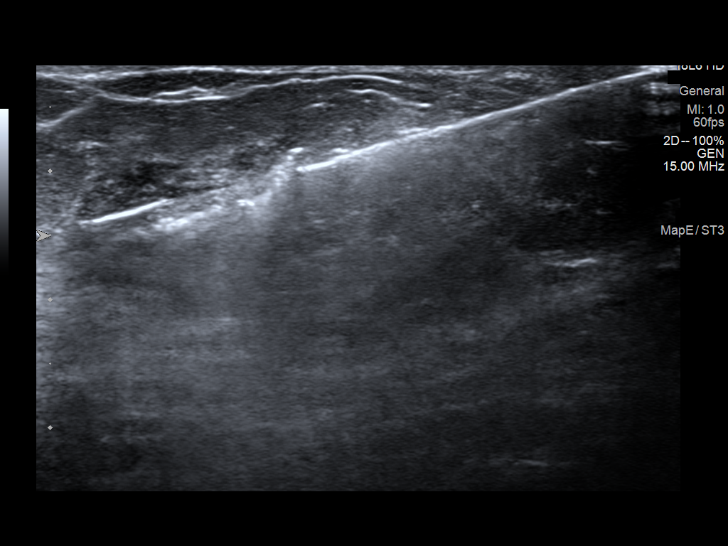
[im 9/10]
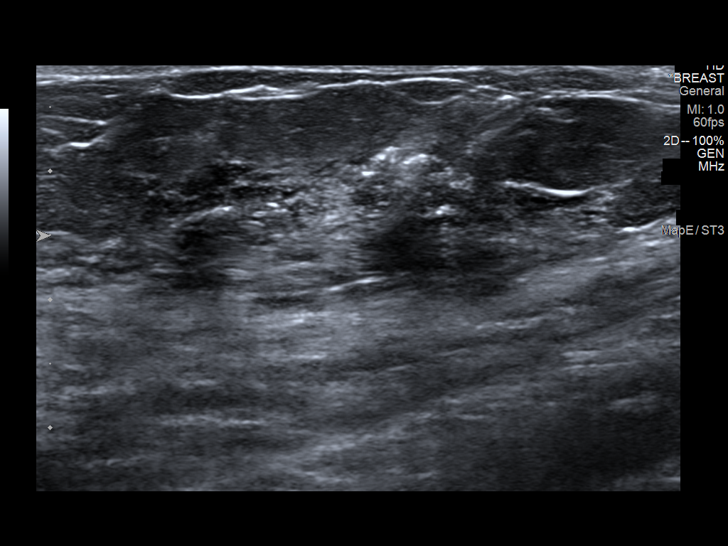
[im 10/10]
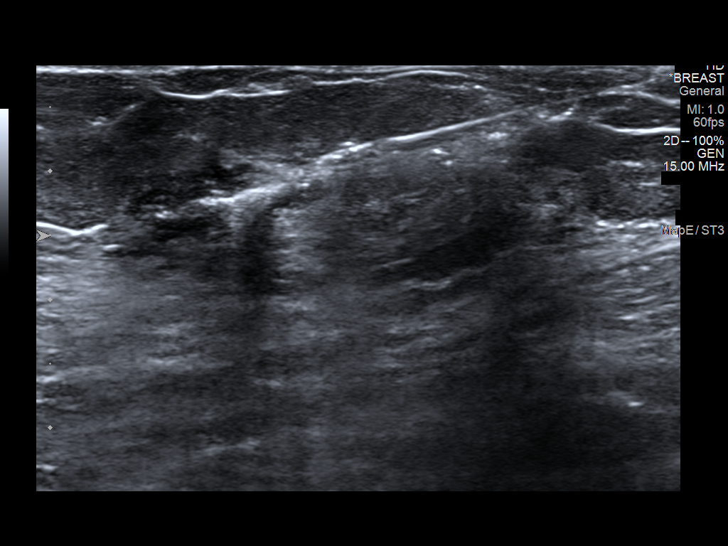

[10 of 10 positions shown; findings below may reference images not displayed]



Lesion quadrant: Upper outer quadrant

Using sterile technique and 1% Lidocaine as local anesthetic, under
direct ultrasound visualization, a 12 gauge RYHAB device was
used to perform biopsy of right breast mass at 9 o'clock using a
inferior approach. Following the first biopsy pass the mass was no
longer visualized sonographically, suggesting it was a cluster of
cysts. At the conclusion of the procedure a ribbon tissue marker
clip was deployed into the biopsy cavity. Follow up 2 view mammogram
was performed and dictated separately.
IMPRESSION: Ultrasound guided biopsy of right breast mass at 9 o'clock. No
apparent complications.

ADDENDUM:
Pathology revealed FIBROCYSTIC CHANGE, INCLUDING ADENOSIS AND
PSEUDO-ANGIOMATOUS STROMAL HYPERPLASIA of the Right breast, 9
o'clock. This was found to be concordant by Dr. RYHAB.

Pathology results were discussed with the patient by telephone by
patient reported doing well after the biopsy with tenderness at the
site. Post biopsy instructions and care were reviewed and questions
were answered. The patient was encouraged to call The [REDACTED]

The patient was instructed to return for annual screening
mammography and informed a reminder notice would be sent regarding
this appointment.

Pathology results reported by RYHAB, RN on [DATE].



Lesion quadrant: Upper outer quadrant

Using sterile technique and 1% Lidocaine as local anesthetic, under
direct ultrasound visualization, a 12 gauge RYHAB device was
used to perform biopsy of right breast mass at 9 o'clock using a
inferior approach. Following the first biopsy pass the mass was no
longer visualized sonographically, suggesting it was a cluster of
cysts. At the conclusion of the procedure a ribbon tissue marker
clip was deployed into the biopsy cavity. Follow up 2 view mammogram
was performed and dictated separately.
IMPRESSION: Ultrasound guided biopsy of right breast mass at 9 o'clock. No
apparent complications.

## 2018-12-11 NOTE — Telephone Encounter (Signed)
Attempted to contact patient w/ Spanish interpreter ID# [phone] about her appointment on 9/9 @ 8:30. No answer, interpreter left a voicemail instructing patient to wear a face mask for the entire appointment and no visitors are allowed. Patient instructed not to attend the appointment if she has any symptoms. Symptom list and office number left.

## 2018-12-12 ENCOUNTER — Other Ambulatory Visit: Payer: Self-pay

## 2018-12-12 ENCOUNTER — Ambulatory Visit (INDEPENDENT_AMBULATORY_CARE_PROVIDER_SITE_OTHER): Payer: Self-pay | Admitting: General Practice

## 2018-12-12 VITALS — BP 153/63 | HR 69 | Ht 60.24 in | Wt 184.0 lb

## 2018-12-12 DIAGNOSIS — N938 Other specified abnormal uterine and vaginal bleeding: Secondary | ICD-10-CM

## 2018-12-12 MED ORDER — MEDROXYPROGESTERONE ACETATE 150 MG/ML IM SUSP
150.0000 mg | Freq: Once | INTRAMUSCULAR | Status: AC
  Administered 2018-12-12: 150 mg via INTRAMUSCULAR

## 2018-12-12 NOTE — Progress Notes (Signed)
I have reviewed this chart and agree with the RN/CMA assessment and management.    Linkoln Alkire C Khaleef Ruby, MD, FACOG Attending Physician, Faculty Practice [facility]  

## 2018-12-12 NOTE — Progress Notes (Signed)
[place] here for Depo-Provera  Injection.  Injection administered without complication. Patient will return to see Dr Ilda Basset for follow up .  Derinda Late, RN 12/12/2018  8:34 AM

## 2018-12-17 ENCOUNTER — Encounter: Payer: Self-pay | Admitting: *Deleted

## 2018-12-28 ENCOUNTER — Telehealth: Payer: Self-pay | Admitting: Obstetrics and Gynecology

## 2018-12-28 NOTE — Telephone Encounter (Signed)
Spanish interpreter Raquel called patient about her appointment on 9/28 @ 8:15. Patient instructed to wear a face mask for the entire appointment and no visitors are allowed. Patient screened for covid symptoms and denied having any.

## 2018-12-31 ENCOUNTER — Other Ambulatory Visit: Payer: Self-pay

## 2018-12-31 ENCOUNTER — Ambulatory Visit (INDEPENDENT_AMBULATORY_CARE_PROVIDER_SITE_OTHER): Payer: Self-pay | Admitting: Obstetrics and Gynecology

## 2018-12-31 VITALS — BP 122/75 | HR 74 | Temp 98.2°F | Wt 183.0 lb

## 2018-12-31 DIAGNOSIS — N924 Excessive bleeding in the premenopausal period: Secondary | ICD-10-CM

## 2018-12-31 DIAGNOSIS — D259 Leiomyoma of uterus, unspecified: Secondary | ICD-10-CM

## 2018-12-31 DIAGNOSIS — Z789 Other specified health status: Secondary | ICD-10-CM

## 2018-12-31 DIAGNOSIS — D509 Iron deficiency anemia, unspecified: Secondary | ICD-10-CM

## 2018-12-31 MED ORDER — CALCIUM 1200 1200-1000 MG-UNIT PO CHEW: 1.0000 | CHEWABLE_TABLET | Freq: Every day | ORAL | Status: DC

## 2018-12-31 MED ORDER — VITAMIN D 25 MCG (1000 UNIT) PO TABS: 1000.0000 [IU] | ORAL_TABLET | Freq: Every day | ORAL | Status: DC

## 2018-12-31 NOTE — Progress Notes (Signed)
Still no bleeding. Wants to discuss not having a cycle from the Lysteda. 3 months ago.

## 2018-12-31 NOTE — Progress Notes (Signed)
Obstetrics and Gynecology Visit Return Patient Evaluation  Appointment Date: 12/31/2018  Primary Care Provider: Patient, No Pcp Per  OBGYN Clinic: [facility] for [facility]  Chief Complaint: follow up AUB, fibroids  History of Present Illness:  Mariah Lang is a 48 y.o. with above CC. Patient seen by me for heavy, regular periods in June 2020. She was started on depo provera with 2nd shot early this month.   She has no more heavy bleeding and sometimes has cramping and spotting/discharge, when she is supposed to have her periods. She doesn't need the Lysteda and is still taking the iron.    Review of Systems:  as noted in the History of Present Illness.   Patient Active Problem List   Diagnosis Date Noted  . Language barrier 12/31/2018  . Screening breast examination 12/04/2018  . Fibroid uterus 12/18/2014  . Menorrhagia, premenopausal 11/24/2014  . Iron deficiency anemia 11/24/2014  . Iron deficiency anemia due to chronic blood loss 09/02/2014  . Pap smear for cervical cancer screening 09/02/2014  . Annual physical exam 09/02/2014  . Dysmenorrhea 02/15/2013  . Anemia due to Dysmenorrhea 02/15/2013   Medications:  Iron Depo provera  Allergies: has No Known Allergies.  Physical Exam:  BP 122/75   Pulse 74   Temp 98.2 F (36.8 C)   Wt 183 lb (83 kg)   BMI 35.46 kg/m  Body mass index is 35.46 kg/m. General appearance: Well nourished, well developed female in no acute distress.  Neuro/Psych:  Normal mood and affect.     Assessment: pt doing well  Plan:  1. Iron deficiency anemia, unspecified iron deficiency anemia type Repeat cbc today to still see if needs to be on iron  - CBC  2. Uterine leiomyoma, unspecified location Continue on depo provera. Pt told can stay on this until menopause age 72-51. Recommend supplemental vitamin d and calcium   3. Menorrhagia, premenopausal  4. Language barrier Interpreter used   RTC: 14m for  depo provera shots  Aletha Halim, Brooke Bonito MD Attending Center for [employer] Fish farm manager)

## 2019-01-01 LAB — CBC
Hematocrit: 31 % — ABNORMAL LOW (ref 34.0–46.6)
Hemoglobin: 7.9 g/dL — ABNORMAL LOW (ref 11.1–15.9)
MCH: 16.4 pg — ABNORMAL LOW (ref 26.6–33.0)
MCHC: 25.5 g/dL — ABNORMAL LOW (ref 31.5–35.7)
MCV: 64 fL — ABNORMAL LOW (ref 79–97)
Platelets: 606 10*3/uL — ABNORMAL HIGH (ref 150–450)
RBC: 4.81 x10E6/uL (ref 3.77–5.28)
RDW: 19.1 % — ABNORMAL HIGH (ref 11.7–15.4)
WBC: 6.1 10*3/uL (ref 3.4–10.8)

## 2019-01-04 ENCOUNTER — Telehealth: Payer: Self-pay | Admitting: *Deleted

## 2019-01-04 NOTE — Telephone Encounter (Signed)
-----   Message from Aletha Halim, MD sent at 01/02/2019 12:15 PM EDT ----- Her blood counts are still low. Can you call her and confirm what dose of iron she is on and what she's taking? If she's taking it bid or tid, tell her that we recommend she get an IV iron transfusion. If she's not taking any or only qday, tell her to increase to tid, take daily miralax and have her come in for a lab only repeat visit in 6-8 weeks. thanks

## 2019-01-04 NOTE — Telephone Encounter (Signed)
I called Mariah Lang with [employer] and informed her per Dr. Ilda Basset her blood count is low- 7.9 and wants to know if she is taking iron and how often. She verified she is taking iron 325mg  daily. States it hurts her stomach if she takes more. I informed her to take TID with meals after she eats and if possible with orange juice to increase effectiveness of iron. I explained taking with food hopefully will decrease problems. I also explained she should take miralax daily to prevent constipation caused by iron. I also explained Dr. Ilda Basset wants her to get another CBC in 6-8 weeks and registrars will call her with lab only appt. She voices understanding. Linda,RN

## 2019-02-12 ENCOUNTER — Other Ambulatory Visit: Payer: Self-pay | Admitting: Lactation Services

## 2019-02-12 DIAGNOSIS — D509 Iron deficiency anemia, unspecified: Secondary | ICD-10-CM

## 2019-02-15 ENCOUNTER — Other Ambulatory Visit: Payer: Self-pay

## 2019-02-15 DIAGNOSIS — D509 Iron deficiency anemia, unspecified: Secondary | ICD-10-CM

## 2019-02-16 LAB — CBC
Hematocrit: 37.3 % (ref 34.0–46.6)
Hemoglobin: 10.2 g/dL — ABNORMAL LOW (ref 11.1–15.9)
MCH: 18.6 pg — ABNORMAL LOW (ref 26.6–33.0)
MCHC: 27.3 g/dL — ABNORMAL LOW (ref 31.5–35.7)
MCV: 68 fL — ABNORMAL LOW (ref 79–97)
Platelets: 587 10*3/uL — ABNORMAL HIGH (ref 150–450)
RBC: 5.48 x10E6/uL — ABNORMAL HIGH (ref 3.77–5.28)
RDW: 22.9 % — ABNORMAL HIGH (ref 11.7–15.4)
WBC: 6.9 10*3/uL (ref 3.4–10.8)

## 2019-03-04 ENCOUNTER — Ambulatory Visit (INDEPENDENT_AMBULATORY_CARE_PROVIDER_SITE_OTHER): Payer: Self-pay

## 2019-03-04 ENCOUNTER — Other Ambulatory Visit: Payer: Self-pay

## 2019-03-04 VITALS — BP 127/85 | HR 69 | Wt 185.3 lb

## 2019-03-04 DIAGNOSIS — Z3042 Encounter for surveillance of injectable contraceptive: Secondary | ICD-10-CM

## 2019-03-04 MED ORDER — MEDROXYPROGESTERONE ACETATE 150 MG/ML IM SUSP
150.0000 mg | Freq: Once | INTRAMUSCULAR | Status: AC
  Administered 2019-03-04: 12:00:00 150 mg via INTRAMUSCULAR

## 2019-03-04 NOTE — Progress Notes (Signed)
[place] here for Depo-Provera  Injection.  Injection administered without complication. Patient will return in 3 months for next injection.  Verdell Carmine, RN 03/04/2019  9:46 AM

## 2019-03-04 NOTE — Progress Notes (Signed)
ATTESTATION OF SUPERVISION OF RN: Evaluation and management procedures were performed by the RN under my supervision and collaboration. I have reviewed the nursing note and chart and agree with the management and plan for this patient.  Samantha Weinhold, CNM  

## 2019-04-04 ENCOUNTER — Encounter

## 2019-04-12 ENCOUNTER — Other Ambulatory Visit: Payer: Self-pay

## 2019-04-12 ENCOUNTER — Ambulatory Visit: Payer: Self-pay | Admitting: Obstetrics and Gynecology

## 2019-05-09 ENCOUNTER — Other Ambulatory Visit: Payer: Self-pay | Admitting: *Deleted

## 2019-05-09 DIAGNOSIS — D508 Other iron deficiency anemias: Secondary | ICD-10-CM

## 2019-05-10 ENCOUNTER — Other Ambulatory Visit: Payer: Self-pay

## 2019-05-10 DIAGNOSIS — D508 Other iron deficiency anemias: Secondary | ICD-10-CM

## 2019-05-10 LAB — CBC
Hematocrit: 39.3 % (ref 34.0–46.6)
Hemoglobin: 12.2 g/dL (ref 11.1–15.9)
MCH: 23.1 pg — ABNORMAL LOW (ref 26.6–33.0)
MCHC: 31 g/dL — ABNORMAL LOW (ref 31.5–35.7)
MCV: 75 fL — ABNORMAL LOW (ref 79–97)
Platelets: 469 10*3/uL — ABNORMAL HIGH (ref 150–450)
RBC: 5.27 x10E6/uL (ref 3.77–5.28)
RDW: 19 % — ABNORMAL HIGH (ref 11.7–15.4)
WBC: 8.4 10*3/uL (ref 3.4–10.8)

## 2019-05-11 ENCOUNTER — Other Ambulatory Visit: Payer: Self-pay | Admitting: Obstetrics and Gynecology

## 2019-05-11 DIAGNOSIS — D473 Essential (hemorrhagic) thrombocythemia: Secondary | ICD-10-CM

## 2019-05-11 DIAGNOSIS — D75839 Thrombocytosis, unspecified: Secondary | ICD-10-CM | POA: Insufficient documentation

## 2019-05-13 ENCOUNTER — Telehealth (INDEPENDENT_AMBULATORY_CARE_PROVIDER_SITE_OTHER): Payer: Self-pay | Admitting: Lactation Services

## 2019-05-13 ENCOUNTER — Telehealth: Payer: Self-pay | Admitting: Hematology

## 2019-05-13 DIAGNOSIS — D509 Iron deficiency anemia, unspecified: Secondary | ICD-10-CM

## 2019-05-13 NOTE — Telephone Encounter (Signed)
Appt scheduled with Hematology on 05/22/2019 at 1 pm with Dr. Irene Limbo at the [facility].   Called pt with assistance of [employer], Roman # [phone] to inform pt to stop Fe and about her Hematology appt that is scheduled. Pt did not answer. Message left for pt to call the office for appointment and results.

## 2019-05-13 NOTE — Telephone Encounter (Signed)
Received a new hem referral from [place] for thrombocytosis. Ms. Orlene Och has been scheduled to see Dr. Irene Limbo on 2.17 at [place] from the referring office will notify the pt.

## 2019-05-13 NOTE — Telephone Encounter (Signed)
-----   Message from Aletha Halim, MD sent at 05/11/2019  4:29 PM EST ----- Can you let her know that her H/H is fine so she can stop the iron pills but that she does have somewhat high platelets. Can you work on referring her to hematology for this and let her know? Order is in. thanks

## 2019-05-14 NOTE — Telephone Encounter (Addendum)
Called pt with assistance of [employer], Administrator, sports. Pt was informed that her Hemoglobin is ok and she can stop her Fe. Pt was informed that they would like for her to see a Hematologist. Pt was informed of her appt day, time and location. Pt aware she has an appt in the office on 2/15 for Depo.

## 2019-05-18 ENCOUNTER — Telehealth: Payer: Self-pay

## 2019-05-18 NOTE — Telephone Encounter (Signed)
Pt call transferred from the front office.  With Spanish interpreter [place]lette Pa, pt is anxious that the Depo Provera is causing her to have clots.  I explained to the pt that sometimes when you are on Depo Provera you don't have a period for months and then your period will start may have some clots.  Pt begins to say that the interpreter that she last spoke with informed her that she had clots in her vein.  I reviewed pt's chart to see who she may have spoken to because pt was unable to tell me who and where she was informed.  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   I explained to the pt that it should not have anything to do with her taking the Depo Provera to please come to her appt scheduled on 05/20/19 for Depo and we will be able to answer questions that she may have.  Pt verbalized understanding.   Mel Almond, RN

## 2019-05-20 ENCOUNTER — Ambulatory Visit (INDEPENDENT_AMBULATORY_CARE_PROVIDER_SITE_OTHER): Payer: Self-pay

## 2019-05-20 ENCOUNTER — Other Ambulatory Visit: Payer: Self-pay

## 2019-05-20 VITALS — BP 127/83 | HR 76 | Wt 191.4 lb

## 2019-05-20 DIAGNOSIS — Z3042 Encounter for surveillance of injectable contraceptive: Secondary | ICD-10-CM

## 2019-05-20 MED ORDER — MEDROXYPROGESTERONE ACETATE 150 MG/ML IM SUSP
150.0000 mg | Freq: Once | INTRAMUSCULAR | Status: AC
  Administered 2019-05-20: 10:00:00 150 mg via INTRAMUSCULAR

## 2019-05-20 NOTE — Progress Notes (Signed)
[name] here for Depo-Provera  Injection.  Injection administered without complication. Patient will return in 3 months for next injection.  With Mariah Lang pt states that she did not know what her results are and why the last interpreter she spoke to informed that she has clots in her vein.  I explained to the pt that thrombocytosis can form a clot in the vein however we are saying that she has a clot in her vein.  I advised pt that is the reason for the referral to hematology so that can better explain what her results mean.  I also informed pt that I would place in patient instructions information on thrombocytosis so that she can have a little information.  Pt verbalized understanding.   Verdell Carmine, RN 05/20/2019  9:25 AM

## 2019-05-20 NOTE — Progress Notes (Signed)
Patient seen and assessed by nursing staff.  Agree with documentation and plan.  

## 2019-05-20 NOTE — Patient Instructions (Signed)
Trombocitosis reactiva Reactive Thrombocytosis La trombocitosis reactiva se produce al tener demasiadas plaquetas (trombocitos) en la sangre. Las plaquetas son diminutos elementos de la sangre que se unen y forman un cogulo (trombo). Las [employer] a que el cuerpo detenga una hemorragia. Las afecciones que causan inflamacin, como el cncer, pueden hacer que el cuerpo produzca ms plaquetas de lo normal. Esta afeccin tambin se conoce como trombocitosis secundaria. Cules son las causas? Esta afeccin puede ser causada por lo siguiente:  Extirpacin [place] del bazo (esplenectoma).  Lesiones.  Ciertas infecciones.  Sangrado intenso.  Recuento bajo de glbulos rojos por falta de hierro (anemia por deficiencia de hierro).  Presencia de una enfermedad que destruye los glbulos rojos (anemia hemoltica).  Falta de vitamina B12.  Enfermedad inflamatoria del intestino (enfermedad de Crohn o colitis ulcerosa).  Cncer, en especial, linfoma o cncer de mama, estmago u ovario.  Consumo excesivo de alcohol.  Ciertos medicamentos. Cules son los signos o los sntomas? Puede ser difcil diferenciar entre los sntomas de la trombocitosis reactiva y los sntomas de la afeccin preexistente. Los sntomas de esta afeccin pueden incluir los siguientes:  Debilidad.  Dolor de [place].  Mareos o confusin.  Dolor en el pecho o falta de aire.  Quemazn u hormigueo en las manos o los pies. Cmo se diagnostica? Esta afeccin puede diagnosticarse con anlisis de sangre de rutina o durante la evaluacin a causa de otro trastorno. Es posible que tambin necesite estudios para Firefighter el diagnstico. Estos medicamentos pueden incluir:  Anlisis de Production designer, theatre/television/film.  Un procedimiento para extraer Truddie Coco de mdula sea (aspiracin de mdula sea). Cmo se trata? El tratamiento de esta afeccin depende de la causa.  El recuento de plaquetas se normalizar despus de tratar  la causa preexistente.  Si el recuento de plaquetas es muy alto, quizs deba tomar medicamentos para evitar la formacin de cogulos, segn las indicaciones del mdico. Siga estas instrucciones en su casa:   Tome los medicamentos de venta libre y los recetados solamente como se lo haya indicado el mdico.  Colabore con su mdico con respecto a lo siguiente: ? Librarian, academic afeccin que causa la trombocitosis reactiva. ? Controlar cualquier otra afeccin que tenga, como hipertensin arterial, colesterol alto y diabetes.  No consuma ningn producto que contenga nicotina o tabaco, como cigarrillos, cigarrillos electrnicos y tabaco de Higher education careers adviser. Si necesita ayuda para dejar de fumar, consulte al mdico.  Concurra a todas las visitas de seguimiento como se lo haya indicado el mdico. Esto es importante. Comunquese con un mdico si:  Tiene un dolor de cabeza que no IT consultant.  Se desmaya. Solicite ayuda inmediatamente si:  Tiene dolor de [place], debilidad o parlisis facial de forma repentina.  No puede hablar o comprender el lenguaje de forma repentina.  Siente dolor en el pecho.  Tiene dificultad para respirar. Resumen  La trombocitosis reactiva se produce al tener demasiadas plaquetas (trombocitos) en la sangre.  Las [employer] a que el cuerpo detenga una hemorragia.  Las afecciones que causan inflamacin pueden hacer que el cuerpo produzca ms plaquetas de lo normal.  El tratamiento de esta afeccin depende de la causa. Esta informacin no tiene Marine scientist el consejo del mdico. Asegrese de hacerle al mdico cualquier pregunta que tenga. Document Revised: 02/05/2018 Document Reviewed: 02/05/2018 Elsevier Patient Education  [place].

## 2019-05-22 ENCOUNTER — Inpatient Hospital Stay: Payer: Self-pay | Attending: Hematology | Admitting: Hematology

## 2019-05-22 ENCOUNTER — Encounter: Payer: Self-pay | Admitting: Hematology

## 2019-05-22 ENCOUNTER — Other Ambulatory Visit: Payer: Self-pay

## 2019-05-22 ENCOUNTER — Inpatient Hospital Stay: Payer: Self-pay

## 2019-05-22 VITALS — BP 144/81 | HR 80 | Temp 98.7°F | Resp 18 | Ht 60.24 in | Wt 193.2 lb

## 2019-05-22 DIAGNOSIS — M898X3 Other specified disorders of bone, forearm: Secondary | ICD-10-CM | POA: Insufficient documentation

## 2019-05-22 DIAGNOSIS — D500 Iron deficiency anemia secondary to blood loss (chronic): Secondary | ICD-10-CM

## 2019-05-22 DIAGNOSIS — D75839 Thrombocytosis, unspecified: Secondary | ICD-10-CM

## 2019-05-22 DIAGNOSIS — D473 Essential (hemorrhagic) thrombocythemia: Secondary | ICD-10-CM | POA: Insufficient documentation

## 2019-05-22 DIAGNOSIS — N920 Excessive and frequent menstruation with regular cycle: Secondary | ICD-10-CM | POA: Insufficient documentation

## 2019-05-22 DIAGNOSIS — M25559 Pain in unspecified hip: Secondary | ICD-10-CM | POA: Insufficient documentation

## 2019-05-22 DIAGNOSIS — E611 Iron deficiency: Secondary | ICD-10-CM | POA: Insufficient documentation

## 2019-05-22 LAB — CBC WITH DIFFERENTIAL/PLATELET
Abs Immature Granulocytes: 0.03 10*3/uL (ref 0.00–0.07)
Basophils Absolute: 0.1 10*3/uL (ref 0.0–0.1)
Basophils Relative: 1 %
Eosinophils Absolute: 0.1 10*3/uL (ref 0.0–0.5)
Eosinophils Relative: 1 %
HCT: 41.8 % (ref 36.0–46.0)
Hemoglobin: 12.8 g/dL (ref 12.0–15.0)
Immature Granulocytes: 0 %
Lymphocytes Relative: 30 %
Lymphs Abs: 3.3 10*3/uL (ref 0.7–4.0)
MCH: 22.7 pg — ABNORMAL LOW (ref 26.0–34.0)
MCHC: 30.6 g/dL (ref 30.0–36.0)
MCV: 74.1 fL — ABNORMAL LOW (ref 80.0–100.0)
Monocytes Absolute: 0.9 10*3/uL (ref 0.1–1.0)
Monocytes Relative: 9 %
Neutro Abs: 6.3 10*3/uL (ref 1.7–7.7)
Neutrophils Relative %: 59 %
Platelets: 467 10*3/uL — ABNORMAL HIGH (ref 150–400)
RBC: 5.64 MIL/uL — ABNORMAL HIGH (ref 3.87–5.11)
RDW: 18.6 % — ABNORMAL HIGH (ref 11.5–15.5)
WBC: 10.7 10*3/uL — ABNORMAL HIGH (ref 4.0–10.5)
nRBC: 0 % (ref 0.0–0.2)

## 2019-05-22 LAB — CMP (CANCER CENTER ONLY)
ALT: 13 U/L (ref 0–44)
AST: 16 U/L (ref 15–41)
Albumin: 3.9 g/dL (ref 3.5–5.0)
Alkaline Phosphatase: 57 U/L (ref 38–126)
Anion gap: 7 (ref 5–15)
BUN: 10 mg/dL (ref 6–20)
CO2: 23 mmol/L (ref 22–32)
Calcium: 9 mg/dL (ref 8.9–10.3)
Chloride: 109 mmol/L (ref 98–111)
Creatinine: 0.63 mg/dL (ref 0.44–1.00)
GFR, Est AFR Am: >60 mL/min (ref >60)
GFR, Estimated: >60 mL/min (ref >60)
Glucose, Bld: 80 mg/dL (ref 70–99)
Potassium: 4 mmol/L (ref 3.5–5.1)
Sodium: 139 mmol/L (ref 135–145)
Total Bilirubin: 0.3 mg/dL (ref 0.3–1.2)
Total Protein: 7.8 g/dL (ref 6.5–8.1)

## 2019-05-22 LAB — VITAMIN B12: Vitamin B-12: 321 pg/mL (ref 180–914)

## 2019-05-22 LAB — IRON AND TIBC
Iron: 29 ug/dL — ABNORMAL LOW (ref 41–142)
Saturation Ratios: 8 % — ABNORMAL LOW (ref 21–57)
TIBC: 356 ug/dL (ref 236–444)
UIBC: 327 ug/dL (ref 120–384)

## 2019-05-22 LAB — FERRITIN: Ferritin: 4 ng/mL — ABNORMAL LOW (ref 11–307)

## 2019-05-22 MED ORDER — POLYSACCHARIDE IRON COMPLEX 150 MG PO CAPS: 150.0000 mg | ORAL_CAPSULE | Freq: Two times a day (BID) | ORAL | 4 refills | Status: DC

## 2019-05-22 NOTE — Progress Notes (Signed)
HEMATOLOGY/ONCOLOGY CONSULTATION NOTE  Date of Service: 05/22/2019  Patient Care Team: Patient, No Pcp Per as PCP - General (General Practice) Tresa Garter, MD (Internal Medicine)  CHIEF COMPLAINTS/PURPOSE OF CONSULTATION:  Thrombocytosis  HISTORY OF PRESENTING ILLNESS:   Mariah Lang is a wonderful 49 y.o. female who has been referred to [place] by Dr Aletha Halim for evaluation and management of thrombocytosis. Pt is accompanied today by Claretta Fraise translator. The pt reports that she is doing well overall.   The pt reports that she was first told of her elevated PLT after the last labs that she had with her OBGYN. Pt had menorrhagia but started using Depo-Provera 9 months ago and her vaginal bleeding has stopped. She was previously using 4 heavy menstrual pads during the day and more during the night. She was found to have a uterine fibroids. Pt has been on PO Ferrous Sulfate three times day at the instruction of her OBGYN, Dr. Ilda Basset. It was causing her abdominal pain and constipation. She was told that she could stop taking PO Iron on 02/05.   Pt has been otherwise very healthy. She has had an Appendectomy and two C-sections. Both of her children are in their 20's. She does not smoke cigarettes and only drinks alcohol socially. Pt has been prescribed Vitamin D, but is not taking it. She is not currently using a multivitamin or a B-complex vitamin. Over the last year she has been feeling a lot of pain in both of her forearms. She is having a burning sensation down to the bone. She does a fair amount of mopping and lifting with her forearms. Pt has hip pain when she is sitting down.   Most recent lab results (05/10/2019) of CBC is as follows: all values are WNL except for MCV at 75K, MCH at 23.1, MCHC at 31.0, RDW at 19.0, PLT at 469K.  On review of systems, pt reports hip pain, forearm pain/burning and denies other bone pain and any other symptoms.   On  PMHx the pt reports Menorrhagia, Appendectomy, C-section x2, Uterine Fibroids. On Social Hx the pt reports she is a non-smoker and drinks alcohol socially  MEDICAL HISTORY:  Past Medical History:  Diagnosis Date  . Anemia     SURGICAL HISTORY: Past Surgical History:  Procedure Laterality Date  . APPENDECTOMY    . CESAREAN SECTION  2000  . OTHER SURGICAL HISTORY  1993   c-section    SOCIAL HISTORY: Social History   Socioeconomic History  . Marital status: Single    Spouse name: Not on file  . Number of children: 2  . Years of education: Not on file  . Highest education level: 9th grade  Occupational History  . Not on file  Tobacco Use  . Smoking status: Never Smoker  . Smokeless tobacco: Never Used  Substance and Sexual Activity  . Alcohol use: No  . Drug use: No  . Sexual activity: Yes    Birth control/protection: None, Injection  Other Topics Concern  . Not on file  Social History Narrative   ** Merged History Encounter **       Social Determinants of Health   Financial Resource Strain:   . Difficulty of Paying Living Expenses: Not on file  Food Insecurity:   . Worried About Charity fundraiser in the Last Year: Not on file  . Ran Out of Food in the Last Year: Not on file  Transportation Needs: No Transportation Needs  .  Lack of Transportation (Medical): No  . Lack of Transportation (Non-Medical): No  Physical Activity:   . Days of Exercise per Week: Not on file  . Minutes of Exercise per Session: Not on file  Stress:   . Feeling of Stress : Not on file  Social Connections:   . Frequency of Communication with Friends and Family: Not on file  . Frequency of Social Gatherings with Friends and Family: Not on file  . Attends Religious Services: Not on file  . Active Member of Clubs or Organizations: Not on file  . Attends Archivist Meetings: Not on file  . Marital Status: Not on file  Intimate Partner Violence:   . Fear of Current or  Ex-Partner: Not on file  . Emotionally Abused: Not on file  . Physically Abused: Not on file  . Sexually Abused: Not on file    FAMILY HISTORY: Family History  Problem Relation Age of Onset  . Diabetes Mother   . Hypertension Mother   . Diabetes Paternal Grandmother     ALLERGIES:  has No Known Allergies.  MEDICATIONS:  Current Outpatient Medications  Medication Sig Dispense Refill  . Calcium Carbonate-Vit D-Min (CALCIUM 1200) 1200-1000 MG-UNIT CHEW Chew 1 tablet by mouth daily. 30 tablet   . cholecalciferol (VITAMIN D3) 25 MCG (1000 UT) tablet Take 1 tablet (1,000 Units total) by mouth daily.    . ferrous sulfate 325 (65 FE) MG tablet Take 1 tablet (325 mg total) by mouth 3 (three) times daily with meals. 270 tablet 11  . iron polysaccharides (NIFEREX) 150 MG capsule Take 1 capsule (150 mg total) by mouth 2 (two) times daily. 60 capsule 4   Current Facility-Administered Medications  Medication Dose Route Frequency Provider Last Rate Last Admin  . tranexamic acid (LYSTEDA) tablet 1,300 mg  1,300 mg Oral TID Aletha Halim, MD        REVIEW OF SYSTEMS:    10 Point review of Systems was done is negative except as noted above.  PHYSICAL EXAMINATION: ECOG PERFORMANCE STATUS: 1 - Symptomatic but completely ambulatory  . Vitals:   05/22/19 1258  BP: (!) 144/81  Pulse: 80  Resp: 18  Temp: 98.7 F (37.1 C)  SpO2: 100%   Filed Weights   05/22/19 1258  Weight: 193 lb 3.2 oz (87.6 kg)   .Body mass index is 37.43 kg/m.  GENERAL:alert, in no acute distress and comfortable SKIN: no acute rashes, no significant lesions EYES: conjunctiva are pink and non-injected, sclera anicteric OROPHARYNX: MMM, no exudates, no oropharyngeal erythema or ulceration NECK: supple, no JVD LYMPH:  no palpable lymphadenopathy in the cervical, axillary or inguinal regions LUNGS: clear to auscultation b/l with normal respiratory effort HEART: regular rate & rhythm ABDOMEN:  normoactive bowel  sounds , non tender, not distended. Extremity: no pedal edema PSYCH: alert & oriented x 3 with fluent speech NEURO: no focal motor/sensory deficits  LABORATORY DATA:  I have reviewed the data as listed  . CBC Latest Ref Rng & Units 05/22/2019 05/10/2019 02/15/2019  WBC 4.0 - 10.5 K/uL 10.7(H) 8.4 6.9  Hemoglobin 12.0 - 15.0 g/dL 12.8 12.2 10.2(L)  Hematocrit 36.0 - 46.0 % 41.8 39.3 37.3  Platelets 150 - 400 K/uL 467(H) 469(H) 587(H)    . CMP Latest Ref Rng & Units 05/22/2019 06/11/2018 05/20/2018  Glucose 70 - 99 mg/dL 80 85 93  BUN 6 - 20 mg/dL 10 14 13   Creatinine 0.44 - 1.00 mg/dL 0.63 0.48(L) 0.43(L)  Sodium 135 -  145 mmol/L 139 139 135  Potassium 3.5 - 5.1 mmol/L 4.0 4.4 3.9  Chloride 98 - 111 mmol/L 109 103 106  CO2 22 - 32 mmol/L 23 22 19(L)  Calcium 8.9 - 10.3 mg/dL 9.0 9.1 8.9  Total Protein 6.5 - 8.1 g/dL 7.8 7.0 -  Total Bilirubin 0.3 - 1.2 mg/dL 0.3 0.2 -  Alkaline Phos 38 - 126 U/L 57 45 -  AST 15 - 41 U/L 16 21 -  ALT 0 - 44 U/L 13 9 -     RADIOGRAPHIC STUDIES: I have personally reviewed the radiological images as listed and agreed with the findings in the report. No results found.  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 Likely due to iron deficiency. PLAN: -Discussed patient's most recent labs from 05/10/2019, all values are WNL except for MCV at 75K, MCH at 23.1, MCHC at 31.0, RDW at 19.0, PLT at 469K. -Advised pt that thrombocytopenia could be caused by a reactive process or a primary bone marrow problem  -Advised pt that a primary bone marrow problem is unlikely due to pt's age and stable PLT counts  -Advised pt that severe iron deficiency and heavy bleeding are likely causes of thrombocytosis -Advised pt that her last Ferritin levels were nearly undetectable a year ago - would like to recheck -Advised pt that with less blood loss and well-replaced Iron it is likely that thrombocytosis will correct -Advised pt that  Depo-Provera and other birth controls can increase the risk of blood clots -Recommend pt stay well hydrated and active -Recommend pt continue PO Iron. Pt advised to contact if she continues having difficulty tolerating  -Pt would prefer to discuss results with phone interpreter -  Contact Lavon Paganini at [phone]  -Will get labs today - would like to r/o genetic factors -Rx Iron Polysaccharide  -Will see back in 3 months with labs   FOLLOW UP: Labs today RTC with Dr Irene Limbo in 3 months with labs   All of the patients questions were answered with apparent satisfaction. The patient knows to call the clinic with any problems, questions or concerns.  I spent 25 mins counseling the patient face to face. The total time spent in the appointment was 30 minutes and more than 50% was on counseling and direct patient cares.    Sullivan Lone MD [place] AAHIVMS [facility] Hematology/Oncology Physician [facility]  (Office):       [phone] (Work cell):  [phone] (Fax):           [phone]  05/22/2019 4:28 PM  I, Yevette Edwards, am acting as a scribe for Dr. Sullivan Lone.   .I have reviewed the above documentation for accuracy and completeness, and I agree with the above. Brunetta Genera MD

## 2019-05-27 ENCOUNTER — Telehealth: Payer: Self-pay | Admitting: Hematology

## 2019-05-27 NOTE — Telephone Encounter (Signed)
Scheduled per 02/18 los, called patient and left a voicemail.

## 2019-06-18 LAB — JAK2 (INCLUDING V617F AND EXON 12), MPL,& CALR-NEXT GEN SEQ

## 2019-08-05 ENCOUNTER — Ambulatory Visit: Payer: No Typology Code available for payment source

## 2019-08-19 ENCOUNTER — Telehealth: Payer: Self-pay | Admitting: Hematology

## 2019-08-19 ENCOUNTER — Inpatient Hospital Stay: Payer: No Typology Code available for payment source

## 2019-08-19 ENCOUNTER — Inpatient Hospital Stay: Payer: No Typology Code available for payment source | Admitting: Hematology

## 2019-08-19 NOTE — Telephone Encounter (Signed)
Scheduled appt per 5/17 sch message -  interp julie aware of appts and will let pt know

## 2019-09-16 ENCOUNTER — Inpatient Hospital Stay (HOSPITAL_BASED_OUTPATIENT_CLINIC_OR_DEPARTMENT_OTHER): Payer: Self-pay | Admitting: Hematology

## 2019-09-16 ENCOUNTER — Inpatient Hospital Stay: Payer: Self-pay | Attending: Hematology

## 2019-09-16 ENCOUNTER — Encounter (INDEPENDENT_AMBULATORY_CARE_PROVIDER_SITE_OTHER): Payer: Self-pay

## 2019-09-16 ENCOUNTER — Other Ambulatory Visit: Payer: Self-pay

## 2019-09-16 VITALS — BP 140/83 | HR 77 | Temp 97.7°F | Resp 17 | Ht 60.12 in | Wt 192.2 lb

## 2019-09-16 DIAGNOSIS — D500 Iron deficiency anemia secondary to blood loss (chronic): Secondary | ICD-10-CM

## 2019-09-16 DIAGNOSIS — D473 Essential (hemorrhagic) thrombocythemia: Secondary | ICD-10-CM | POA: Insufficient documentation

## 2019-09-16 DIAGNOSIS — D75839 Thrombocytosis, unspecified: Secondary | ICD-10-CM

## 2019-09-16 LAB — CBC WITH DIFFERENTIAL/PLATELET
Abs Immature Granulocytes: 0.04 10*3/uL (ref 0.00–0.07)
Basophils Absolute: 0 10*3/uL (ref 0.0–0.1)
Basophils Relative: 1 %
Eosinophils Absolute: 0.2 10*3/uL (ref 0.0–0.5)
Eosinophils Relative: 2 %
HCT: 44.1 % (ref 36.0–46.0)
Hemoglobin: 14.1 g/dL (ref 12.0–15.0)
Immature Granulocytes: 1 %
Lymphocytes Relative: 30 %
Lymphs Abs: 2.6 10*3/uL (ref 0.7–4.0)
MCH: 26.3 pg (ref 26.0–34.0)
MCHC: 32 g/dL (ref 30.0–36.0)
MCV: 82.1 fL (ref 80.0–100.0)
Monocytes Absolute: 0.7 10*3/uL (ref 0.1–1.0)
Monocytes Relative: 8 %
Neutro Abs: 5 10*3/uL (ref 1.7–7.7)
Neutrophils Relative %: 58 %
Platelets: 396 10*3/uL (ref 150–400)
RBC: 5.37 MIL/uL — ABNORMAL HIGH (ref 3.87–5.11)
RDW: 15.2 % (ref 11.5–15.5)
WBC: 8.5 10*3/uL (ref 4.0–10.5)
nRBC: 0 % (ref 0.0–0.2)

## 2019-09-16 LAB — FERRITIN: Ferritin: 8 ng/mL — ABNORMAL LOW (ref 11–307)

## 2019-09-16 MED ORDER — POLYSACCHARIDE IRON COMPLEX 150 MG PO CAPS: 150.0000 mg | ORAL_CAPSULE | Freq: Two times a day (BID) | ORAL | 5 refills | Status: DC

## 2019-09-16 NOTE — Progress Notes (Signed)
HEMATOLOGY/ONCOLOGY CONSULTATION NOTE  Date of Service: 09/16/2019  Patient Care Team: Patient, No Pcp Per as PCP - General (General Practice) Tresa Garter, MD (Internal Medicine)  CHIEF COMPLAINTS/PURPOSE OF CONSULTATION:  Thrombocytosis Anemia  HISTORY OF PRESENTING ILLNESS:   Mariah Lang is a wonderful 49 y.o. female who has been referred to [place] by Dr Aletha Halim for evaluation and management of thrombocytosis. Pt is accompanied today by Claretta Fraise translator. The pt reports that she is doing well overall.   The pt reports that she was first told of her elevated PLT after the last labs that she had with her OBGYN. Pt had menorrhagia but started using Depo-Provera 9 months ago and her vaginal bleeding has stopped. She was previously using 4 heavy menstrual pads during the day and more during the night. She was found to have a uterine fibroids. Pt has been on PO Ferrous Sulfate three times day at the instruction of her OBGYN, Dr. Ilda Basset. It was causing her abdominal pain and constipation. She was told that she could stop taking PO Iron on 02/05.   Pt has been otherwise very healthy. She has had an Appendectomy and two C-sections. Both of her children are in their 20's. She does not smoke cigarettes and only drinks alcohol socially. Pt has been prescribed Vitamin D, but is not taking it. She is not currently using a multivitamin or a B-complex vitamin. Over the last year she has been feeling a lot of pain in both of her forearms. She is having a burning sensation down to the bone. She does a fair amount of mopping and lifting with her forearms. Pt has hip pain when she is sitting down.   Most recent lab results (05/10/2019) of CBC is as follows: all values are WNL except for MCV at 75K, MCH at 23.1, MCHC at 31.0, RDW at 19.0, PLT at 469K.  On review of systems, pt reports hip pain, forearm pain/burning and denies other bone pain and any other symptoms.     On PMHx the pt reports Menorrhagia, Appendectomy, C-section x2, Uterine Fibroids. On Social Hx the pt reports she is a non-smoker and drinks alcohol socially  INTERVAL HISTORY:  Mariah Lang is a wonderful 49 y.o. female who is here for evaluation and management of thrombocytosis. Pt is accompanied today by a Patent attorney. The patient's last visit with [place] was on 05/22/2019. The pt reports that she is doing well overall.  The pt reports that she has been well, but continues to feel cold. She has been taking PO Iron as recommended. She is not currently experiencing a menstrual cycle and is being given the Depo Provera by her OBGYN.   Of note since the patient's last visit, pt has had JAK2, MPL, & CALR mutation studies completed on 05/22/2019 with results revealing "No Mutations Identified".  Lab results today (09/16/19) of CBC w/diff is as follows: all values are WNL except for RBC at 5.37. 09/16/2019 Ferritin at 8  On review of systems, pt reports chills and denies abdominal pain, constipation, diarrhea and any other symptoms.   MEDICAL HISTORY:  Past Medical History:  Diagnosis Date  . Anemia     SURGICAL HISTORY: Past Surgical History:  Procedure Laterality Date  . APPENDECTOMY    . CESAREAN SECTION  2000  . OTHER SURGICAL HISTORY  1993   c-section    SOCIAL HISTORY: Social History   Socioeconomic History  . Marital status: Single  Spouse name: Not on file  . Number of children: 2  . Years of education: Not on file  . Highest education level: 9th grade  Occupational History  . Not on file  Tobacco Use  . Smoking status: Never Smoker  . Smokeless tobacco: Never Used  Vaping Use  . Vaping Use: Never used  Substance and Sexual Activity  . Alcohol use: No  . Drug use: No  . Sexual activity: Yes    Birth control/protection: None, Injection  Other Topics Concern  . Not on file  Social History Narrative   ** Merged History Encounter **        Social Determinants of Health   Financial Resource Strain:   . Difficulty of Paying Living Expenses:   Food Insecurity:   . Worried About Charity fundraiser in the Last Year:   . Arboriculturist in the Last Year:   Transportation Needs: No Transportation Needs  . Lack of Transportation (Medical): No  . Lack of Transportation (Non-Medical): No  Physical Activity:   . Days of Exercise per Week:   . Minutes of Exercise per Session:   Stress:   . Feeling of Stress :   Social Connections:   . Frequency of Communication with Friends and Family:   . Frequency of Social Gatherings with Friends and Family:   . Attends Religious Services:   . Active Member of Clubs or Organizations:   . Attends Archivist Meetings:   Marland Kitchen Marital Status:   Intimate Partner Violence:   . Fear of Current or Ex-Partner:   . Emotionally Abused:   Marland Kitchen Physically Abused:   . Sexually Abused:     FAMILY HISTORY: Family History  Problem Relation Age of Onset  . Diabetes Mother   . Hypertension Mother   . Diabetes Paternal Grandmother     ALLERGIES:  has No Known Allergies.  MEDICATIONS:  Current Outpatient Medications  Medication Sig Dispense Refill  . Calcium Carbonate-Vit D-Min (CALCIUM 1200) 1200-1000 MG-UNIT CHEW Chew 1 tablet by mouth daily. 30 tablet   . cholecalciferol (VITAMIN D3) 25 MCG (1000 UT) tablet Take 1 tablet (1,000 Units total) by mouth daily.    . iron polysaccharides (NIFEREX) 150 MG capsule Take 1 capsule (150 mg total) by mouth 2 (two) times daily. 60 capsule 5   Current Facility-Administered Medications  Medication Dose Route Frequency Provider Last Rate Last Admin  . tranexamic acid (LYSTEDA) tablet 1,300 mg  1,300 mg Oral TID Aletha Halim, MD        REVIEW OF SYSTEMS:   A 10+ POINT REVIEW OF SYSTEMS WAS OBTAINED including neurology, dermatology, psychiatry, cardiac, respiratory, lymph, extremities, GI, GU, Musculoskeletal, constitutional, breasts,  reproductive, HEENT.  All pertinent positives are noted in the HPI.  All others are negative.   PHYSICAL EXAMINATION: ECOG PERFORMANCE STATUS: 1 - Symptomatic but completely ambulatory  . Vitals:   09/16/19 0931  BP: 140/83  Pulse: 77  Resp: 17  Temp: 97.7 F (36.5 C)  SpO2: 100%   Filed Weights   09/16/19 0931  Weight: 192 lb 3.2 oz (87.2 kg)   .Body mass index is 37.38 kg/m.   GENERAL:alert, in no acute distress and comfortable SKIN: no acute rashes, no significant lesions EYES: conjunctiva are pink and non-injected, sclera anicteric OROPHARYNX: MMM, no exudates, no oropharyngeal erythema or ulceration NECK: supple, no JVD LYMPH:  no palpable lymphadenopathy in the cervical, axillary or inguinal regions LUNGS: clear to auscultation b/l with normal  respiratory effort HEART: regular rate & rhythm ABDOMEN:  normoactive bowel sounds , non tender, not distended. No palpable hepatosplenomegaly.  Extremity: no pedal edema PSYCH: alert & oriented x 3 with fluent speech NEURO: no focal motor/sensory deficits  LABORATORY DATA:  I have reviewed the data as listed  . CBC Latest Ref Rng & Units 09/16/2019 05/22/2019 05/10/2019  WBC 4.0 - 10.5 K/uL 8.5 10.7(H) 8.4  Hemoglobin 12.0 - 15.0 g/dL 14.1 12.8 12.2  Hematocrit 36 - 46 % 44.1 41.8 39.3  Platelets 150 - 400 K/uL 396 467(H) 469(H)    . CMP Latest Ref Rng & Units 05/22/2019 06/11/2018 05/20/2018  Glucose 70 - 99 mg/dL 80 85 93  BUN 6 - 20 mg/dL 10 14 13   Creatinine 0.44 - 1.00 mg/dL 0.63 0.48(L) 0.43(L)  Sodium 135 - 145 mmol/L 139 139 135  Potassium 3.5 - 5.1 mmol/L 4.0 4.4 3.9  Chloride 98 - 111 mmol/L 109 103 106  CO2 22 - 32 mmol/L 23 22 19(L)  Calcium 8.9 - 10.3 mg/dL 9.0 9.1 8.9  Total Protein 6.5 - 8.1 g/dL 7.8 7.0 -  Total Bilirubin 0.3 - 1.2 mg/dL 0.3 0.2 -  Alkaline Phos 38 - 126 U/L 57 45 -  AST 15 - 41 U/L 16 21 -  ALT 0 - 44 U/L 13 9 -   05/22/2019 JAK2, MPL, CALR Panel Report:    RADIOGRAPHIC  STUDIES: I have personally reviewed the radiological images as listed and agreed with the findings in the report. No results found.  ASSESSMENT & PLAN:   49 yo with   1) Thrombocytosis Reactive . ET ruled out with negative mutation testing for clonal mutations. Likely due to iron deficiency. PLAN: -Discussed pt labwork today, 09/16/19; WBC & Hgb are nml, PLT have normalized, Ferritin is still low at 8 -Discussed 05/22/2019 JAK2, MPL, & CALR mutation studies which revealed "No Mutations Identified". -Recommend pt continue 150 mg PO Iron Polysaccharide BID. If Iron stable with repeat labs can go to once per day.  -Recommend pt continue f/u with OBGYN (Dr. Ilda Basset) for menorrhagia management  -Recommend pt f/u with PCP for repeat labs in 4 months and monitoring and adjustment of PO iron replacement. -Will see back as needed    FOLLOW UP: RTC with PCP and OBGYN RTC with Dr Irene Limbo as needed   The total time spent in the appt was 20 minutes and more than 50% was on counseling and direct patient cares.  All of the patient's questions were answered with apparent satisfaction. The patient knows to call the clinic with any problems, questions or concerns.    Sullivan Lone MD [place] AAHIVMS [facility] Hematology/Oncology Physician [facility]  (Office):       [phone] (Work cell):  [phone] (Fax):           [phone]  09/16/2019 12:23 PM  I, Yevette Edwards, am acting as a scribe for Dr. Sullivan Lone.   .I have reviewed the above documentation for accuracy and completeness, and I agree with the above. Brunetta Genera MD

## 2020-03-02 ENCOUNTER — Other Ambulatory Visit: Payer: Self-pay

## 2020-03-02 ENCOUNTER — Emergency Department (HOSPITAL_COMMUNITY)
Admission: EM | Admit: 2020-03-02 | Discharge: 2020-03-03 | Disposition: A | Discharge status: Home / Self Care | Payer: No Typology Code available for payment source | Attending: Emergency Medicine | Admitting: Emergency Medicine

## 2020-03-02 ENCOUNTER — Encounter (HOSPITAL_COMMUNITY): Payer: Self-pay | Admitting: Emergency Medicine

## 2020-03-02 ENCOUNTER — Emergency Department (HOSPITAL_COMMUNITY): Payer: No Typology Code available for payment source

## 2020-03-02 DIAGNOSIS — Y9389 Activity, other specified: Secondary | ICD-10-CM | POA: Insufficient documentation

## 2020-03-02 DIAGNOSIS — Y990 Civilian activity done for income or pay: Secondary | ICD-10-CM | POA: Diagnosis not present

## 2020-03-02 DIAGNOSIS — S6992XA Unspecified injury of left wrist, hand and finger(s), initial encounter: Secondary | ICD-10-CM

## 2020-03-02 DIAGNOSIS — Y929 Unspecified place or not applicable: Secondary | ICD-10-CM | POA: Insufficient documentation

## 2020-03-02 DIAGNOSIS — S62525A Nondisplaced fracture of distal phalanx of left thumb, initial encounter for closed fracture: Secondary | ICD-10-CM | POA: Diagnosis not present

## 2020-03-02 DIAGNOSIS — W230XXA Caught, crushed, jammed, or pinched between moving objects, initial encounter: Secondary | ICD-10-CM | POA: Diagnosis not present

## 2020-03-02 IMAGING — CR DG HAND COMPLETE 3+V*L*
3 series · 3 of 3 positions shown · non-contrast
Comparison: None.

CLINICAL DATA: Left fifth digit pain after left hand injury at work
today

EXAM:
LEFT HAND - COMPLETE 3+ VIEW

[hand pa]
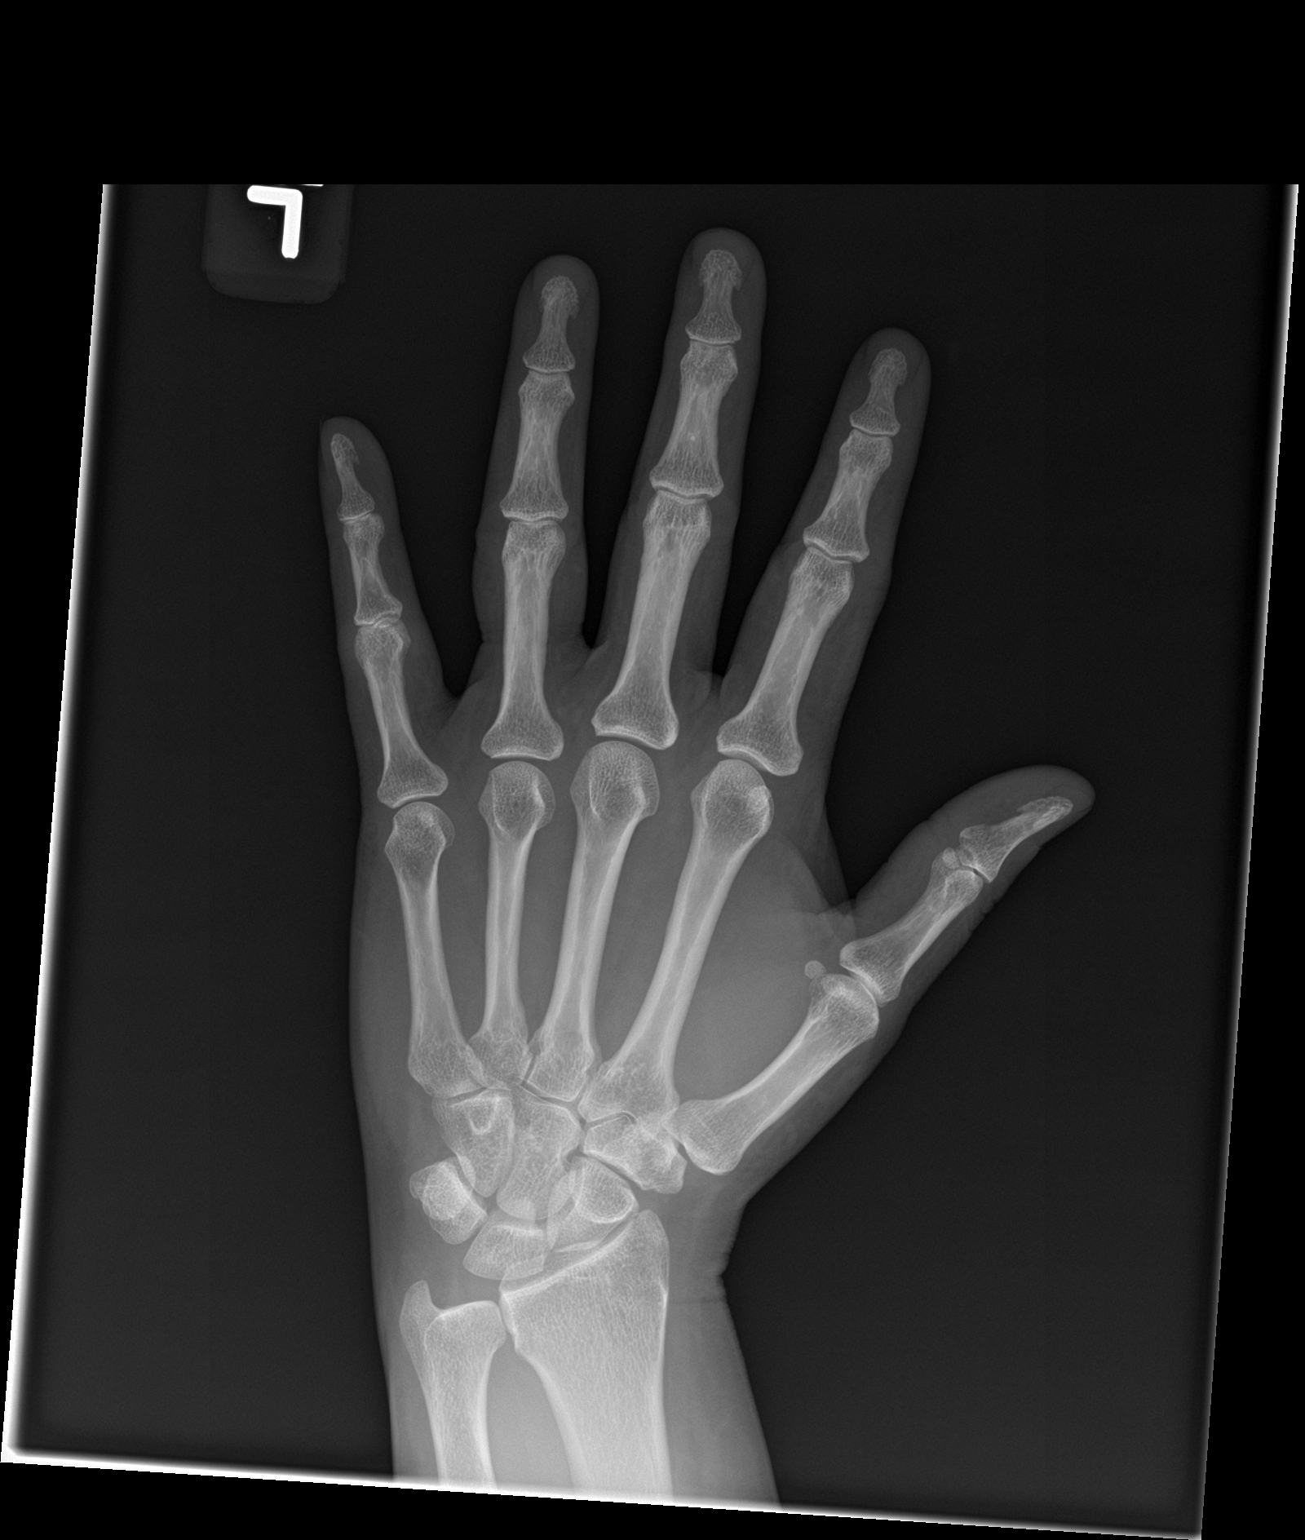

[hand obl]
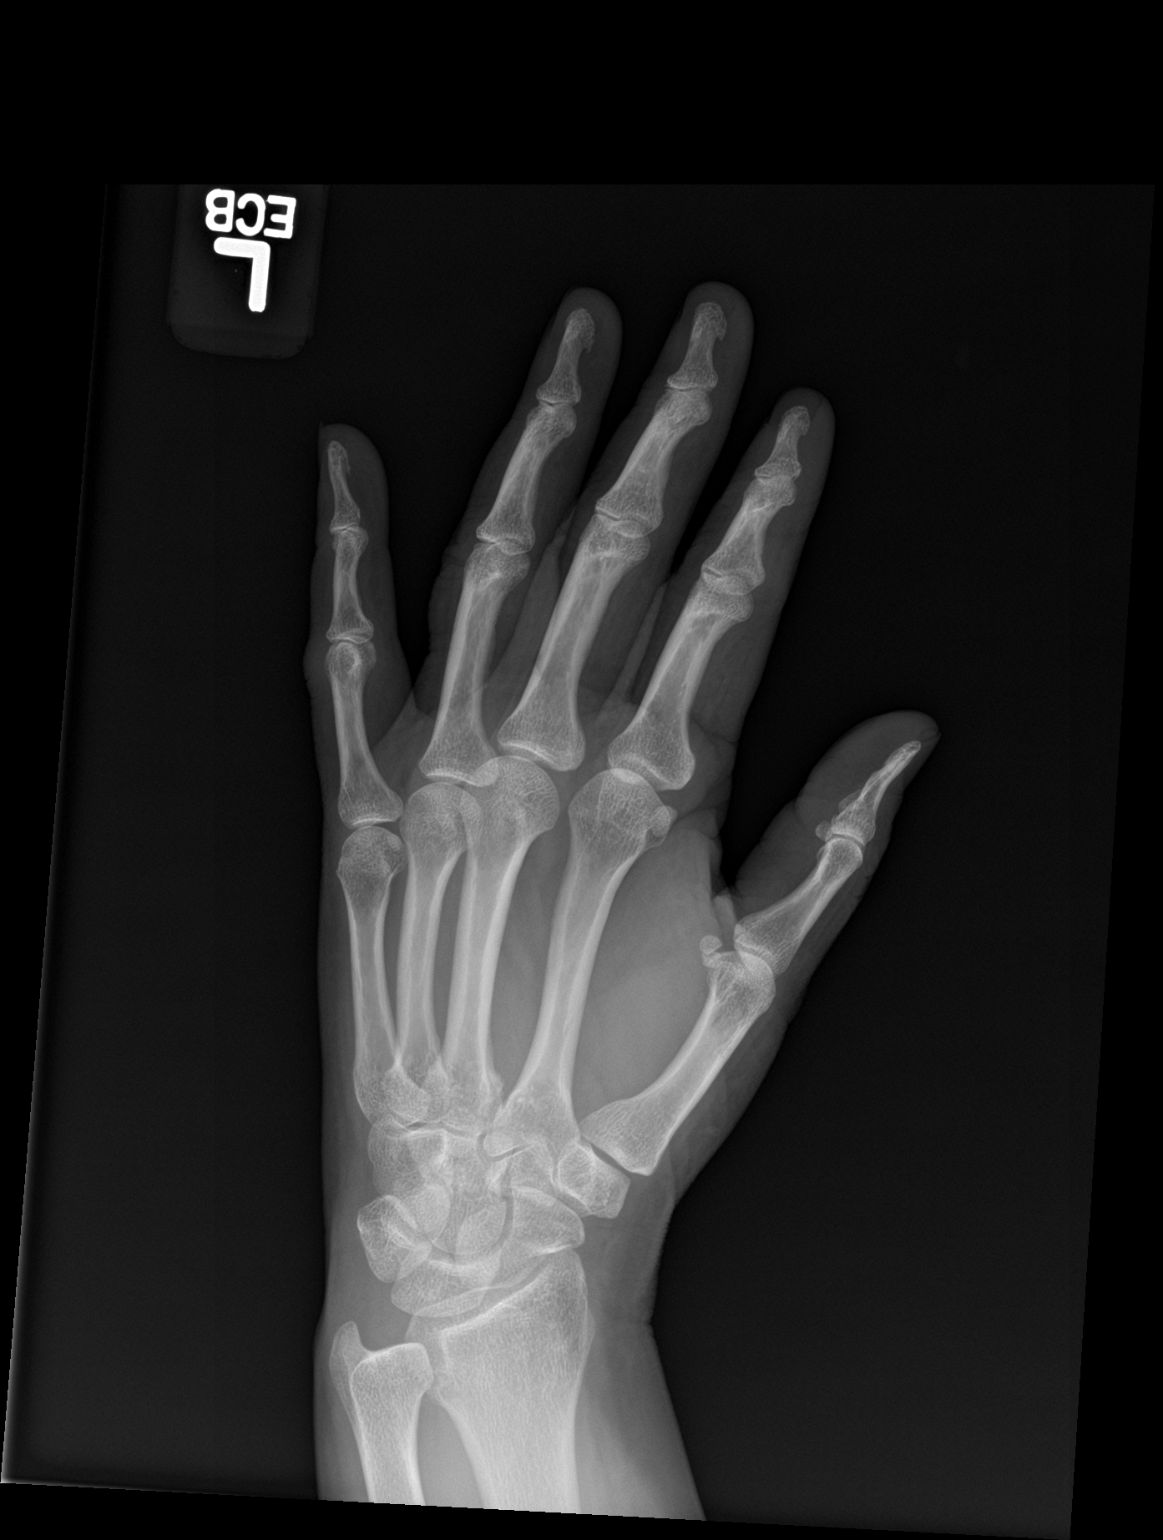

[hand lat]
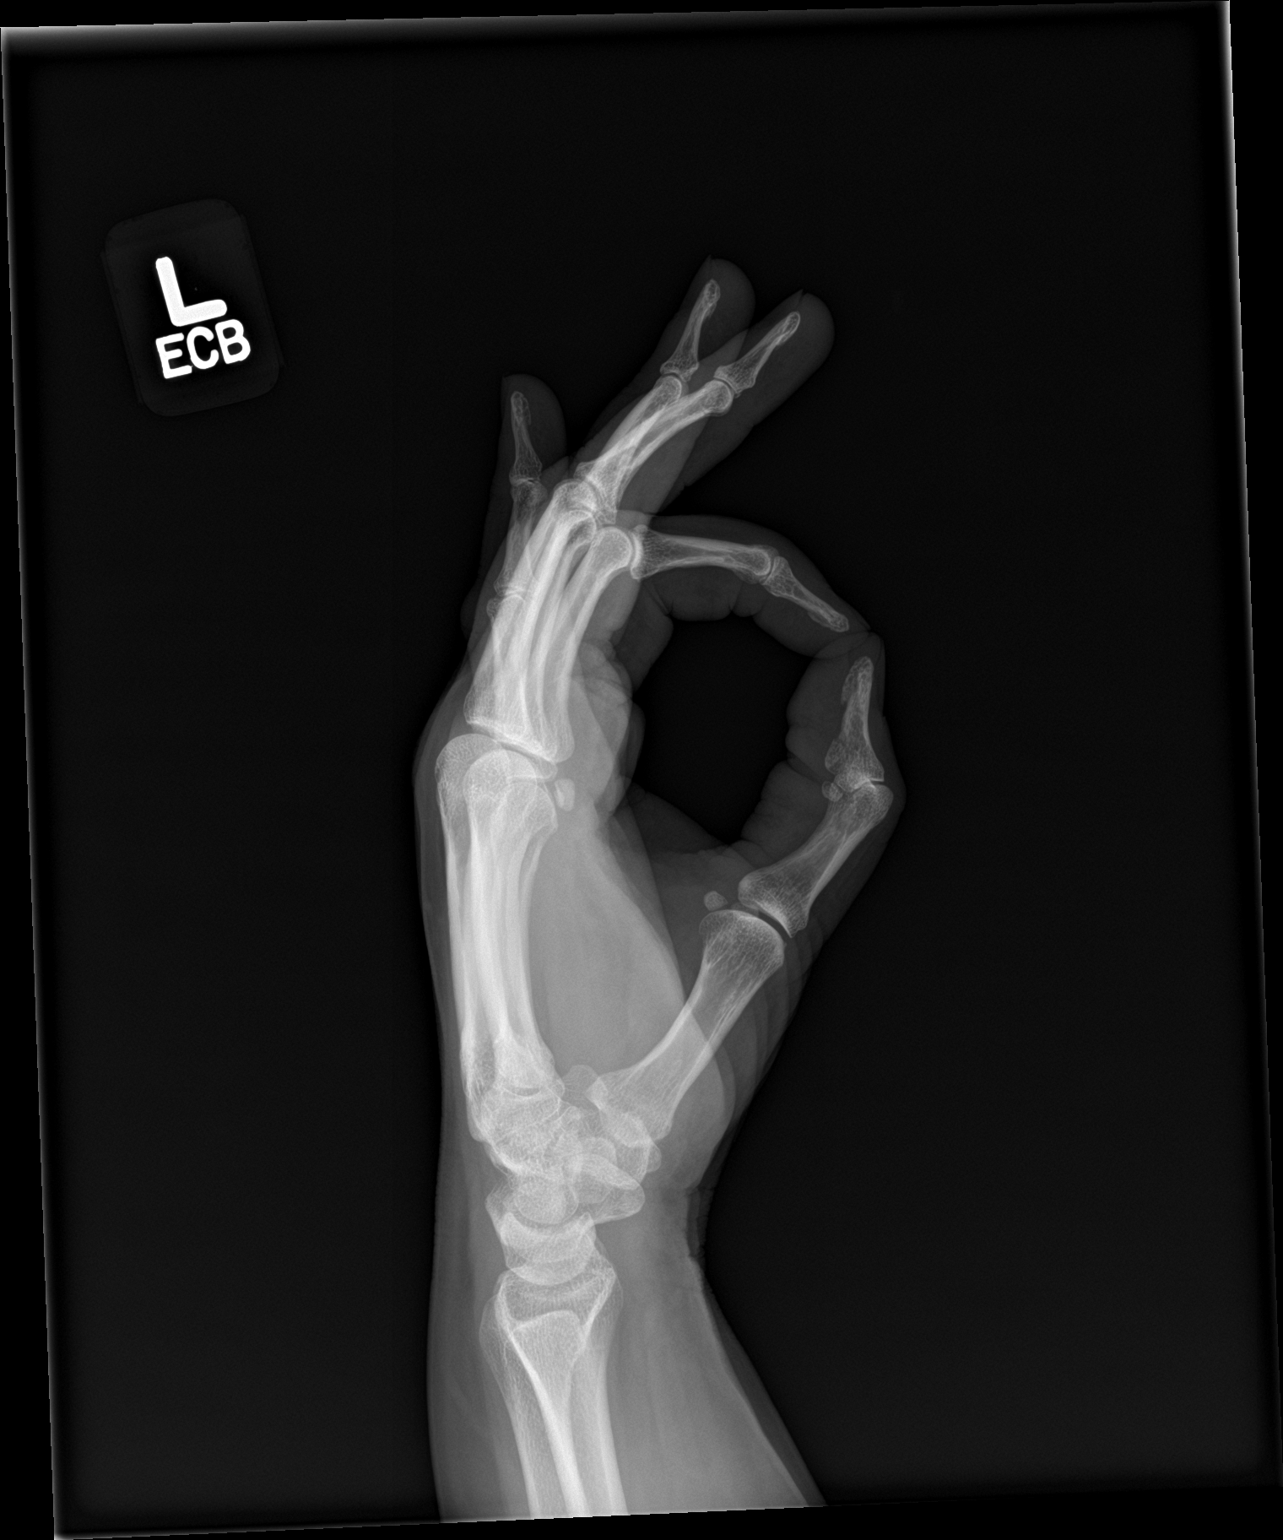

[3 of 3 positions shown; findings below may reference images not displayed]

FINDINGS: Frontal, oblique, and lateral views of the left hand are obtained.
There is a minimally displaced fracture through the base of the
first distal phalanx. No other acute displaced fractures. Mild
diffuse osteoarthritis most pronounced throughout the distal
interphalangeal joints. Soft tissue swelling of the first digit.
IMPRESSION: 1. Minimally displaced fracture through the base of the first distal
phalanx.
2. Osteoarthritis.

## 2020-03-02 NOTE — ED Triage Notes (Signed)
Pt c/o left pinky finger injury today while working.

## 2020-03-03 MED ORDER — IBUPROFEN 800 MG PO TABS
800.0000 mg | ORAL_TABLET | Freq: Once | ORAL | Status: AC
  Administered 2020-03-03: 800 mg via ORAL
  Filled 2020-03-03: qty 1

## 2020-03-03 MED ORDER — HYDROCODONE-ACETAMINOPHEN 5-325 MG PO TABS
1.0000 | ORAL_TABLET | Freq: Four times a day (QID) | ORAL | 0 refills | Status: DC | PRN
Start: 2020-03-03 — End: 2020-09-14

## 2020-03-03 NOTE — ED Provider Notes (Signed)
[place] EMERGENCY DEPARTMENT Provider Note   CSN: [id number] Arrival date & time: 03/02/20  1733     History Chief Complaint  Patient presents with  . Finger Injury    Mariah Lang Jarome Lamas is a 49 y.o. female presenting to the ED with complaint of left 5th finger injury that occurred yesterday morning while at work. She states she was putting a cart that she uses for work into a closet and jammed her finger.  She states her PIP joint of her fifth finger was deformed in the valgus direction.  She quickly put her finger back in place.  She has had persistent pain and swelling to that joint of the finger since that time.  She has pain with range of motion and some bruising.  She denies pain to any other location in her hand or wrist.  He is right-hand dominant.  Did not treat her symptoms with any medications prior to arrival. She works at a hotel and changes the linens.  The history is provided by the patient. A language interpreter was used.       Past Medical History:  Diagnosis Date  . Anemia     Patient Active Problem List   Diagnosis Date Noted  . Thrombocytosis 05/11/2019  . Language barrier 12/31/2018  . Screening breast examination 12/04/2018  . Fibroid uterus 12/18/2014  . Menorrhagia, premenopausal 11/24/2014  . Iron deficiency anemia 11/24/2014  . Iron deficiency anemia due to chronic blood loss 09/02/2014  . Pap smear for cervical cancer screening 09/02/2014  . Annual physical exam 09/02/2014  . Dysmenorrhea 02/15/2013  . Anemia due to Dysmenorrhea 02/15/2013    Past Surgical History:  Procedure Laterality Date  . APPENDECTOMY    . CESAREAN SECTION  2000  . OTHER SURGICAL HISTORY  1993   c-section     OB History    Gravida  2   Para  2   Term  2   Preterm  0   AB  0   Living  2     SAB  0   TAB  0   Ectopic  0   Multiple      Live Births  2           Family History  Problem Relation Age of  Onset  . Diabetes Mother   . Hypertension Mother   . Diabetes Paternal Grandmother     Social History   Tobacco Use  . Smoking status: Never Smoker  . Smokeless tobacco: Never Used  Vaping Use  . Vaping Use: Never used  Substance Use Topics  . Alcohol use: No  . Drug use: No    Home Medications Prior to Admission medications   Medication Sig Start Date End Date Taking? Authorizing Provider  Calcium Carbonate-Vit D-Min (CALCIUM 1200) 1200-1000 MG-UNIT CHEW Chew 1 tablet by mouth daily. 12/31/18   Aletha Halim, MD  cholecalciferol (VITAMIN D3) 25 MCG (1000 UT) tablet Take 1 tablet (1,000 Units total) by mouth daily. 12/31/18   Aletha Halim, MD  HYDROcodone-acetaminophen (NORCO/VICODIN) 5-325 MG tablet Take 1-2 tablets by mouth every 6 (six) hours as needed for severe pain. 03/03/20   Jalah Warmuth, [place] N, PA-C  iron polysaccharides (NIFEREX) 150 MG capsule Take 1 capsule (150 mg total) by mouth 2 (two) times daily. 09/16/19   Brunetta Genera, MD    Allergies    Patient has no known allergies.  Review of Systems   Review of Systems  Musculoskeletal: Positive for arthralgias.  Skin: Positive for color change. Negative for wound.    Physical Exam Updated Vital Signs BP (!) 127/91 (BP Location: Left Arm)   Pulse 66   Temp 98.6 F (37 C) (Oral)   Resp 18   Ht 5' (1.524 m)   Wt 87.2 kg   SpO2 100%   BMI 37.54 kg/m   Physical Exam Vitals and nursing note reviewed.  Constitutional:      Appearance: She is well-developed.  HENT:     Head: Normocephalic and atraumatic.  Eyes:     Conjunctiva/sclera: Conjunctivae normal.  Cardiovascular:     Rate and Rhythm: Normal rate.  Pulmonary:     Effort: Pulmonary effort is normal.  Musculoskeletal:     Comments: Left 5th digit PIP with swellling and slight bruising that extends proximally. Pt is able to range the digit though has pain with doing so. Unable to fully assess strength 2/t pain. No wounds or deformity  noted. Left thumb is not swollen, no bruising, wounds, or tenderness. Full ROM without pain.   Skin:    Capillary Refill: Capillary refill takes less than 2 seconds.  Neurological:     Mental Status: She is alert.     Comments: Normal sensation to fingers tips of left hand.   Psychiatric:        Mood and Affect: Mood normal.        Behavior: Behavior normal.     ED Results / Procedures / Treatments   Labs (all labs ordered are listed, but only abnormal results are displayed) Labs Reviewed - No data to display  EKG None  Radiology DG Hand Complete Left  Result Date: 03/02/2020 CLINICAL DATA:  Left fifth digit pain after left hand injury at work today EXAM: LEFT HAND - COMPLETE 3+ VIEW COMPARISON:  None. FINDINGS: Frontal, oblique, and lateral views of the left hand are obtained. There is a minimally displaced fracture through the base of the first distal phalanx. No other acute displaced fractures. Mild diffuse osteoarthritis most pronounced throughout the distal interphalangeal joints. Soft tissue swelling of the first digit. IMPRESSION: 1. Minimally displaced fracture through the base of the first distal phalanx. 2. Osteoarthritis. Electronically Signed   By: Randa Ngo M.D.   On: 03/02/2020 18:19    Procedures Procedures (including critical care time)  Medications Ordered in ED Medications  ibuprofen (ADVIL) tablet 800 mg (has no administration in time range)    ED Course  I have reviewed the triage vital signs and the nursing notes.  Pertinent labs & imaging results that were available during my care of the patient were reviewed by me and considered in my medical decision making (see chart for details).    MDM Rules/Calculators/A&P                          Patient presenting with complaint of dislocation to left 5th digit PIP joint which she reduced after it occurred. This occurred yesterday. She has pain and swelling to the digit, pain with ROM, unable to fully  assess strength or for tendon injury. However given description of injury there is concern for lateral ligamentous vs tendon injury.  No wounds, neurovascular intact.  Xray obtained in triage reveals acute fracture to 1st distal phalanx, no fracture reported to the fifth digit.  X-ray was independently reviewed and interpreted by myself.  There does appear to be acute fracture to the first distal phalanx, no  obvious fracture is noted to the fifth digit.  Patient has no pain or tenderness to the thumb, no swelling is noted.  However, will place both digits in splints and provide patient with hand specialist referral for close follow-up.  Expressed concern for ligament/tendon injury to the fifth digit and importance for follow-up.  Patient verbalized understanding and agrees with care plan.  RICE therapy, NSAIDs, small prescription for pain medication as provided.  [place] Controlled Substance reporting System queried  Discussed results, findings, treatment and follow up. Patient advised of return precautions. Patient verbalized understanding and agreed with plan.  Final Clinical Impression(s) / ED Diagnoses Final diagnoses:  Injury of finger of left hand, initial encounter  Closed nondisplaced fracture of distal phalanx of left thumb, initial encounter    Rx / DC Orders ED Discharge Orders         Ordered    HYDROcodone-acetaminophen (NORCO/VICODIN) 5-325 MG tablet  Every 6 hours PRN        03/03/20 0709           Giovanny Dugal, [place] N, PA-C 03/03/20 9977    Merrily Pew, MD 03/04/20 [phone]

## 2020-03-03 NOTE — Progress Notes (Signed)
Orthopedic Tech Progress Note Patient Details:  Mariah Lang 12-27-1970 [id number]  Ortho Devices Type of Ortho Device: Finger splint Ortho Device/Splint Location: LUE Ortho Device/Splint Interventions: Ordered, Application, Adjustment   Post Interventions Patient Tolerated: Well Instructions Provided: Care of device   Janit Pagan 03/03/2020, 8:07 AM

## 2020-03-03 NOTE — Discharge Instructions (Signed)
Please read instructions below. Keep the splint clean, dry and in place at all times when not bathing. Elevate your hand as much as possible. Apply ice to your fingers for 20 minutes at a time to help with pain and swelling. You can take 600mg  ibuprofen every 6 hours as needed for pain. You can take hydrocodone every 6 hours for more severe pain.  Be aware this medication can make you drowsy. Do not operate motor vehicles or drink alcohol while taking this medication. Call the orthopedic hand specialist office the next business day to schedule an appointment for repeat xray and follow up on your injury. Return to the ED for concerning symptoms.

## 2020-03-06 ENCOUNTER — Ambulatory Visit (HOSPITAL_COMMUNITY)
Admission: EM | Admit: 2020-03-06 | Discharge: 2020-03-06 | Disposition: A | Discharge status: Still In Hospital | Payer: Self-pay

## 2020-09-14 ENCOUNTER — Other Ambulatory Visit: Payer: Self-pay

## 2020-09-14 ENCOUNTER — Ambulatory Visit: Payer: Self-pay | Admitting: Physician Assistant

## 2020-09-14 VITALS — BP 128/81 | HR 73 | Temp 98.2°F | Resp 18 | Ht 60.0 in | Wt 198.0 lb

## 2020-09-14 DIAGNOSIS — Z1159 Encounter for screening for other viral diseases: Secondary | ICD-10-CM

## 2020-09-14 DIAGNOSIS — D509 Iron deficiency anemia, unspecified: Secondary | ICD-10-CM

## 2020-09-14 DIAGNOSIS — E559 Vitamin D deficiency, unspecified: Secondary | ICD-10-CM

## 2020-09-14 DIAGNOSIS — Z13220 Encounter for screening for lipoid disorders: Secondary | ICD-10-CM

## 2020-09-14 DIAGNOSIS — M25472 Effusion, left ankle: Secondary | ICD-10-CM

## 2020-09-14 DIAGNOSIS — D75839 Thrombocytosis, unspecified: Secondary | ICD-10-CM

## 2020-09-14 DIAGNOSIS — M25471 Effusion, right ankle: Secondary | ICD-10-CM

## 2020-09-14 DIAGNOSIS — R7303 Prediabetes: Secondary | ICD-10-CM

## 2020-09-14 LAB — POCT GLYCOSYLATED HEMOGLOBIN (HGB A1C): Hemoglobin A1C: 5.7 % — AB (ref 4.0–5.6)

## 2020-09-14 NOTE — Patient Instructions (Signed)
We will call you with your lab results.  Kennieth Rad, PA-C Physician Assistant [facility] Medicine [email or web address]

## 2020-09-14 NOTE — Progress Notes (Signed)
Established Patient Office Visit  Subjective:  Patient ID: Mariah Lang, female    DOB: 1970/12/25  Age: 50 y.o. MRN: [id number]  CC:  Chief Complaint  Patient presents with   Joint Swelling    bilateral    HPI Mariah Lang presents for evaluation of lower extremity edema. She states she noticed swelling in her ankle about 2 months ago. The swelling has been constant and has not gotten better or worse. She has tried changing her shoes, using creams on the area, and soaking her feet but none of these interventions have led to improvement. She does not notice anything that makes her symptoms worse. She states she feels weakness in her lower extremities when she gets up to walk after sitting for a long period of time. She reports left heel pain but denies any ankle or right heel pain. She denies any redness in her ankles. The swelling in her ankles does not advance into her lower legs. She denies any shortness of breath or chest pain. She does not monitor her sodium intake. The patient reports a history of anemia but denies any other known medical problems. She has not been taking any of her medications recently due to the [place] pandemic.   Due to language barrier, an interpreter was present during the history-taking and subsequent discussion (and for part of the physical exam) with this patient.     Past Medical History:  Diagnosis Date   Anemia     Past Surgical History:  Procedure Laterality Date   APPENDECTOMY     CESAREAN SECTION  2000   OTHER SURGICAL HISTORY  1993   c-section    Family History  Problem Relation Age of Onset   Diabetes Mother    Hypertension Mother    Diabetes Paternal Grandmother     Social History   Socioeconomic History   Marital status: Single    Spouse name: Not on file   Number of children: 2   Years of education: Not on file   Highest education level: 9th grade  Occupational History   Not on file   Tobacco Use   Smoking status: Never   Smokeless tobacco: Never  Vaping Use   Vaping Use: Never used  Substance and Sexual Activity   Alcohol use: No   Drug use: No   Sexual activity: Yes    Birth control/protection: None, Injection  Other Topics Concern   Not on file  Social History Narrative   ** Merged History Encounter **       Social Determinants of Health   Financial Resource Strain: Not on file  Food Insecurity: Not on file  Transportation Needs: Not on file  Physical Activity: Not on file  Stress: Not on file  Social Connections: Not on file  Intimate Partner Violence: Not on file    Outpatient Medications Prior to Visit  Medication Sig Dispense Refill   iron polysaccharides (NIFEREX) 150 MG capsule Take 1 capsule (150 mg total) by mouth 2 (two) times daily. 60 capsule 5   Calcium Carbonate-Vit D-Min (CALCIUM 1200) 1200-1000 MG-UNIT CHEW Chew 1 tablet by mouth daily. (Patient not taking: Reported on 09/14/2020) 30 tablet    cholecalciferol (VITAMIN D3) 25 MCG (1000 UT) tablet Take 1 tablet (1,000 Units total) by mouth daily. (Patient not taking: Reported on 09/14/2020)     HYDROcodone-acetaminophen (NORCO/VICODIN) 5-325 MG tablet Take 1-2 tablets by mouth every 6 (six) hours as needed for severe pain. (Patient not  taking: Reported on 09/14/2020) 8 tablet 0   Facility-Administered Medications Prior to Visit  Medication Dose Route Frequency Provider Last Rate Last Admin   tranexamic acid (LYSTEDA) tablet 1,300 mg  1,300 mg Oral TID Aletha Halim, MD        No Known Allergies  ROS Review of Systems  Constitutional:  Negative for fever.  HENT:  Negative for congestion and sore throat.   Eyes:  Negative for pain.  Respiratory:  Negative for cough and shortness of breath.   Cardiovascular:  Positive for leg swelling (Bilateral ankle edema). Negative for chest pain.  Gastrointestinal:  Negative for abdominal pain, diarrhea, nausea and vomiting.  Genitourinary:   Negative for dysuria.  Musculoskeletal:        Right heel pain  Skin:        Dry skin on feet  Neurological:  Negative for headaches.     Objective:    Physical Exam Vitals and nursing note reviewed.  Constitutional:      General: She is not in acute distress.    Appearance: She is obese.  HENT:     Head: Normocephalic and atraumatic.  Eyes:     Conjunctiva/sclera: Conjunctivae normal.  Cardiovascular:     Rate and Rhythm: Normal rate and regular rhythm.     Heart sounds: Normal heart sounds. No murmur heard.   No friction rub. No gallop.  Pulmonary:     Effort: Pulmonary effort is normal. No respiratory distress.     Breath sounds: Normal breath sounds.  Abdominal:     General: There is no distension.     Palpations: Abdomen is soft.  Musculoskeletal:        General: Swelling (Bilateral ankle swelling) present.     Cervical back: Normal range of motion.  Skin:    General: Skin is warm and dry.     Comments: Diffuse dry skin noted on feet bilaterally   Neurological:     Mental Status: She is alert.  Psychiatric:        Mood and Affect: Mood normal.        Behavior: Behavior normal.    BP 128/81 (BP Location: Left Arm, Patient Position: Sitting, Cuff Size: Large)   Pulse 73   Temp 98.2 F (36.8 C) (Oral)   Resp 18   Ht 5' (1.524 m)   Wt 89.8 kg   SpO2 99%   BMI 38.67 kg/m  Wt Readings from Last 3 Encounters:  09/14/20 89.8 kg  03/02/20 87.2 kg  09/16/19 87.2 kg     Health Maintenance Due  Topic Date Due   COVID-19 Vaccine (1) Never done   Hepatitis C Screening  Never done   TETANUS/TDAP  Never done   COLONOSCOPY (Pts 45-45yrs Insurance coverage will need to be confirmed)  Never done   PAP SMEAR-Modifier  09/01/2017    There are no preventive care reminders to display for this patient.  Lab Results  Component Value Date   TSH 1.279 09/02/2014   Lab Results  Component Value Date   WBC 8.5 09/16/2019   HGB 14.1 09/16/2019   HCT 44.1  09/16/2019   MCV 82.1 09/16/2019   PLT 396 09/16/2019   Lab Results  Component Value Date   NA 139 05/22/2019   K 4.0 05/22/2019   CO2 23 05/22/2019   GLUCOSE 80 05/22/2019   BUN 10 05/22/2019   CREATININE 0.63 05/22/2019   BILITOT 0.3 05/22/2019   ALKPHOS 57 05/22/2019   AST 16  05/22/2019   ALT 13 05/22/2019   PROT 7.8 05/22/2019   ALBUMIN 3.9 05/22/2019   CALCIUM 9.0 05/22/2019   ANIONGAP 7 05/22/2019   Lab Results  Component Value Date   CHOL 128 06/11/2018   Lab Results  Component Value Date   HDL 50 06/11/2018   Lab Results  Component Value Date   LDLCALC 68 06/11/2018   Lab Results  Component Value Date   TRIG 48 06/11/2018   Lab Results  Component Value Date   CHOLHDL 2.6 06/11/2018   Lab Results  Component Value Date   HGBA1C 5.7 (A) 09/14/2020      Assessment & Plan:   Problem List Items Addressed This Visit       Hematopoietic and Hemostatic   Thrombocytosis     Other   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 Lab work ordered today. Will call patient with results. Follow up as needed.        Other Visit Diagnoses     Encounter for HCV screening test for low risk patient       Localized edema       Relevant Orders   Brain natriuretic peptide   TSH   Comp. Metabolic Panel (12)   HCV Ab w Reflex to Quant PCR   Vitamin D deficiency       Relevant Orders   Vitamin D, 25-hydroxy   Screening, lipid       Relevant Orders   Lipid panel   Prediabetes         1. Ankle edema, bilateral Patient education given on lifestyle modifications.  Patient is in process of reapplying for [place] financial assistance, patient given  [facility].  Fasting labs completed today - Brain natriuretic peptide - TSH - Comp. Metabolic Panel (12) - HCV Ab w Reflex to Quant PCR  2. Iron deficiency anemia, unspecified iron deficiency anemia type  - CBC with Differential/Platelet - Iron, TIBC and Ferritin Panel  3. Prediabetes A1c 5.7 - HgB A1c  4. Thrombocytosis   5. Vitamin D deficiency - Vitamin D, 25-hydroxy  6. Encounter for HCV screening test for low risk patient   7. Screening, lipid  - Lipid panel   I have reviewed , along with the Student-PA , the patient's medical history (PMH, PSH, Social History, Family History, Medications, and allergies) , and have been updated if relevant. I spent 32 minutes reviewing chart and  face to face time with patient.   No orders of the defined types were placed in this encounter.   Follow-up: Return if symptoms worsen or fail to improve.   Rockie Neighbours, Student-PA  I agree with history, assessment and plan Kennieth Rad, PA-C Physician Assistant [place]

## 2020-09-14 NOTE — Assessment & Plan Note (Signed)
Discussed treatments to reduce ankle edema including compression socks, elevating lower extremities, reducing salt intake. Lab work ordered today. Will call patient with results. Follow up as needed.

## 2020-09-14 NOTE — Progress Notes (Signed)
Patient presents to generate an OV for financial assistance. Patient reports bilateral ankle swelling with no pain. Patient shares the area has increased with fluid. Patient has not eaten today nor taken medication. Patient works at the [employer] as a Solicitor.

## 2020-09-15 ENCOUNTER — Other Ambulatory Visit: Payer: Self-pay

## 2020-09-15 LAB — TSH: TSH: 1.55 u[IU]/mL (ref 0.450–4.500)

## 2020-09-15 LAB — LIPID PANEL
Chol/HDL Ratio: 4.1 ratio (ref 0.0–4.4)
Cholesterol, Total: 189 mg/dL (ref 100–199)
HDL: 46 mg/dL (ref >39)
LDL Chol Calc (NIH): 110 mg/dL — ABNORMAL HIGH (ref 0–99)
Triglycerides: 190 mg/dL — ABNORMAL HIGH (ref 0–149)
VLDL Cholesterol Cal: 33 mg/dL (ref 5–40)

## 2020-09-15 LAB — CBC WITH DIFFERENTIAL/PLATELET
Basophils Absolute: 0.1 10*3/uL (ref 0.0–0.2)
Basos: 1 %
EOS (ABSOLUTE): 0.1 10*3/uL (ref 0.0–0.4)
Eos: 1 %
Hematocrit: 48.4 % — ABNORMAL HIGH (ref 34.0–46.6)
Hemoglobin: 14.6 g/dL (ref 11.1–15.9)
Immature Grans (Abs): 0 10*3/uL (ref 0.0–0.1)
Immature Granulocytes: 0 %
Lymphocytes Absolute: 2 10*3/uL (ref 0.7–3.1)
Lymphs: 25 %
MCH: 25.1 pg — ABNORMAL LOW (ref 26.6–33.0)
MCHC: 30.2 g/dL — ABNORMAL LOW (ref 31.5–35.7)
MCV: 83 fL (ref 79–97)
Monocytes Absolute: 0.6 10*3/uL (ref 0.1–0.9)
Monocytes: 8 %
Neutrophils Absolute: 5.4 10*3/uL (ref 1.4–7.0)
Neutrophils: 65 %
Platelets: 449 10*3/uL (ref 150–450)
RBC: 5.82 x10E6/uL — ABNORMAL HIGH (ref 3.77–5.28)
RDW: 17.5 % — ABNORMAL HIGH (ref 11.7–15.4)
WBC: 8.2 10*3/uL (ref 3.4–10.8)

## 2020-09-15 LAB — IRON,TIBC AND FERRITIN PANEL
Ferritin: 16 ng/mL (ref 15–150)
Iron Saturation: 19 % (ref 15–55)
Iron: 65 ug/dL (ref 27–159)
Total Iron Binding Capacity: 346 ug/dL (ref 250–450)
UIBC: 281 ug/dL (ref 131–425)

## 2020-09-15 LAB — COMP. METABOLIC PANEL (12)
AST: 18 IU/L (ref 0–40)
Albumin/Globulin Ratio: 1.4 (ref 1.2–2.2)
Albumin: 4.6 g/dL (ref 3.8–4.8)
Alkaline Phosphatase: 73 IU/L (ref 44–121)
BUN/Creatinine Ratio: 17 (ref 9–23)
BUN: 9 mg/dL (ref 6–24)
Bilirubin Total: 0.3 mg/dL (ref 0.0–1.2)
Calcium: 9.9 mg/dL (ref 8.7–10.2)
Chloride: 103 mmol/L (ref 96–106)
Creatinine, Ser: 0.54 mg/dL — ABNORMAL LOW (ref 0.57–1.00)
Globulin, Total: 3.2 g/dL (ref 1.5–4.5)
Glucose: 90 mg/dL (ref 65–99)
Potassium: 4.8 mmol/L (ref 3.5–5.2)
Sodium: 138 mmol/L (ref 134–144)
Total Protein: 7.8 g/dL (ref 6.0–8.5)
eGFR: 113 mL/min/{1.73_m2} (ref >59)

## 2020-09-15 LAB — VITAMIN D 25 HYDROXY (VIT D DEFICIENCY, FRACTURES): Vit D, 25-Hydroxy: 15.1 ng/mL — ABNORMAL LOW (ref 30.0–100.0)

## 2020-09-15 LAB — HCV INTERPRETATION

## 2020-09-15 LAB — HCV AB W REFLEX TO QUANT PCR: HCV Ab: 0.1 s/co ratio (ref 0.0–0.9)

## 2020-09-15 LAB — BRAIN NATRIURETIC PEPTIDE: BNP: 6.9 pg/mL (ref 0.0–100.0)

## 2020-09-15 MED ORDER — VITAMIN D (ERGOCALCIFEROL) 1.25 MG (50000 UNIT) PO CAPS
50000.0000 [IU] | ORAL_CAPSULE | ORAL | 2 refills | Status: DC
  Filled 2020-09-15 – 2020-09-23 (×2): qty 4, 28d supply, fill #0
  Filled 2020-10-21: qty 4, 28d supply, fill #1
  Filled 2020-11-17: qty 4, 28d supply, fill #2

## 2020-09-15 NOTE — Addendum Note (Signed)
Addended by: Kennieth Rad on: 09/15/2020 05:15 PM   Modules accepted: Orders

## 2020-09-16 ENCOUNTER — Other Ambulatory Visit: Payer: Self-pay

## 2020-09-16 ENCOUNTER — Telehealth: Payer: Self-pay | Admitting: *Deleted

## 2020-09-16 NOTE — Telephone Encounter (Signed)
Medical Assistant used [employer] to contact patient.  Interpreter Name:Laura Interpreter #: [id number] Patient verified DOB Patient is aware of no anemia being noted with normal kidney, liver and thyroid function. Patient is aware of hep c being negative. Patient is aware of beginning weekly supplement for vitamin d and having a recheck completed in 3 months. Patient is aware of triglycerides and LDL being elevated and needing to adhere to low cholesterol diet and having this monitored yearly.

## 2020-09-16 NOTE — Telephone Encounter (Signed)
-----   Message from Kennieth Rad, [place] sent at 09/15/2020  5:15 PM EDT ----- Please call patient and let her know that her thyroid function, kidney and liver function are within normal limits.  She does show signs of dehydration.  She does not show signs of anemia and her iron levels are within normal limits.  Her screening for hepatitis C was negative.  Her vitamin D is low, she should take 50,000 units once a week for the next 12 weeks and have her levels rechecked at that time.  Prescription sent to her pharmacy. Her cholesterol overall is within normal limits, however her LDL and triglycerides are elevated.  Her risk of a cardiovascular event in the next 10 years is only 1%.  She does not need to start cholesterol medication at this time, however she does need to follow a low-cholesterol diet and have her cholesterol monitored on a yearly basis.  The 10-year ASCVD risk score Mikey Bussing DC Brooke Bonito., et al., 2013) is: 1.2%   Values used to calculate the score:     Age: 50 years     Sex: Female     Is Non-Hispanic African American: No     Diabetic: No     Tobacco smoker: No     Systolic Blood Pressure: 258 mmHg     Is BP treated: No     HDL Cholesterol: 46 mg/dL     Total Cholesterol: 189 mg/dL

## 2020-09-23 ENCOUNTER — Other Ambulatory Visit: Payer: Self-pay

## 2020-09-23 ENCOUNTER — Ambulatory Visit: Payer: Self-pay | Attending: Family Medicine

## 2020-10-21 ENCOUNTER — Other Ambulatory Visit: Payer: Self-pay

## 2020-10-22 ENCOUNTER — Other Ambulatory Visit: Payer: Self-pay

## 2020-11-15 ENCOUNTER — Ambulatory Visit (HOSPITAL_COMMUNITY)
Admission: EM | Admit: 2020-11-15 | Discharge: 2020-11-15 | Disposition: A | Discharge status: Home / Self Care | Payer: Self-pay | Attending: Student | Admitting: Student

## 2020-11-15 ENCOUNTER — Emergency Department (HOSPITAL_COMMUNITY)
Admission: EM | Admit: 2020-11-15 | Discharge: 2020-11-16 | Disposition: A | Discharge status: Home / Self Care | Payer: Self-pay | Attending: Emergency Medicine | Admitting: Emergency Medicine

## 2020-11-15 ENCOUNTER — Other Ambulatory Visit: Payer: Self-pay

## 2020-11-15 ENCOUNTER — Encounter (HOSPITAL_COMMUNITY): Payer: Self-pay | Admitting: Emergency Medicine

## 2020-11-15 ENCOUNTER — Encounter (HOSPITAL_COMMUNITY): Payer: Self-pay

## 2020-11-15 DIAGNOSIS — D259 Leiomyoma of uterus, unspecified: Secondary | ICD-10-CM | POA: Insufficient documentation

## 2020-11-15 DIAGNOSIS — N2889 Other specified disorders of kidney and ureter: Secondary | ICD-10-CM | POA: Insufficient documentation

## 2020-11-15 DIAGNOSIS — D219 Benign neoplasm of connective and other soft tissue, unspecified: Secondary | ICD-10-CM

## 2020-11-15 DIAGNOSIS — R1033 Periumbilical pain: Secondary | ICD-10-CM

## 2020-11-15 LAB — COMPREHENSIVE METABOLIC PANEL
ALT: 22 U/L (ref 0–44)
AST: 26 U/L (ref 15–41)
Albumin: 3.6 g/dL (ref 3.5–5.0)
Alkaline Phosphatase: 52 U/L (ref 38–126)
Anion gap: 5 (ref 5–15)
BUN: 13 mg/dL (ref 6–20)
CO2: 25 mmol/L (ref 22–32)
Calcium: 8.6 mg/dL — ABNORMAL LOW (ref 8.9–10.3)
Chloride: 109 mmol/L (ref 98–111)
Creatinine, Ser: 0.79 mg/dL (ref 0.44–1.00)
GFR, Estimated: >60 mL/min (ref >60)
Glucose, Bld: 89 mg/dL (ref 70–99)
Potassium: 3.9 mmol/L (ref 3.5–5.1)
Sodium: 139 mmol/L (ref 135–145)
Total Bilirubin: 0.5 mg/dL (ref 0.3–1.2)
Total Protein: 6.8 g/dL (ref 6.5–8.1)

## 2020-11-15 LAB — CBC WITH DIFFERENTIAL/PLATELET
Abs Immature Granulocytes: 0.02 10*3/uL (ref 0.00–0.07)
Basophils Absolute: 0.1 10*3/uL (ref 0.0–0.1)
Basophils Relative: 1 %
Eosinophils Absolute: 0.2 10*3/uL (ref 0.0–0.5)
Eosinophils Relative: 3 %
HCT: 41.5 % (ref 36.0–46.0)
Hemoglobin: 13 g/dL (ref 12.0–15.0)
Immature Granulocytes: 0 %
Lymphocytes Relative: 37 %
Lymphs Abs: 3 10*3/uL (ref 0.7–4.0)
MCH: 25.9 pg — ABNORMAL LOW (ref 26.0–34.0)
MCHC: 31.3 g/dL (ref 30.0–36.0)
MCV: 82.7 fL (ref 80.0–100.0)
Monocytes Absolute: 0.8 10*3/uL (ref 0.1–1.0)
Monocytes Relative: 10 %
Neutro Abs: 4 10*3/uL (ref 1.7–7.7)
Neutrophils Relative %: 49 %
Platelets: 465 10*3/uL — ABNORMAL HIGH (ref 150–400)
RBC: 5.02 MIL/uL (ref 3.87–5.11)
RDW: 15.1 % (ref 11.5–15.5)
WBC: 8.2 10*3/uL (ref 4.0–10.5)
nRBC: 0 % (ref 0.0–0.2)

## 2020-11-15 LAB — POCT URINALYSIS DIPSTICK, ED / UC
Bilirubin Urine: NEGATIVE
Glucose, UA: NEGATIVE mg/dL
Leukocytes,Ua: NEGATIVE
Nitrite: NEGATIVE
Protein, ur: 30 mg/dL — AB
Specific Gravity, Urine: 1.02 (ref 1.005–1.030)
Urobilinogen, UA: 1 mg/dL (ref 0.0–1.0)
pH: 7 (ref 5.0–8.0)

## 2020-11-15 LAB — POC URINE PREG, ED: Preg Test, Ur: NEGATIVE

## 2020-11-15 LAB — LIPASE, BLOOD: Lipase: 35 U/L (ref 11–51)

## 2020-11-15 NOTE — ED Triage Notes (Signed)
C/o LLQ pain since Friday that is relieved with ibuprofen and then returns.  Denies nausea, vomiting, diarrhea, constipation, and urinary symptoms.  Seen at [facility] and sent to ED.  [employer] used for interpretation.

## 2020-11-15 NOTE — ED Provider Notes (Signed)
[facility]    CSN: [id number] Arrival date & time: 11/15/20  1620      History   Chief Complaint Chief Complaint  Patient presents with   Abdominal Pain    HPI Mariah Lang is a 50 y.o. female presenting with abdominal pain x3 days. Medical history uterine fibroids, dysmenorrhea.  Endorses 3 days of pain under the umbilicus, worse with bending and straining.  Feeling well otherwise, denies change in bowel or bladder function, last bowel movement was today and she is still passing gas.  Denies nausea, vomiting, denies urinary symptoms, dysuria, frequency, urgency.  Denies fever/chills, recent URI.  HPI  Past Medical History:  Diagnosis Date   Anemia     Patient Active Problem List   Diagnosis Date Noted   Ankle edema, bilateral 09/14/2020   Vitamin D deficiency 09/14/2020   Thrombocytosis 05/11/2019   Language barrier 12/31/2018   Screening breast examination 12/04/2018   Fibroid uterus 12/18/2014   Menorrhagia, premenopausal 11/24/2014   Iron deficiency anemia 11/24/2014   Iron deficiency anemia due to chronic blood loss 09/02/2014   Pap smear for cervical cancer screening 09/02/2014   Annual physical exam 09/02/2014   Dysmenorrhea 02/15/2013   Anemia due to Dysmenorrhea 02/15/2013    Past Surgical History:  Procedure Laterality Date   APPENDECTOMY     CESAREAN SECTION  2000   OTHER SURGICAL HISTORY  1993   c-section    OB History     Gravida  2   Para  2   Term  2   Preterm  0   AB  0   Living  2      SAB  0   IAB  0   Ectopic  0   Multiple      Live Births  2            Home Medications    Prior to Admission medications   Medication Sig Start Date End Date Taking? Authorizing Provider  Calcium Carbonate-Vit D-Min (CALCIUM 1200) 1200-1000 MG-UNIT CHEW Chew 1 tablet by mouth daily. Patient not taking: No sig reported 12/31/18   Aletha Halim, MD  cholecalciferol (VITAMIN D3) 25 MCG (1000 UT)  tablet Take 1 tablet (1,000 Units total) by mouth daily. Patient not taking: No sig reported 12/31/18   Aletha Halim, MD  Vitamin D, Ergocalciferol, (DRISDOL) 1.25 MG (50000 UNIT) CAPS capsule Take 1 capsule (50,000 Units total) by mouth every 7 (seven) days. 09/15/20   Mayers, Loraine Grip, PA-C    Family History Family History  Problem Relation Age of Onset   Diabetes Mother    Hypertension Mother    Diabetes Paternal Grandmother     Social History Social History   Tobacco Use   Smoking status: Never   Smokeless tobacco: Never  Vaping Use   Vaping Use: Never used  Substance Use Topics   Alcohol use: No   Drug use: No     Allergies   Patient has no known allergies.   Review of Systems Review of Systems  Constitutional:  Negative for appetite change, chills, diaphoresis, fever and unexpected weight change.  HENT:  Negative for congestion, ear pain, sinus pressure, sinus pain, sneezing, sore throat and trouble swallowing.   Respiratory:  Negative for cough, chest tightness and shortness of breath.   Cardiovascular:  Negative for chest pain.  Gastrointestinal:  Positive for abdominal pain. Negative for abdominal distention, anal bleeding, blood in stool, constipation, diarrhea, nausea, rectal pain and  vomiting.  Genitourinary:  Negative for dysuria, flank pain, frequency and urgency.  Musculoskeletal:  Negative for back pain and myalgias.  Neurological:  Negative for dizziness, light-headedness and headaches.  All other systems reviewed and are negative.   Physical Exam Triage Vital Signs ED Triage Vitals  Enc Vitals Group     BP      Pulse      Resp      Temp      Temp src      SpO2      Weight      Height      Head Circumference      Peak Flow      Pain Score      Pain Loc      Pain Edu?      Excl. in [place]?    No data found.  Updated Vital Signs BP (!) 154/88 (BP Location: Right Arm)   Pulse 76   Temp 98.2 F (36.8 C) (Oral)   Resp 18   SpO2 98%    Visual Acuity Right Eye Distance:   Left Eye Distance:   Bilateral Distance:    Right Eye Near:   Left Eye Near:    Bilateral Near:     Physical Exam Vitals reviewed.  Constitutional:      General: She is not in acute distress.    Appearance: Normal appearance. She is obese. She is not ill-appearing.  HENT:     Head: Normocephalic and atraumatic.     Mouth/Throat:     Mouth: Mucous membranes are moist.     Comments: Moist mucous membranes Eyes:     Extraocular Movements: Extraocular movements intact.     Pupils: Pupils are equal, round, and reactive to light.  Cardiovascular:     Rate and Rhythm: Normal rate and regular rhythm.     Heart sounds: Normal heart sounds.  Pulmonary:     Effort: Pulmonary effort is normal.     Breath sounds: Normal breath sounds. No wheezing, rhonchi or rales.  Abdominal:     General: Bowel sounds are normal. There is no distension.     Palpations: Abdomen is soft. There is no mass.     Tenderness: There is abdominal tenderness in the periumbilical area. There is no right CVA tenderness, left CVA tenderness, guarding or rebound. Negative signs include Murphy's sign, Rovsing's sign and McBurney's sign.     Comments: BS positive throughout. Significantly TTP umbilicus with palpable, mobile mass present.   Skin:    General: Skin is warm.     Capillary Refill: Capillary refill takes less than 2 seconds.     Comments: Good skin turgor  Neurological:     General: No focal deficit present.     Mental Status: She is alert and oriented to person, place, and time.  Psychiatric:        Mood and Affect: Mood normal.        Behavior: Behavior normal.     UC Treatments / Results  Labs (all labs ordered are listed, but only abnormal results are displayed) Labs Reviewed  POCT URINALYSIS DIPSTICK, ED / UC - Abnormal; Notable for the following components:      Result Value   Ketones, ur TRACE (*)    Hgb urine dipstick MODERATE (*)    Protein, ur 30  (*)    All other components within normal limits  POC URINE PREG, ED    EKG   Radiology No results  found.  Procedures Procedures (including critical care time)  Medications Ordered in UC Medications - No data to display  Initial Impression / Assessment and Plan / UC Course  I have reviewed the triage vital signs and the nursing notes.  Pertinent labs & imaging results that were available during my care of the patient were reviewed by me and considered in my medical decision making (see chart for details).     This patient is a very pleasant 50 y.o. year old female presenting with suspected umbilical hernia. Afebrile, nontachy.   Last UTI 2020.  UA moderate blood, negative nitrite, negative leuk. Did not send culture. Perimenopausal, negative u-preg.  ED return precautions discussed. Patient verbalizes understanding and agreement.   Spoke with this patient using translation services.   Final Clinical Impressions(s) / UC Diagnoses   Final diagnoses:  Umbilical pain     Discharge Instructions      -Please head to the emergency department for further evaluation and management of your abdominal pain.  I am concerned that you have a hernia, and it is possible that a loop of bowel is stuck in the hernia.  This is a medical emergency, and must be diagnosed with imaging we do not have an urgent care, with possible surgical intervention.     ED Prescriptions   None    PDMP not reviewed this encounter.   Hazel Sams, PA-C 11/15/20 1736

## 2020-11-15 NOTE — ED Triage Notes (Signed)
Pt reports pain next to the belly button  x 3 days. Naproxen gives some relief. Denies dysuria, nausea, vomiting, diarrhea.

## 2020-11-15 NOTE — ED Provider Notes (Signed)
Emergency Medicine Provider Triage Evaluation Note  Mariah Lang , a 50 y.o. female  was evaluated in triage.  Pt complains of intermittent left lower abdominal pain x3 days.  Ibuprofen helps with pain.  She went to urgent care prior to arrival and they sent her to ER for evaluation as they were concerned for possible hernia.  Abdominal surgical history includes appendectomy.    Review of Systems  Positive: Abdominal pain Negative: Nausea, emesis, appetite change  Physical Exam  BP (!) 176/84 (BP Location: Left Arm)   Pulse 70   Temp 98.6 F (37 C) (Oral)   Resp 14   SpO2 99%  Gen:   Awake, no distress   Resp:  Normal effort  MSK:   Moves extremities without difficulty  Other:  Tender to palpation of left lower quadrant.  No peritoneal signs. Normoactive bowel sounds  Medical Decision Making  Medically screening exam initiated at 5:57 PM.  Appropriate orders placed.  Mariah Lang was informed that the remainder of the evaluation will be completed by another provider, this initial triage assessment does not replace that evaluation, and the importance of remaining in the ED until their evaluation is complete.  Interpretor used.  Abdominal labs and CT ordered.   Portions of this note were generated with [employer]. Dictation errors may occur despite best attempts at proofreading.    Barrie Folk, PA-C 11/15/20 1800    Jeanell Sparrow, DO 11/15/20 2211

## 2020-11-15 NOTE — Discharge Instructions (Addendum)
-  Please head to the emergency department for further evaluation and management of your abdominal pain.  I am concerned that you have a hernia, and it is possible that a loop of bowel is stuck in the hernia.  This is a medical emergency, and must be diagnosed with imaging we do not have an urgent care, with possible surgical intervention.

## 2020-11-15 NOTE — ED Notes (Signed)
Patient is being discharged from the Urgent Care and sent to the Emergency Department via POV . Per Marin Roberts PA , patient is in need of higher level of care due to possible hernia. Patient is aware and verbalizes understanding of plan of care.  Vitals:   11/15/20 1639  BP: (!) 154/88  Pulse: 76  Resp: 18  Temp: 98.2 F (36.8 C)  SpO2: 98%

## 2020-11-16 ENCOUNTER — Emergency Department (HOSPITAL_COMMUNITY): Payer: Self-pay

## 2020-11-16 IMAGING — CT CT ABD-PELV W/ CM
3 of 5 series · 16 of 46 positions shown, 18 images · IV contrast (omnipaque)
Comparison: Pelvic ultrasound dated [DATE]

CLINICAL DATA: Abdominal pain, uterine fibroids

EXAM:
CT ABDOMEN AND PELVIS WITH CONTRAST
TECHNIQUE: Multidetector CT imaging of the abdomen and pelvis was performed
using the standard protocol following bolus administration of
intravenous contrast.
CONTRAST:  75mL OMNIPAQUE IOHEXOL 350 MG/ML SOLN

[Series 3: abdomen 5.0 · axial · 0.88mm/px · z∈[-377,-17]mm · 12 of 86 slices shown, 14 images]
[im 7/86  soft-tissue]
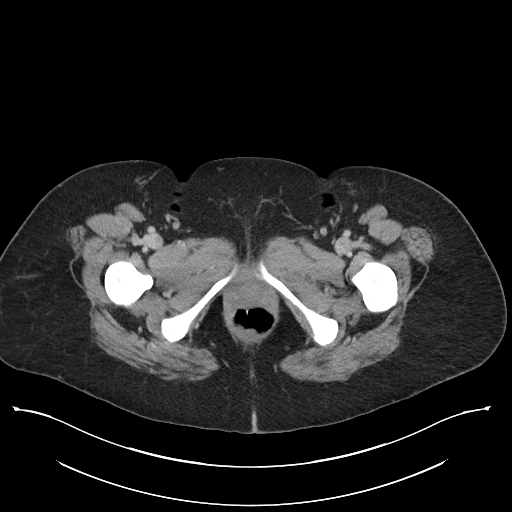
[im 7/86  bone]
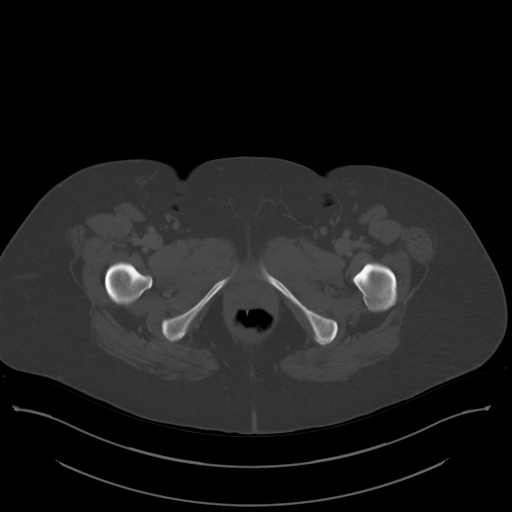
[im 14/86  soft-tissue]
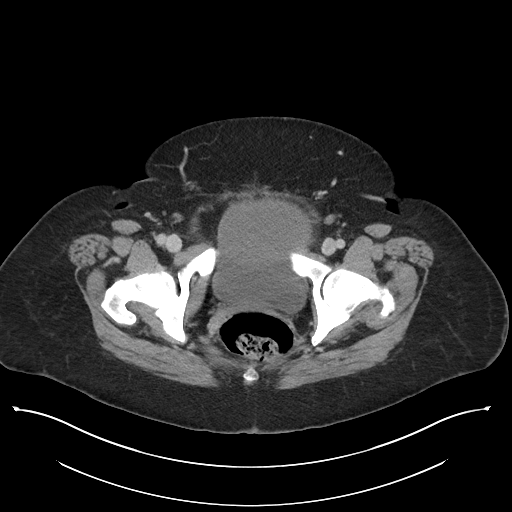
[im 20/86  soft-tissue]
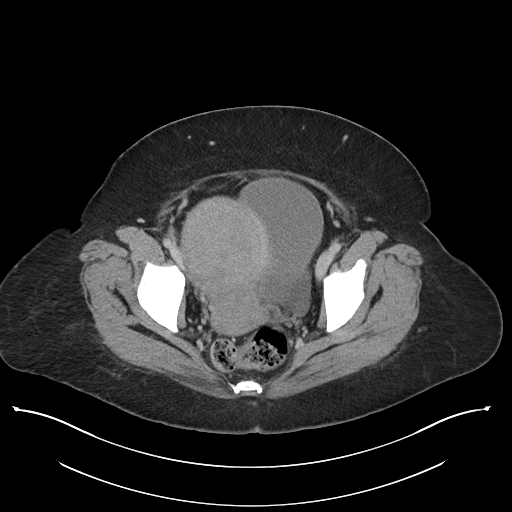
[im 27/86  soft-tissue]
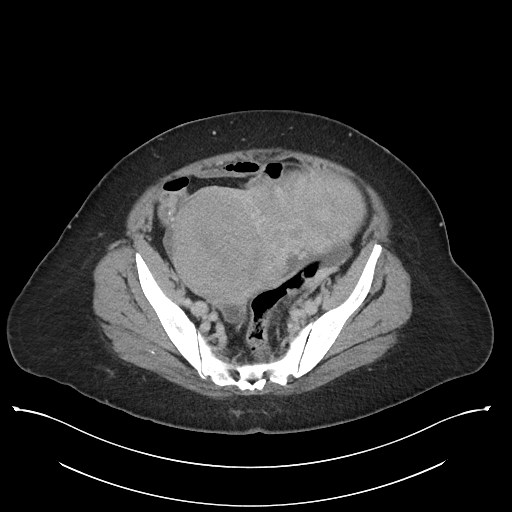
[im 33/86  soft-tissue]
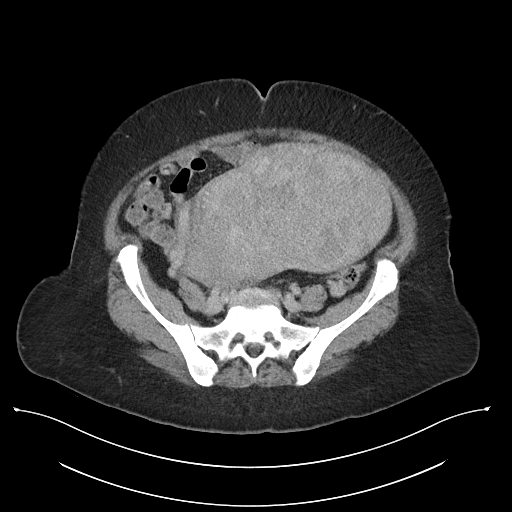
[im 40/86  soft-tissue]
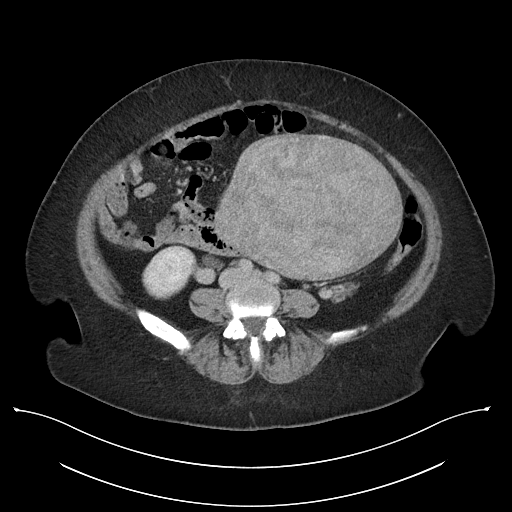
[im 46/86  soft-tissue]
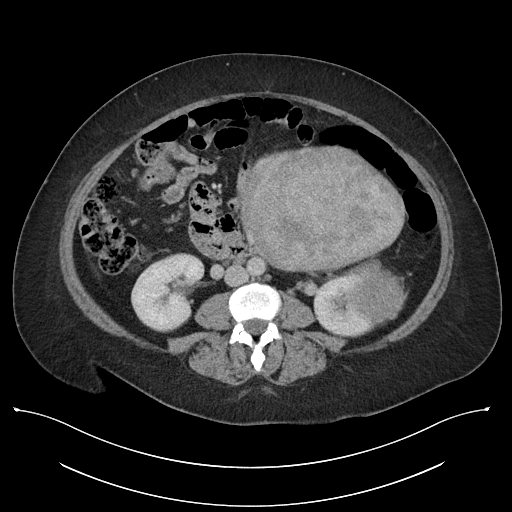
[im 53/86  soft-tissue]
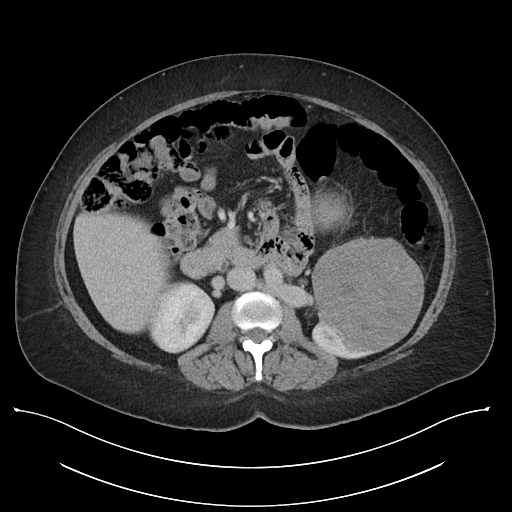
[im 59/86  soft-tissue]
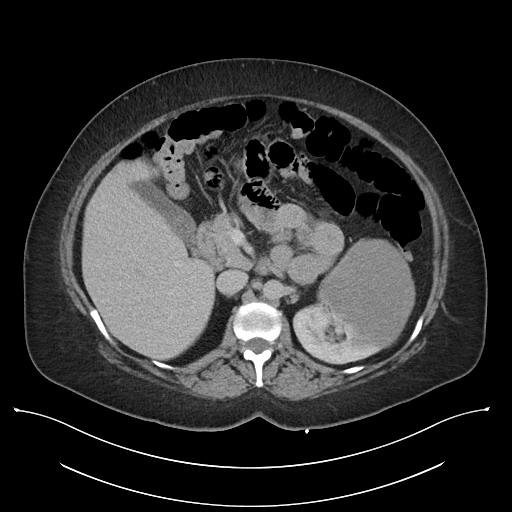
[im 59/86  bone]
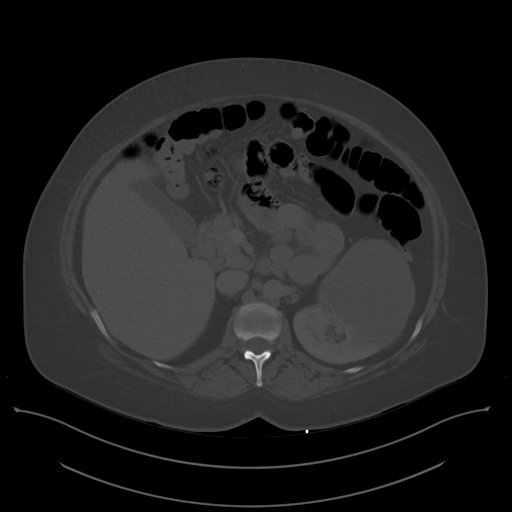
[im 66/86  soft-tissue]
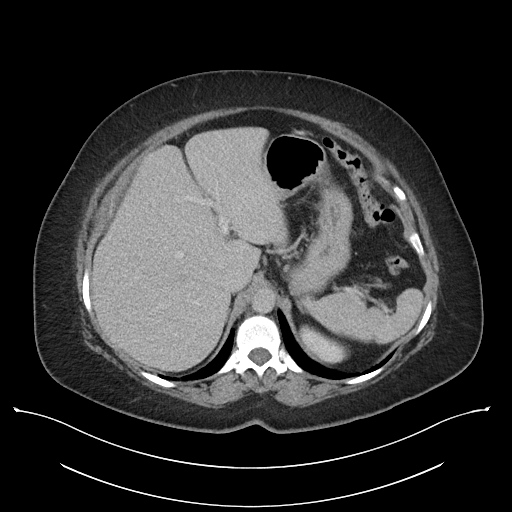
[im 72/86  soft-tissue]
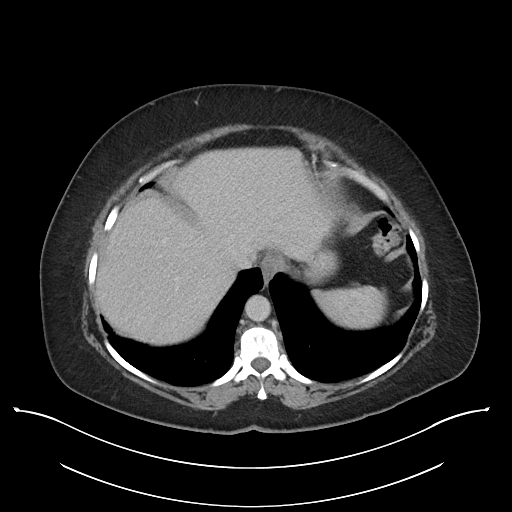
[im 79/86  soft-tissue]
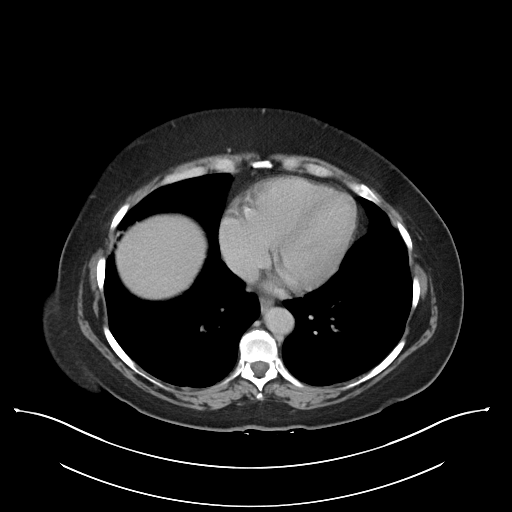

[Series 5: lung · axial · 0.88mm/px · 1 of 56 slices shown]
[im 7/56  bone]
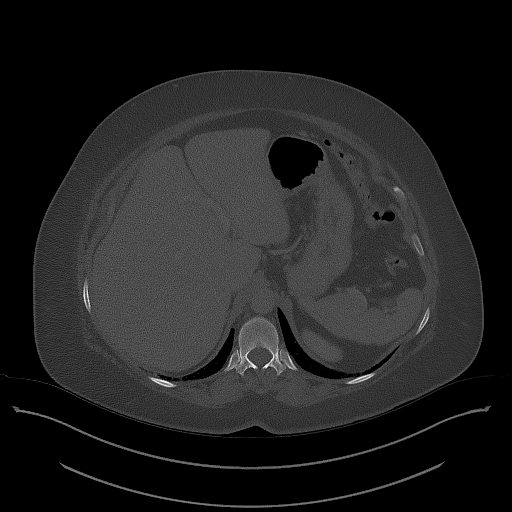

[Series 6: abdomen 3.0 mpr cor · coronal · 0.80mm/px · 3 of 113 slices shown]
[im 38/113  soft-tissue]
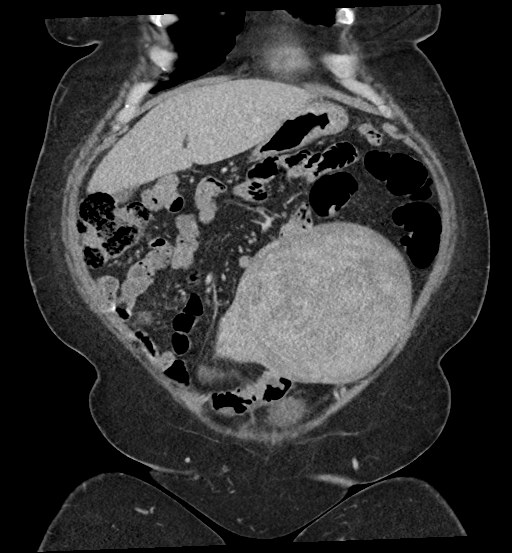
[im 50/113  soft-tissue]
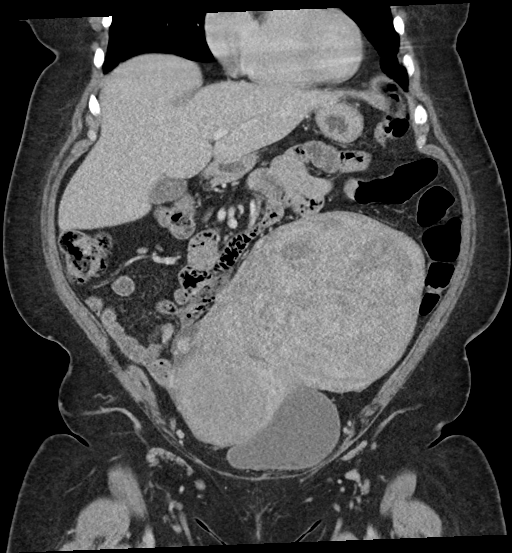
[im 63/113  soft-tissue]
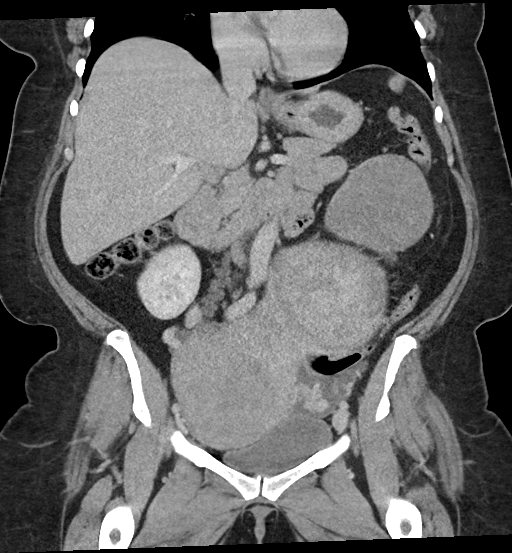

[16 of 46 positions shown; findings below may reference images not displayed]

FINDINGS: Lower chest: Lung bases are clear.

Hepatobiliary: Liver is within normal limits.

19 mm gallstone (series 3/image 29), without associated inflammatory
changes. No intrahepatic or extrahepatic ductal dilatation.

Pancreas: Within normal limits.

Spleen: Within normal limits.

Adrenals/Urinary Tract: Adrenal glands are within normal limits.

10.7 x 9.7 x 9.1 cm enhancing solid mass in the left upper kidney
(series 3/image 33), compatible with solid renal neoplasm such as
renal cell carcinoma.

Right kidney is within normal limits.  No hydronephrosis.

Bladder is within normal limits.

Stomach/Bowel: Stomach is within normal limits.

No evidence of bowel obstruction.

Appendix is not discretely visualized.

Vascular/Lymphatic: No evidence of abdominal aortic aneurysm.

No suspicious abdominopelvic lymphadenopathy.

Reproductive: Uterine fibroids, including a dominant 14.5 cm
subserosal fibroid in the left uterine fundus (series 3/image 50).

No adnexal masses.

Other: No abdominopelvic ascites.

Musculoskeletal: Visualized osseous structures are within normal
limits.
IMPRESSION: 10.7 cm left upper pole renal mass, compatible with solid renal
neoplasm such as renal cell carcinoma.

No findings suspicious for metastatic disease.

Cholelithiasis, without associated inflammatory changes.

Uterine fibroids, including a dominant 14.5 cm subserosal fibroid in
the left uterine fundus.

## 2020-11-16 MED ORDER — IOHEXOL 350 MG/ML SOLN
75.0000 mL | Freq: Once | INTRAVENOUS | Status: AC | PRN
  Administered 2020-11-16: 75 mL via INTRAVENOUS

## 2020-11-16 NOTE — ED Provider Notes (Signed)
[facility] EMERGENCY DEPARTMENT Provider Note   CSN: [id number] Arrival date & time: 11/15/20  1744     History Chief Complaint  Patient presents with   Abdominal Pain    Mariah Lang is a 50 y.o. female.  HPI  50 year old female with past medical history of anemia, appendectomy presents to the emergency department with concern for intermittent left lower quadrant abdominal pain.  Patient is Spanish-speaking, interpreter services used.  Patient states the pain has been moderate in severity, intermittent, relieved with ibuprofen.  She denies any associated genitourinary symptoms, diarrhea, nausea/vomiting.  No recent fever.  She was sent here from urgent care for further evaluation of abdominal pain.  Past Medical History:  Diagnosis Date   Anemia     Patient Active Problem List   Diagnosis Date Noted   Ankle edema, bilateral 09/14/2020   Vitamin D deficiency 09/14/2020   Thrombocytosis 05/11/2019   Language barrier 12/31/2018   Screening breast examination 12/04/2018   Fibroid uterus 12/18/2014   Menorrhagia, premenopausal 11/24/2014   Iron deficiency anemia 11/24/2014   Iron deficiency anemia due to chronic blood loss 09/02/2014   Pap smear for cervical cancer screening 09/02/2014   Annual physical exam 09/02/2014   Dysmenorrhea 02/15/2013   Anemia due to Dysmenorrhea 02/15/2013    Past Surgical History:  Procedure Laterality Date   APPENDECTOMY     CESAREAN SECTION  2000   OTHER SURGICAL HISTORY  1993   c-section     OB History     Gravida  2   Para  2   Term  2   Preterm  0   AB  0   Living  2      SAB  0   IAB  0   Ectopic  0   Multiple      Live Births  2           Family History  Problem Relation Age of Onset   Diabetes Mother    Hypertension Mother    Diabetes Paternal Grandmother     Social History   Tobacco Use   Smoking status: Never   Smokeless tobacco: Never  Vaping Use    Vaping Use: Never used  Substance Use Topics   Alcohol use: No   Drug use: No    Home Medications Prior to Admission medications   Medication Sig Start Date End Date Taking? Authorizing Provider  Calcium Carbonate-Vit D-Min (CALCIUM 1200) 1200-1000 MG-UNIT CHEW Chew 1 tablet by mouth daily. Patient not taking: No sig reported 12/31/18   Aletha Halim, MD  cholecalciferol (VITAMIN D3) 25 MCG (1000 UT) tablet Take 1 tablet (1,000 Units total) by mouth daily. Patient not taking: No sig reported 12/31/18   Aletha Halim, MD  Vitamin D, Ergocalciferol, (DRISDOL) 1.25 MG (50000 UNIT) CAPS capsule Take 1 capsule (50,000 Units total) by mouth every 7 (seven) days. 09/15/20   Mayers, Cari S, PA-C    Allergies    Patient has no known allergies.  Review of Systems   Review of Systems  Constitutional:  Negative for chills and fever.  HENT:  Negative for congestion.   Eyes:  Negative for visual disturbance.  Respiratory:  Negative for shortness of breath.   Cardiovascular:  Negative for chest pain.  Gastrointestinal:  Positive for abdominal pain. Negative for diarrhea, nausea and vomiting.  Genitourinary:  Negative for difficulty urinating, dysuria, flank pain, vaginal bleeding and vaginal discharge.  Musculoskeletal:  Negative for back pain.  Skin:  Negative for rash.  Neurological:  Negative for headaches.   Physical Exam Updated Vital Signs BP (!) 116/56   Pulse (!) 51   Temp 98 F (36.7 C) (Oral)   Resp 15   SpO2 97%   Physical Exam Vitals and nursing note reviewed.  Constitutional:      General: She is not in acute distress.    Appearance: Normal appearance.  HENT:     Head: Normocephalic.     Mouth/Throat:     Mouth: Mucous membranes are moist.  Cardiovascular:     Rate and Rhythm: Normal rate.  Pulmonary:     Effort: Pulmonary effort is normal. No respiratory distress.  Abdominal:     General: Bowel sounds are normal.     Palpations: Abdomen is soft.      Tenderness: There is no abdominal tenderness. There is no guarding or rebound.  Skin:    General: Skin is warm.     Comments: Small palpable assumed lipoma in the left mid back without overlying skin changes, nontender  Neurological:     Mental Status: She is alert and oriented to person, place, and time. Mental status is at baseline.  Psychiatric:        Mood and Affect: Mood normal.    ED Results / Procedures / Treatments   Labs (all labs ordered are listed, but only abnormal results are displayed) Labs Reviewed  CBC WITH DIFFERENTIAL/PLATELET - Abnormal; Notable for the following components:      Result Value   MCH 25.9 (*)    Platelets 465 (*)    All other components within normal limits  COMPREHENSIVE METABOLIC PANEL - Abnormal; Notable for the following components:   Calcium 8.6 (*)    All other components within normal limits  LIPASE, BLOOD    EKG None  Radiology CT ABDOMEN PELVIS W CONTRAST  Result Date: 11/16/2020 CLINICAL DATA:  Abdominal pain, uterine fibroids EXAM: CT ABDOMEN AND PELVIS WITH CONTRAST TECHNIQUE: Multidetector CT imaging of the abdomen and pelvis was performed using the standard protocol following bolus administration of intravenous contrast. CONTRAST:  31m OMNIPAQUE IOHEXOL 350 MG/ML SOLN COMPARISON:  Pelvic ultrasound dated 06/27/2018 FINDINGS: Lower chest: Lung bases are clear. Hepatobiliary: Liver is within normal limits. 19 mm gallstone (series 3/image 29), without associated inflammatory changes. No intrahepatic or extrahepatic ductal dilatation. Pancreas: Within normal limits. Spleen: Within normal limits. Adrenals/Urinary Tract: Adrenal glands are within normal limits. 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 Right kidney is within normal limits.  No hydronephrosis. Bladder is within normal limits. Stomach/Bowel: Stomach is within normal  limits. No evidence of bowel obstruction. Appendix is not discretely visualized. Vascular/Lymphatic: No evidence of abdominal aortic aneurysm. No suspicious abdominopelvic lymphadenopathy. Reproductive: Uterine fibroids, including a dominant 14.5 cm subserosal fibroid in the left uterine fundus (series 3/image 50). No adnexal masses. Other: No abdominopelvic ascites. Musculoskeletal: Visualized osseous structures are within normal limits. 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 No findings suspicious for metastatic disease. Cholelithiasis, without associated inflammatory changes. Uterine fibroids, including a dominant 14.5 cm subserosal fibroid in the left uterine fundus. Electronically Signed   By: SJulian HyM.D.   On: 11/16/2020 01:23    Procedures Procedures   Medications Ordered in ED Medications  iohexol (OMNIPAQUE) 350 MG/ML injection 75 mL (75  mLs Intravenous Contrast Given 11/16/20 0047)    ED Course  I have reviewed the triage vital signs and the nursing notes.  Pertinent labs & imaging results that were available during my care of the patient were reviewed by me and considered in my medical decision making (see chart for details).    MDM Rules/Calculators/A&P                           50 year old female presents the emergency department with concern for intermittent left lower quadrant abdominal pain.  Currently relieved.  Patient is Spanish-speaking and interpreter services used.  Vitals are normal.  Blood work is unremarkable.  CT of the abdomen pelvis shows a left sided fibroid that could be the source of her discomfort.  No other GI abnormality.  Of note the CAT scan also identified a left renal mass suspicious for renal cell carcinoma.  No signs of metastatic disease.  I spoke with the patient in depth about this finding and the need for emergent outpatient follow-up and biopsy with referral to  oncology.  I placed ambulatory referral to urology, she has an appointment with her primary doctor tomorrow.  I have placed these instructions specifically on her discharge papers.  All questions were answered with the interpreter service.  Patient at this time appears safe and stable for discharge and will be treated as an outpatient.  Discharge plan and strict return to ED precautions discussed, patient verbalizes understanding and agreement.  Final Clinical Impression(s) / ED Diagnoses Final diagnoses:  Fibroid  Renal mass of unknown nature    Rx / DC Orders ED Discharge Orders          Ordered    Ambulatory referral to Urology        11/16/20 [place], Martin, DO 11/16/20 1112

## 2020-11-16 NOTE — Discharge Instructions (Addendum)
You have been seen and discharged from the emergency department.  Your CAT scan showed no hernias.  It did show a left-sided fibroid which could be the cause of your discomfort.  I believe the swelling in your back is a small fluid-filled cyst that does not require any emergent treatment.  The scan also found a left kidney mass that needs to be evaluated as soon as possible with a biopsy for diagnosis.  A urology referral has been placed.  Call your primary doctor today for reevaluation and they may be able to schedule your biopsy as well. Take home medications as prescribed. If you have any worsening symptoms or further concerns for your health please return to an emergency department for further evaluation.

## 2020-11-17 ENCOUNTER — Other Ambulatory Visit: Payer: Self-pay

## 2020-11-17 ENCOUNTER — Ambulatory Visit: Payer: Self-pay | Attending: Family Medicine | Admitting: Family Medicine

## 2020-11-17 DIAGNOSIS — M722 Plantar fascial fibromatosis: Secondary | ICD-10-CM

## 2020-11-17 DIAGNOSIS — R29898 Other symptoms and signs involving the musculoskeletal system: Secondary | ICD-10-CM

## 2020-11-17 MED ORDER — MELOXICAM 7.5 MG PO TABS
7.5000 mg | ORAL_TABLET | Freq: Every day | ORAL | 1 refills | Status: DC
  Filled 2020-11-17: qty 30, 30d supply, fill #0
  Filled 2020-12-28: qty 30, 30d supply, fill #1

## 2020-11-17 NOTE — Progress Notes (Signed)
Virtual Visit via Telephone Note  I connected with Mariah Lang, on 11/17/2020 at 4:09 PM by telephone due to the COVID-19 pandemic and verified that I am speaking with the correct person using two identifiers.   Consent: I discussed the limitations, risks, security and privacy concerns of performing an evaluation and management service by telephone and the availability of in person appointments. I also discussed with the patient that there may be a patient responsible charge related to this service. The patient expressed understanding and agreed to proceed.   Location of Patient: Home  Location of Provider: Clinic   Persons participating in Telemedicine visit: Nyara Border Dr. Montrice Rana     History of Present Illness: Mariah Lang is a 50 y.o. year old female seen today for an office visit.    Complains of 1 year history of weakness in her legs and difficulty arising from a sitting position.She also has pain in her feet, heels, calves. States she was seen at a different Clinic where she received Vitamin D supplements. She sometimes has to hold unto support when standing due to pain in her feet. Reviewed notes from the mobile medicine unit which reveals she was treatment for edema. Past Medical History:  Diagnosis Date   Anemia    No Known Allergies  Current Outpatient Medications on File Prior to Visit  Medication Sig Dispense Refill   Calcium Carbonate-Vit D-Min (CALCIUM 1200) 1200-1000 MG-UNIT CHEW Chew 1 tablet by mouth daily. (Patient not taking: No sig reported) 30 tablet    cholecalciferol (VITAMIN D3) 25 MCG (1000 UT) tablet Take 1 tablet (1,000 Units total) by mouth daily. (Patient not taking: No sig reported)     Vitamin D, Ergocalciferol, (DRISDOL) 1.25 MG (50000 UNIT) CAPS capsule Take 1 capsule (50,000 Units total) by mouth every 7 (seven) days. 4 capsule 2   No current facility-administered medications  on file prior to visit.    ROS: See HPI  Observations/Objective: Awake, alert, oriented x3 Not in acute distress Normal mood    CMP Latest Ref Rng & Units 11/15/2020 09/14/2020 05/22/2019  Glucose 70 - 99 mg/dL 89 90 80  BUN 6 - 20 mg/dL '13 9 10  '$ Creatinine 0.44 - 1.00 mg/dL 0.79 0.54(L) 0.63  Sodium 135 - 145 mmol/L 139 138 139  Potassium 3.5 - 5.1 mmol/L 3.9 4.8 4.0  Chloride 98 - 111 mmol/L 109 103 109  CO2 22 - 32 mmol/L 25 - 23  Calcium 8.9 - 10.3 mg/dL 8.6(L) 9.9 9.0  Total Protein 6.5 - 8.1 g/dL 6.8 7.8 7.8  Total Bilirubin 0.3 - 1.2 mg/dL 0.5 0.3 0.3  Alkaline Phos 38 - 126 U/L 52 73 57  AST 15 - 41 U/L '26 18 16  '$ ALT 0 - 44 U/L 22 - 13    Lipid Panel     Component Value Date/Time   CHOL 189 09/14/2020 1011   TRIG 190 (H) 09/14/2020 1011   HDL 46 09/14/2020 1011   CHOLHDL 4.1 09/14/2020 1011   CHOLHDL 3.3 09/02/2014 1111   VLDL 14 09/02/2014 1111   LDLCALC 110 (H) 09/14/2020 1011   LABVLDL 33 09/14/2020 1011    Lab Results  Component Value Date   HGBA1C 5.7 (A) 09/14/2020    Assessment and Plan: 1. Weakness of both legs Unclear etiology Could be related to Feet pain Will treat with Meloxicam If persisting, refer to PT  2. Plantar fasciitis Will place on NSAID and reassess at next  visit for improvement. - meloxicam (MOBIC) 7.5 MG tablet; Take 1 tablet (7.5 mg total) by mouth daily.  Dispense: 30 tablet; Refill: 1   Follow Up Instructions: ! month   I discussed the assessment and treatment plan with the patient. The patient was provided an opportunity to ask questions and all were answered. The patient agreed with the plan and demonstrated an understanding of the instructions.   The patient was advised to call back or seek an in-person evaluation if the symptoms worsen or if the condition fails to improve as anticipated.     I provided 13 minutes total of non-face-to-face time during this encounter.   Charlott Rakes, MD, FAAFP. [facility] [place], [place]   11/17/2020, 4:09 PM

## 2020-11-19 ENCOUNTER — Encounter: Payer: Self-pay | Admitting: Family Medicine

## 2020-11-22 NOTE — Progress Notes (Signed)
11/25/20 4:16 PM   Mariah Lang 08-24-1970 [id number]  Referring provider:  Lorelle Gibbs, DO [address],  [place] [address] Chief Complaint  Patient presents with   renal mass     HPI: Mariah Lang is a 51 y.o.female who presents today for further evaluation of incidental left renal mass.  She was seen in the ED 11/15/2020 for intermittent left lower quadrant abdominal pain. She states the pais was moderate in severity, intermittent, and relieved with ibuprofen. She was sent from urgent care for further evaluation of abdominal pain. CT abdomen and pelvis with contrast revealed 10.7 cm left upper pole renal mass, compatible with solid renal neoplasm such as renal cell carcinoma. No findings were suspicious for metastatic disease. Additionally, uterine fibroids, including a dominant 14.5 cm subserosal fibroid in the left uterine fundus was identified.  UA showed trace ketones, moderate Hgb urine dipstick, and 30! Protein.   She is accompanied by a [place] interpreter.   She states today she has been having discomfort for the past 2 weeks that starts in right umbilical region and that radiates to left flank and lower quadrant . She has seen blood when wiping but not in urine.  Her OB/GYN provider is Dr. Ilda Basset in [place].  Initially, he was treating her for abnormal uterine bleeding with an injectable medication but she was no longer able to afford this and stopped using it.  She states she has been trying to lose weight through a no meat diet and has lost 20 lbs. she denies any unintentional weight loss.  PMH: Past Medical History:  Diagnosis Date   Anemia     Surgical History: Past Surgical History:  Procedure Laterality Date   APPENDECTOMY     CESAREAN SECTION  2000   OTHER SURGICAL HISTORY  1993   c-section    Home Medications:  Allergies as of 11/25/2020   No Known Allergies      Medication List         Accurate as of November 25, 2020  4:16 PM. If you have any questions, ask your nurse or doctor.          Calcium 1200 1200-1000 MG-UNIT Chew Chew 1 tablet by mouth daily.   cholecalciferol 25 MCG (1000 UNIT) tablet Commonly known as: VITAMIN D3 Take 1 tablet (1,000 Units total) by mouth daily.   meloxicam 7.5 MG tablet Commonly known as: MOBIC Tome 1 tableta (7.5 mg en total) por va oral diariamente. (Take 1 tablet (7.5 mg total) by mouth daily.)   Vitamin D (Ergocalciferol) 1.25 MG (50000 UNIT) Caps capsule Commonly known as: DRISDOL Take 1 capsule (50,000 Units total) by mouth every 7 (seven) days.        Allergies: No Known Allergies  Family History: Family History  Problem Relation Age of Onset   Diabetes Mother    Hypertension Mother    Diabetes Paternal Grandmother     Social History:  reports that she has never smoked. She has never used smokeless tobacco. She reports that she does not drink alcohol and does not use drugs.   Physical Exam: BP 124/78   Pulse 74   Ht 5' (1.524 m)   Wt 185 lb (83.9 kg)   BMI 36.13 kg/m   Constitutional:  Alert and oriented, No acute distress. HEENT: [place] AT, moist mucus membranes.  Trachea midline, no masses. Cardiovascular: No clubbing, cyanosis, or edema. Respiratory: Normal respiratory effort, no increased work of breathing.  Skin: No rashes, bruises or suspicious lesions. Neurologic: Grossly intact, no focal deficits, moving all 4 extremities. Psychiatric: Normal mood and affect.  Laboratory Data:  Lab Results  Component Value Date   CREATININE 0.79 11/15/2020    Lab Results  Component Value Date   HGBA1C 5.7 (A) 09/14/2020    Pertinent Imaging: CLINICAL DATA:  Abdominal pain, uterine fibroids   EXAM: CT ABDOMEN AND PELVIS WITH CONTRAST   TECHNIQUE: Multidetector CT imaging of the abdomen and pelvis was performed using the standard protocol following bolus administration of intravenous contrast.    CONTRAST:  68m OMNIPAQUE IOHEXOL 350 MG/ML SOLN   COMPARISON:  Pelvic ultrasound dated 06/27/2018   FINDINGS: Lower chest: Lung bases are clear.   Hepatobiliary: Liver is within normal limits.   19 mm gallstone (series 3/image 29), without associated inflammatory changes. No intrahepatic or extrahepatic ductal dilatation.   Pancreas: Within normal limits.   Spleen: Within normal limits.   Adrenals/Urinary Tract: Adrenal glands are within normal limits.   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   Right kidney is within normal limits.  No hydronephrosis.   Bladder is within normal limits.   Stomach/Bowel: Stomach is within normal limits.   No evidence of bowel obstruction.   Appendix is not discretely visualized.   Vascular/Lymphatic: No evidence of abdominal aortic aneurysm.   No suspicious abdominopelvic lymphadenopathy.   Reproductive: Uterine fibroids, including a dominant 14.5 cm subserosal fibroid in the left uterine fundus (series 3/image 50).   No adnexal masses.   Other: No abdominopelvic ascites.   Musculoskeletal: Visualized osseous structures are within normal limits.   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   No findings suspicious for metastatic disease.   Cholelithiasis, without associated inflammatory changes.   Uterine fibroids, including a dominant 14.5 cm subserosal fibroid in the left uterine fundus.     Electronically Signed   By: SJulian HyM.D.   On: 11/16/2020 01:23  The above CT scan was personally reviewed, agree with radiologic interpretation.  There is a solid renal mass however the enhancement is present but minimal.  She has extremely large uterus which occupy space adjacent to the lower pole of the kidney.   Assessment & Plan:     Left renal mass - 10 + cm renal mass concern for possible underline mallignancy.   - MRI of kidney with and without for further characterization based on its appearance with only minimal call contrast enhancement; she is agreeable this is this plan.  If there is any question, she may benefit from a renal mass biopsy.  -  Chest CT  for medistatic evaluation; recent CMP is unremarkable  -She understands that if in fact this is consistent with renal cell carcinoma, she will need a nephrectomy.  Based on the location and size of the tumor, does not appear to be amenable to partial nephrectomy.  She understands that this is a surgical problem.  This is also complicated by #2, see below  2. Uterine fibroid  - reached out to Dr. STheora Gianottito review imaging she has very large uterus and fibroids; these in fact are likely the source of her abdominal pain from her umbilicus to her left lower quadrant rather than her kidney, she also has a history of abnormal uterine bleeding  -If radical  radical nephrectomy is warranted, she may benefit from combined case with GU and GYN if it is deemed possible via minimally invasive approach.   - Dr. Theora Gianotti has agrees to evaluate patient and will be scheduling follow-up   Follow-up in 4-weeks with chest CT and MRI  Conley Rolls as a scribe for Hollice Espy, MD.,have documented all relevant documentation on the behalf of Hollice Espy, MD,as directed by  Hollice Espy, MD while in the presence of Hollice Espy, MD.  I have reviewed the above documentation for accuracy and completeness, and I agree with the above.   Hollice Espy, MD   [facility] [address], [place], [place] [address] [phone]  I spent 60 total minutes on the day of the encounter including pre-visit review of the medical record, face-to-face time with the patient, and post visit ordering of labs/imaging/tests.

## 2020-11-25 ENCOUNTER — Other Ambulatory Visit: Payer: Self-pay

## 2020-11-25 ENCOUNTER — Encounter: Payer: Self-pay | Admitting: Urology

## 2020-11-25 ENCOUNTER — Ambulatory Visit (INDEPENDENT_AMBULATORY_CARE_PROVIDER_SITE_OTHER): Payer: Self-pay | Admitting: Urology

## 2020-11-25 VITALS — BP 124/78 | HR 74 | Ht 60.0 in | Wt 185.0 lb

## 2020-11-25 DIAGNOSIS — N2889 Other specified disorders of kidney and ureter: Secondary | ICD-10-CM

## 2020-11-25 LAB — MICROSCOPIC EXAMINATION
Bacteria, UA: NONE SEEN
WBC, UA: NONE SEEN /hpf (ref 0–5)

## 2020-11-25 LAB — URINALYSIS, COMPLETE
Bilirubin, UA: NEGATIVE
Glucose, UA: NEGATIVE
Ketones, UA: NEGATIVE
Leukocytes,UA: NEGATIVE
Nitrite, UA: NEGATIVE
Protein,UA: NEGATIVE
Specific Gravity, UA: <1.005 — ABNORMAL LOW (ref 1.005–1.030)
Urobilinogen, Ur: 0.2 mg/dL (ref 0.2–1.0)
pH, UA: 6 (ref 5.0–7.5)

## 2020-11-30 ENCOUNTER — Other Ambulatory Visit: Payer: Self-pay

## 2020-11-30 ENCOUNTER — Telehealth: Payer: Self-pay | Admitting: *Deleted

## 2020-11-30 LAB — CULTURE, URINE COMPREHENSIVE

## 2020-11-30 MED ORDER — SULFAMETHOXAZOLE-TRIMETHOPRIM 800-160 MG PO TABS
1.0000 | ORAL_TABLET | Freq: Two times a day (BID) | ORAL | 0 refills | Status: DC
  Filled 2020-11-30: qty 10, 5d supply, fill #0

## 2020-11-30 NOTE — Telephone Encounter (Addendum)
Patient informed via interpreter services-sent in RX to pharmacy.  Voiced understanding   ----- Message from Hollice Espy, MD sent at 11/30/2020  8:37 AM EDT ----- Urine culture grew Klebsiella.  At that to treat this with Bactrim DS twice daily for 5 days.  Hollice Espy, MD

## 2020-12-02 ENCOUNTER — Inpatient Hospital Stay: Payer: Self-pay

## 2020-12-02 ENCOUNTER — Other Ambulatory Visit: Payer: Self-pay

## 2020-12-02 ENCOUNTER — Inpatient Hospital Stay: Payer: Self-pay | Attending: Obstetrics and Gynecology | Admitting: Obstetrics and Gynecology

## 2020-12-02 VITALS — BP 135/85 | HR 75 | Temp 98.7°F | Resp 20 | Wt 185.3 lb

## 2020-12-02 DIAGNOSIS — E669 Obesity, unspecified: Secondary | ICD-10-CM | POA: Insufficient documentation

## 2020-12-02 DIAGNOSIS — Z3202 Encounter for pregnancy test, result negative: Secondary | ICD-10-CM | POA: Insufficient documentation

## 2020-12-02 DIAGNOSIS — Z6836 Body mass index (BMI) 36.0-36.9, adult: Secondary | ICD-10-CM | POA: Insufficient documentation

## 2020-12-02 DIAGNOSIS — D259 Leiomyoma of uterus, unspecified: Secondary | ICD-10-CM

## 2020-12-02 DIAGNOSIS — E559 Vitamin D deficiency, unspecified: Secondary | ICD-10-CM | POA: Insufficient documentation

## 2020-12-02 DIAGNOSIS — N939 Abnormal uterine and vaginal bleeding, unspecified: Secondary | ICD-10-CM | POA: Insufficient documentation

## 2020-12-02 DIAGNOSIS — Z124 Encounter for screening for malignant neoplasm of cervix: Secondary | ICD-10-CM | POA: Insufficient documentation

## 2020-12-02 DIAGNOSIS — D252 Subserosal leiomyoma of uterus: Secondary | ICD-10-CM | POA: Insufficient documentation

## 2020-12-02 DIAGNOSIS — D4100 Neoplasm of uncertain behavior of unspecified kidney: Secondary | ICD-10-CM | POA: Insufficient documentation

## 2020-12-02 LAB — PREGNANCY, URINE: Preg Test, Ur: NEGATIVE

## 2020-12-02 MED ORDER — ACETAMINOPHEN 325 MG PO TABS
650.0000 mg | ORAL_TABLET | Freq: Once | ORAL | Status: AC | PRN
  Administered 2020-12-02: 650 mg via ORAL

## 2020-12-02 NOTE — Progress Notes (Addendum)
Gynecologic Oncology Consult Visit   Referring Provider: Dr. Erlene Quan  Chief Complaint: renal mass and uterine fibroids  Subjective:  Mariah Lang is a 50 y.o. G2P2 female who is seen in consultation from Dr. Erlene Quan for uterine fibroids in setting of renal mass.  She states she has at least a 6-7 year history of uterine fibroids. Her fibroids are palpable above her umbilicus. In the past they were 2-4 cm and then over the last two years have grown rapidly. She does have pelvic "cramping" pain like a menstrual cycle. She has bleeding monthly but it is much less than when she had regular cycles. She does not know if she has hot flashes, but feels like she has them now due to an antibiotic she is taking. She does not recall when she had prior imaging before the CT scan.   Patient presented to ED on 11/15/20 for LLQ abdominal pain. CT revealed 10.7 cm left upper pole renal mass compatible with solid renal neoplasm such as RCC. Additionally, uterine fibroids including a dominant 14.5 cm subserosal fibroid in the left uterine fundus was identified. She has abnormal uterine bleeding which has been managed by Dr. Ilda Basset, previously receiving injections but no longer taking d/t cost. Patient saw Dr. Erlene Quan on 11/25/20 for evaluation of renal mass. Nephrectomy was recommended. Patient referred to gyn-onc to evaluate for possible combined GU and GYN surgical case and determine if minimally invasive approach is possibility.   She presents to clinic today with an interpreter.     Problem List: Patient Active Problem List   Diagnosis Date Noted   Ankle edema, bilateral 09/14/2020   Vitamin D deficiency 09/14/2020   Thrombocytosis 05/11/2019   Language barrier 12/31/2018   Screening breast examination 12/04/2018   Fibroid uterus 12/18/2014   Menorrhagia, premenopausal 11/24/2014   Iron deficiency anemia 11/24/2014   Iron deficiency anemia due to chronic blood loss 09/02/2014   Pap  smear for cervical cancer screening 09/02/2014   Annual physical exam 09/02/2014   Dysmenorrhea 02/15/2013   Anemia due to Dysmenorrhea 02/15/2013   Past Medical History: Past Medical History:  Diagnosis Date   Anemia    Past Surgical History: Past Surgical History:  Procedure Laterality Date   APPENDECTOMY     CESAREAN SECTION  2000   OTHER SURGICAL HISTORY  1993   c-section   Past Gynecologic History:  Menarche: 15 Menstrual details: irregular Menses regular: no Last Menstrual Period: 2020 History of OCP/HRT use:  History of Abnormal pap:  Last pap:  2016 History of STDs:  Contraception: no Sexually active:   OB History  Gravida Para Term Preterm AB Living  '2 2 2 '$ 0 0 2  SAB IAB Ectopic Multiple Live Births  0 0 0   2    # Outcome Date GA Lbr Len/2nd Weight Sex Delivery Anes PTL Lv  2 Term      CS-Unspec     1 Term      CS-Unspec      Family History: Family History  Problem Relation Age of Onset   Diabetes Mother    Hypertension Mother    Diabetes Paternal Grandmother    Social History: Social History   Socioeconomic History   Marital status: Single    Spouse name: Not on file   Number of children: 2   Years of education: Not on file   Highest education level: 9th grade  Occupational History   Not on file  Tobacco Use   Smoking  status: Never   Smokeless tobacco: Never  Vaping Use   Vaping Use: Never used  Substance and Sexual Activity   Alcohol use: No   Drug use: No   Sexual activity: Yes    Birth control/protection: None  Other Topics Concern   Not on file  Social History Narrative   ** Merged History Encounter **       Social Determinants of Health   Financial Resource Strain: Not on file  Food Insecurity: Not on file  Transportation Needs: Not on file  Physical Activity: Not on file  Stress: Not on file  Social Connections: Not on file  Intimate Partner Violence: Not on file   Allergies: No Known Allergies  Current  Medications: Current Outpatient Medications  Medication Sig Dispense Refill   Calcium Carbonate-Vit D-Min (CALCIUM 1200) 1200-1000 MG-UNIT CHEW Chew 1 tablet by mouth daily. 30 tablet    cholecalciferol (VITAMIN D3) 25 MCG (1000 UT) tablet Take 1 tablet (1,000 Units total) by mouth daily.     meloxicam (MOBIC) 7.5 MG tablet Take 1 tablet (7.5 mg total) by mouth daily. 30 tablet 1   sulfamethoxazole-trimethoprim (BACTRIM DS) 800-160 MG tablet Take 1 tablet by mouth 2 (two) times daily. 10 tablet 0   Vitamin D, Ergocalciferol, (DRISDOL) 1.25 MG (50000 UNIT) CAPS capsule Take 1 capsule (50,000 Units total) by mouth every 7 (seven) days. 4 capsule 2   No current facility-administered medications for this visit.   General: negative for fevers, changes in weight or night sweats Skin: negative for changes in moles or sores or rash Eyes: negative for changes in vision HEENT: negative for change in hearing, tinnitus, voice changes Pulmonary: negative for dyspnea, orthopnea, productive cough, wheezing Cardiac: negative for palpitations, pain Gastrointestinal: negative for nausea, vomiting, constipation, diarrhea, hematemesis, hematochezia Genitourinary/Sexual: negative for dysuria, retention, hematuria, incontinence Ob/Gyn:  negative for abnormal bleeding, or pain Musculoskeletal: negative for pain, joint pain, back pain Hematology: negative for easy bruising, abnormal bleeding Neurologic/Psych: negative for headaches, seizures, paralysis, weakness, numbness  Objective:  Physical Examination:  BP 135/85   Pulse 75   Temp 98.7 F (37.1 C)   Resp 20   Wt 185 lb 4.8 oz (84.1 kg)   SpO2 100%   BMI 36.19 kg/m     ECOG Performance Status: 1 - Symptomatic but completely ambulatory  GENERAL: Patient is a well appearing female in no acute distress HEENT:  PERRL, neck supple.  NODES:  No inguinal lymphadenopathy palpated.  LUNGS:  No increased respiratory effort. ABDOMEN:  Soft, nontender,  nondistended with firm 20 cm uterine mass. No ascites, or hernia.  Pfannenstiel incision healed.  MSK:  No focal spinal tenderness to palpation. Full range of motion bilaterally in the upper extremities. EXTREMITIES:  No peripheral edema.   NEURO:  Nonfocal. Well oriented.  Appropriate affect.  Pelvic: Chaperoned by CMA EGBUS: no lesions Cervix: no lesions, nontender, mobile. Os slightly stenotic. Pap obtained.  Vagina: no lesions, no discharge or bleeding Uterus: enlarged above the umbilicus with large fibroid masses extending to the right sidewall. Limited mobility. Adnexa: limited by enlarged uterus. Suspect findings on the right represent lateral uterine fibroid.  Rectovaginal: confirmatory  EMBx attempted:  The risks and benefits of the procedure were reviewed and informed consent obtained. Time out was performed. The patient received pre-procedure teaching and expressed understanding. The post-procedure instructions were reviewed with the patient and she expressed understanding. The patient does not have any barriers to learning.  Speculum placed in the vaginal vault  and cervix identified. Cervix cleansed with Betadine and anterior lip grasped with a tenaculum. The internal os was stenotic and would not allow the pipelle to advance. The os was dilated with a small dilator and the pipelle still could not be advanced. Procedure stopped. The tenaculum was removed and hemostasis obtained with AgNO3.   Post-procedure evaluation the patient was stable without complaints. Tylenol was given for pain.   Lab Review Labs on site today: - Urine Pregnancy  - Pap Smear  Radiologic Imaging: I independently reviewed below imaging and agree with findings as discussed in assessment and plan.  11/16/20 CT Abdomen Pelvis W Contrast- - 10.7 cm left upper pole renal mass, compatible with solid renal neoplasm such as renal cell carcinoma. - No findings suspicious for metastatic disease. - Cholelithiasis,  without associated inflammatory changes. - Uterine fibroids, including a dominant 14.5 cm subserosal fibroid in the left uterine fundus.         Assessment:  Nicolette Suite is a 50 y.o. female diagnosed with enlarging symptomatic uterine fibroids and renal mass. Differential for the uterine fibroids includes leiomyosarcoma given rapid enlargement over the past 2 years.   Medical co-morbidities complicating care: prior C-section x 2. Body mass index is 36.19 kg/m.  Plan:   Problem List Items Addressed This Visit       Genitourinary   Fibroid uterus   Relevant Orders   Pregnancy, urine (Completed)   MR PELVIS W WO CONTRAST     Other   Pap smear for cervical cancer screening - Primary   Relevant Orders   IGP, Aptima HPV   We discussed options for management and recommend surgical management. We do need to coordinate with Dr. Erlene Quan and she would like additional imaging with abdominal MRI to confirm the origin of the mass.   Differential for the uterine fibroids includes leiomyosarcoma given rapid enlargement over the past 2 years concerning for LMS. Unable to obtain biopsy due to cervical stenosis. Will obtain MRI pelvis.   Obtain FSH. Follow up Pap/HPV.   RTC for preop visit and consent once we have the additional testing. Recommend intraoperative stent placement for her surgery.   The patient's diagnosis, an outline of the further diagnostic and laboratory studies which will be required, the recommendation for surgery, and alternatives were discussed with her and her accompanying family members.  All questions were answered to their satisfaction.  A total of at least 80 minutes were spent with the patient/family today; >50% was spent in education, counseling and coordination of care for leiomyoma.  I personally had a face to face interaction and evaluated the patient jointly with the NP, Ms. Beckey Rutter.  I have reviewed her history and available records  and have performed all portions of the physical exam with my findings confirming those documented above by the APP.  I have discussed the case with the APP and the patient.  I agree with the above documentation, assessment and plan which was fully formulated by me.  Counseling was completed by me.   I personally saw the patient and performed a substantive portion of this encounter in conjunction with the listed APP as documented above.  Mariah Doucette Gaetana Michaelis, MD

## 2020-12-03 ENCOUNTER — Other Ambulatory Visit: Payer: Self-pay

## 2020-12-03 ENCOUNTER — Telehealth: Payer: Self-pay

## 2020-12-03 DIAGNOSIS — D75839 Thrombocytosis, unspecified: Secondary | ICD-10-CM

## 2020-12-03 DIAGNOSIS — D500 Iron deficiency anemia secondary to blood loss (chronic): Secondary | ICD-10-CM

## 2020-12-03 DIAGNOSIS — D259 Leiomyoma of uterus, unspecified: Secondary | ICD-10-CM

## 2020-12-03 NOTE — Telephone Encounter (Signed)
[employer] utilizing interpreter services. Went over MRI/CT appointments scheduled for 12/18/20 starting at 0830. She will arrive at the medical mall entrance of [place] not to eat or drink for 4 hours prior. She will come to cancer center 12/04/20 at 1415 for blood work. All questions answered.

## 2020-12-04 ENCOUNTER — Inpatient Hospital Stay: Payer: Self-pay | Attending: Obstetrics and Gynecology

## 2020-12-04 DIAGNOSIS — D259 Leiomyoma of uterus, unspecified: Secondary | ICD-10-CM

## 2020-12-04 DIAGNOSIS — D500 Iron deficiency anemia secondary to blood loss (chronic): Secondary | ICD-10-CM

## 2020-12-04 DIAGNOSIS — D75839 Thrombocytosis, unspecified: Secondary | ICD-10-CM

## 2020-12-04 LAB — CBC WITH DIFFERENTIAL/PLATELET
Abs Immature Granulocytes: 0.05 10*3/uL (ref 0.00–0.07)
Basophils Absolute: 0 10*3/uL (ref 0.0–0.1)
Basophils Relative: 0 %
Eosinophils Absolute: 0.1 10*3/uL (ref 0.0–0.5)
Eosinophils Relative: 1 %
HCT: 42.5 % (ref 36.0–46.0)
Hemoglobin: 14 g/dL (ref 12.0–15.0)
Immature Granulocytes: 1 %
Lymphocytes Relative: 30 %
Lymphs Abs: 2.8 10*3/uL (ref 0.7–4.0)
MCH: 26.1 pg (ref 26.0–34.0)
MCHC: 32.9 g/dL (ref 30.0–36.0)
MCV: 79.1 fL — ABNORMAL LOW (ref 80.0–100.0)
Monocytes Absolute: 0.8 10*3/uL (ref 0.1–1.0)
Monocytes Relative: 8 %
Neutro Abs: 5.7 10*3/uL (ref 1.7–7.7)
Neutrophils Relative %: 60 %
Platelets: 467 10*3/uL — ABNORMAL HIGH (ref 150–400)
RBC: 5.37 MIL/uL — ABNORMAL HIGH (ref 3.87–5.11)
RDW: 15.1 % (ref 11.5–15.5)
WBC: 9.5 10*3/uL (ref 4.0–10.5)
nRBC: 0 % (ref 0.0–0.2)

## 2020-12-05 LAB — IGP, APTIMA HPV: HPV Aptima: NEGATIVE

## 2020-12-05 LAB — FOLLICLE STIMULATING HORMONE: FSH: 60.5 m[IU]/mL

## 2020-12-14 ENCOUNTER — Ambulatory Visit: Payer: Self-pay | Admitting: Obstetrics and Gynecology

## 2020-12-18 ENCOUNTER — Ambulatory Visit
Admission: RE | Admit: 2020-12-18 | Discharge: 2020-12-18 | Disposition: A | Discharge status: Home / Self Care | Payer: Self-pay | Source: Ambulatory Visit | Attending: Urology | Admitting: Urology

## 2020-12-18 ENCOUNTER — Ambulatory Visit
Admission: RE | Admit: 2020-12-18 | Discharge: 2020-12-18 | Disposition: A | Discharge status: Home / Self Care | Payer: Self-pay | Source: Ambulatory Visit | Attending: Nurse Practitioner | Admitting: Nurse Practitioner

## 2020-12-18 ENCOUNTER — Other Ambulatory Visit: Payer: Self-pay

## 2020-12-18 DIAGNOSIS — D259 Leiomyoma of uterus, unspecified: Secondary | ICD-10-CM | POA: Insufficient documentation

## 2020-12-18 DIAGNOSIS — N2889 Other specified disorders of kidney and ureter: Secondary | ICD-10-CM | POA: Insufficient documentation

## 2020-12-18 IMAGING — CT CT CHEST W/O CM
2 of 3 series · 15 of 36 positions shown, 18 images · non-contrast
Comparison: None.

CLINICAL DATA: Newly diagnosed left renal neoplasm. Staging.

EXAM:
CT CHEST WITHOUT CONTRAST
TECHNIQUE: Multidetector CT imaging of the chest was performed following the
standard protocol without IV contrast.

[Series 2: thorax · axial · 0.66mm/px · z∈[-578,-338]mm · 12 of 142 slices shown, 15 images]
[im 11/142  mediastinal]
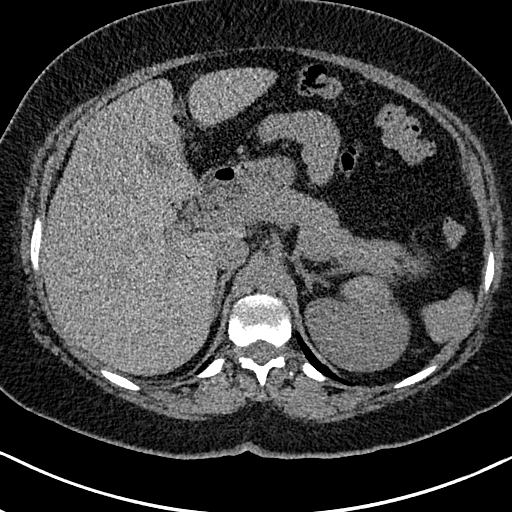
[im 11/142  lung]
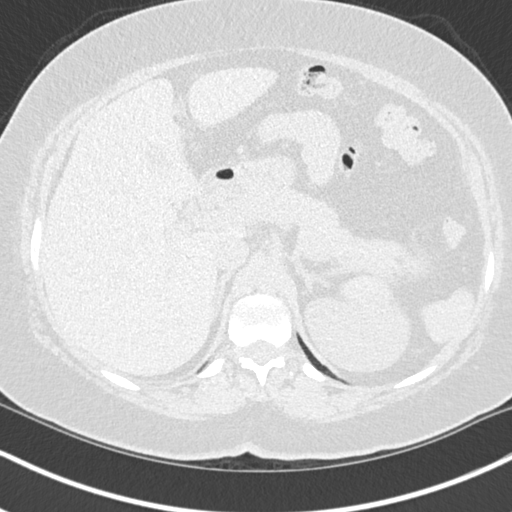
[im 21/142  lung]
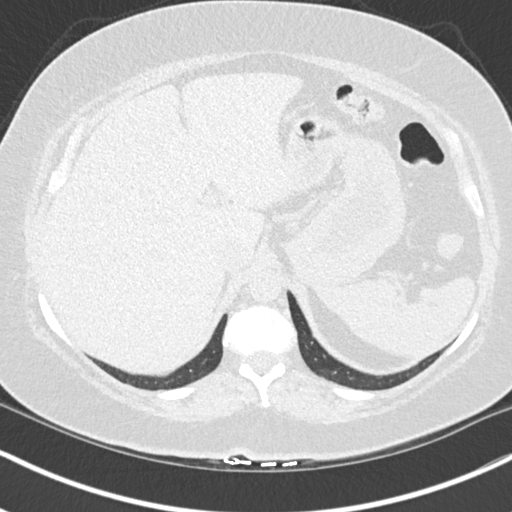
[im 32/142  lung]
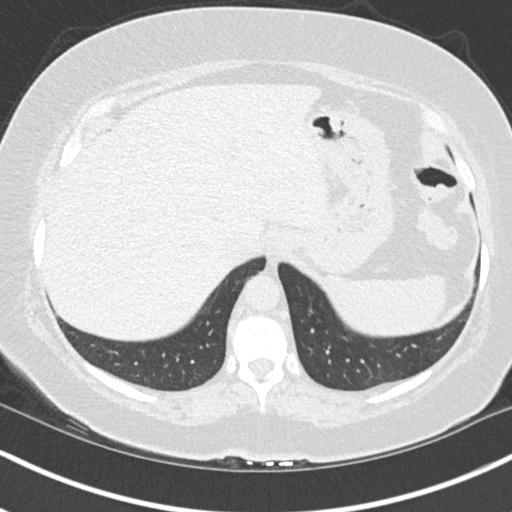
[im 42/142  lung]
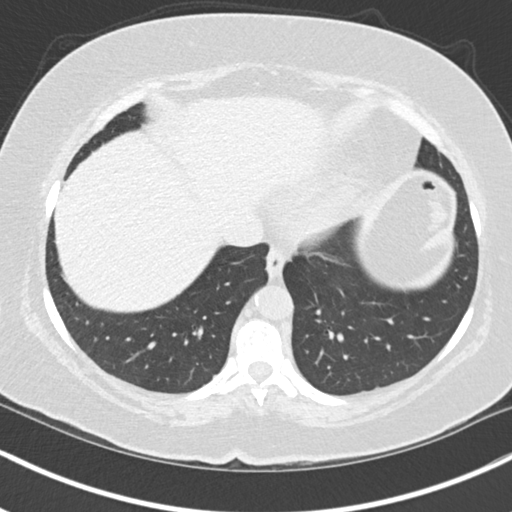
[im 53/142  mediastinal]
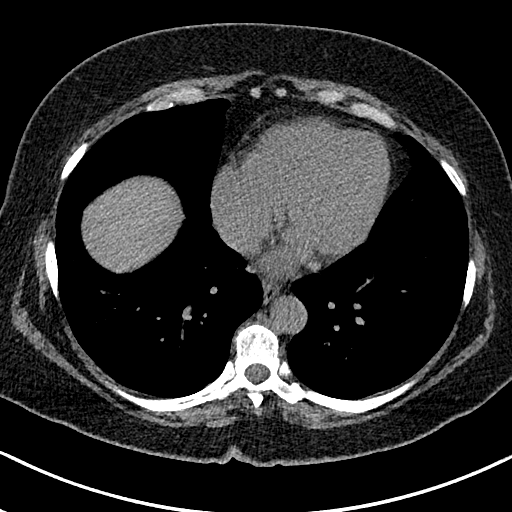
[im 53/142  lung]
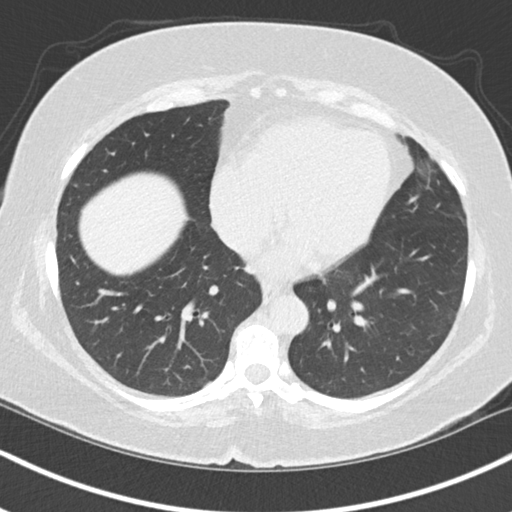
[im 63/142  lung]
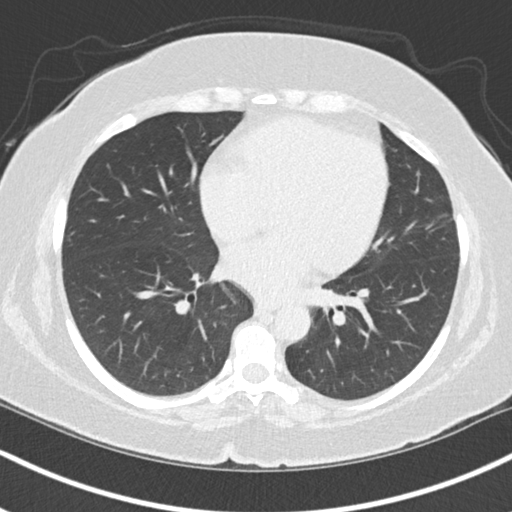
[im 79/142  lung]
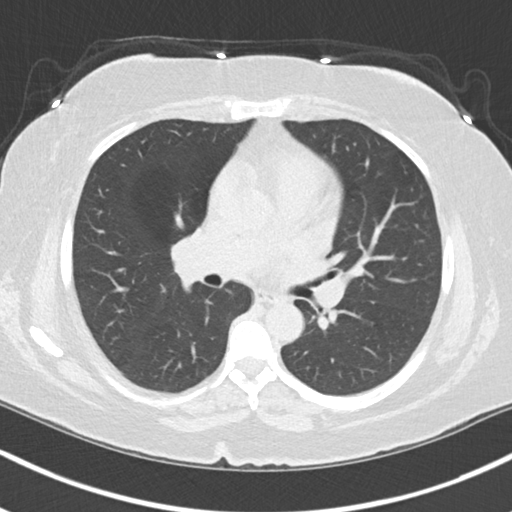
[im 89/142  lung]
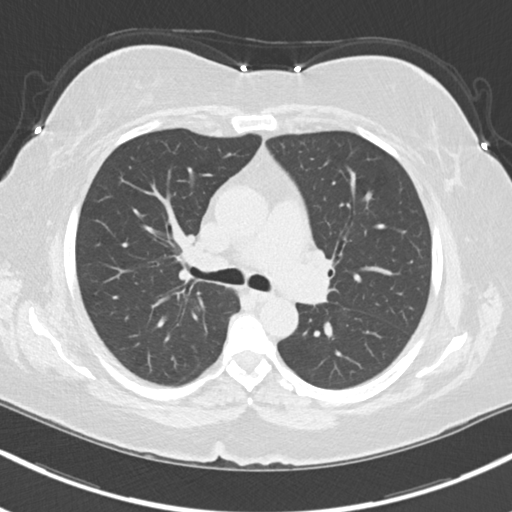
[im 100/142  mediastinal]
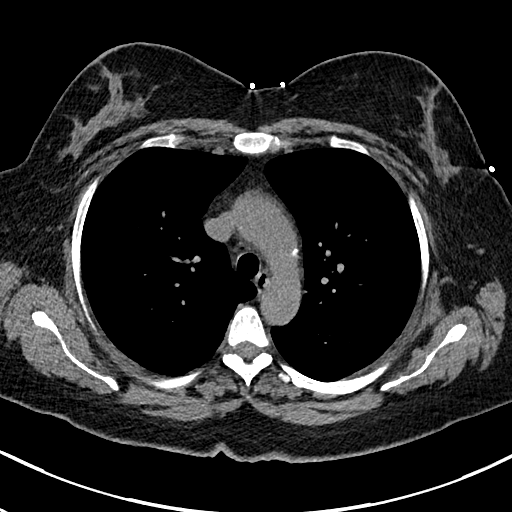
[im 100/142  lung]
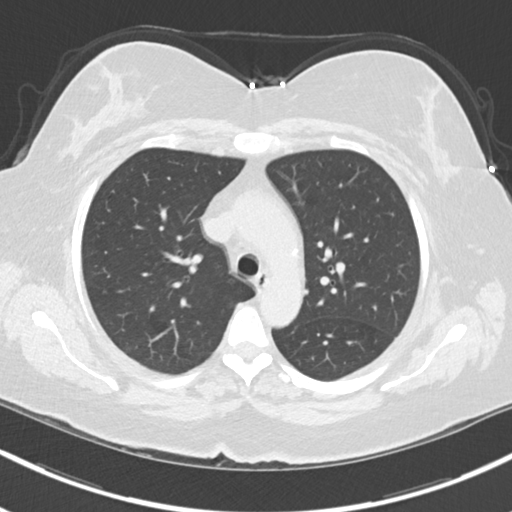
[im 110/142  lung]
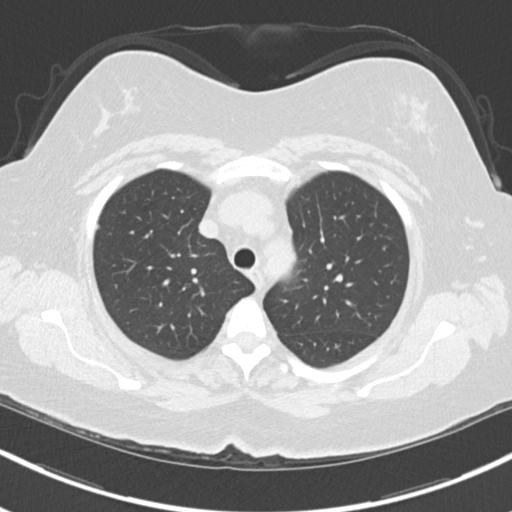
[im 121/142  lung]
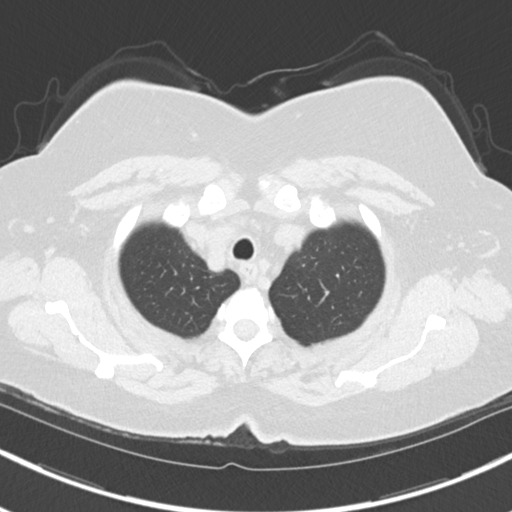
[im 131/142  lung]
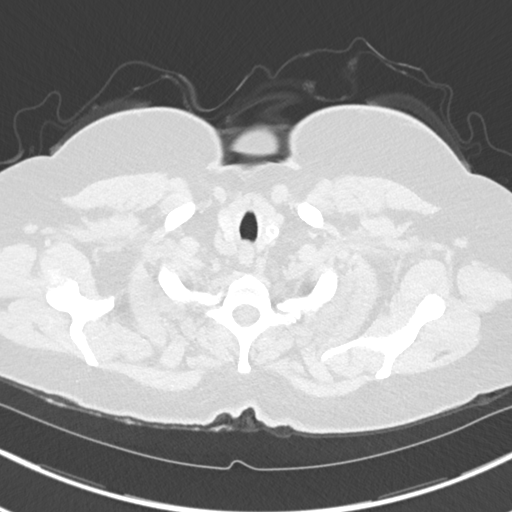

[Series 5: coronal · coronal · 0.56mm/px · 3 of 151 slices shown]
[im 31/151  lung]
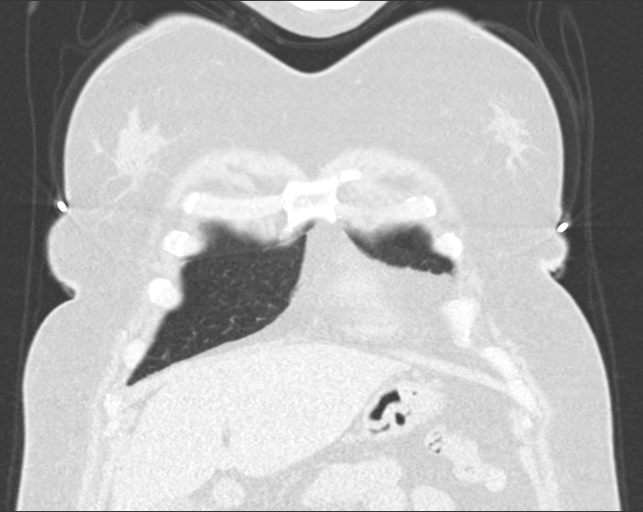
[im 61/151  lung]
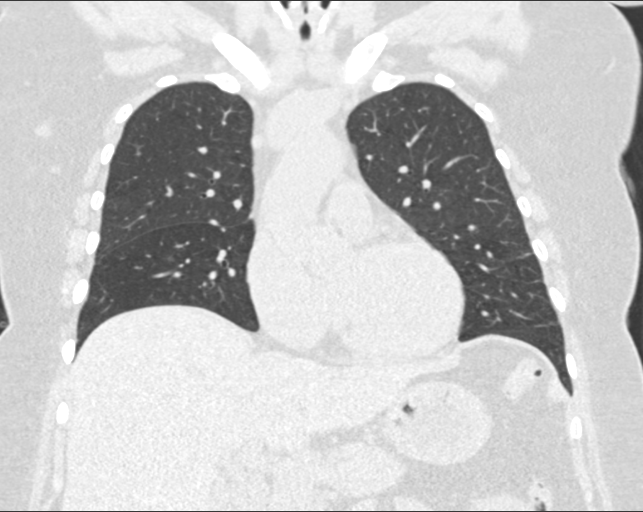
[im 91/151  lung]
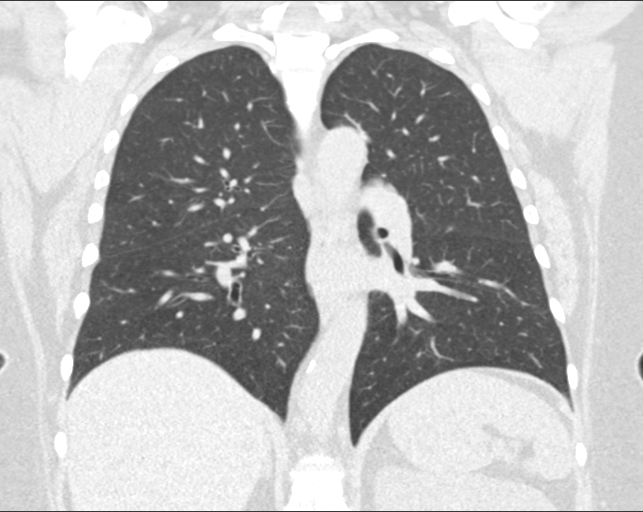

[15 of 36 positions shown; findings below may reference images not displayed]

FINDINGS: Cardiovascular: No acute findings. Mild aortic atherosclerotic
calcification noted.

Mediastinum/Nodes: No masses or pathologically enlarged lymph nodes
identified on this unenhanced exam.

Lungs/Pleura: No suspicious nodules or masses identified. No
evidence of infiltrate or pleural effusion.

Upper Abdomen:  Unremarkable.

Musculoskeletal:  No suspicious bone lesions.
IMPRESSION: No evidence of metastatic disease or other significant abnormality
within the thorax.

Aortic Atherosclerosis ([OW]-[OW]).

## 2020-12-18 IMAGING — MR MR PELVIS WO/W CM
15 of 16 series · 42 of 48 positions shown · IV contrast (gadavist)
Comparison: CT on [DATE]

CLINICAL DATA: Symptomatic uterine fibroids.

EXAM:
MRI PELVIS WITHOUT AND WITH CONTRAST
TECHNIQUE: Multiplanar multisequence MR imaging of the pelvis was performed
both before and after administration of intravenous contrast.
CONTRAST:  7.5mL GADAVIST GADOBUTROL 1 MMOL/ML IV SOLN

[Series 2: T2 · coronal · 6.0mm · 1.19mm/px · 1 of 30 slices shown (1 of 4)]
[im 1/30]
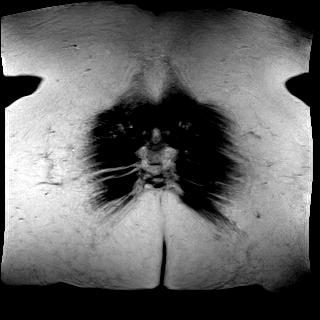

[Series 3: T2 · axial · 6.0mm · 1.19mm/px · 1 of 38 slices shown (2 of 4)]
[im 1/38]
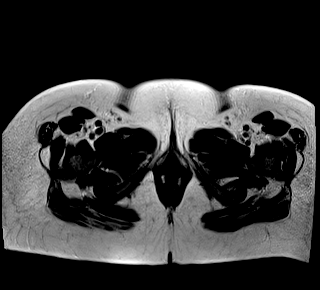

[Series 4: T2 · coronal · 5.0mm · 0.83mm/px · 1 of 31 slices shown (3 of 4)]
[im 1/31]
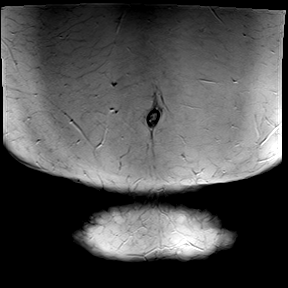

[Series 5: T2 fat-sat · axial · 5.0mm · 0.94mm/px · z∈[-165,+87]mm · 2 of 43 slices shown]
[im 1/43]
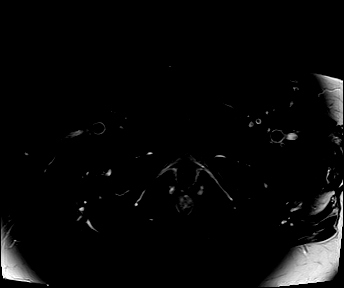
[im 43/43]
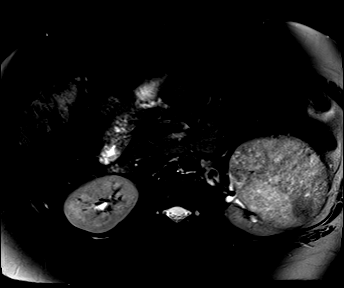

[Series 6: T1 fat-sat · axial · 3.0mm · 1.19mm/px · z∈[-169,+92]mm · 4 of 88 slices shown (1 of 3)]
[im 1/88]
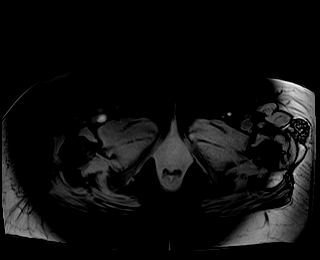
[im 30/88]
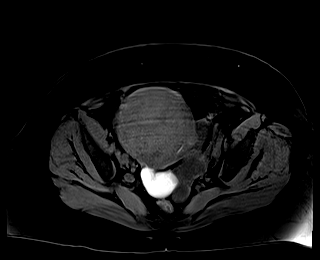
[im 59/88]
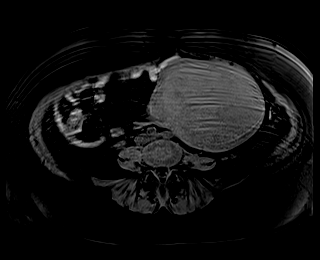
[im 88/88]
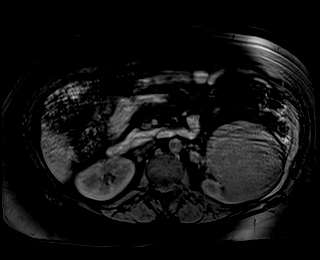

[Series 6: ax dwi_tracew · axial · 6.0mm · 1.14mm/px · z∈[-165,+87]mm · 5 of 108 slices shown]
[im 1/108]
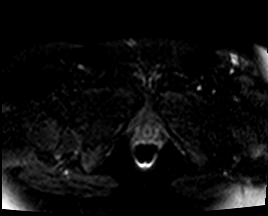
[im 27/108]
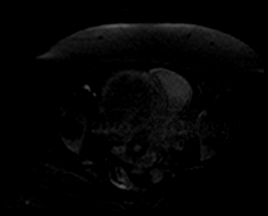
[im 54/108]
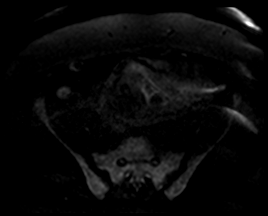
[im 81/108]
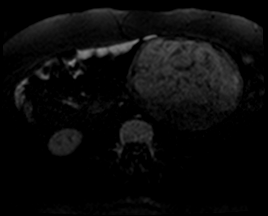
[im 108/108]
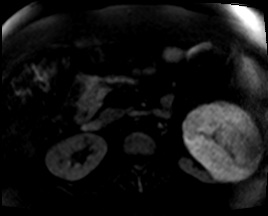

[Series 7: ax dwi_adc · axial · 6.0mm · 1.14mm/px · z∈[-165,+87]mm · 2 of 36 slices shown]
[im 1/36]
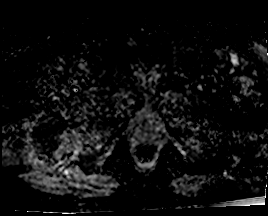
[im 36/36]
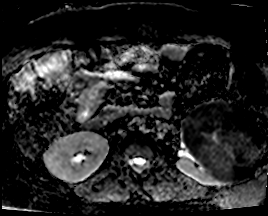

[Series 8: DIXON · axial · 3.0mm · 1.19mm/px · z∈[-169,+92]mm · 4 of 88 slices shown (1 of 2)]
[im 1/88]
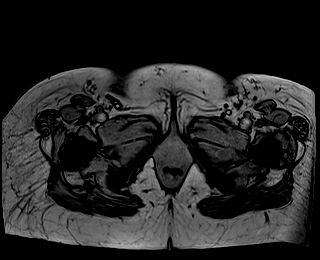
[im 30/88]
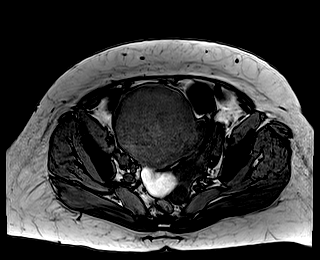
[im 59/88]
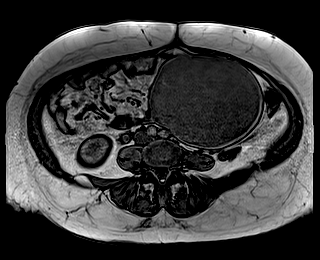
[im 88/88]
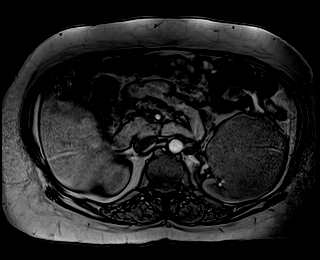

[Series 9: DIXON · axial · 3.0mm · 1.19mm/px · z∈[-169,+92]mm · 4 of 88 slices shown (2 of 2)]
[im 1/88]
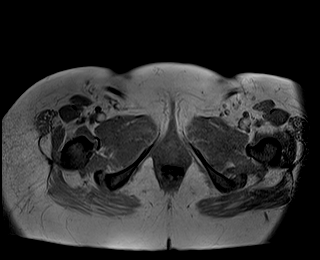
[im 30/88]
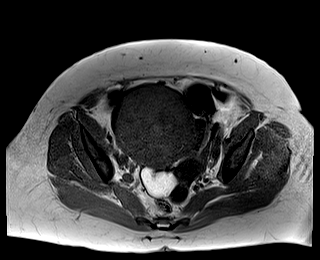
[im 59/88]
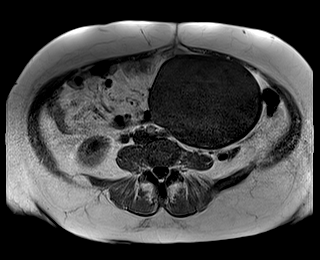
[im 88/88]
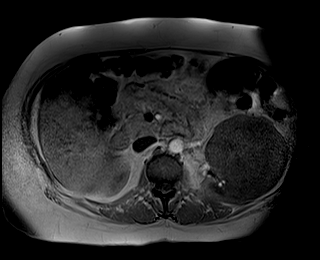

[Series 10: T2 · sagittal · 6.0mm · 1.19mm/px · 1 of 30 slices shown (4 of 4)]
[im 1/30]
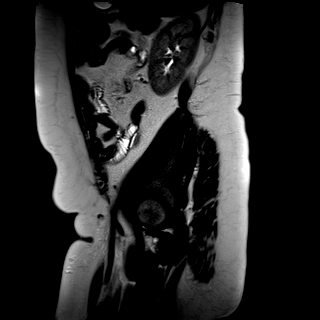

[Series 11: T1 · axial · 3.0mm · 1.19mm/px · z∈[-169,+92]mm · 4 of 88 slices shown]
[im 1/88]
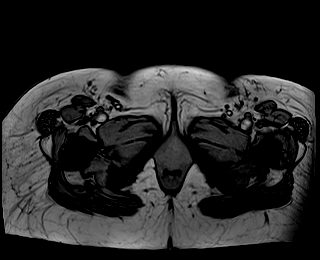
[im 30/88]
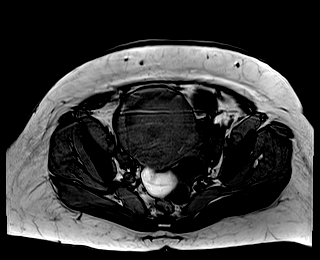
[im 59/88]
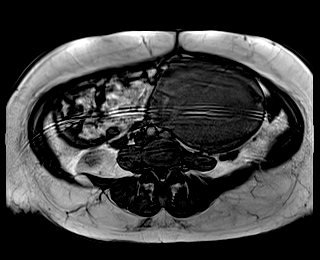
[im 88/88]
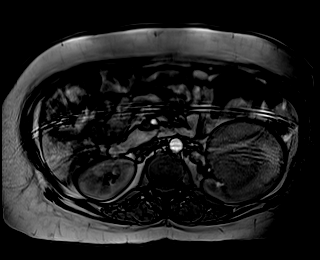

[Series 12: T1 fat-sat · axial · 3.0mm · 1.19mm/px · z∈[-315,-102]mm · 3 of 72 slices shown (2 of 3)]
[im 1/72]
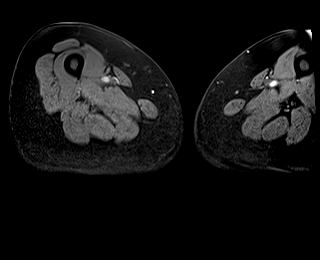
[im 36/72]
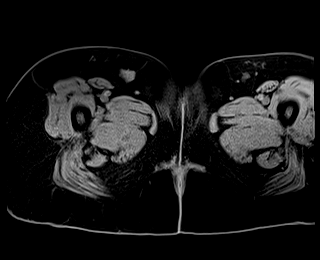
[im 72/72]
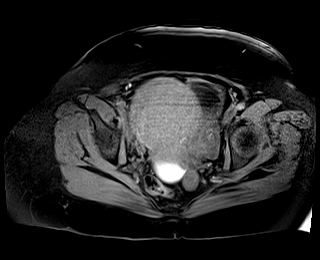

[Series 13: T1 fat-sat · axial · 3.0mm · 1.19mm/px · z∈[-169,+92]mm · 4 of 88 slices shown (3 of 3)]
[im 1/88]
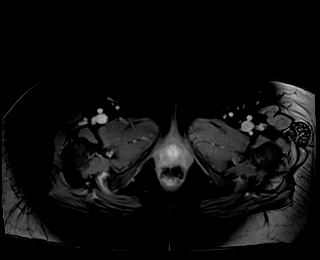
[im 30/88]
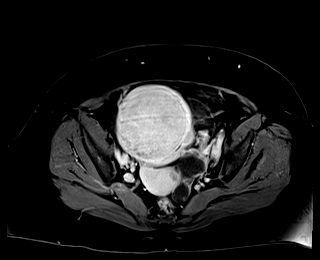
[im 59/88]
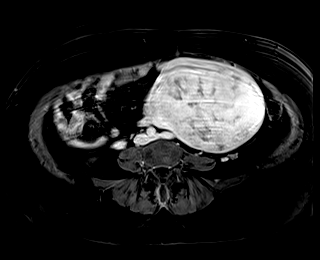
[im 88/88]
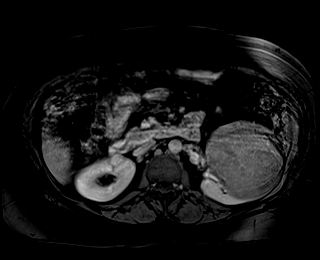

[Series 15: T1 dynamic · axial · 3.0mm · 0.49mm/px · z∈[-164,+97]mm · 4 of 88 slices shown]
[im 1/88]
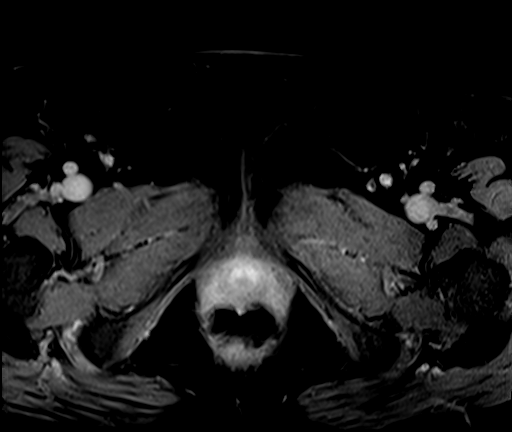
[im 30/88]
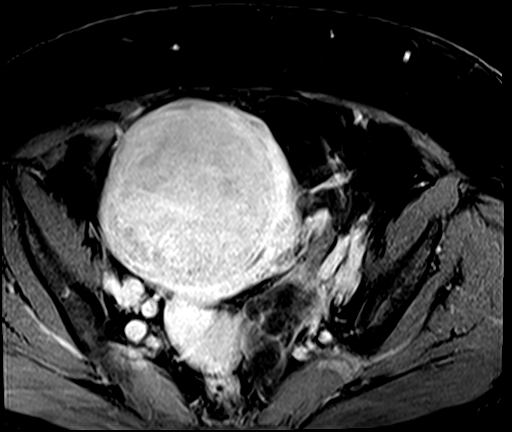
[im 59/88]
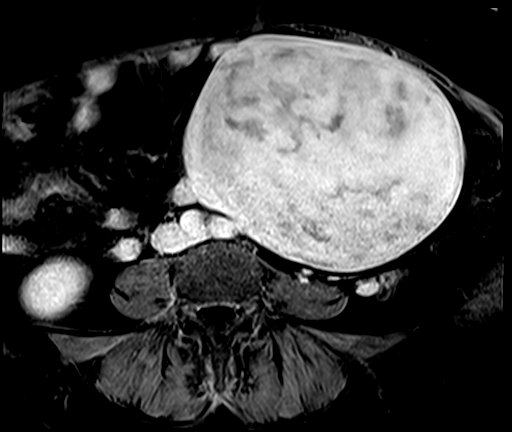
[im 88/88]
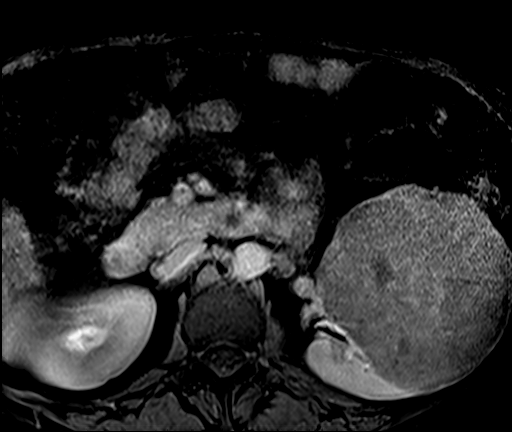

[Series 16: T1 dynamic fat-sat post-contrast · sagittal · 3.0mm · 0.47mm/px · 2 of 160 slices shown]
[im 1/160]
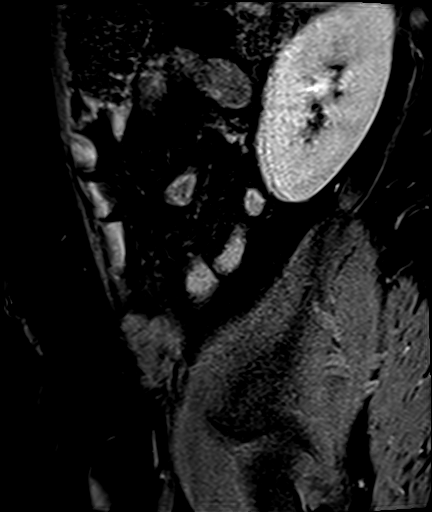
[im 27/160]
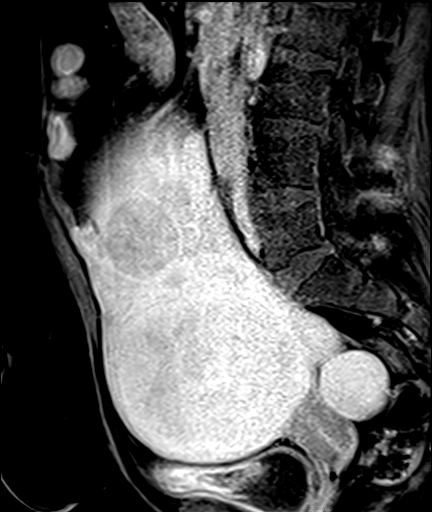

[42 of 48 positions shown; findings below may reference images not displayed]

FINDINGS: Lower Urinary Tract: No bladder or urethral abnormality identified.

Bowel:  Unremarkable visualized pelvic bowel loops.

Vascular/Lymphatic: No pathologically enlarged lymph nodes or other
significant abnormality.

Reproductive:

-- Uterus: Measures 24.1 by 11.1 by 9.6 cm (volume = [FR] cm^3).
Diffuse uterine involvement by numerous fibroids are seen. The
majority of these measure 1-2 cm in size. Two dominant fibroids are
seen which are subserosal in location in the right lateral corpus
measuring 9.1 cm in maximum diameter, and extending exophytically
from the left uterine fundus into the left abdomen measuring 12.9 cm
in maximum diameter. This fibroid is pedunculated, but has a thick
pedicle of attachment to the uterus measuring approximately 8.5 cm
in thickness. No abnormal endometrial thickening is seen. Cervix and
vagina are normal appearance.

-- Intracavitary fibroids:  None.

-- Pedunculated fibroids: 12.9 cm pedunculated fibroid extending
from the left uterine fundus into the left abdomen, which has a
thick soft tissue pedicle of attachment measuring approximately
cm in thickness.

-- Fibroid contrast enhancement: All fibroids show contrast
enhancement, without significant degeneration/devascularization.

-- Right ovary:  Appears normal.  No mass identified.

-- Left ovary: A complex cystic lesion is seen in the left adnexa
which has an elongated appearance and measures 10.8 x 5.5 cm.
Numerous internal septations are seen within this lesion which has a
somewhat tubular configuration, and this raises suspicion for
combination of a cystic ovarian lesion and adjacent hydrosalpinx.

Other: Tiny amount of free pelvic fluid noted.

Musculoskeletal:  Unremarkable.
IMPRESSION: Markedly enlarged uterus with numerous fibroids. Dominant 12.9 cm
pedunculated fibroid extends from the left uterine fundus into the
left abdomen, but has a thick soft tissue pedicle of attachment. No
intracavitary fibroids identified.

10.8 cm complex cystic lesion in the left adnexa, which is
indeterminate. Its elongated/tubular configuration suggests the
possibility of a cystic ovarian lesion combined with an adjacent
hydrosalpinx,; malignancy cannot be excluded. Recommend correlation
with tumor markers, and consider surgical evaluation versus
follow-up by MRI in 6 weeks.

## 2020-12-18 MED ORDER — GADOBUTROL 1 MMOL/ML IV SOLN
7.5000 mL | Freq: Once | INTRAVENOUS | Status: AC | PRN
  Administered 2020-12-18: 7.5 mL via INTRAVENOUS

## 2020-12-22 ENCOUNTER — Telehealth: Payer: Self-pay

## 2020-12-22 NOTE — Progress Notes (Signed)
12/23/20 12:11 PM   Mariah Lang 09-Apr-1970 [id number]  Referring provider:  Charlott Rakes, MD [address] [place],  [place] [address] Chief Complaint  Patient presents with   renal mass     HPI: Mariah Lang is a 50 y.o.female with a personal history of left renal mass, who presents today for follow-up.   CT abdomen and pelvis contrast on 11/15/2020 revealed a 10.7 cm left upper pole rena mass, compatible with solid renal neoplasm such as renal cell carcinoma. No findings were suspicious for metastatic disease. Additionally, uterine fibroids, including a dominant 14.5 cm subserosal fibroid in the left uterine fundus was identified.  12/18/2020 MRI of pelvis revealed markedly enlarged uterus with numerous fibroids. Dominant 12.9 cm pedunculated fibroid extends from the left uterine fundus into the left abdomen, but has a thick soft tissue pedicle of attachment. No intracavitary fibroids identified. 10.8 cm complex cystic lesion in the left adnexa, which is indeterminate. Its elongated/tubular configuration suggests the possibility of a cystic ovarian lesion combined with an adjacent hydrosalpinx,; malignancy cannot be excluded.   12/18/2020 MRI of abdomen revealed 10.3 cm solid enhancing mass arising from the anterior mid pole of the left kidney, high suspicious for renal cell carcinoma, no evidence of abdominal metastatic disease, and large uterine fibroid enhancing the left abdomen.   CT of chest showed no evidence of metastatic disease or other significant abnormality within th thorax.   She is accompanied today by a spanish interpreter.   She reports about a year ago she was experiencing clots in her urine, she was told that it was menstruation.    PMH: Past Medical History:  Diagnosis Date   Anemia     Surgical History: Past Surgical History:  Procedure Laterality Date   APPENDECTOMY     CESAREAN SECTION  2000   OTHER  SURGICAL HISTORY  1993   c-section    Home Medications:  Allergies as of 12/23/2020   No Known Allergies      Medication List        Accurate as of December 23, 2020 12:11 PM. If you have any questions, ask your nurse or doctor.          STOP taking these medications    sulfamethoxazole-trimethoprim 800-160 MG tablet Commonly known as: BACTRIM DS Stopped by: Hollice Espy, MD       TAKE these medications    Calcium 1200 1200-1000 MG-UNIT Chew Chew 1 tablet by mouth daily.   cholecalciferol 25 MCG (1000 UNIT) tablet Commonly known as: VITAMIN D3 Take 1 tablet (1,000 Units total) by mouth daily.   meloxicam 7.5 MG tablet Commonly known as: MOBIC Tome 1 tableta (7.5 mg en total) por va oral diariamente. (Take 1 tablet (7.5 mg total) by mouth daily.)   Vitamin D (Ergocalciferol) 1.25 MG (50000 UNIT) Caps capsule Commonly known as: DRISDOL Take 1 capsule (50,000 Units total) by mouth every 7 (seven) days.        Allergies: No Known Allergies  Family History: Family History  Problem Relation Age of Onset   Diabetes Mother    Hypertension Mother    Diabetes Paternal Grandmother     Social History:  reports that she has never smoked. She has never used smokeless tobacco. She reports that she does not drink alcohol and does not use drugs.   Physical Exam: BP 132/78   Pulse 82   Ht 5' (1.524 m)   Wt 185 lb (83.9 kg)  BMI 36.13 kg/m   Constitutional:  Alert and oriented, No acute distress. HEENT: [place] AT, moist mucus membranes.  Trachea midline, no masses. Cardiovascular: No clubbing, cyanosis, or edema. Respiratory: Normal respiratory effort, no increased work of breathing. Abdomen: Obese.  Right lower quadrant vertical incision from previous appendectomy along with C-section scar noted. Skin: No rashes, bruises or suspicious lesions. Neurologic: Grossly intact, no focal deficits, moving all 4 extremities. Psychiatric: Normal mood and  affect.  Laboratory Data:  Lab Results  Component Value Date   CREATININE 0.79 11/15/2020    Lab Results  Component Value Date   HGBA1C 5.7 (A) 09/14/2020    Pertinent Imaging: CLINICAL DATA:  Symptomatic uterine fibroids.   EXAM: MRI PELVIS WITHOUT AND WITH CONTRAST   TECHNIQUE: Multiplanar multisequence MR imaging of the pelvis was performed both before and after administration of intravenous contrast.   CONTRAST:  7.21mL GADAVIST GADOBUTROL 1 MMOL/ML IV SOLN   COMPARISON:  CT on 11/16/2020   FINDINGS: Lower Urinary Tract: No bladder or urethral abnormality identified.   Bowel:  Unremarkable visualized pelvic bowel loops.   Vascular/Lymphatic: No pathologically enlarged lymph nodes or other significant abnormality.   Reproductive:   -- Uterus: Measures 24.1 by 11.1 by 9.6 cm (volume = 1300 cm^3). Diffuse uterine involvement by numerous fibroids are seen. The majority of these measure 1-2 cm in size. Two dominant fibroids are seen which are subserosal in location in the right lateral corpus measuring 9.1 cm in maximum diameter, and extending exophytically from the left uterine fundus into the left abdomen measuring 12.9 cm in maximum diameter. This fibroid is pedunculated, but has a thick pedicle of attachment to the uterus measuring approximately 8.5 cm in thickness. No abnormal endometrial thickening is seen. Cervix and vagina are normal appearance.   -- Intracavitary fibroids:  None.   -- Pedunculated fibroids: 12.9 cm pedunculated fibroid extending from the left uterine fundus into the left abdomen, which has a thick soft tissue pedicle of attachment measuring approximately 8.5 cm in thickness.   -- Fibroid contrast enhancement: All fibroids show contrast enhancement, without significant degeneration/devascularization.   -- Right ovary:  Appears normal.  No mass identified.   -- Left ovary: A complex cystic lesion is seen in the left adnexa which has  an elongated appearance and measures 10.8 x 5.5 cm. Numerous internal septations are seen within this lesion which has a somewhat tubular configuration, and this raises suspicion for combination of a cystic ovarian lesion and adjacent hydrosalpinx.   Other: Tiny amount of free pelvic fluid noted.   Musculoskeletal:  Unremarkable.   IMPRESSION: Markedly enlarged uterus with numerous fibroids. Dominant 12.9 cm pedunculated fibroid extends from the left uterine fundus into the left abdomen, but has a thick soft tissue pedicle of attachment. No intracavitary fibroids identified.   10.8 cm complex cystic lesion in the left adnexa, which is indeterminate. Its elongated/tubular configuration suggests the possibility of a cystic ovarian lesion combined with an adjacent hydrosalpinx,; malignancy cannot be excluded. Recommend correlation with tumor markers, and consider surgical evaluation versus follow-up by MRI in 6 weeks.     Electronically Signed   By: Marlaine Hind M.D.   On: 12/19/2020 09:20  CLINICAL DATA:  Left renal mass.   EXAM: MRI ABDOMEN WITHOUT AND WITH CONTRAST   TECHNIQUE: Multiplanar multisequence MR imaging of the abdomen was performed both before and after the administration of intravenous contrast.   CONTRAST:  7.61mL GADAVIST GADOBUTROL 1 MMOL/ML IV SOLN   COMPARISON:  CT  on 11/16/2020   FINDINGS: Lower chest: No acute findings.   Hepatobiliary: No hepatic masses identified. 2 cm gallstone is seen, however there is no evidence of cholecystitis or biliary ductal dilatation.   Pancreas: No mass or inflammatory changes. No evidence of pancreatic ductal dilatation.   Spleen:  Within normal limits in size and appearance.   Adrenals/Urinary Tract: The adrenal glands and right kidney are normal appearance. A large solid enhancing soft tissue mass is seen arising from the anterior midpole the left kidney which measures 10.3 x 9.6 cm. This shows no evidence of  internal fat, and is highly suspicious for renal cell carcinoma. Retroaortic left renal vein is noted, however there is no evidence of tumor thrombus. This mass abuts, but does not extend through Gerota fascia.   Stomach/Bowel: Visualized portion unremarkable.   Vascular/Lymphatic: No pathologically enlarged lymph nodes identified. No acute vascular findings.   Other: A large uterine fibroid is seen extending into the left abdomen.   Musculoskeletal:  No suspicious bone lesions identified.   IMPRESSION: 10.3 cm solid enhancing mass arising from the anterior midpole of the left kidney, highly suspicious for renal cell carcinoma.   No evidence of abdominal metastatic disease.   Cholelithiasis. No radiographic evidence of cholecystitis or biliary ductal dilatation.   Large uterine fibroid extending into the left abdomen. See separate report for pelvis MRI without and with contrast.     Electronically Signed   By: Marlaine Hind M.D.   On: 12/19/2020 09:31  CLINICAL DATA:  Newly diagnosed left renal neoplasm. Staging.   EXAM: CT CHEST WITHOUT CONTRAST   TECHNIQUE: Multidetector CT imaging of the chest was performed following the standard protocol without IV contrast.   COMPARISON:  None.   FINDINGS: Cardiovascular: No acute findings. Mild aortic atherosclerotic calcification noted.   Mediastinum/Nodes: No masses or pathologically enlarged lymph nodes identified on this unenhanced exam.   Lungs/Pleura: No suspicious nodules or masses identified. No evidence of infiltrate or pleural effusion.   Upper Abdomen:  Unremarkable.   Musculoskeletal:  No suspicious bone lesions.   IMPRESSION: No evidence of metastatic disease or other significant abnormality within the thorax.   Aortic Atherosclerosis (ICD10-I70.0).     Electronically Signed   By: Marlaine Hind M.D.   On: 12/19/2020 10:18   I have personally reviewed the images and agree with radiologist  interpretation.     Assessment & Plan:    Left renal mass  - reviewed MRI which is highly concerning for renal cell carcinoma she would benefit from a radical nephrectomy ; no other evidence of metastatic disease on chest CT over the abdomen pelvis  - Discussed case with Dr Theora Gianotti  -Due to pedunculated into the left abdomen she is interesting in trying minimally invasive course. Nephrectomy is to follow hysterectomy versus fibroid removal   - Risk of nephrectomy consist of bleeding, infection, hernia, bowel injury, and scar tissue formation, ileus amongst others.  We also discussed the risk of progression to end-stage renal disease as well as solitary kidney precautions.  -We will attempt to do this robotically if Dr. Theora Gianotti uses a robotic approach, otherwise could also consider hand assist laparoscopic.  There is a low threshold to convert to open via large midline incision if necessary.  Patient understands all this.  We will likely consent her for all of these in anticipation.  -We discussed the postoperative course    I,Kailey Littlejohn,acting as a scribe for Hollice Espy, MD.,have documented all relevant documentation on the  behalf of Hollice Espy, MD,as directed by  Hollice Espy, MD while in the presence of Hollice Espy, MD.  Hollice Espy, MD   [facility] [address] [place], [place] [address] [phone]

## 2020-12-22 NOTE — Telephone Encounter (Addendum)
MRI pelvis/abdomen sent to Dr. Theora Gianotti for review. Sees Dr. Erlene Quan 12/23/20.

## 2020-12-23 ENCOUNTER — Encounter: Payer: Self-pay | Admitting: Urology

## 2020-12-23 ENCOUNTER — Other Ambulatory Visit: Payer: Self-pay

## 2020-12-23 ENCOUNTER — Encounter: Payer: Self-pay | Admitting: *Deleted

## 2020-12-23 ENCOUNTER — Ambulatory Visit (INDEPENDENT_AMBULATORY_CARE_PROVIDER_SITE_OTHER): Payer: Self-pay | Admitting: Urology

## 2020-12-23 ENCOUNTER — Other Ambulatory Visit: Payer: Self-pay | Admitting: Physician Assistant

## 2020-12-23 VITALS — BP 132/78 | HR 82 | Ht 60.0 in | Wt 185.0 lb

## 2020-12-23 DIAGNOSIS — E559 Vitamin D deficiency, unspecified: Secondary | ICD-10-CM

## 2020-12-23 DIAGNOSIS — N2889 Other specified disorders of kidney and ureter: Secondary | ICD-10-CM

## 2020-12-25 ENCOUNTER — Other Ambulatory Visit: Payer: Self-pay | Admitting: Urology

## 2020-12-25 DIAGNOSIS — N2889 Other specified disorders of kidney and ureter: Secondary | ICD-10-CM

## 2020-12-25 NOTE — Progress Notes (Signed)
Surgical Physician Order Form  ** Scheduling expectation : Next Available  *Length of Case:   *Clearance needed: no  *Anticoagulation Instructions: Hold all anticoagulants  *Aspirin Instructions: Hold Aspirin  *Post-op visit Date/Instructions:  1 month follow up  *Diagnosis:  Left renal mass  *Procedure:  Left robotic radical nephrectomy versus left hand assist laparoscopic radical nephrectomy versus open left radical nephrectomy  -Admit type: INpatient  -Anesthesia: General  -VTE Prophylaxis Standing Order SCD's       Other:   -Standing Lab Orders Per Anesthesia    Lab other:  UA/urine culture, CBC, BMP, INR, type and screen  -Standing Test orders EKG/Chest x-ray per Anesthesia       Test other:   - Medications:     Ancef 2gm IV   Other Instructions: Will need to coordinate with Dr. Theora Gianotti.  Likely a Wednesday in the latter half of the day.  Would like Dr. Diamantina Providence to assist if possible.

## 2020-12-28 ENCOUNTER — Encounter (INDEPENDENT_AMBULATORY_CARE_PROVIDER_SITE_OTHER): Payer: Self-pay

## 2020-12-28 ENCOUNTER — Other Ambulatory Visit: Payer: Self-pay

## 2020-12-28 ENCOUNTER — Encounter: Payer: Self-pay | Admitting: Family Medicine

## 2020-12-28 ENCOUNTER — Ambulatory Visit: Payer: Self-pay | Attending: Family Medicine | Admitting: Family Medicine

## 2020-12-28 VITALS — BP 110/76 | HR 74 | Ht 60.0 in | Wt 185.8 lb

## 2020-12-28 DIAGNOSIS — N2889 Other specified disorders of kidney and ureter: Secondary | ICD-10-CM

## 2020-12-28 DIAGNOSIS — M722 Plantar fascial fibromatosis: Secondary | ICD-10-CM

## 2020-12-28 DIAGNOSIS — Z1211 Encounter for screening for malignant neoplasm of colon: Secondary | ICD-10-CM

## 2020-12-28 DIAGNOSIS — E559 Vitamin D deficiency, unspecified: Secondary | ICD-10-CM

## 2020-12-28 NOTE — Progress Notes (Signed)
Subjective:  Patient ID: Mariah Lang, female    DOB: December 23, 1970  Age: 50 y.o. MRN: [id number]  CC: Foot Pain   HPI Mariah Lang Jarome Lamas is a 50 y.o. year old female with a history of plantar fasciitis, left renal mass, uterine fibroids who presents today for follow-up visit.  Interval History: She states she is doing good and has no concerns. Her feet have gotten better. She goes on to say after prolonged sitting she has difficulty walking but this improves as she continues walking.  Her vitamin D was low in 09/2020 and she has been on replacement. With regards to exercise she gets a lot at work as she works at [employer]. Seen by [facility] and notes reviewed from last week with plans for radical nephrectomy and hysterectomy. Past Medical History:  Diagnosis Date   Anemia     Past Surgical History:  Procedure Laterality Date   APPENDECTOMY     CESAREAN SECTION  2000   OTHER SURGICAL HISTORY  1993   c-section    Family History  Problem Relation Age of Onset   Diabetes Mother    Hypertension Mother    Diabetes Paternal Grandmother     No Known Allergies  Outpatient Medications Prior to Visit  Medication Sig Dispense Refill   Calcium Carbonate-Vit D-Min (CALCIUM 1200) 1200-1000 MG-UNIT CHEW Chew 1 tablet by mouth daily. 30 tablet    cholecalciferol (VITAMIN D3) 25 MCG (1000 UT) tablet Take 1 tablet (1,000 Units total) by mouth daily.     meloxicam (MOBIC) 7.5 MG tablet Take 1 tablet (7.5 mg total) by mouth daily. 30 tablet 1   Vitamin D, Ergocalciferol, (DRISDOL) 1.25 MG (50000 UNIT) CAPS capsule Take 1 capsule (50,000 Units total) by mouth every 7 (seven) days. 4 capsule 2   No facility-administered medications prior to visit.     ROS Review of Systems  Constitutional:  Negative for activity change, appetite change and fatigue.  HENT:  Negative for congestion, sinus pressure and sore throat.   Eyes:  Negative for visual  disturbance.  Respiratory:  Negative for cough, chest tightness, shortness of breath and wheezing.   Cardiovascular:  Negative for chest pain and palpitations.  Gastrointestinal:  Negative for abdominal distention, abdominal pain and constipation.  Endocrine: Negative for polydipsia.  Genitourinary:  Negative for dysuria and frequency.  Musculoskeletal:  Negative for arthralgias and back pain.  Skin:  Negative for rash.  Neurological:  Negative for tremors, light-headedness and numbness.  Hematological:  Does not bruise/bleed easily.  Psychiatric/Behavioral:  Negative for agitation and behavioral problems.    Objective:  BP 110/76   Pulse 74   Ht 5' (1.524 m)   Wt 185 lb 12.8 oz (84.3 kg)   SpO2 98%   BMI 36.29 kg/m   BP/Weight 12/28/2020 12/23/2020 5/00/9381  Systolic BP 829 937 169  Diastolic BP 76 78 85  Wt. (Lbs) 185.8 185 185.3  BMI 36.29 36.13 36.19      Physical Exam Constitutional:      Appearance: She is well-developed.  Cardiovascular:     Rate and Rhythm: Normal rate.     Heart sounds: Normal heart sounds. No murmur heard. Pulmonary:     Effort: Pulmonary effort is normal.     Breath sounds: Normal breath sounds. No wheezing or rales.  Chest:     Chest wall: No tenderness.  Abdominal:     General: Bowel sounds are normal. There is no distension.  Palpations: Abdomen is soft. There is no mass.     Tenderness: There is no abdominal tenderness.  Musculoskeletal:        General: Normal range of motion.     Right lower leg: No edema.     Left lower leg: No edema.  Neurological:     Mental Status: She is alert and oriented to person, place, and time.  Psychiatric:        Mood and Affect: Mood normal.    CMP Latest Ref Rng & Units 11/15/2020 09/14/2020 05/22/2019  Glucose 70 - 99 mg/dL 89 90 80  BUN 6 - 20 mg/dL 13 9 10   Creatinine 0.44 - 1.00 mg/dL 0.79 0.54(L) 0.63  Sodium 135 - 145 mmol/L 139 138 139  Potassium 3.5 - 5.1 mmol/L 3.9 4.8 4.0  Chloride  98 - 111 mmol/L 109 103 109  CO2 22 - 32 mmol/L 25 - 23  Calcium 8.9 - 10.3 mg/dL 8.6(L) 9.9 9.0  Total Protein 6.5 - 8.1 g/dL 6.8 7.8 7.8  Total Bilirubin 0.3 - 1.2 mg/dL 0.5 0.3 0.3  Alkaline Phos 38 - 126 U/L 52 73 57  AST 15 - 41 U/L 26 18 16   ALT 0 - 44 U/L 22 - 13    Lipid Panel     Component Value Date/Time   CHOL 189 09/14/2020 1011   TRIG 190 (H) 09/14/2020 1011   HDL 46 09/14/2020 1011   CHOLHDL 4.1 09/14/2020 1011   CHOLHDL 3.3 09/02/2014 1111   VLDL 14 09/02/2014 1111   LDLCALC 110 (H) 09/14/2020 1011    CBC    Component Value Date/Time   WBC 9.5 12/04/2020 1413   RBC 5.37 (H) 12/04/2020 1413   HGB 14.0 12/04/2020 1413   HGB 14.6 09/14/2020 1011   HCT 42.5 12/04/2020 1413   HCT 48.4 (H) 09/14/2020 1011   PLT 467 (H) 12/04/2020 1413   PLT 449 09/14/2020 1011   MCV 79.1 (L) 12/04/2020 1413   MCV 83 09/14/2020 1011   MCH 26.1 12/04/2020 1413   MCHC 32.9 12/04/2020 1413   RDW 15.1 12/04/2020 1413   RDW 17.5 (H) 09/14/2020 1011   LYMPHSABS 2.8 12/04/2020 1413   LYMPHSABS 2.0 09/14/2020 1011   MONOABS 0.8 12/04/2020 1413   EOSABS 0.1 12/04/2020 1413   EOSABS 0.1 09/14/2020 1011   BASOSABS 0.0 12/04/2020 1413   BASOSABS 0.1 09/14/2020 1011    Lab Results  Component Value Date   HGBA1C 5.7 (A) 09/14/2020    Assessment & Plan:  1. Plantar fasciitis Improved Currently on meloxicam Continue with insoles Will provide education around plantar fasciitis exercises  2. Left renal mass Currently being worked up for elective nephrectomy  3. Vitamin D deficiency Currently on Drisdol Will check repeat vitamin D levels as it has been 3 weeks since replacement - VITAMIN D 25 Hydroxy (Vit-D Deficiency, Fractures)  4. Screening for colon cancer - Fecal occult blood, imunochemical([facility])   No orders of the defined types were placed in this encounter.   Follow-up: Return in about 6 months (around 06/27/2021) for medical conditions.        Charlott Rakes, MD, FAAFP. [facility] [place], [place]   12/28/2020, 3:48 PM

## 2020-12-28 NOTE — Patient Instructions (Signed)
Fascitis plantar Plantar Fasciitis La fascitis plantar es una afeccin dolorosa que se produce en el taln. Ocurre cuando la banda de tejido que [employer] dedos con el hueso del taln (fascia plantar) se irrita. Esto puede ocurrir por Financial planner ejercicio u otras actividades repetitivas (lesin por uso excesivo). La fascitis plantar puede causar desde una leve irritacin hasta dolor intenso que dificulta que la persona camine o se [place]. Por lo general, el dolor es peor a la maana despus de dormir, o despus de Public affairs consultant sentado o acostado durante un perodo de [place]. El dolor tambin puede empeorar despus de caminar o estar de pie por [employer]. Cules son las causas? Esta afeccin puede ser causada por lo siguiente: Estar de pie durante largos perodos. Usar zapatos que no tengan un buen soporte para el arco. Realizar actividades que implican esfuerzo para las articulaciones (actividades de alto impacto). Esto incluye el ballet y la actividad fsica que hace que el corazn lata ms rpido (ejercicio aerbico), Solicitor. Tener sobrepeso. Tener una forma de caminar (andar) anormal. Presentar rigidez muscular en la parte posterior de la parte inferior de la pierna (pantorrilla). Arcos [employer] o pies planos. Comenzar una nueva actividad fsica. Cules son los signos o sntomas? El sntoma principal de esta afeccin es el dolor en el taln. El dolor puede empeorar despus de lo siguiente: Con los primeros pasos luego de estar en reposo, especialmente por la maana despus de dormir o de haber estado sentado o acostado durante un [place]. Largos perodos de Public affairs consultant de pie. El dolor puede disminuir despus de 30 a 45 minutos de [place], como caminar apaciblemente. Cmo se diagnostica? Esta afeccin se puede diagnosticar en funcin de los antecedentes mdicos, un examen fsico y los sntomas. El mdico controlar lo siguiente: Un rea dolorida en la parte inferior del  pie. [place] alto en el pie o pies planos. Dolor al Agricultural consultant. Dificultad para mover el pie. Pueden realizarle estudios de diagnstico por imagen para confirmar el diagnstico, por ejemplo: Radiografas. Ecografa. Resonancia magntica (RM). Cmo se trata? El tratamiento de la fascitis plantar depende de la gravedad de su afeccin. El tratamiento puede incluir: Reposo, hielo, presin (compresin) y Lexicographer (elevar) el pie afectado. Esto se denomina tratamiento de RHCE (reposo, hielo, compresin, elevacin). El mdico puede recomendarle terapia de RHCE junto con medicamentos de venta libre para Best boy. Ejercicios para [place] pantorrillas y la fascia plantar. Una frula que Land[employer] Financial estirado y [place] mientras usted duerme (frula nocturna). Fisioterapia para [employer] sntomas y Customer service manager en el futuro. Inyecciones de medicamentos con corticoesteroides (cortisona) para Best boy y la inflamacin. Estimular su fascia plantar lesionada con impulsos elctricos (tratamiento con ondas de choque extracorpreas). Esto generalmente es la ltima opcin de tratamiento antes de la [place]. [place], si los otros tratamientos no han funcionado despus de 12 meses. Siga estas instrucciones en su casa: Control del dolor, la rigidez y la hinchazn  Si se lo indican, aplique hielo sobre la zona dolorida. Para hacer esto: Ponga el hielo en una bolsa de plstico o use una botella de [place]. Coloque una toalla entre la piel y la bolsa de hielo o la botella. Frote la parte inferior del pie sobre la bolsa o la botella. Haga esto durante 20 minutos, de 2 a 3 veces al Sun[employer]. Use calzado deportivo con amortiguacin de aire o gel, o pruebe usar plantillas blandas diseadas para la fascitis plantar. Cuando est sentado  o acostado, eleve el pie por encima del nivel del corazn. Actividad Evite las actividades que le causan dolor. Pregntele al mdico qu actividades  son seguras para usted. Haga los ejercicios de fisioterapia y estiramiento como se lo haya indicado el mdico. Intente hacer actividades y tipos de ejercicio que sean ms suaves para las articulaciones (de bajo impacto). Por ejemplo, nadar, hacer ejercicios [employer], y Catering manager. Instrucciones generales Use los medicamentos de venta libre y los recetados solamente como se lo haya indicado el mdico. Si el mdico se lo indica, use una frula nocturna para dormir. Afloje la frula si los dedos de los pies se le entumecen, siente hormigueos o se le enfran y se tornan de Optician, dispensing. Mantenga un peso saludable, o colabore con su mdico para perder [employer]. Cumpla con todas las visitas de seguimiento. Esto es importante. Comunquese con un mdico si tiene: Sntomas que no desaparecen con Teacher, early years/pre. Dolor que Mary Esther. Dolor que afecta su capacidad de moverse o de Optometrist sus actividades diarias. Resumen La fascitis plantar es una afeccin dolorosa que se produce en el taln. Ocurre cuando la banda de tejido que [employer] dedos con el hueso del taln (fascia plantar) se irrita. El dolor en el taln es el sntoma principal de esta afeccin. Puede empeorar despus de Buyer, retail ejercicio o de Public affairs consultant quieto de pie durante mucho tiempo. El tratamiento vara, pero normalmente comienza con el reposo, la aplicacin de hielo, la aplicacin de presin (compresin) y la elevacin del pie afectado. Esto se denomina tratamiento de RHCE (reposo, hielo, compresin, elevacin). Tambin se pueden usar analgsicos de venta libre para Financial controller. Esta informacin no tiene Marine scientist el consejo del mdico. Asegrese de hacerle al mdico cualquier pregunta que tenga. Document Revised: 09/23/2019 Document Reviewed: 09/23/2019 Elsevier Patient Education  [place].

## 2020-12-29 LAB — VITAMIN D 25 HYDROXY (VIT D DEFICIENCY, FRACTURES): Vit D, 25-Hydroxy: 31.7 ng/mL (ref 30.0–100.0)

## 2020-12-30 ENCOUNTER — Other Ambulatory Visit: Payer: Self-pay

## 2021-01-01 ENCOUNTER — Telehealth: Payer: Self-pay

## 2021-01-01 LAB — FECAL OCCULT BLOOD, IMMUNOCHEMICAL: Fecal Occult Bld: NEGATIVE

## 2021-01-01 NOTE — Telephone Encounter (Signed)
-----   Message from Charlott Rakes, MD sent at 01/01/2021  9:26 AM EDT ----- Please inform her that her stool test to screen for colon cancer is negative.

## 2021-01-01 NOTE — Telephone Encounter (Signed)
Pt has been sent a mychart message with lab results.

## 2021-01-01 NOTE — Telephone Encounter (Signed)
-----   Message from Charlott Rakes, MD sent at 12/30/2020  1:26 PM EDT ----- Please inform the patient that labs are normal. Thank you.

## 2021-01-06 ENCOUNTER — Inpatient Hospital Stay: Payer: Self-pay | Attending: Obstetrics and Gynecology | Admitting: Obstetrics and Gynecology

## 2021-01-06 VITALS — BP 148/89 | HR 64 | Temp 97.8°F | Resp 20 | Wt 185.0 lb

## 2021-01-06 DIAGNOSIS — D259 Leiomyoma of uterus, unspecified: Secondary | ICD-10-CM | POA: Insufficient documentation

## 2021-01-06 DIAGNOSIS — R140 Abdominal distension (gaseous): Secondary | ICD-10-CM | POA: Insufficient documentation

## 2021-01-06 DIAGNOSIS — D4102 Neoplasm of uncertain behavior of left kidney: Secondary | ICD-10-CM | POA: Insufficient documentation

## 2021-01-06 DIAGNOSIS — Z7189 Other specified counseling: Secondary | ICD-10-CM

## 2021-01-06 DIAGNOSIS — R102 Pelvic and perineal pain: Secondary | ICD-10-CM | POA: Insufficient documentation

## 2021-01-06 DIAGNOSIS — N950 Postmenopausal bleeding: Secondary | ICD-10-CM | POA: Insufficient documentation

## 2021-01-06 NOTE — Progress Notes (Addendum)
Gynecologic Oncology Consult Visit   Referring Provider: Dr. Erlene Quan  Chief Complaint: renal mass and uterine fibroids  Subjective:  Mariah Lang is a 50 y.o. G2P2 female who is seen in consultation from Dr. Darshana Rana for uterine fibroids in setting of renal mass.  12/02/2020  Pap NILM/HRHPV negative  12/04/2020 FSH 60.5  Lab Results  Component Value Date   WBC 9.5 12/04/2020   HGB 14.0 12/04/2020   HCT 42.5 12/04/2020   MCV 79.1 (L) 12/04/2020   PLT 467 (H) 12/04/2020     12/19/20- MR Pelvis W Wo Contrast Markedly enlarged uterus with numerous fibroids. Dominant 12.9 cm pedunculated fibroid extends from the left uterine fundus into the left abdomen, but has a thick soft tissue pedicle of attachment. No intracavitary fibroids identified.   10.8 cm complex cystic lesion in the left adnexa, which is indeterminate. Its elongated/tubular configuration suggests the possibility of a cystic ovarian lesion combined with an adjacent hydrosalpinx,; malignancy cannot be excluded. Recommend correlation with tumor markers, and consider surgical evaluation versus follow-up by MRI in 6 weeks.  12/19/20- MR Abdomen W WO Contrast 10.3 cm solid enhancing mass arising from the anterior midpole of the left kidney, highly suspicious for renal cell carcinoma.  No evidence of abdominal metastatic disease. Cholelithiasis. No radiographic evidence of cholecystitis or biliary ductal dilatation. Large uterine fibroid extending into the left abdomen. See separate report for pelvis MRI without and with contrast.  She has followed up with Dr. Erlene Quan and plan to proceed with surgery.    She presents to clinic today with an interpreter.   Gynecologic History:  Mariah Lang is a pleasant G4P2 female who is seen in consultation from Dr. Ericah Rana for uterine fibroids in setting of renal mass.  She states she has at least a 6-7 year history of uterine fibroids. Her fibroids are  palpable above her umbilicus. In the past they were 2-4 cm and then over the last two years have grown rapidly. She does have pelvic "cramping" pain like a menstrual cycle. She has bleeding monthly but it is much less than when she had regular cycles. She does not know if she has hot flashes, but feels like she has them now due to an antibiotic she is taking. She does not recall when she had prior imaging before the CT scan.   Patient presented to ED on 11/15/20 for LLQ abdominal pain. CT revealed 10.7 cm left upper pole renal mass compatible with solid renal neoplasm such as RCC. Additionally, uterine fibroids including a dominant 14.5 cm subserosal fibroid in the left uterine fundus was identified. She has abnormal uterine bleeding which has been managed by Dr. Ilda Basset, previously receiving injections but no longer taking d/t cost. Patient saw Dr. Erlene Quan on 11/25/20 for evaluation of renal mass. Nephrectomy was recommended. Patient referred to gyn-onc to evaluate for possible combined GU and GYN surgical case and determine if minimally invasive approach is possibility.      Problem List: Patient Active Problem List   Diagnosis Date Noted   Ankle edema, bilateral 09/14/2020   Vitamin D deficiency 09/14/2020   Thrombocytosis 05/11/2019   Language barrier 12/31/2018   Screening breast examination 12/04/2018   Fibroid uterus 12/18/2014   Menorrhagia, premenopausal 11/24/2014   Iron deficiency anemia 11/24/2014   Iron deficiency anemia due to chronic blood loss 09/02/2014   Pap smear for cervical cancer screening 09/02/2014   Annual physical exam 09/02/2014   Dysmenorrhea 02/15/2013   Anemia due to Dysmenorrhea  02/15/2013   Past Medical History: Past Medical History:  Diagnosis Date   Anemia    Past Surgical History: Past Surgical History:  Procedure Laterality Date   APPENDECTOMY     CESAREAN SECTION  2000   OTHER SURGICAL HISTORY  1993   c-section   Past Gynecologic History:   Menarche: 15 Menstrual details: irregular Menses regular: no Last Menstrual Period: 2020 History of OCP/HRT use:  History of Abnormal pap:  Last pap:  2016 History of STDs:  Contraception: no Sexually active:   OB History  Gravida Para Term Preterm AB Living  2 2 2  0 0 2  SAB IAB Ectopic Multiple Live Births  0 0 0   2    # Outcome Date GA Lbr Len/2nd Weight Sex Delivery Anes PTL Lv  2 Term      CS-Unspec     1 Term      CS-Unspec      Family History: Family History  Problem Relation Age of Onset   Diabetes Mother    Hypertension Mother    Diabetes Paternal Grandmother    Social History: Social History   Socioeconomic History   Marital status: Single    Spouse name: Not on file   Number of children: 2   Years of education: Not on file   Highest education level: 9th grade  Occupational History   Not on file  Tobacco Use   Smoking status: Never   Smokeless tobacco: Never  Vaping Use   Vaping Use: Never used  Substance and Sexual Activity   Alcohol use: No   Drug use: No   Sexual activity: Yes    Birth control/protection: None  Other Topics Concern   Not on file  Social History Narrative   ** Merged History Encounter **       Social Determinants of Health   Financial Resource Strain: Not on file  Food Insecurity: Not on file  Transportation Needs: Not on file  Physical Activity: Not on file  Stress: Not on file  Social Connections: Not on file  Intimate Partner Violence: Not on file   Allergies: No Known Allergies  Current Medications: Current Outpatient Medications  Medication Sig Dispense Refill   Calcium Carbonate-Vit D-Min (CALCIUM 1200) 1200-1000 MG-UNIT CHEW Chew 1 tablet by mouth daily. 30 tablet    cholecalciferol (VITAMIN D3) 25 MCG (1000 UT) tablet Take 1 tablet (1,000 Units total) by mouth daily.     meloxicam (MOBIC) 7.5 MG tablet Take 1 tablet (7.5 mg total) by mouth daily. 30 tablet 1   Vitamin D, Ergocalciferol, (DRISDOL) 1.25 MG  (50000 UNIT) CAPS capsule Take 1 capsule (50,000 Units total) by mouth every 7 (seven) days. 4 capsule 2   No current facility-administered medications for this visit.   Review of Systems General:  no complaints Skin: no complaints Eyes: no complaints HEENT: no complaints Breasts: no complaints Pulmonary: no complaints Cardiac: no complaints Gastrointestinal: bloating Genitourinary/Sexual: pelvic pain Ob/Gyn: no complaints Musculoskeletal: no complaints Hematology: no complaints Neurologic/Psych: no complaints   Objective:  Physical Examination:  BP (!) 148/89   Pulse 64   Temp 97.8 F (36.6 C)   Resp 20   Wt 185 lb (83.9 kg)   SpO2 100%   BMI 36.13 kg/m     ECOG Performance Status: 1 - Symptomatic but completely ambulatory  GENERAL: Patient is a well appearing female in no acute distress HEENT:  PERRL, neck supple.  NODES:  No inguinal lymphadenopathy palpated.  LUNGS:  No increased respiratory effort. ABDOMEN:  Soft, nontender, nondistended with firm 20 cm supraumbilical mass extending off the uterus on the left and a uterine mass on the righth ~14-16 cm. No ascites, or hernia.  Pfannenstiel incision healed.  MSK:  No focal spinal tenderness to palpation. Full range of motion bilaterally in the upper extremities. EXTREMITIES:  No peripheral edema.   NEURO:  Nonfocal. Well oriented.  Appropriate affect.  Pelvic: Chaperoned by CMA EGBUS: no lesions Cervix: pushed over to her right and unable to see os.  Vagina: no lesions, no discharge or bleeding Uterus: enlarged above the umbilicus with large fibroid masses extending to the right sidewall. Limited mobility. Adnexa: limited by enlarged uterus. Suspect findings on the right represent lateral uterine fibroid.  Rectovaginal: deferred   Lab Review N/a  Radiologic Imaging: I independently reviewed below imaging and agree with findings as discussed in assessment and plan.  11/16/20 CT Abdomen Pelvis W Contrast- -  10.7 cm left upper pole renal mass, compatible with solid renal neoplasm such as renal cell carcinoma. - No findings suspicious for metastatic disease. - Cholelithiasis, without associated inflammatory changes. - Uterine fibroids, including a dominant 14.5 cm subserosal fibroid in the left uterine fundus.         Assessment:  Mariah Lang is a 50 y.o. female diagnosed with enlarging symptomatic uterine fibroids and renal mass. Differential for the uterine fibroids includes leiomyosarcoma given rapid enlargement over the past 2 years, MRI reassuring.   Complex cystic lesion in the left adnexa which may represent tubal etiology vs ovarian.  Mariah Lang confirms postmenopausal bleeding. Unable to sample due to uterine size and leiomyoma  Medical co-morbidities complicating care: prior C-section x 2. Body mass index is 36.13 kg/m.  Plan:   Problem List Items Addressed This Visit       Genitourinary   Fibroid uterus - Primary   Other Visit Diagnoses     Postmenopausal vaginal bleeding       Counseling and coordination of care            Plan to proceed with surgery. We will attempt minimally invasive approach. Dr. Erlene Quan will be removing her left kidney. Plan for intraoperative right stent placement for her surgery.   Risks were discussed in detail. These include infection, anesthesia, bleeding, transfusion, wound separation, vaginal cuff dehiscence, medical issues (blood clots, stroke, heart attack, fluid in the lungs, pneumonia, abnormal heart rhythm, death), possible exploratory surgery with larger incision, lymphedema, lymphocyst, allergic reaction, injury to adjacent organs (bowel, bladder, blood vessels, nerves, ureters, uterus)  She strongly desires hysterectomy.   She will need Ancef - weight based/Flagyl 500 mg IV preop for gyn procedure, SCDs, and prophylactic heparin.   Gyn VTE Prophylaxis Algorithm  Risk factors for VTE (calculator):  Point value Risk  factors  1 point each age 4-60  2 points each BMI >30  3 points each none in this category  5 points each  none in this category   not concerning for malignancy, no personal/family history of VTE.  Major surgery (>30 min); 0-3 points; Moderate risk - SCDs/compression stockings and heparin  The patient's diagnosis, an outline of the further diagnostic and laboratory studies which will be required, the recommendation for surgery, and alternatives were discussed with her and her accompanying family members.  All questions were answered to their satisfaction.  A total of at least 80 minutes were spent with the patient/family today; >50% was spent in education, counseling and coordination of care  for leiomyoma.  I personally had a face to face interaction and evaluated the patient jointly with the NP, Ms. Beckey Rutter.  I have reviewed her history and available records and have performed all portions of the physical exam with my findings confirming those documented above by the APP.  I have discussed the case with the APP and the patient.  I agree with the above documentation, assessment and plan which was fully formulated by me.  Counseling was completed by me.   I personally saw the patient and performed a substantive portion of this encounter in conjunction with the listed APP as documented above.  Lenora Gomes Gaetana Michaelis, MD  ADDENDUM:  I will discuss possible oophorectomy with her the morning of surgery.  Gabrelle Roca Gaetana Michaelis, MD

## 2021-01-06 NOTE — Patient Instructions (Signed)
Obtain FMLA papers from work take them to Dr. Cherrie Gauze office.

## 2021-01-11 NOTE — Progress Notes (Signed)
[facility] Posting Form   Surgery Date/Time: Date: 02/17/2021  Surgeon: Dr. Hollice Espy with Dr. Nickolas Madrid, MD assisting.   Surgery Location: Day Surgery  Inpt ( Yes  )   Outpt (No)   Obs ( No  )   Diagnosis: N28.89 Left Renal Mass  -CPT: 92446   Surgery: Left Robotic radical nephrectomy  -CPT: Possible 50546  Surgery: Possible Left hand assist laparoscopic radical nephrectomy   -CPT: Possible 28638  Surgery: Possible Left Open radical nephrectomy   Stop Anticoagulations: Yes; Hold ASA as well  Cardiac/Medical/Pulmonary Clearance needed: None Needed  *Orders entered into EPIC  Date: 01/11/21   *Case booked in EPIC  Date: 01/08/2021  *Notified pt of Surgery: Date: 01/08/2021  PRE-OP UA & CX: Yes, Also get CBC, BMP, INR, Type and Screen, Crossmatch with 4 Units   *Placed into Prior Authorization Work Que Date: 01/11/21   Assistant/laser/rep:No

## 2021-02-09 ENCOUNTER — Other Ambulatory Visit: Payer: Self-pay

## 2021-02-09 ENCOUNTER — Encounter
Admission: RE | Admit: 2021-02-09 | Discharge: 2021-02-09 | Disposition: A | Discharge status: Home / Self Care | Payer: Self-pay | Source: Ambulatory Visit | Attending: Obstetrics and Gynecology | Admitting: Obstetrics and Gynecology

## 2021-02-09 DIAGNOSIS — Z01818 Encounter for other preprocedural examination: Secondary | ICD-10-CM | POA: Insufficient documentation

## 2021-02-09 DIAGNOSIS — N2889 Other specified disorders of kidney and ureter: Secondary | ICD-10-CM | POA: Insufficient documentation

## 2021-02-09 LAB — CBC
HCT: 43.4 % (ref 36.0–46.0)
Hemoglobin: 14 g/dL (ref 12.0–15.0)
MCH: 26.3 pg (ref 26.0–34.0)
MCHC: 32.3 g/dL (ref 30.0–36.0)
MCV: 81.4 fL (ref 80.0–100.0)
Platelets: 463 10*3/uL — ABNORMAL HIGH (ref 150–400)
RBC: 5.33 MIL/uL — ABNORMAL HIGH (ref 3.87–5.11)
RDW: 16.7 % — ABNORMAL HIGH (ref 11.5–15.5)
WBC: 7.2 10*3/uL (ref 4.0–10.5)
nRBC: 0 % (ref 0.0–0.2)

## 2021-02-09 LAB — URINALYSIS, COMPLETE (UACMP) WITH MICROSCOPIC
Bacteria, UA: NONE SEEN
Bilirubin Urine: NEGATIVE
Glucose, UA: NEGATIVE mg/dL
Ketones, ur: NEGATIVE mg/dL
Nitrite: NEGATIVE
Protein, ur: NEGATIVE mg/dL
Specific Gravity, Urine: 1.021 (ref 1.005–1.030)
Squamous Epithelial / HPF: NONE SEEN (ref 0–5)
pH: 5 (ref 5.0–8.0)

## 2021-02-09 LAB — BASIC METABOLIC PANEL
Anion gap: 8 (ref 5–15)
BUN: 18 mg/dL (ref 6–20)
CO2: 24 mmol/L (ref 22–32)
Calcium: 9.5 mg/dL (ref 8.9–10.3)
Chloride: 106 mmol/L (ref 98–111)
Creatinine, Ser: 0.48 mg/dL (ref 0.44–1.00)
GFR, Estimated: >60 mL/min (ref >60)
Glucose, Bld: 87 mg/dL (ref 70–99)
Potassium: 3.8 mmol/L (ref 3.5–5.1)
Sodium: 138 mmol/L (ref 135–145)

## 2021-02-09 LAB — PROTIME-INR
INR: 1 (ref 0.8–1.2)
Prothrombin Time: 12.7 seconds (ref 11.4–15.2)

## 2021-02-09 NOTE — Patient Instructions (Signed)
Your procedure is scheduled on: Wednesday February 17, 2021. Su procedimiento est programado para: [place] 2022. Report to Day Surgery inside [facility] 2nd floor. Presntese a: [facility] 2ndo piso. To find out your arrival time please call [phone] between 1PM - 3PM on Tuesday February 16, 2021. Para saber su hora de llegada por favor llame al [phone] entre la 1PM - [place]: [place] 2022.  Remember: Instructions that are not followed completely may result in serious medical risk, up to and including death,  or upon the discretion of your surgeon and anesthesiologist your surgery may need to be rescheduled.  Recuerde: Las instrucciones que no se siguen completamente Heritage manager en un riesgo de salud grave, incluyendo hasta  la [place] o a discrecin de su cirujano y Environmental health practitioner, su ciruga se puede posponer.   __X_ 1.Do not eat food or drink fluids after midnight the night before your procedure. No    gum chewing or hard candies.      No coma nada ni beba liquidos despus de la medianoche de la noche anterior a su    procedimiento. No coma chicles ni caramelos duros.                _X__ 2.Do Not Smoke or use e-cigarettes For 24 Hours Prior to Your Surgery.    Do not use any chewable tobacco products for at least 6   hours prior to surgery.    No fume ni use cigarrillos electrnicos durante las 24 horas previas    a su [place].  No use ningn producto de tabaco masticable durante   al menos 6 horas antes de la [place].     __X_ 3. No alcohol for 24 hours before or after surgery.    No tome alcohol durante las 24 horas antes ni despus de la [place].   __X__4. On the morning of surgery brush your teeth with toothpaste and water, you                may rinse your mouth with mouthwash if you wish.  Do not swallow any toothpaste of mouthwash.   En la maana de la [place], cepllese los dientes  con pasta de dientes y [place],                [place] enjuagarse la boca con enjuague bucal si lo desea. No ingiera ninguna pasta de dientes o enjuague bucal.   __X__ 5. Notify your doctor if there is any change in your medical condition (cold,fever, infections).    Informe a su mdico si hay algn cambio en su condicin mdica  (resfriado, fiebre, infecciones).   Do not wear jewelry, make-up, hairpins, clips or nail polish.  No use joyas, maquillajes, pinzas/ganchos para el cabello ni esmalte de uas.  Do not wear lotions, powders, or perfumes. You may wear deodorant.  No use lociones, polvos o perfumes.  Puede usar desodorante.    Do not shave 48 hours prior to surgery. Men may shave face and neck.  No se afeite 48 horas antes de la [place].  Los hombres pueden [employer] cara  y el cuello.   Do not bring valuables to the hospital.   No lleve objetos [facility] is not responsible for any belongings or valuables.  [facility] no se hace responsable de ningn tipo de pertenencias u objetos de Geographical information systems officer.  Contacts, dentures or bridgework may not be worn into surgery.  Los lentes de [place], las dentaduras postizas o puentes no se pueden usar en la [place].   Leave your suitcase in the car. After surgery it may be brought to your room.  Deje su maleta en el auto.  Despus de la ciruga podr traerla a su habitacin.   For patients admitted to the hospital, discharge time is determined by your  treatment team.  Para los pacientes que sean ingresados al hospital, el tiempo en el cual se le  dar de alta es determinado por su equipo de [place].   Patients discharged the day of surgery will not be allowed to drive home. A los pacientes que se les da de alta el mismo da de la ciruga no se les permitir conducir a Holiday representative.   __X__ Take these medicines the morning of surgery with A SIP OF WATER:          [employer] estas medicinas la maana de la ciruga con UN  SORBO DE AGUA:  1. None   2.   3.   4.       5.  6.  ____ Fleet Enema (as directed)          Enema de Fleet (segn lo indicado)    __X__ Use CHG Soap as directed          Utilice el jabn de CHG segn lo indicado  ____ Use inhalers on the day of surgery          Use los inhaladores el da de la ciruga  ____ Stop metformin 2 days prior to surgery          Deje de tomar el metformin 2 das antes de la ciruga    ____ Take 1/2 of usual insulin dose the night before surgery and none on the morning of surgery           Tome la mitad de la dosis habitual de insulina la noche antes de la [place] y no tome nada en la maana de la             ciruga  ____ Stop Coumadin/Plavix/aspirin on           Deje de tomar el Coumadin/Plavix/aspirina el da:  __X__ Stop Anti-inflammatories on such as ibuprofen (ADVIL), meloxicam (MOBIC), naproxen, aspirins and or products containing aspirin like Goody's or BC powders.           Deje de tomar Regulatory affairs officer: ibuprofen, advil, meloxicam, Mobic, naproxen, aspirinas, o productos que continen aspirina como polvos de Goodys o BC Powders.   __X__ Stop supplements until after surgery Omega-3 Fatty Acids (FISH OIL OMEGA-3           Deje de tomar suplementos hasta despus de la ciruga  ____ Bring C-Pap to the hospital          [place] al hospital

## 2021-02-10 LAB — URINE CULTURE: Culture: 30000 — AB

## 2021-02-15 ENCOUNTER — Other Ambulatory Visit
Admission: RE | Admit: 2021-02-15 | Discharge: 2021-02-15 | Disposition: A | Discharge status: Home / Self Care | Payer: Self-pay | Source: Ambulatory Visit | Attending: Obstetrics and Gynecology | Admitting: Obstetrics and Gynecology

## 2021-02-15 ENCOUNTER — Other Ambulatory Visit: Payer: Self-pay

## 2021-02-15 DIAGNOSIS — Z20822 Contact with and (suspected) exposure to covid-19: Secondary | ICD-10-CM | POA: Insufficient documentation

## 2021-02-15 DIAGNOSIS — Z01812 Encounter for preprocedural laboratory examination: Secondary | ICD-10-CM | POA: Insufficient documentation

## 2021-02-15 LAB — SARS CORONAVIRUS 2 (TAT 6-24 HRS): SARS Coronavirus 2: NEGATIVE

## 2021-02-17 ENCOUNTER — Encounter
Admission: RE | Disposition: A | Discharge status: Home / Self Care | Payer: Self-pay | Source: Home / Self Care | Attending: Urology

## 2021-02-17 ENCOUNTER — Inpatient Hospital Stay
Admission: RE | Admit: 2021-02-17 | Discharge: 2021-02-21 | DRG: 656 | Disposition: A | Discharge status: Home / Self Care | Payer: Self-pay | Attending: Urology | Admitting: Urology

## 2021-02-17 ENCOUNTER — Encounter: Payer: Self-pay | Admitting: Obstetrics and Gynecology

## 2021-02-17 ENCOUNTER — Inpatient Hospital Stay: Payer: Self-pay | Admitting: Anesthesiology

## 2021-02-17 ENCOUNTER — Inpatient Hospital Stay: Payer: Self-pay | Admitting: Urgent Care

## 2021-02-17 ENCOUNTER — Other Ambulatory Visit: Payer: Self-pay

## 2021-02-17 DIAGNOSIS — J690 Pneumonitis due to inhalation of food and vomit: Secondary | ICD-10-CM

## 2021-02-17 DIAGNOSIS — C642 Malignant neoplasm of left kidney, except renal pelvis: Principal | ICD-10-CM | POA: Diagnosis present

## 2021-02-17 DIAGNOSIS — D62 Acute posthemorrhagic anemia: Secondary | ICD-10-CM | POA: Diagnosis not present

## 2021-02-17 DIAGNOSIS — E669 Obesity, unspecified: Secondary | ICD-10-CM | POA: Diagnosis present

## 2021-02-17 DIAGNOSIS — Z20822 Contact with and (suspected) exposure to covid-19: Secondary | ICD-10-CM | POA: Diagnosis present

## 2021-02-17 DIAGNOSIS — E871 Hypo-osmolality and hyponatremia: Secondary | ICD-10-CM

## 2021-02-17 DIAGNOSIS — N736 Female pelvic peritoneal adhesions (postinfective): Secondary | ICD-10-CM | POA: Diagnosis present

## 2021-02-17 DIAGNOSIS — D509 Iron deficiency anemia, unspecified: Secondary | ICD-10-CM | POA: Diagnosis present

## 2021-02-17 DIAGNOSIS — Z833 Family history of diabetes mellitus: Secondary | ICD-10-CM

## 2021-02-17 DIAGNOSIS — D75839 Thrombocytosis, unspecified: Secondary | ICD-10-CM | POA: Diagnosis present

## 2021-02-17 DIAGNOSIS — Z6837 Body mass index (BMI) 37.0-37.9, adult: Secondary | ICD-10-CM

## 2021-02-17 DIAGNOSIS — D259 Leiomyoma of uterus, unspecified: Secondary | ICD-10-CM

## 2021-02-17 DIAGNOSIS — N2889 Other specified disorders of kidney and ureter: Secondary | ICD-10-CM

## 2021-02-17 DIAGNOSIS — K567 Ileus, unspecified: Secondary | ICD-10-CM | POA: Diagnosis not present

## 2021-02-17 DIAGNOSIS — Z419 Encounter for procedure for purposes other than remedying health state, unspecified: Secondary | ICD-10-CM

## 2021-02-17 DIAGNOSIS — D75838 Other thrombocytosis: Secondary | ICD-10-CM | POA: Diagnosis present

## 2021-02-17 DIAGNOSIS — C649 Malignant neoplasm of unspecified kidney, except renal pelvis: Secondary | ICD-10-CM | POA: Diagnosis present

## 2021-02-17 DIAGNOSIS — Z8249 Family history of ischemic heart disease and other diseases of the circulatory system: Secondary | ICD-10-CM

## 2021-02-17 DIAGNOSIS — J9601 Acute respiratory failure with hypoxia: Secondary | ICD-10-CM

## 2021-02-17 DIAGNOSIS — D252 Subserosal leiomyoma of uterus: Secondary | ICD-10-CM | POA: Diagnosis present

## 2021-02-17 DIAGNOSIS — I2699 Other pulmonary embolism without acute cor pulmonale: Secondary | ICD-10-CM

## 2021-02-17 HISTORY — PX: ROBOTIC ASSISTED LAPAROSCOPIC HYSTERECTOMY AND SALPINGECTOMY: SHX6379

## 2021-02-17 HISTORY — PX: LAPAROSCOPIC NEPHRECTOMY, HAND ASSISTED: SHX1929

## 2021-02-17 HISTORY — PX: LAPAROTOMY: SHX154

## 2021-02-17 LAB — GLUCOSE, CAPILLARY: Glucose-Capillary: 92 mg/dL (ref 70–99)

## 2021-02-17 LAB — ABO/RH: ABO/RH(D): A POS

## 2021-02-17 SURGERY — LAPAROTOMY
Anesthesia: General | Laterality: Left

## 2021-02-17 MED ORDER — ROCURONIUM BROMIDE 100 MG/10ML IV SOLN
INTRAVENOUS | Status: DC | PRN
  Administered 2021-02-17 (×2): 10 mg via INTRAVENOUS
  Administered 2021-02-17: 20 mg via INTRAVENOUS
  Administered 2021-02-17: 10 mg via INTRAVENOUS
  Administered 2021-02-17: 50 mg via INTRAVENOUS
  Administered 2021-02-17: 10 mg via INTRAVENOUS
  Administered 2021-02-17: 50 mg via INTRAVENOUS
  Administered 2021-02-17: 10 mg via INTRAVENOUS

## 2021-02-17 MED ORDER — LIDOCAINE HCL (CARDIAC) PF 100 MG/5ML IV SOSY
PREFILLED_SYRINGE | INTRAVENOUS | Status: DC | PRN
  Administered 2021-02-17: 20 mg via INTRAVENOUS
  Administered 2021-02-17: 80 mg via INTRAVENOUS

## 2021-02-17 MED ORDER — SODIUM CHLORIDE 0.9 % IV SOLN: INTRAVENOUS | Status: DC

## 2021-02-17 MED ORDER — DIPHENHYDRAMINE HCL 50 MG/ML IJ SOLN: 12.5000 mg | Freq: Four times a day (QID) | INTRAMUSCULAR | Status: DC | PRN

## 2021-02-17 MED ORDER — KETAMINE HCL 50 MG/5ML IJ SOSY
PREFILLED_SYRINGE | INTRAMUSCULAR | Status: AC
  Filled 2021-02-17: qty 5

## 2021-02-17 MED ORDER — DEXMEDETOMIDINE (PRECEDEX) IN NS 20 MCG/5ML (4 MCG/ML) IV SYRINGE
PREFILLED_SYRINGE | INTRAVENOUS | Status: AC
  Filled 2021-02-17: qty 5

## 2021-02-17 MED ORDER — CHLORHEXIDINE GLUCONATE 0.12 % MT SOLN: 15.0000 mL | Freq: Once | OROMUCOSAL | Status: AC

## 2021-02-17 MED ORDER — FENTANYL CITRATE (PF) 100 MCG/2ML IJ SOLN
INTRAMUSCULAR | Status: AC
  Filled 2021-02-17: qty 2

## 2021-02-17 MED ORDER — DOCUSATE SODIUM 100 MG PO CAPS
100.0000 mg | ORAL_CAPSULE | Freq: Two times a day (BID) | ORAL | Status: DC
  Administered 2021-02-17 – 2021-02-21 (×8): 100 mg via ORAL
  Filled 2021-02-17 (×8): qty 1

## 2021-02-17 MED ORDER — SODIUM CHLORIDE 0.9 % IV SOLN: INTRAVENOUS | Status: DC | PRN

## 2021-02-17 MED ORDER — DEXAMETHASONE SODIUM PHOSPHATE 10 MG/ML IJ SOLN
INTRAMUSCULAR | Status: DC | PRN
  Administered 2021-02-17: 10 mg via INTRAVENOUS

## 2021-02-17 MED ORDER — VASOPRESSIN 20 UNIT/ML IV SOLN
INTRAVENOUS | Status: AC
  Filled 2021-02-17: qty 1

## 2021-02-17 MED ORDER — PROPOFOL 10 MG/ML IV BOLUS
INTRAVENOUS | Status: AC
  Filled 2021-02-17: qty 40

## 2021-02-17 MED ORDER — PHENYLEPHRINE HCL-NACL 20-0.9 MG/250ML-% IV SOLN
INTRAVENOUS | Status: DC | PRN
  Administered 2021-02-17: 50 ug/min via INTRAVENOUS

## 2021-02-17 MED ORDER — PROMETHAZINE HCL 25 MG/ML IJ SOLN
INTRAMUSCULAR | Status: AC
  Filled 2021-02-17: qty 1

## 2021-02-17 MED ORDER — DEXAMETHASONE SODIUM PHOSPHATE 10 MG/ML IJ SOLN
INTRAMUSCULAR | Status: AC
  Filled 2021-02-17: qty 1

## 2021-02-17 MED ORDER — EPHEDRINE 5 MG/ML INJ
INTRAVENOUS | Status: AC
  Filled 2021-02-17: qty 5

## 2021-02-17 MED ORDER — SODIUM CHLORIDE 0.9 % IR SOLN
Status: DC | PRN
  Administered 2021-02-17: 50 mL

## 2021-02-17 MED ORDER — METRONIDAZOLE 500 MG/100ML IV SOLN
500.0000 mg | Freq: Once | INTRAVENOUS | Status: AC
  Administered 2021-02-17: 500 mg via INTRAVENOUS
  Filled 2021-02-17: qty 100

## 2021-02-17 MED ORDER — CEFAZOLIN SODIUM 1 G IJ SOLR
INTRAMUSCULAR | Status: AC
  Filled 2021-02-17: qty 20

## 2021-02-17 MED ORDER — GLYCOPYRROLATE 0.2 MG/ML IJ SOLN
INTRAMUSCULAR | Status: AC
  Filled 2021-02-17: qty 1

## 2021-02-17 MED ORDER — ONDANSETRON HCL 4 MG/2ML IJ SOLN: 4.0000 mg | Freq: Once | INTRAMUSCULAR | Status: DC | PRN

## 2021-02-17 MED ORDER — FAMOTIDINE 20 MG PO TABS: 20.0000 mg | ORAL_TABLET | Freq: Once | ORAL | Status: AC

## 2021-02-17 MED ORDER — ORAL CARE MOUTH RINSE: 15.0000 mL | Freq: Once | OROMUCOSAL | Status: AC

## 2021-02-17 MED ORDER — MIDAZOLAM HCL 2 MG/2ML IJ SOLN
INTRAMUSCULAR | Status: DC | PRN
  Administered 2021-02-17: 2 mg via INTRAVENOUS

## 2021-02-17 MED ORDER — MORPHINE SULFATE (PF) 2 MG/ML IV SOLN
2.0000 mg | INTRAVENOUS | Status: DC | PRN
  Administered 2021-02-17 – 2021-02-18 (×3): 4 mg via INTRAVENOUS
  Administered 2021-02-18: 2 mg via INTRAVENOUS
  Administered 2021-02-19 (×2): 4 mg via INTRAVENOUS
  Administered 2021-02-19: 2 mg via INTRAVENOUS
  Administered 2021-02-19: 4 mg via INTRAVENOUS
  Administered 2021-02-20 (×3): 2 mg via INTRAVENOUS
  Filled 2021-02-17 (×2): qty 1
  Filled 2021-02-17 (×2): qty 2
  Filled 2021-02-17 (×2): qty 1
  Filled 2021-02-17 (×5): qty 2

## 2021-02-17 MED ORDER — DIPHENHYDRAMINE HCL 12.5 MG/5ML PO ELIX
12.5000 mg | ORAL_SOLUTION | Freq: Four times a day (QID) | ORAL | Status: DC | PRN
  Filled 2021-02-17: qty 5

## 2021-02-17 MED ORDER — SEVOFLURANE IN SOLN
RESPIRATORY_TRACT | Status: AC
  Filled 2021-02-17: qty 250

## 2021-02-17 MED ORDER — SODIUM CHLORIDE FLUSH 0.9 % IV SOLN
INTRAVENOUS | Status: AC
  Filled 2021-02-17: qty 10

## 2021-02-17 MED ORDER — BUPIVACAINE LIPOSOME 1.3 % IJ SUSP
INTRAMUSCULAR | Status: DC | PRN
  Administered 2021-02-17: 40 mL

## 2021-02-17 MED ORDER — ROCURONIUM BROMIDE 10 MG/ML (PF) SYRINGE
PREFILLED_SYRINGE | INTRAVENOUS | Status: AC
  Filled 2021-02-17: qty 10

## 2021-02-17 MED ORDER — FENTANYL CITRATE (PF) 100 MCG/2ML IJ SOLN
INTRAMUSCULAR | Status: AC
  Administered 2021-02-17: 25 ug via INTRAVENOUS
  Filled 2021-02-17: qty 2

## 2021-02-17 MED ORDER — HEMOSTATIC AGENTS (NO CHARGE) OPTIME
TOPICAL | Status: DC | PRN
  Administered 2021-02-17 (×2): 1 via TOPICAL

## 2021-02-17 MED ORDER — WATER FOR IRRIGATION, STERILE IR SOLN
Status: DC | PRN
  Administered 2021-02-17: 200 mL

## 2021-02-17 MED ORDER — PROMETHAZINE HCL 25 MG/ML IJ SOLN
6.2500 mg | INTRAMUSCULAR | Status: AC
  Administered 2021-02-17: 6.25 mg via INTRAVENOUS

## 2021-02-17 MED ORDER — HEPARIN SODIUM (PORCINE) 5000 UNIT/ML IJ SOLN: 5000.0000 [IU] | Freq: Once | INTRAMUSCULAR | Status: AC

## 2021-02-17 MED ORDER — CHLORHEXIDINE GLUCONATE CLOTH 2 % EX PADS
6.0000 | MEDICATED_PAD | Freq: Every day | CUTANEOUS | Status: DC
Start: 2021-02-18 — End: 2021-02-18

## 2021-02-17 MED ORDER — FAMOTIDINE 20 MG PO TABS
ORAL_TABLET | ORAL | Status: AC
  Administered 2021-02-17: 20 mg via ORAL
  Filled 2021-02-17: qty 1

## 2021-02-17 MED ORDER — CEFAZOLIN SODIUM-DEXTROSE 2-4 GM/100ML-% IV SOLN
2.0000 g | INTRAVENOUS | Status: AC
  Administered 2021-02-17 (×2): 2 g via INTRAVENOUS

## 2021-02-17 MED ORDER — ALBUMIN HUMAN 5 % IV SOLN
INTRAVENOUS | Status: AC
  Filled 2021-02-17: qty 500

## 2021-02-17 MED ORDER — HEPARIN SODIUM (PORCINE) 5000 UNIT/ML IJ SOLN
5000.0000 [IU] | Freq: Three times a day (TID) | INTRAMUSCULAR | Status: DC
  Administered 2021-02-17 – 2021-02-19 (×5): 5000 [IU] via SUBCUTANEOUS
  Filled 2021-02-17 (×5): qty 1

## 2021-02-17 MED ORDER — HEPARIN SODIUM (PORCINE) 5000 UNIT/ML IJ SOLN
INTRAMUSCULAR | Status: AC
  Administered 2021-02-17: 5000 [IU] via SUBCUTANEOUS
  Filled 2021-02-17: qty 1

## 2021-02-17 MED ORDER — PHENYLEPHRINE HCL (PRESSORS) 10 MG/ML IV SOLN
INTRAVENOUS | Status: DC | PRN
  Administered 2021-02-17 (×2): 160 ug via INTRAVENOUS

## 2021-02-17 MED ORDER — CHLORHEXIDINE GLUCONATE 0.12 % MT SOLN
OROMUCOSAL | Status: AC
  Administered 2021-02-17: 15 mL via OROMUCOSAL
  Filled 2021-02-17: qty 15

## 2021-02-17 MED ORDER — SUGAMMADEX SODIUM 200 MG/2ML IV SOLN
INTRAVENOUS | Status: DC | PRN
  Administered 2021-02-17: 200 mg via INTRAVENOUS

## 2021-02-17 MED ORDER — EPHEDRINE SULFATE 50 MG/ML IJ SOLN
INTRAMUSCULAR | Status: DC | PRN
  Administered 2021-02-17: 10 mg via INTRAVENOUS
  Administered 2021-02-17: 5 mg via INTRAVENOUS

## 2021-02-17 MED ORDER — LIDOCAINE HCL (PF) 2 % IJ SOLN
INTRAMUSCULAR | Status: AC
  Filled 2021-02-17: qty 5

## 2021-02-17 MED ORDER — FENTANYL CITRATE (PF) 100 MCG/2ML IJ SOLN
25.0000 ug | INTRAMUSCULAR | Status: DC | PRN
  Administered 2021-02-17 (×3): 25 ug via INTRAVENOUS

## 2021-02-17 MED ORDER — MEPERIDINE HCL 25 MG/ML IJ SOLN: 6.2500 mg | INTRAMUSCULAR | Status: DC | PRN

## 2021-02-17 MED ORDER — ONDANSETRON HCL 4 MG/2ML IJ SOLN
INTRAMUSCULAR | Status: AC
  Filled 2021-02-17: qty 2

## 2021-02-17 MED ORDER — KETAMINE HCL 10 MG/ML IJ SOLN
INTRAMUSCULAR | Status: DC | PRN
  Administered 2021-02-17: 40 mg via INTRAVENOUS
  Administered 2021-02-17 (×4): 10 mg via INTRAVENOUS

## 2021-02-17 MED ORDER — BUPIVACAINE HCL (PF) 0.25 % IJ SOLN
INTRAMUSCULAR | Status: AC
  Filled 2021-02-17: qty 60

## 2021-02-17 MED ORDER — PROPOFOL 10 MG/ML IV BOLUS
INTRAVENOUS | Status: DC | PRN
  Administered 2021-02-17: 160 mg via INTRAVENOUS
  Administered 2021-02-17: 20 mg via INTRAVENOUS

## 2021-02-17 MED ORDER — ONDANSETRON HCL 4 MG/2ML IJ SOLN: 4.0000 mg | INTRAMUSCULAR | Status: DC | PRN

## 2021-02-17 MED ORDER — OXYCODONE-ACETAMINOPHEN 5-325 MG PO TABS
1.0000 | ORAL_TABLET | ORAL | Status: DC | PRN
  Administered 2021-02-17 – 2021-02-19 (×10): 2 via ORAL
  Filled 2021-02-17 (×10): qty 2

## 2021-02-17 MED ORDER — ACETAMINOPHEN 10 MG/ML IV SOLN
INTRAVENOUS | Status: DC | PRN
  Administered 2021-02-17: 1000 mg via INTRAVENOUS

## 2021-02-17 MED ORDER — ACETAMINOPHEN 325 MG PO TABS
650.0000 mg | ORAL_TABLET | ORAL | Status: DC | PRN
  Administered 2021-02-18: 12:00:00 650 mg via ORAL
  Filled 2021-02-17: qty 2

## 2021-02-17 MED ORDER — CEFAZOLIN SODIUM-DEXTROSE 2-4 GM/100ML-% IV SOLN
INTRAVENOUS | Status: AC
  Filled 2021-02-17: qty 100

## 2021-02-17 MED ORDER — MIDAZOLAM HCL 2 MG/2ML IJ SOLN
INTRAMUSCULAR | Status: AC
  Filled 2021-02-17: qty 2

## 2021-02-17 MED ORDER — BUPIVACAINE LIPOSOME 1.3 % IJ SUSP
INTRAMUSCULAR | Status: AC
  Filled 2021-02-17: qty 20

## 2021-02-17 MED ORDER — FENTANYL CITRATE (PF) 100 MCG/2ML IJ SOLN
INTRAMUSCULAR | Status: DC | PRN
  Administered 2021-02-17 (×3): 50 ug via INTRAVENOUS

## 2021-02-17 MED ORDER — PHENYLEPHRINE HCL-NACL 20-0.9 MG/250ML-% IV SOLN
INTRAVENOUS | Status: AC
  Filled 2021-02-17: qty 250

## 2021-02-17 MED ORDER — VASOPRESSIN 20 UNIT/ML IV SOLN
INTRAVENOUS | Status: DC | PRN
  Administered 2021-02-17: 30 mL via INTRAMUSCULAR

## 2021-02-17 MED ORDER — DEXMEDETOMIDINE (PRECEDEX) IN NS 20 MCG/5ML (4 MCG/ML) IV SYRINGE
PREFILLED_SYRINGE | INTRAVENOUS | Status: DC | PRN
  Administered 2021-02-17: 4 ug via INTRAVENOUS
  Administered 2021-02-17: 8 ug via INTRAVENOUS
  Administered 2021-02-17: 4 ug via INTRAVENOUS
  Administered 2021-02-17: 8 ug via INTRAVENOUS
  Administered 2021-02-17 (×2): 4 ug via INTRAVENOUS
  Administered 2021-02-17: 8 ug via INTRAVENOUS

## 2021-02-17 MED ORDER — ACETAMINOPHEN 10 MG/ML IV SOLN
INTRAVENOUS | Status: AC
  Filled 2021-02-17: qty 100

## 2021-02-17 MED ORDER — ONDANSETRON HCL 4 MG/2ML IJ SOLN
INTRAMUSCULAR | Status: DC | PRN
  Administered 2021-02-17 (×2): 4 mg via INTRAVENOUS

## 2021-02-17 MED ORDER — OXYBUTYNIN CHLORIDE 5 MG PO TABS
5.0000 mg | ORAL_TABLET | Freq: Three times a day (TID) | ORAL | Status: DC | PRN
  Filled 2021-02-17 (×2): qty 1

## 2021-02-17 MED ORDER — OXYBUTYNIN CHLORIDE ER 10 MG PO TB24
10.0000 mg | ORAL_TABLET | Freq: Every day | ORAL | Status: DC
  Administered 2021-02-17 – 2021-02-19 (×3): 10 mg via ORAL
  Filled 2021-02-17 (×4): qty 1

## 2021-02-17 MED ORDER — CEFAZOLIN SODIUM-DEXTROSE 1-4 GM/50ML-% IV SOLN
1.0000 g | Freq: Three times a day (TID) | INTRAVENOUS | Status: AC
  Administered 2021-02-17 – 2021-02-18 (×2): 1 g via INTRAVENOUS
  Filled 2021-02-17 (×3): qty 50

## 2021-02-17 SURGICAL SUPPLY — 215 items
"PENCIL ELECTRO HAND CTR " (MISCELLANEOUS) ×6 IMPLANT
0 CT 1 Vicryl ×2 IMPLANT
ANCHOR TIS RET SYS 235ML (MISCELLANEOUS) IMPLANT
APPLICATOR SURGIFLO ENDO (HEMOSTASIS) ×4 IMPLANT
APPLIER CLIP LOGIC TI 5 (MISCELLANEOUS) IMPLANT
APPLIER CLIP ROT 10 11.4 M/L (STAPLE)
APPLIER CLIP ROT 13.4 12 LRG (CLIP)
BAG DECANTER FOR FLEXI CONT (MISCELLANEOUS) ×1 IMPLANT
BAG LAPAROSCOPIC 12 15 PORT 16 (BASKET) ×3 IMPLANT
BAG RETRIEVAL 12/15 (BASKET) ×8
BAG URINE DRAIN 2000ML AR STRL (UROLOGICAL SUPPLIES) ×4 IMPLANT
BLADE EXTENDED COATED 6.5IN (ELECTRODE) ×4 IMPLANT
BLADE SURG SZ11 CARB STEEL (BLADE) ×4 IMPLANT
BULB RESERV EVAC DRAIN JP 100C (MISCELLANEOUS) IMPLANT
CANNULA DILATOR 5 W/SLV (CANNULA) IMPLANT
CANNULA REDUC XI 12-8 STAPL (CANNULA) ×1
CANNULA REDUCER 12-8 DVNC XI (CANNULA) IMPLANT
CATH FOLEY 2WAY  5CC 16FR (CATHETERS) ×1
CATH ROBINSON RED A/P 16FR (CATHETERS) ×1 IMPLANT
CATH URTH 16FR FL 2W BLN LF (CATHETERS) ×3 IMPLANT
CHLORAPREP W/TINT 26 (MISCELLANEOUS) ×11 IMPLANT
CLEANER CAUTERY TIP 5X5 PAD (MISCELLANEOUS) ×3 IMPLANT
CLIP APPLIE ROT 10 11.4 M/L (STAPLE) IMPLANT
CLIP APPLIE ROT 13.4 12 LRG (CLIP) IMPLANT
CLIP LIGATING HEM O LOK PURPLE (MISCELLANEOUS) ×16 IMPLANT
CLIP LIGATING HEMO O LOK GREEN (MISCELLANEOUS) ×2 IMPLANT
CLIP SUT LAPRA TY ABSORB (SUTURE) IMPLANT
CLIP VESOLOCK MED LG 6/CT (CLIP) ×4 IMPLANT
CNTNR SPEC 2.5X3XGRAD LEK (MISCELLANEOUS) ×3
CONNECTOR 5 IN 1 STRAIGHT STRL (MISCELLANEOUS) ×1 IMPLANT
CONT SPEC 4OZ STER OR WHT (MISCELLANEOUS) ×1
CONTAINER SPEC 2.5X3XGRAD LEK (MISCELLANEOUS) ×3 IMPLANT
COUNTER NEEDLE 20/40 LG (NEEDLE) ×1 IMPLANT
COVER LIGHT HANDLE STERIS (MISCELLANEOUS) ×4 IMPLANT
COVER TIP SHEARS 8 DVNC (MISCELLANEOUS) ×6 IMPLANT
COVER TIP SHEARS 8MM DA VINCI (MISCELLANEOUS) ×3
CUTTER ECHEON FLEX ENDO 45 340 (ENDOMECHANICALS) IMPLANT
DECANTER SPIKE VIAL GLASS SM (MISCELLANEOUS) IMPLANT
DEFOGGER SCOPE WARMER CLEARIFY (MISCELLANEOUS) ×9 IMPLANT
DERMABOND ADVANCED (GAUZE/BANDAGES/DRESSINGS) ×1
DERMABOND ADVANCED .7 DNX12 (GAUZE/BANDAGES/DRESSINGS) ×9 IMPLANT
DRAIN CHANNEL JP 19F (MISCELLANEOUS) IMPLANT
DRAPE 3/4 80X56 (DRAPES) ×7 IMPLANT
DRAPE ARM DVNC X/XI (DISPOSABLE) ×24 IMPLANT
DRAPE COLUMN DVNC XI (DISPOSABLE) ×6 IMPLANT
DRAPE DA VINCI XI ARM (DISPOSABLE) ×4
DRAPE DA VINCI XI COLUMN (DISPOSABLE) ×2
DRAPE INCISE IOBAN 66X45 STRL (DRAPES) ×4 IMPLANT
DRAPE INCISE IOBAN 66X60 STRL (DRAPES) ×1 IMPLANT
DRAPE LAPAROTOMY 100X77 ABD (DRAPES) ×4 IMPLANT
DRAPE LAPAROTOMY TRNSV 106X77 (MISCELLANEOUS) ×4 IMPLANT
DRAPE LEGGINS SURG 28X43 STRL (DRAPES) ×1 IMPLANT
DRAPE STERI POUCH LG 24X46 STR (DRAPES) ×4 IMPLANT
DRAPE SURG 17X11 SM STRL (DRAPES) ×14 IMPLANT
DRAPE UNDER BUTTOCK W/FLU (DRAPES) ×4 IMPLANT
DRESSING SURGICEL FIBRLLR 1X2 (HEMOSTASIS) IMPLANT
DRSG SURGICEL FIBRILLAR 1X2 (HEMOSTASIS) ×4
DRSG TEGADERM 2-3/8X2-3/4 SM (GAUZE/BANDAGES/DRESSINGS) IMPLANT
DRSG TEGADERM 4X4.75 (GAUZE/BANDAGES/DRESSINGS) ×1 IMPLANT
DRSG TELFA 3X8 NADH (GAUZE/BANDAGES/DRESSINGS) ×4 IMPLANT
ELECT BLADE 6 FLAT ULTRCLN (ELECTRODE) ×4 IMPLANT
ELECT CAUTERY BLADE 6.4 (BLADE) ×4 IMPLANT
ELECT REM PT RETURN 9FT ADLT (ELECTROSURGICAL) ×8
ELECTRODE REM PT RTRN 9FT ADLT (ELECTROSURGICAL) ×6 IMPLANT
GAUZE 4X4 16PLY ~~LOC~~+RFID DBL (SPONGE) ×11 IMPLANT
GAUZE SPONGE 4X4 12PLY STRL (GAUZE/BANDAGES/DRESSINGS) ×4 IMPLANT
GLOVE SURG ENC MOIS LTX SZ6.5 (GLOVE) ×28 IMPLANT
GLOVE SURG ENC TEXT LTX SZ6.5 (GLOVE) ×5 IMPLANT
GLOVE SURG ENC TEXT LTX SZ7.5 (GLOVE) ×4 IMPLANT
GLOVE SURG UNDER LTX SZ6.5 (GLOVE) ×4 IMPLANT
GLOVE SURG UNDER LTX SZ7 (GLOVE) ×8 IMPLANT
GLOVE SURG UNDER POLY LF SZ6.5 (GLOVE) ×17 IMPLANT
GLOVE SURG UNDER POLY LF SZ7.5 (GLOVE) ×2 IMPLANT
GOWN STRL REUS W/ TWL LRG LVL3 (GOWN DISPOSABLE) ×36 IMPLANT
GOWN STRL REUS W/ TWL XL LVL3 (GOWN DISPOSABLE) ×3 IMPLANT
GOWN STRL REUS W/TWL LRG LVL3 (GOWN DISPOSABLE) ×12
GOWN STRL REUS W/TWL XL LVL3 (GOWN DISPOSABLE) ×1
GRASPER SUT TROCAR 14GX15 (MISCELLANEOUS) ×4 IMPLANT
HANDLE YANKAUER SUCT BULB TIP (MISCELLANEOUS) ×4 IMPLANT
HEMOSTAT SURGICEL 2X14 (HEMOSTASIS) ×5 IMPLANT
HEMOSTAT SURGICEL 4X8 (HEMOSTASIS) IMPLANT
HOLDER FOLEY CATH W/STRAP (MISCELLANEOUS) ×4 IMPLANT
IRRIGATION STRYKERFLOW (MISCELLANEOUS) ×3 IMPLANT
IRRIGATOR STRYKERFLOW (MISCELLANEOUS) ×4
IV NS 1000ML (IV SOLUTION) ×1
IV NS 1000ML BAXH (IV SOLUTION) ×3 IMPLANT
KIT IMAGING PINPOINTPAQ (MISCELLANEOUS) IMPLANT
KIT PINK PAD W/HEAD ARE REST (MISCELLANEOUS) ×8
KIT PINK PAD W/HEAD ARM REST (MISCELLANEOUS) ×6 IMPLANT
KIT TURNOVER CYSTO (KITS) ×8 IMPLANT
KIT TURNOVER KIT A (KITS) ×4 IMPLANT
KITTNER LAPARASCOPIC 5X40 (MISCELLANEOUS) ×4 IMPLANT
L-HOOK LAP DISP 36CM (ELECTROSURGICAL) ×8
LABEL OR SOLS (LABEL) ×7 IMPLANT
LHOOK LAP DISP 36CM (ELECTROSURGICAL) ×3 IMPLANT
LIGASURE LAP ATLAS 10MM 37CM (INSTRUMENTS) ×5 IMPLANT
LOOP RED MAXI  1X406MM (MISCELLANEOUS) ×1
LOOP VESSEL MAXI 1X406 RED (MISCELLANEOUS) ×3 IMPLANT
LOOP VESSEL MAXI BLUE (MISCELLANEOUS) ×1 IMPLANT
LOOP VESSEL MINI 0.8X406 BLUE (MISCELLANEOUS) ×3 IMPLANT
LOOPS BLUE MINI 0.8X406MM (MISCELLANEOUS) ×1
MANIFOLD NEPTUNE II (INSTRUMENTS) ×8 IMPLANT
MANIPULATOR VCARE LG CRV RETR (MISCELLANEOUS) IMPLANT
MANIPULATOR VCARE SML CRV RETR (MISCELLANEOUS) ×1 IMPLANT
MANIPULATOR VCARE STD CRV RETR (MISCELLANEOUS) IMPLANT
NDL HYPO 21X1.5 SAFETY (NEEDLE) ×3 IMPLANT
NDL INSUFF ACCESS 14 VERSASTEP (NEEDLE) ×4 IMPLANT
NDL INSUFFLATION 14GA 120MM (NEEDLE) ×3 IMPLANT
NDL SPNL 22GX3.5 QUINCKE BK (NEEDLE) IMPLANT
NEEDLE HYPO 21X1.5 SAFETY (NEEDLE) ×4 IMPLANT
NEEDLE HYPO 22GX1.5 SAFETY (NEEDLE) ×7 IMPLANT
NEEDLE INSUFFLATION 14GA 120MM (NEEDLE) ×4 IMPLANT
NEEDLE SPNL 22GX3.5 QUINCKE BK (NEEDLE) ×4 IMPLANT
NEEDLE VERESS 14GA 120MM (NEEDLE) ×4 IMPLANT
NS IRRIG 1000ML POUR BTL (IV SOLUTION) ×12 IMPLANT
NS IRRIG 500ML POUR BTL (IV SOLUTION) ×4 IMPLANT
OBTURATOR OPTICAL STANDARD 8MM (TROCAR) ×1
OBTURATOR OPTICAL STND 8 DVNC (TROCAR) ×3
OBTURATOR OPTICALSTD 8 DVNC (TROCAR) ×3 IMPLANT
OCCLUDER COLPOPNEUMO (BALLOONS) ×4 IMPLANT
PACK BASIN MAJOR ARMC (MISCELLANEOUS) ×7 IMPLANT
PACK GYN LAPAROSCOPIC (MISCELLANEOUS) ×4 IMPLANT
PACK LAP CHOLECYSTECTOMY (MISCELLANEOUS) ×6 IMPLANT
PAD CLEANER CAUTERY TIP 5X5 (MISCELLANEOUS) ×1
PAD DRESSING TELFA 3X8 NADH (GAUZE/BANDAGES/DRESSINGS) ×3 IMPLANT
PAD OB MATERNITY 4.3X12.25 (PERSONAL CARE ITEMS) ×4 IMPLANT
PAD PREP 24X41 OB/GYN DISP (PERSONAL CARE ITEMS) ×4 IMPLANT
PENCIL ELECTRO HAND CTR (MISCELLANEOUS) ×8 IMPLANT
PENCIL SMOKE EVACUATOR (MISCELLANEOUS) ×1 IMPLANT
RELOAD STAPLE 35X2.5 WHT THIN (STAPLE) IMPLANT
RELOAD STAPLE 45 2.6 WHT THIN (STAPLE) IMPLANT
RELOAD STAPLE 60 2.6 WHT THN (STAPLE) IMPLANT
RELOAD STAPLER WHITE 60MM (STAPLE) ×6 IMPLANT
SCISSORS METZENBAUM CVD 33 (INSTRUMENTS) ×4 IMPLANT
SEAL CANN UNIV 5-8 DVNC XI (MISCELLANEOUS) ×21 IMPLANT
SEAL XI 5MM-8MM UNIVERSAL (MISCELLANEOUS) ×4
SEALER VESSEL DA VINCI XI (MISCELLANEOUS) ×1
SEALER VESSEL EXT DVNC XI (MISCELLANEOUS) IMPLANT
SET CYSTO W/LG BORE CLAMP LF (SET/KITS/TRAYS/PACK) IMPLANT
SET TUBE SMOKE EVAC HIGH FLOW (TUBING) ×8 IMPLANT
SLEEVE ENDOPATH XCEL 5M (ENDOMECHANICALS) IMPLANT
SLEEVE VERSASTEP EXPAND ONEST (MISCELLANEOUS) IMPLANT
SOLUTION ELECTROLUBE (MISCELLANEOUS) ×7 IMPLANT
SPONGE T-LAP 18X18 ~~LOC~~+RFID (SPONGE) ×11 IMPLANT
SPONGE T-LAP 4X18 ~~LOC~~+RFID (SPONGE) ×4 IMPLANT
SPONGE VERSALON 4X4 4PLY (MISCELLANEOUS) IMPLANT
STAPLE ECHEON FLEX 60 POW ENDO (STAPLE) ×1 IMPLANT
STAPLE RELOAD 2.5MM WHITE (STAPLE) IMPLANT
STAPLE RELOAD 45 WHT (STAPLE) IMPLANT
STAPLE RELOAD 45MM WHITE (STAPLE)
STAPLER CANNULA SEAL DVNC XI (STAPLE) IMPLANT
STAPLER CANNULA SEAL XI (STAPLE) ×1
STAPLER INSORB 30 2030 C-SECTI (MISCELLANEOUS) IMPLANT
STAPLER RELOAD WHITE 60MM (STAPLE) ×8
STAPLER SKIN PROX 35W (STAPLE) ×4 IMPLANT
STAPLER VASCULAR ECHELON 35 (CUTTER) IMPLANT
STRAP SAFETY 5IN WIDE (MISCELLANEOUS) ×8 IMPLANT
STRIP CLOSURE SKIN 1/2X4 (GAUZE/BANDAGES/DRESSINGS) IMPLANT
SURGIFLO W/THROMBIN 8M KIT (HEMOSTASIS) ×4 IMPLANT
SURGILUBE 2OZ TUBE FLIPTOP (MISCELLANEOUS) ×1 IMPLANT
SUT CHROMIC 0 CT 1 (SUTURE) IMPLANT
SUT DVC VLOC 180 0 12IN GS21 (SUTURE) ×8
SUT DVC VLOC 90 3-0 CV23 UNDY (SUTURE) ×2 IMPLANT
SUT ETHILON 3 0 PS 1 (SUTURE) IMPLANT
SUT ETHILON 3-0 FS-10 30 BLK (SUTURE)
SUT MNCRL 3-0 UNDYED SH (SUTURE) IMPLANT
SUT MNCRL 4-0 (SUTURE) ×1
SUT MNCRL 4-0 27XMFL (SUTURE) ×3
SUT MNCRL AB 4-0 PS2 18 (SUTURE) ×7 IMPLANT
SUT MONOCRYL 3-0 UNDYED (SUTURE)
SUT PDS AB 1 CT1 36 (SUTURE) ×13 IMPLANT
SUT PDS AB 1 TP1 96 (SUTURE) ×12 IMPLANT
SUT PROLENE 5 0 RB 1 DA (SUTURE) ×4 IMPLANT
SUT SILK 0 (SUTURE) ×1
SUT SILK 0 30XBRD TIE 6 (SUTURE) ×3 IMPLANT
SUT SILK 2 0 (SUTURE) ×1
SUT SILK 2-0 30XBRD TIE 12 (SUTURE) ×3 IMPLANT
SUT VIC AB 0 CT1 27 (SUTURE) ×1
SUT VIC AB 0 CT1 27XCR 8 STRN (SUTURE) ×6 IMPLANT
SUT VIC AB 0 CT1 36 (SUTURE) ×12 IMPLANT
SUT VIC AB 1 CT1 36 (SUTURE) IMPLANT
SUT VIC AB 2-0 SH 27 (SUTURE) ×2
SUT VIC AB 2-0 SH 27XBRD (SUTURE) ×12 IMPLANT
SUT VIC AB 2-0 UR6 27 (SUTURE) ×1 IMPLANT
SUT VIC AB 3-0 SH 27 (SUTURE) ×2
SUT VIC AB 3-0 SH 27X BRD (SUTURE) ×6 IMPLANT
SUT VIC AB 4-0 FS2 27 (SUTURE) ×4 IMPLANT
SUT VIC AB 4-0 RB1 27 (SUTURE)
SUT VIC AB 4-0 RB1 27X BRD (SUTURE) IMPLANT
SUT VICRYL 0 AB UR-6 (SUTURE) ×9 IMPLANT
SUT VICRYL 0 UR6 27IN ABS (SUTURE) ×4 IMPLANT
SUT VICRYL AB 3-0 FS1 BRD 27IN (SUTURE) ×4 IMPLANT
SUTURE DVC VLC 180 0 12IN GS21 (SUTURE) IMPLANT
SUTURE EHLN 3-0 FS-10 30 BLK (SUTURE) IMPLANT
SUTURE MNCRL 4-0 27XMF (SUTURE) ×3 IMPLANT
SYR 10ML LL (SYRINGE) ×4 IMPLANT
SYR 3ML LL SCALE MARK (SYRINGE) ×1 IMPLANT
SYR 50ML LL SCALE MARK (SYRINGE) ×4 IMPLANT
SYR BULB IRRIG 60ML STRL (SYRINGE) ×4 IMPLANT
SYR TOOMEY 50ML (SYRINGE) IMPLANT
SYS LAPSCP GELPORT 120MM (MISCELLANEOUS) ×4
SYSTEM LAPSCP GELPORT 120MM (MISCELLANEOUS) ×3 IMPLANT
TAPE CLOTH 3X10 WHT NS LF (GAUZE/BANDAGES/DRESSINGS) ×8 IMPLANT
TAPE UMBILICAL 1/8X18 (MISCELLANEOUS) IMPLANT
TOWEL OR 17X26 4PK STRL BLUE (TOWEL DISPOSABLE) ×4 IMPLANT
TRAP SPECIMEN MUCUS 40CC (MISCELLANEOUS) IMPLANT
TRAY FOLEY MTR SLVR 16FR STAT (SET/KITS/TRAYS/PACK) ×7 IMPLANT
TRAY FOLEY SLVR 16FR LF STAT (SET/KITS/TRAYS/PACK) ×3 IMPLANT
TROCAR 12M 150ML BLUNT (TROCAR) IMPLANT
TROCAR ENDOPATH XCEL 12X100 BL (ENDOMECHANICALS) ×4 IMPLANT
TROCAR VERSASTEP PLUS 12MM (TROCAR) ×4 IMPLANT
TROCAR XCEL 12X100 BLDLESS (ENDOMECHANICALS) ×4 IMPLANT
TROCAR XCEL NON-BLD 11X100MML (ENDOMECHANICALS) IMPLANT
TROCAR XCEL NON-BLD 5MMX100MML (ENDOMECHANICALS) ×4 IMPLANT
WATER STERILE IRR 500ML POUR (IV SOLUTION) ×8 IMPLANT

## 2021-02-17 NOTE — Transfer of Care (Signed)
Immediate Anesthesia Transfer of Care Note  Patient: Mariah Lang  Procedure(s) Performed: LAPAROTOMY OR HAND ASSIST XI ROBOTIC ASSISTED LAPAROSCOPIC HYSTERECTOMY AND SALPINGECTOMY (Bilateral) HAND ASSISTED LAPAROSCOPIC RADICAL  NEPHRECTOMY (Left)  Patient Location: PACU  Anesthesia Type:General  Level of Consciousness: drowsy and patient cooperative  Airway & Oxygen Therapy: Patient Spontanous Breathing and Patient connected to face mask oxygen  Post-op Assessment: Report given to RN and Post -op Vital signs reviewed and stable  Post vital signs: Reviewed and stable  Last Vitals:  Vitals Value Taken Time  BP 128/76 02/17/21 1605  Temp 36.2 C 02/17/21 1600  Pulse 71 02/17/21 1606  Resp 24 02/17/21 1606  SpO2 99 % 02/17/21 1606  Vitals shown include unvalidated device data.  Last Pain:  Vitals:   02/17/21 1600  TempSrc:   PainSc: Asleep         Complications: No notable events documented.

## 2021-02-17 NOTE — Op Note (Signed)
Operative Note   Date 02/17/2021 TIME 1:56 PM  PRE-OP DIAGNOSIS: Leiomyoma and left adnexal cyst  POST-OP DIAGNOSIS: Multiple uterine leiomyoma, largest measuring 14.5 cm, uterus >250 gm; bilateral hydrosalginges; and pelvic adhesive disease.   SURGEON: Surgeon(s) and Role: Panel 1:  Juanya Villavicencio Gaetana Michaelis, MD  ASSISTANT:  Prentice Docker. MD  ANESTHESIA: General  PROCEDURE: Procedure(s): Exam under anesthesia, minilaparotomy, myomectomy, robotic assisted total hysterectomy and bilateral salpingo-oophorectomy, and lysis of adhesions >45 minutes.  ESTIMATED BLOOD LOSS: 200 cc  DRAINS: Foley  TOTAL IV FLUIDS: Please see Dr. Cherrie Gauze operative report  UOP: Please see Dr. Cherrie Gauze operative report  SPECIMENS:  Leiomyoma from myomectomy, uterine wall from myomectomy, uterus, cervix, and bilateral fallopian tubes and ovaries.   COMPLICATIONS: None  DISPOSITION: PACU  CONDITION: Stable  INDICATIONS: Renal mass, leiomyoma and left adnexal cyst.   FINDINGS: Exam under anesthesia revealed an enlarged 20-week mobile anteverted uterus including the left fibroid and right lateral fibroid. Overall mobile. The parametria was smooth. The cervix was negative for gross lesions. Intraoperative findings included: The uterus was very enlarged 14 week size with multiple fibroids (right broad ligament, and pedunculated left fibroid). The tubes were consistent with hydrosalpinges bilaterally. Ovaries appeared normal. Adhesions involving the adnexa to the uterus and bladder to the anterior uterine wall were visualized. The upper abdomen was normal including omentum, bowel, liver, stomach, and diaphragmatic surfaces. There was no evidence of grossly enlarged pelvic lymph nodes.   PROCEDURE IN DETAIL: After informed consent was obtained, the patient was taken to the operating room where anesthesia was obtained without difficulty. The patient was positioned in the dorsal lithotomy position in [place] and her arms were carefully tucked at her sides and the usual precautions were taken.  She was prepped and draped in normal sterile fashion.  Time-out was performed. The Foley was placed.   An eight cm incisions was made in the midline and carried down through the various layers until the peritoneal cavity was entered. The incision was extended and peritoneal cavity was explored. The left fibroid measuring ~15 cm was extending off the fundus with a thick 6 x 2 cm pedicle. The decision was made to extend the incision, externalize the fibroid, and perform a myomectomy. A red rubber was used as a tourniquet and secured tightly with a Kelly clamp. Vasopressin (20 Units in 100 cc normal saline) was injected into the fundal leiomyoma for a total of ~ 30 cc. The serosa was incised the the fibroid shelled out. The ligasure was used to seal and transect vascular pedicles and along the base of the fibroid. The fibroid was removed from the abdomen and intraoperative evaluation was consistent with benign leiomyoma. The excessive serosa was resected. The uterine wall was reapproximated using 0-Vicryl. The gel port was placed.   Attention was turned to the robotic portion of the procedure and placement of the uterine manipulator.  A speculum was placed in the vagina and the cervical os was dilated. The uterus sounded to 10 cm.  A small V-Care uterine manipulator was then placed in the uterus without incident.    The robotic 12 mm port was placed in the upper abdomen and the abdomen insuffulated. Three additional ports were placed in the bilateral lower quadrants under direct visualization. The patient was placed in Trendelenburg and the bowel was displaced up into the upper abdomen.  The robotic port docking was performed. Round ligaments were divided on each side and the retroperitoneal space was opened bilaterally. The infundibulopelvic  ligaments were skeletonized, and the ureters were identified and preserved.  The bilateral infundibular ligaments were sealed and divided.  The posterior leaves of the broad ligament were excised.   Adesiolysis was performed involved the pelvic organs and bladder for at elast 45 minutes. A bladder flap was created and the bladder was dissected down off the lower uterine segment and cervix. The uterus was manipulated to allow exposure of the uterine arteries. The uterine arteries were skeletonized bilaterally, sealed and divided with cautery and transected.  A colpotomy was performed circumferentially along the V-Care ring with electrocautery and the cervix was incised from the vagina. The V-care and specimen was removed.  A pneumo balloon was placed in the vagina. The large endocatch bag was placed in the vagina and specimen placed in the bag. This was removed via the minilaparotomy. The vaginal cuff was then closed in a running continuous fashion using 0 V-Lock suture with careful attention to include the vaginal cuff angles and the vaginal mucosa within the closure.    Hemostasis was observed. The intraperitoneal pressure was dropped, and all planes of dissection, vascular pedicles and the vaginal cuff were found to be hemostatic.  The patient tolerated the procedure well.  The procedure was turned over to Dr. Erlene Quan for her portion of the surgery.   Antibiotics: Given 1st generation cephalosporin with Flagyl, Antibiotics given within 1 hour of the start of the procedure, Antibiotics ordered to be discontinued within 24 hours post procedure  VTE prophylaxis: was ordered perioperatively.   Dr. Prentice Docker assisted me with the procedure which could not have been performed without his assistance. This was a high-level case large uterus adhesive disease requiring a Insurance risk surveyor. Dr. Glennon Mac performed the robotic port placement, docking, and insertion of robotic instruments while I performed the vaginal part of the procedure. He also provided high level assistance  throughout the procedure as a bed-side assistant and uterine manipulator that was needed for the procedure. He closed the vaginal cuff and ensured hemostasis.    Gillis Ends, MD

## 2021-02-17 NOTE — H&P (Signed)
02/17/21 RRR CTAB  No changes in medical history   Mariah Lang 1970-10-17 [id number]   Referring provider:  Charlott Rakes, MD [address] [place],  [place] [address]    Chief Complaint  Patient presents with   renal mass        HPI: Mariah Lang is a 50 y.o.female with a personal history of left renal mass, who presents today for follow-up.    CT abdomen and pelvis contrast on 11/15/2020 revealed a 10.7 cm left upper pole rena mass, compatible with solid renal neoplasm such as renal cell carcinoma. No findings were suspicious for metastatic disease. Additionally, uterine fibroids, including a dominant 14.5 cm subserosal fibroid in the left uterine fundus was identified.   12/18/2020 MRI of pelvis revealed markedly enlarged uterus with numerous fibroids. Dominant 12.9 cm pedunculated fibroid extends from the left uterine fundus into the left abdomen, but has a thick soft tissue pedicle of attachment. No intracavitary fibroids identified. 10.8 cm complex cystic lesion in the left adnexa, which is indeterminate. Its elongated/tubular configuration suggests the possibility of a cystic ovarian lesion combined with an adjacent hydrosalpinx,; malignancy cannot be excluded.    12/18/2020 MRI of abdomen revealed 10.3 cm solid enhancing mass arising from the anterior mid pole of the left kidney, high suspicious for renal cell carcinoma, no evidence of abdominal metastatic disease, and large uterine fibroid enhancing the left abdomen.    CT of chest showed no evidence of metastatic disease or other significant abnormality within th thorax.    She is accompanied today by a spanish interpreter.    She reports about a year ago she was experiencing clots in her urine, she was told that it was menstruation.     PMH:     Past Medical History:  Diagnosis Date   Anemia        Surgical History:      Past Surgical History:  Procedure Laterality  Date   APPENDECTOMY       CESAREAN SECTION   2000   OTHER SURGICAL HISTORY   1993    c-section      Home Medications:  Allergies as of 12/23/2020   No Known Allergies         Medication List           Accurate as of December 23, 2020 12:11 PM. If you have any questions, ask your nurse or doctor.              STOP taking these medications     sulfamethoxazole-trimethoprim 800-160 MG tablet Commonly known as: BACTRIM DS Stopped by: Hollice Espy, MD           TAKE these medications     Calcium 1200 1200-1000 MG-UNIT Chew Chew 1 tablet by mouth daily.    cholecalciferol 25 MCG (1000 UNIT) tablet Commonly known as: VITAMIN D3 Take 1 tablet (1,000 Units total) by mouth daily.    meloxicam 7.5 MG tablet Commonly known as: MOBIC Tome 1 tableta (7.5 mg en total) por va oral diariamente. (Take 1 tablet (7.5 mg total) by mouth daily.)    Vitamin D (Ergocalciferol) 1.25 MG (50000 UNIT) Caps capsule Commonly known as: DRISDOL Take 1 capsule (50,000 Units total) by mouth every 7 (seven) days.             Allergies: No Known Allergies   Family History:      Family History  Problem Relation Age of Onset   Diabetes Mother  Hypertension Mother     Diabetes Paternal Grandmother        Social History:  reports that she has never smoked. She has never used smokeless tobacco. She reports that she does not drink alcohol and does not use drugs.     Physical Exam: BP 132/78   Pulse 82   Ht 5' (1.524 m)   Wt 185 lb (83.9 kg)   BMI 36.13 kg/m   Constitutional:  Alert and oriented, No acute distress. HEENT: [place] AT, moist mucus membranes.  Trachea midline, no masses. Cardiovascular: No clubbing, cyanosis, or edema. Respiratory: Normal respiratory effort, no increased work of breathing. Abdomen: Obese.  Right lower quadrant vertical incision from previous appendectomy along with C-section scar noted. Skin: No rashes, bruises or suspicious lesions. Neurologic:  Grossly intact, no focal deficits, moving all 4 extremities. Psychiatric: Normal mood and affect.   Laboratory Data:   Recent Labs       Lab Results  Component Value Date    CREATININE 0.79 11/15/2020        Recent Labs       Lab Results  Component Value Date    HGBA1C 5.7 (A) 09/14/2020        Pertinent Imaging: CLINICAL DATA:  Symptomatic uterine fibroids.   EXAM: MRI PELVIS WITHOUT AND WITH CONTRAST   TECHNIQUE: Multiplanar multisequence MR imaging of the pelvis was performed both before and after administration of intravenous contrast.   CONTRAST:  7.77mL GADAVIST GADOBUTROL 1 MMOL/ML IV SOLN   COMPARISON:  CT on 11/16/2020   FINDINGS: Lower Urinary Tract: No bladder or urethral abnormality identified.   Bowel:  Unremarkable visualized pelvic bowel loops.   Vascular/Lymphatic: No pathologically enlarged lymph nodes or other significant abnormality.   Reproductive:   -- Uterus: Measures 24.1 by 11.1 by 9.6 cm (volume = 1300 cm^3). Diffuse uterine involvement by numerous fibroids are seen. The majority of these measure 1-2 cm in size. Two dominant fibroids are seen which are subserosal in location in the right lateral corpus measuring 9.1 cm in maximum diameter, and extending exophytically from the left uterine fundus into the left abdomen measuring 12.9 cm in maximum diameter. This fibroid is pedunculated, but has a thick pedicle of attachment to the uterus measuring approximately 8.5 cm in thickness. No abnormal endometrial thickening is seen. Cervix and vagina are normal appearance.   -- Intracavitary fibroids:  None.   -- Pedunculated fibroids: 12.9 cm pedunculated fibroid extending from the left uterine fundus into the left abdomen, which has a thick soft tissue pedicle of attachment measuring approximately 8.5 cm in thickness.   -- Fibroid contrast enhancement: All fibroids show contrast enhancement, without significant  degeneration/devascularization.   -- Right ovary:  Appears normal.  No mass identified.   -- Left ovary: A complex cystic lesion is seen in the left adnexa which has an elongated appearance and measures 10.8 x 5.5 cm. Numerous internal septations are seen within this lesion which has a somewhat tubular configuration, and this raises suspicion for combination of a cystic ovarian lesion and adjacent hydrosalpinx.   Other: Tiny amount of free pelvic fluid noted.   Musculoskeletal:  Unremarkable.   IMPRESSION: Markedly enlarged uterus with numerous fibroids. Dominant 12.9 cm pedunculated fibroid extends from the left uterine fundus into the left abdomen, but has a thick soft tissue pedicle of attachment. No intracavitary fibroids identified.   10.8 cm complex cystic lesion in the left adnexa, which is indeterminate. Its elongated/tubular configuration suggests the possibility of a  cystic ovarian lesion combined with an adjacent hydrosalpinx,; malignancy cannot be excluded. Recommend correlation with tumor markers, and consider surgical evaluation versus follow-up by MRI in 6 weeks.     Electronically Signed   By: Marlaine Hind M.D.   On: 12/19/2020 09:20   CLINICAL DATA:  Left renal mass.   EXAM: MRI ABDOMEN WITHOUT AND WITH CONTRAST   TECHNIQUE: Multiplanar multisequence MR imaging of the abdomen was performed both before and after the administration of intravenous contrast.   CONTRAST:  7.70mL GADAVIST GADOBUTROL 1 MMOL/ML IV SOLN   COMPARISON:  CT on 11/16/2020   FINDINGS: Lower chest: No acute findings.   Hepatobiliary: No hepatic masses identified. 2 cm gallstone is seen, however there is no evidence of cholecystitis or biliary ductal dilatation.   Pancreas: No mass or inflammatory changes. No evidence of pancreatic ductal dilatation.   Spleen:  Within normal limits in size and appearance.   Adrenals/Urinary Tract: The adrenal glands and right kidney  are normal appearance. A large solid enhancing soft tissue mass is seen arising from the anterior midpole the left kidney which measures 10.3 x 9.6 cm. This shows no evidence of internal fat, and is highly suspicious for renal cell carcinoma. Retroaortic left renal vein is noted, however there is no evidence of tumor thrombus. This mass abuts, but does not extend through Gerota fascia.   Stomach/Bowel: Visualized portion unremarkable.   Vascular/Lymphatic: No pathologically enlarged lymph nodes identified. No acute vascular findings.   Other: A large uterine fibroid is seen extending into the left abdomen.   Musculoskeletal:  No suspicious bone lesions identified.   IMPRESSION: 10.3 cm solid enhancing mass arising from the anterior midpole of the left kidney, highly suspicious for renal cell carcinoma.   No evidence of abdominal metastatic disease.   Cholelithiasis. No radiographic evidence of cholecystitis or biliary ductal dilatation.   Large uterine fibroid extending into the left abdomen. See separate report for pelvis MRI without and with contrast.     Electronically Signed   By: Marlaine Hind M.D.   On: 12/19/2020 09:31   CLINICAL DATA:  Newly diagnosed left renal neoplasm. Staging.   EXAM: CT CHEST WITHOUT CONTRAST   TECHNIQUE: Multidetector CT imaging of the chest was performed following the standard protocol without IV contrast.   COMPARISON:  None.   FINDINGS: Cardiovascular: No acute findings. Mild aortic atherosclerotic calcification noted.   Mediastinum/Nodes: No masses or pathologically enlarged lymph nodes identified on this unenhanced exam.   Lungs/Pleura: No suspicious nodules or masses identified. No evidence of infiltrate or pleural effusion.   Upper Abdomen:  Unremarkable.   Musculoskeletal:  No suspicious bone lesions.   IMPRESSION: No evidence of metastatic disease or other significant abnormality within the thorax.   Aortic  Atherosclerosis (ICD10-I70.0).     Electronically Signed   By: Marlaine Hind M.D.   On: 12/19/2020 10:18     I have personally reviewed the images and agree with radiologist interpretation.      Assessment & Plan:     Left renal mass  - reviewed MRI which is highly concerning for renal cell carcinoma she would benefit from a radical nephrectomy ; no other evidence of metastatic disease on chest CT over the abdomen pelvis   - Discussed case with Dr Theora Gianotti   -Due to pedunculated into the left abdomen she is interesting in trying minimally invasive course. Nephrectomy is to follow hysterectomy versus fibroid removal    - Risk of nephrectomy consist of bleeding,  infection, hernia, bowel injury, and scar tissue formation, ileus amongst others.  We also discussed the risk of progression to end-stage renal disease as well as solitary kidney precautions.   -We will attempt to do this robotically if Dr. Theora Gianotti uses a robotic approach, otherwise could also consider hand assist laparoscopic.  There is a low threshold to convert to open via large midline incision if necessary.  Patient understands all this.  We will likely consent her for all of these in anticipation.  -We discussed the postoperative course      I,Kailey Littlejohn,acting as a scribe for Hollice Espy, MD.,have documented all relevant documentation on the behalf of Hollice Espy, MD,as directed by  Hollice Espy, MD while in the presence of Hollice Espy, MD.   Hollice Espy, MD     [facility] [address] [place], [place] [address] [phone]

## 2021-02-17 NOTE — Anesthesia Preprocedure Evaluation (Signed)
Anesthesia Evaluation  Patient identified by MRN, date of birth, ID band Patient awake    Reviewed: Allergy & Precautions, NPO status , Patient's Chart, lab work & pertinent test results  Airway Mallampati: III  TM Distance: >3 FB Neck ROM: Full    Dental no notable dental hx.    Pulmonary neg pulmonary ROS,    Pulmonary exam normal        Cardiovascular negative cardio ROS Normal cardiovascular exam     Neuro/Psych negative neurological ROS  negative psych ROS   GI/Hepatic negative GI ROS, Neg liver ROS,   Endo/Other  negative endocrine ROS  Renal/GU negative Renal ROS  negative genitourinary   Musculoskeletal negative musculoskeletal ROS (+)   Abdominal   Peds negative pediatric ROS (+)  Hematology negative hematology ROS (+) anemia ,   Anesthesia Other Findings   Reproductive/Obstetrics negative OB ROS                            Anesthesia Physical Anesthesia Plan  ASA: 2  Anesthesia Plan: General   Post-op Pain Management:    Induction: Intravenous  PONV Risk Score and Plan: 3 and Propofol infusion, Ondansetron and Midazolam  Airway Management Planned: Oral ETT  Additional Equipment:   Intra-op Plan:   Post-operative Plan: Extubation in OR  Informed Consent: I have reviewed the patients History and Physical, chart, labs and discussed the procedure including the risks, benefits and alternatives for the proposed anesthesia with the patient or authorized representative who has indicated his/her understanding and acceptance.       Plan Discussed with: CRNA, Anesthesiologist and Surgeon  Anesthesia Plan Comments:         Anesthesia Quick Evaluation

## 2021-02-17 NOTE — OR Nursing (Signed)
Patient repositioned, re prepped and re draped for nephrectomy.

## 2021-02-17 NOTE — H&P (Signed)
[facility] Jarome Lamas has no significant changes since she was last seen. We discussed oophorectomy given FSH. She would like to proceed with oophorectomy. Consent is signed. We will proceed with surgery as planned.  Shalise Rosado Gaetana Michaelis, MD

## 2021-02-17 NOTE — Progress Notes (Signed)
Received patient from PACU via bed.  Patient is drowsy, easily arousable.  Following commands, A&O x 4.  Abdominal lap sites and midline incision with skin glue, OTA, dry, clean, and intact.  C/o pain 9/10, sharp, constant.  Ice pack reapplied on abdomen.  Bilateral SCDs in ongoing.  Patient's daughter and son at bedside.  Patient and family oriented to room and unit routine, patient assisted in position of comfort, call bell within reach.  Pain medication given as ordered.  Needs addressed.  Dr. Erlene Quan saw patient, abdomen checked, no new orders.  Will continue to monitor.

## 2021-02-17 NOTE — Op Note (Addendum)
02/17/21  PREOP DIAGNOSIS: Left renal mass  POSTOPERATIVE DIAGNOSIS: Left renal mass  OPERATION PERFORMED: Hand-assisted laparoscopic left radical nephrectomy.  SURGEON: Hollice Espy, MD  Assistant: Nickolas Madrid, MD  ANESTHESIA: General.  ESTIMATED BLOOD LOSS: 50  cc (nephrectomy portion of the procedure)  DRAINS: 16-French Foley catheter.  COMPLICATIONS: None.  Indications: 10 cm renal mass  FINDINGS: Large central left renal mass.  Hilum stapled en bloc.  No complications.   DESCRIPTION OF OPERATION: Informed consent was obtained. The patient was marked on the left side. IV antibiotics were given for bacterial prophylaxis on call to the operating room. SCDs were provided for DVT prophylaxis. The patient was taken to the operating room and placed supine on the operating table. General anesthesia was provided.  I was present for the GYN portion of the procedure at which time port sites were planned.  A midline periumbilical HandPort was placed along with a 12 mm mid upper abdominal trocar which was preclosed using a Carter-Thomason along with 3 additional robotic 8 mm ports placed by Dr. Theora Gianotti and Glennon Mac.  After their procedure was completed, I placed a large Ioban over these operative ports to maintain sterility.  Notably, Foley catheter had also been placed during their portion of the procedure was maintained  The patient was positioned in right lateral decubitus with the left flank elevated about 70 degrees and the table flexed slightly. The left arm was placed in a padded airplane for support.  No axillary roll was used as the patient was not placed on her axilla.  This was a sloppy lateral position.  The patient was secured to the table with soft straps and then prepped and draped sterilely.  We had a time-out confirming the patient identification, planned procedure, surgical site, and all present were in agreement. All present were in agreement.  The  white line of [place] was incised. The colon was reflected from the spleen to the pelvis. Gerota's fascia was lifted up off the lower pole of the kidney and the ureter and gonadal vessels were identified. The gonadal vein was exposed and followed up to the main renal vein. The branch point of the gonadal vein and adrenal vein were exposed at the renal vein. The upper pole of the kidney was mobilized off of the quadratus lumborum and further mobilized away from the spleen. The adrenal gland was identified and spared during the upper pole dissection. The lower pole and lateral attachments were freed. The kidney was held laterally and the hilar dissection was completed.    The ureter was exposed, clipped distally using Weck clips and divided. The ureter stump was confirmed hemostatic.  At this point, the renal artery and vein were taken together en bloc after they have been skeletonized staple using the 60 mm vascular load battery operated endovascular stapler.  An endocatch bag was then used to bag the specimen.  The kidney was extracted through the gel port and passed off for pathological analysis.   Pneumoperitoneal pressure was reduced to 7 mmHg and the abdomen was inspected; hemostasis was confirmed. The 12 mm trocar was  removed and the port sites closed with previously placed 0 Vicryl. The Gelport was removed. The anterior fascia was closed with a running 1 PDS x .  The subcutaneous tissue was closed using 2-0 vicryl. All the incisions were irrigated, patted dry, and then the skin was reapproximated with 4-0 Monocryl in a subcuticular fashion. The wounds were cleaned and dried and covered with Dermabond. All sponge,  needle, and instrument counts were reported correct x2.   The patient was awakened from anesthesia and transferred to recovery in stable condition. There were no complications. The patient tolerated the procedure well.  An assistant was required for this surgical  procedure. The duties of the assistant included but were not limited to suctioning, passing suture, camera manipulation, retraction. This procedure would not be able to be performed without an assistant.   ______________________________    Hollice Espy, MD

## 2021-02-17 NOTE — Plan of Care (Signed)
  Problem: Skin Integrity: Goal: Demonstration of wound healing without infection will improve 02/17/2021 1855 by Su Hoff, RN Outcome: Progressing 02/17/2021 1854 by Su Hoff, RN Outcome: Progressing   Problem: Clinical Measurements: Goal: Ability to maintain clinical measurements within normal limits will improve Outcome: Progressing   Problem: Clinical Measurements: Goal: Will remain free from infection Outcome: Progressing   Problem: Clinical Measurements: Goal: Diagnostic test results will improve Outcome: Progressing   Problem: Clinical Measurements: Goal: Respiratory complications will improve Outcome: Progressing   Problem: Clinical Measurements: Goal: Cardiovascular complication will be avoided Outcome: Progressing   Problem: Activity: Goal: Risk for activity intolerance will decrease Outcome: Progressing   Problem: Nutrition: Goal: Adequate nutrition will be maintained Outcome: Progressing   Problem: Coping: Goal: Level of anxiety will decrease Outcome: Progressing   Problem: Elimination: Goal: Will not experience complications related to bowel motility Outcome: Progressing   Problem: Pain Managment: Goal: General experience of comfort will improve Outcome: Progressing   Problem: Safety: Goal: Ability to remain free from injury will improve Outcome: Progressing   Problem: Skin Integrity: Goal: Risk for impaired skin integrity will decrease Outcome: Progressing

## 2021-02-17 NOTE — Plan of Care (Signed)
  Problem: Education: Goal: Required Educational Video(s) Outcome: Progressing   Problem: Clinical Measurements: Goal: Ability to maintain clinical measurements within normal limits will improve Outcome: Progressing Goal: Postoperative complications will be avoided or minimized Outcome: Progressing   Problem: Skin Integrity: Goal: Demonstration of wound healing without infection will improve Outcome: Progressing   

## 2021-02-17 NOTE — Anesthesia Procedure Notes (Signed)
Procedure Name: Intubation Date/Time: 02/17/2021 8:45 AM Performed by: Sherlie Ban, RN Pre-anesthesia Checklist: Patient identified, Patient being monitored, Timeout performed, Emergency Drugs available and Suction available Patient Re-evaluated:Patient Re-evaluated prior to induction Oxygen Delivery Method: Circle system utilized Preoxygenation: Pre-oxygenation with 100% oxygen Induction Type: IV induction Ventilation: Mask ventilation without difficulty Laryngoscope Size: Mac and 3 Grade View: Grade I Tube type: Oral Tube size: 7.0 mm Number of attempts: 1 Airway Equipment and Method: Stylet Placement Confirmation: ETT inserted through vocal cords under direct vision, positive ETCO2 and breath sounds checked- equal and bilateral Secured at: 20 cm Tube secured with: Tape Dental Injury: Teeth and Oropharynx as per pre-operative assessment

## 2021-02-18 ENCOUNTER — Encounter: Payer: Self-pay | Admitting: Obstetrics and Gynecology

## 2021-02-18 DIAGNOSIS — N2889 Other specified disorders of kidney and ureter: Secondary | ICD-10-CM

## 2021-02-18 LAB — BPAM RBC
Blood Product Expiration Date: 202212082359
Blood Product Expiration Date: 202212082359
Blood Product Expiration Date: 202212082359
Blood Product Expiration Date: 202212082359
Unit Type and Rh: 6200
Unit Type and Rh: 6200
Unit Type and Rh: 6200
Unit Type and Rh: 6200

## 2021-02-18 LAB — TYPE AND SCREEN
ABO/RH(D): A POS
Antibody Screen: NEGATIVE
Unit division: 0
Unit division: 0
Unit division: 0
Unit division: 0

## 2021-02-18 LAB — BASIC METABOLIC PANEL
Anion gap: 5 (ref 5–15)
BUN: 12 mg/dL (ref 6–20)
CO2: 22 mmol/L (ref 22–32)
Calcium: 7.8 mg/dL — ABNORMAL LOW (ref 8.9–10.3)
Chloride: 108 mmol/L (ref 98–111)
Creatinine, Ser: 0.59 mg/dL (ref 0.44–1.00)
GFR, Estimated: >60 mL/min (ref >60)
Glucose, Bld: 129 mg/dL — ABNORMAL HIGH (ref 70–99)
Potassium: 3.8 mmol/L (ref 3.5–5.1)
Sodium: 135 mmol/L (ref 135–145)

## 2021-02-18 LAB — CBC
HCT: 36.2 % (ref 36.0–46.0)
Hemoglobin: 11.9 g/dL — ABNORMAL LOW (ref 12.0–15.0)
MCH: 26.4 pg (ref 26.0–34.0)
MCHC: 32.9 g/dL (ref 30.0–36.0)
MCV: 80.4 fL (ref 80.0–100.0)
Platelets: 363 10*3/uL (ref 150–400)
RBC: 4.5 MIL/uL (ref 3.87–5.11)
RDW: 16.5 % — ABNORMAL HIGH (ref 11.5–15.5)
WBC: 12.4 10*3/uL — ABNORMAL HIGH (ref 4.0–10.5)
nRBC: 0 % (ref 0.0–0.2)

## 2021-02-18 LAB — PREPARE RBC (CROSSMATCH)

## 2021-02-18 NOTE — Progress Notes (Signed)
Daily Benign Gynecology Progress Note Mariah Lang  [id number]   Chief Complaint: Post-op care  POD#1  Subjective:  Overnight Events: no acute events Complaints: soreness in abdomen She denies: fevers, chills, chest pain, trouble breathing, nausea, vomiting, severe abdominal pain.  She has tolerated: clear liquids She reports her pain is well controlled with IV pain medication and some oral medication.   She is not ambulating and is not voiding due to still having a catheter.   Objective:  Most recent vitals Temp: 98.8 F (37.1 C)  BP: 103/63  Pulse Rate: 92  Resp: 19  SpO2: 93 %   Vitals Range over 24 hours Temp  Avg: 98 F (36.7 C)  Min: 97 F (36.1 C)  Max: 99.8 F (37.7 C) BP  Min: 88/60  Max: 143/78 Pulse  Avg: 76.3  Min: 70  Max: 92 SpO2  Avg: 96.4 %  Min: 93 %  Max: 99 %   Urine Output: 815 mL over the last 12 hours.    Physical Exam General: alert, well appearing, and in no distress Heart: regular rate and rhythm Lungs: clear to auscultation, no wheezes, rales or rhonchi, symmetric air entry Abdomen: s/mild ttp (appropriate)scant bowel sounds/mild distension.  Incision: all clean/dry/intact Extremities: SCDs in place  AM Labs Lab Results  Component Value Date   WBC 12.4 (H) 02/18/2021   HGB 11.9 (L) 02/18/2021   HCT 36.2 02/18/2021   PLT 363 02/18/2021   NA 135 02/18/2021   K 3.8 02/18/2021   CREATININE 0.59 02/18/2021   BUN 12 02/18/2021   INR 1.0 02/09/2021     Assessment:  Mariah Lang Mariah Lang is a 50 y.o. female POD#1 s/p robot assisted TLH/BSO/myomectomy, and left radical nephrectomy.    Plan:   Continue care per primary team.  Usual discharge indications.    Prentice Docker, MD  02/18/2021 9:02 AM

## 2021-02-18 NOTE — Progress Notes (Addendum)
Urology Inpatient Progress Note  Subjective: No acute events overnight.  She is afebrile, VSS. WBC count up today, 12.4.  Hemoglobin down today, 11.9. Foley catheter in place draining clear, yellow urine. Patient reports no appetite and she has not yet passed flatus.  She has been regurgitating water when she sips on it.  She is mostly concerned about abdominal pain.  Anti-infectives: Anti-infectives (From admission, onward)    Start     Dose/Rate Route Frequency Ordered Stop   02/17/21 2100  ceFAZolin (ANCEF) IVPB 1 g/50 mL premix        1 g 100 mL/hr over 30 Minutes Intravenous Every 8 hours 02/17/21 1740 02/18/21 0630   02/17/21 0624  ceFAZolin (ANCEF) 2-4 GM/100ML-% IVPB       Note to Pharmacy: [name]  : cabinet override      02/17/21 0624 02/17/21 0927   02/17/21 0615  metroNIDAZOLE (FLAGYL) IVPB 500 mg        500 mg 100 mL/hr over 60 Minutes Intravenous  Once 02/17/21 0605 02/17/21 0828   02/17/21 0605  ceFAZolin (ANCEF) IVPB 2g/100 mL premix        2 g 200 mL/hr over 30 Minutes Intravenous 30 min pre-op 02/17/21 5366 02/17/21 1248       Current Facility-Administered Medications  Medication Dose Route Frequency Provider Last Rate Last Admin   0.9 %  sodium chloride infusion   Intravenous Continuous Hollice Espy, MD 100 mL/hr at 02/18/21 0600 New Bag at 02/18/21 0600   acetaminophen (TYLENOL) tablet 650 mg  650 mg Oral Q4H PRN Hollice Espy, MD       Chlorhexidine Gluconate Cloth 2 % PADS 6 each  6 each Topical Q0600 Hollice Espy, MD       diphenhydrAMINE (BENADRYL) injection 12.5 mg  12.5 mg Intravenous Q6H PRN Hollice Espy, MD       Or   diphenhydrAMINE (BENADRYL) 12.5 MG/5ML elixir 12.5 mg  12.5 mg Oral Q6H PRN Hollice Espy, MD       docusate sodium (COLACE) capsule 100 mg  100 mg Oral BID Hollice Espy, MD   100 mg at 02/17/21 2118   heparin injection 5,000 Units  5,000 Units Subcutaneous Q8H Hollice Espy, MD   5,000 Units at 02/18/21 0523    morphine 2 MG/ML injection 2-4 mg  2-4 mg Intravenous Q2H PRN Hollice Espy, MD   2 mg at 02/18/21 0802   ondansetron (ZOFRAN) injection 4 mg  4 mg Intravenous Q4H PRN Hollice Espy, MD       oxybutynin (DITROPAN) tablet 5 mg  5 mg Oral Q8H PRN Hollice Espy, MD       oxybutynin (DITROPAN-XL) 24 hr tablet 10 mg  10 mg Oral Daily Hollice Espy, MD   10 mg at 02/17/21 2120   oxyCODONE-acetaminophen (PERCOCET/ROXICET) 5-325 MG per tablet 1-2 tablet  1-2 tablet Oral Q4H PRN Hollice Espy, MD   2 tablet at 02/18/21 0523   Objective: Vital signs in last 24 hours: Temp:  [97 F (36.1 C)-99.8 F (37.7 C)] 99.8 F (37.7 C) (11/17 0523) Pulse Rate:  [70-89] 89 (11/17 0523) Resp:  [10-24] 16 (11/17 0523) BP: (88-143)/(60-87) 114/68 (11/17 0523) SpO2:  [94 %-99 %] 94 % (11/17 0523)  Intake/Output from previous day: 11/16 0701 - 11/17 0700 In: 2947.2 [P.O.:40; I.V.:2627.2; IV Piggyback:280] Out: 1000 [Urine:750; Blood:250] Intake/Output this shift: No intake/output data recorded.  Physical Exam Vitals and nursing note reviewed.  Constitutional:      General: She is  not in acute distress.    Appearance: She is not ill-appearing, toxic-appearing or diaphoretic.  HENT:     Head: Normocephalic and atraumatic.  Pulmonary:     Effort: Pulmonary effort is normal. No respiratory distress.  Abdominal:     Palpations: Abdomen is soft.     Tenderness: There is abdominal tenderness. There is no guarding or rebound.     Comments: Surgical incisions noted over the anterior abdomen, all clean, dry, and intact with overlying surgical adhesive.  Skin:    General: Skin is warm and dry.  Neurological:     Mental Status: She is alert and oriented to person, place, and time.  Psychiatric:        Mood and Affect: Mood normal.        Behavior: Behavior normal.   Lab Results:  Recent Labs    02/18/21 0556  WBC 12.4*  HGB 11.9*  HCT 36.2  PLT 363   BMET Recent Labs    02/18/21 0556   NA 135  K 3.8  CL 108  CO2 22  GLUCOSE 129*  BUN 12  CREATININE 0.59  CALCIUM 7.8*   Assessment & Plan: 50 year old female POD 1 from hand-assisted laparoscopic left radical nephrectomy with Dr. Erlene Quan for management of a large left renal mass and robotic assisted total hysterectomy and bilateral salpingo-oophorectomy with Dr. Theora Gianotti for management of multiple uterine leiomyomas.  A.m. labs with normal postoperative changes.  No evidence of acute abdomen today.  Pain in proportion with a degree of intraoperative manipulation yesterday.  We will plan to keep her on a clear liquid diet.  We will advance diet as tolerated at midday if she starts passing flatus and develops an appetite.  Encouraged the patient to ambulate today.  Recommend primarily using p.o. pain medications and reserving IV for breakthrough pain.  Likely discharge this afternoon versus tomorrow pending ambulation pain control, and toleration of p.o. intake.  Okay to discontinue Foley catheter, order placed.  Debroah Loop, PA-C 02/18/2021

## 2021-02-18 NOTE — Anesthesia Postprocedure Evaluation (Signed)
Anesthesia Post Note  Patient: Mariah Lang  Procedure(s) Performed: LAPAROTOMY OR HAND ASSIST XI ROBOTIC ASSISTED LAPAROSCOPIC HYSTERECTOMY AND SALPINGECTOMY (Bilateral) HAND ASSISTED LAPAROSCOPIC RADICAL  NEPHRECTOMY (Left)  Patient location during evaluation: PACU Anesthesia Type: General Level of consciousness: awake and alert Pain management: pain level not controlled (Being addressed by primary team) Vital Signs Assessment: post-procedure vital signs reviewed and stable Respiratory status: spontaneous breathing, nonlabored ventilation, respiratory function stable and patient connected to nasal cannula oxygen Cardiovascular status: blood pressure returned to baseline and stable Postop Assessment: no apparent nausea or vomiting Anesthetic complications: no   No notable events documented.   Last Vitals:  Vitals:   02/17/21 2022 02/18/21 0523  BP: (!) 143/78 114/68  Pulse: 80 89  Resp: 18 16  Temp: 36.9 C 37.7 C  SpO2: 95% 94%    Last Pain:  Vitals:   02/18/21 0523  TempSrc: Oral  PainSc: 10-Worst pain ever                 [name]

## 2021-02-19 ENCOUNTER — Inpatient Hospital Stay: Payer: Self-pay

## 2021-02-19 DIAGNOSIS — J690 Pneumonitis due to inhalation of food and vomit: Secondary | ICD-10-CM

## 2021-02-19 DIAGNOSIS — I2699 Other pulmonary embolism without acute cor pulmonale: Secondary | ICD-10-CM

## 2021-02-19 DIAGNOSIS — I2694 Multiple subsegmental pulmonary emboli without acute cor pulmonale: Secondary | ICD-10-CM

## 2021-02-19 DIAGNOSIS — J9601 Acute respiratory failure with hypoxia: Secondary | ICD-10-CM

## 2021-02-19 LAB — PROCALCITONIN: Procalcitonin: 0.9 ng/mL

## 2021-02-19 LAB — COMPREHENSIVE METABOLIC PANEL
ALT: 34 U/L (ref 0–44)
AST: 43 U/L — ABNORMAL HIGH (ref 15–41)
Albumin: 2.9 g/dL — ABNORMAL LOW (ref 3.5–5.0)
Alkaline Phosphatase: 57 U/L (ref 38–126)
Anion gap: 3 — ABNORMAL LOW (ref 5–15)
BUN: 10 mg/dL (ref 6–20)
CO2: 25 mmol/L (ref 22–32)
Calcium: 8 mg/dL — ABNORMAL LOW (ref 8.9–10.3)
Chloride: 105 mmol/L (ref 98–111)
Creatinine, Ser: 0.68 mg/dL (ref 0.44–1.00)
GFR, Estimated: >60 mL/min (ref >60)
Glucose, Bld: 104 mg/dL — ABNORMAL HIGH (ref 70–99)
Potassium: 3.5 mmol/L (ref 3.5–5.1)
Sodium: 133 mmol/L — ABNORMAL LOW (ref 135–145)
Total Bilirubin: 0.8 mg/dL (ref 0.3–1.2)
Total Protein: 6.4 g/dL — ABNORMAL LOW (ref 6.5–8.1)

## 2021-02-19 LAB — CBC
HCT: 33.1 % — ABNORMAL LOW (ref 36.0–46.0)
Hemoglobin: 10.5 g/dL — ABNORMAL LOW (ref 12.0–15.0)
MCH: 26.2 pg (ref 26.0–34.0)
MCHC: 31.7 g/dL (ref 30.0–36.0)
MCV: 82.5 fL (ref 80.0–100.0)
Platelets: 323 10*3/uL (ref 150–400)
RBC: 4.01 MIL/uL (ref 3.87–5.11)
RDW: 17 % — ABNORMAL HIGH (ref 11.5–15.5)
WBC: 8.7 10*3/uL (ref 4.0–10.5)
nRBC: 0 % (ref 0.0–0.2)

## 2021-02-19 IMAGING — CT CT ANGIO CHEST
2 of 7 series · 17 of 46 positions shown · IV contrast (APPLIED)
Comparison: Chest CT [DATE].

CLINICAL DATA: 49-year-old female status post left nephrectomy and
hysterectomy on [DATE]. Shortness of breath.

EXAM:
CT ANGIOGRAPHY CHEST WITH CONTRAST
TECHNIQUE: Multidetector CT imaging of the chest was performed using the
standard protocol during bolus administration of intravenous
contrast. Multiplanar CT image reconstructions and MIPs were
obtained to evaluate the vascular anatomy.
CONTRAST:  75mL OMNIPAQUE IOHEXOL 350 MG/ML SOLN

[Series 5: thins · axial · 0.70mm/px · z∈[-632,-409]mm · 14 of 311 slices shown]
[im 16/311  lung]
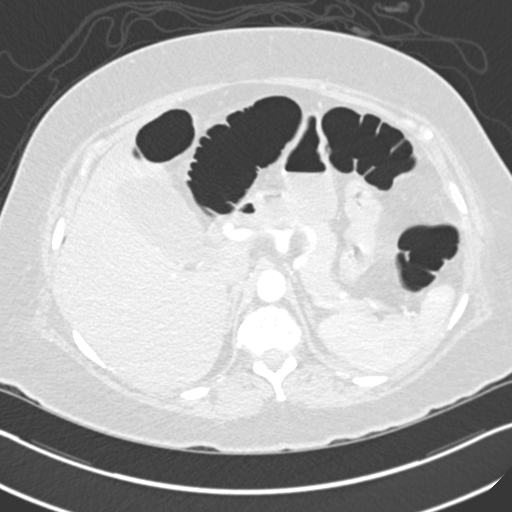
[im 47/311  soft-tissue]
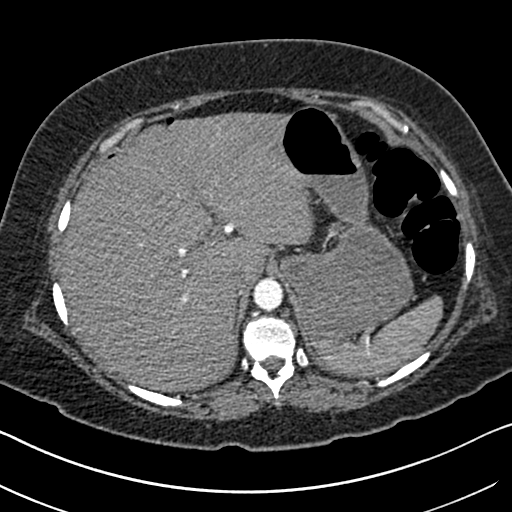
[im 63/311  lung]
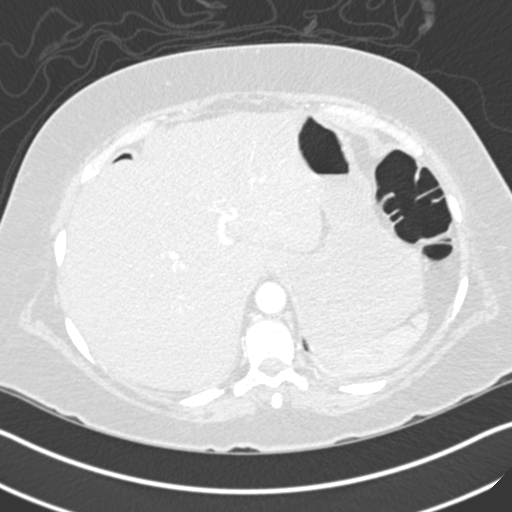
[im 78/311  soft-tissue]
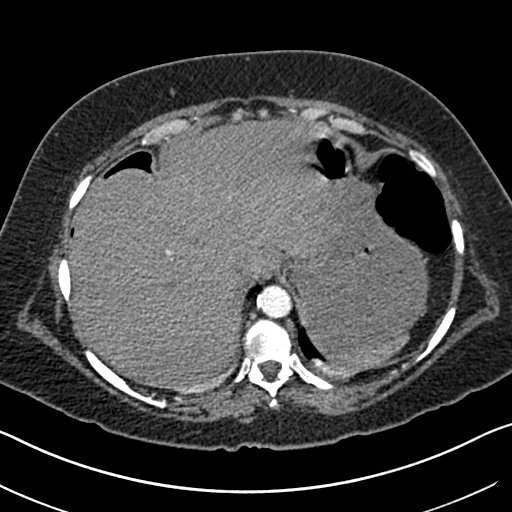
[im 109/311  lung]
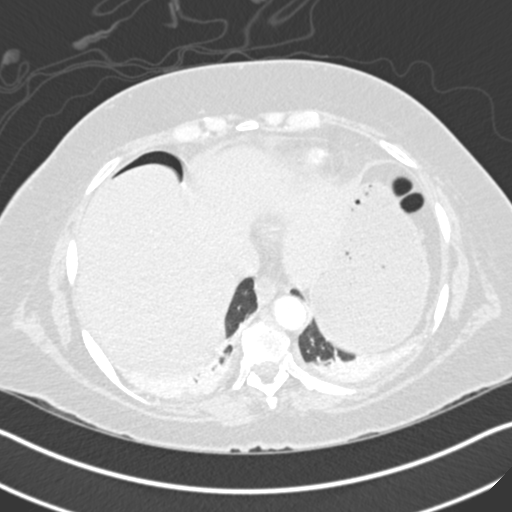
[im 125/311  soft-tissue]
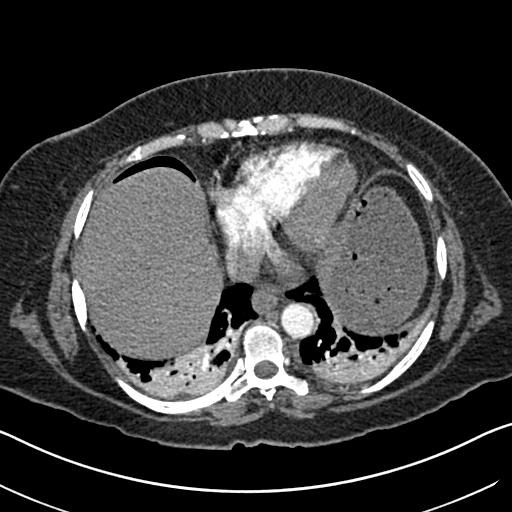
[im 140/311  lung]
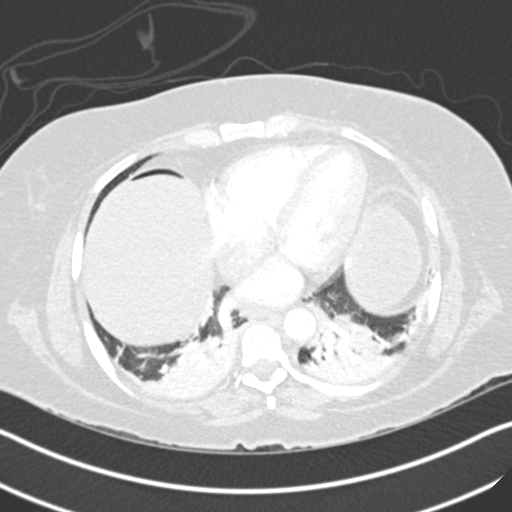
[im 171/311  soft-tissue]
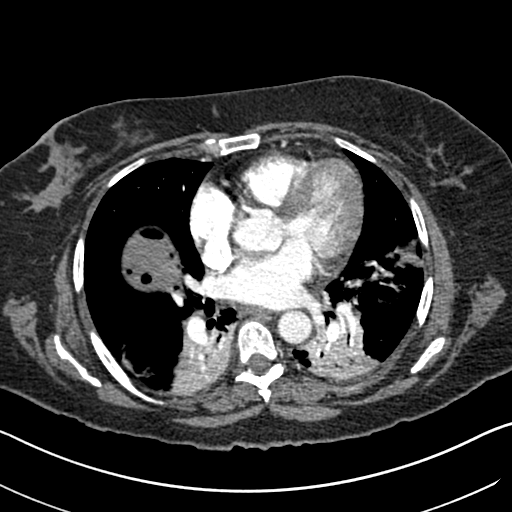
[im 187/311  lung]
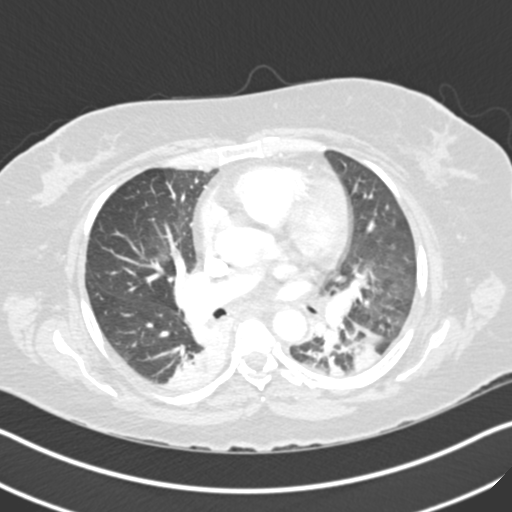
[im 202/311  soft-tissue]
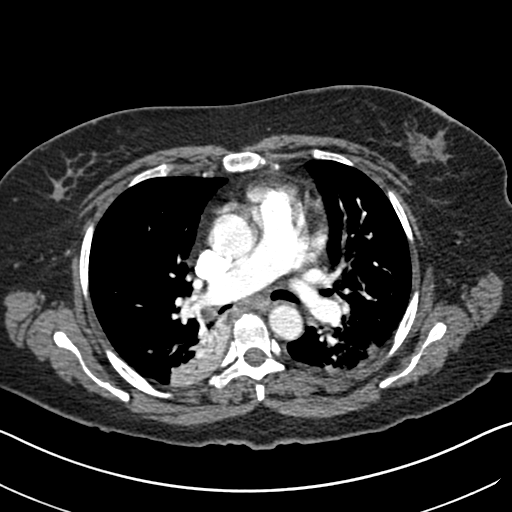
[im 233/311  lung]
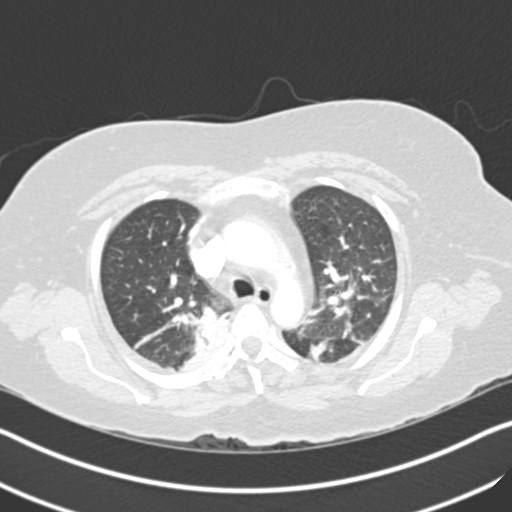
[im 249/311  soft-tissue]
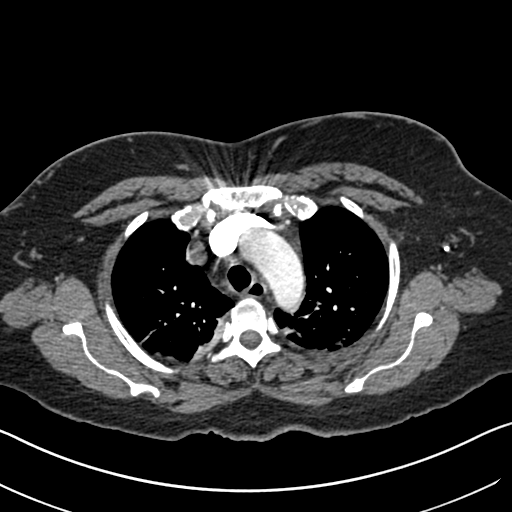
[im 264/311  lung]
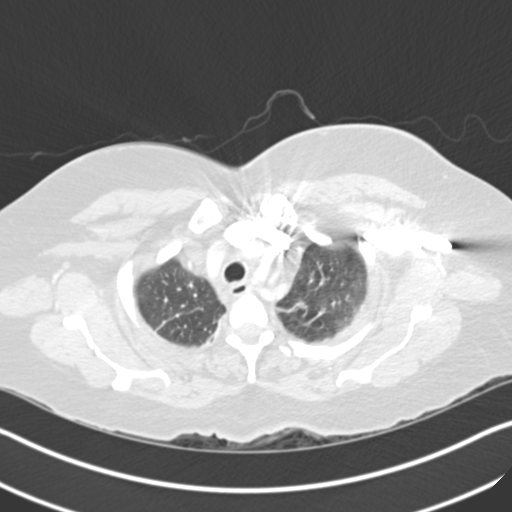
[im 295/311  soft-tissue]
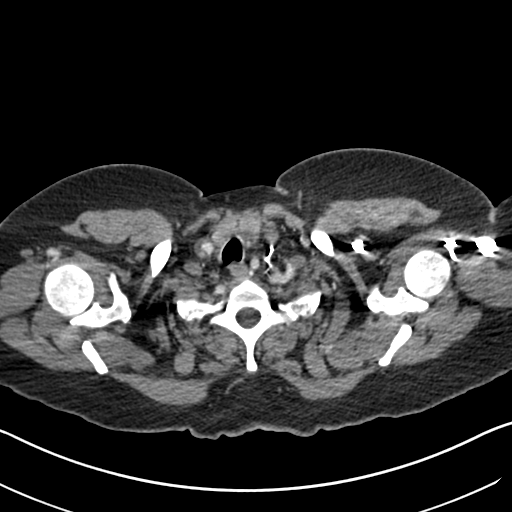

[Series 7: coronal mpr · coronal · 0.49mm/px · 3 of 87 slices shown]
[im 22/87  soft-tissue]
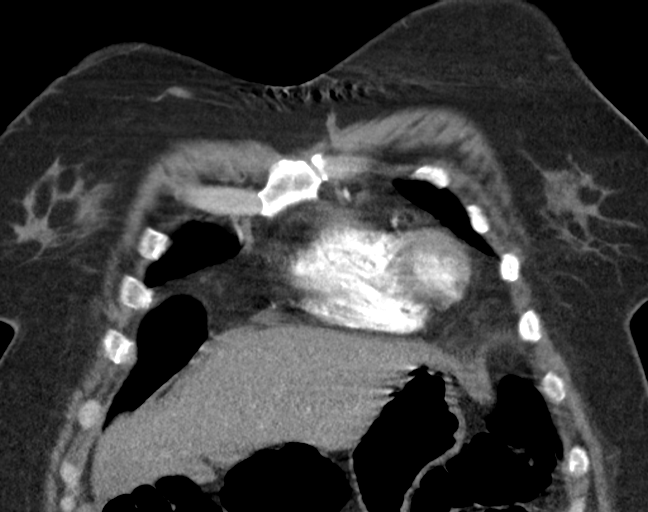
[im 44/87  soft-tissue]
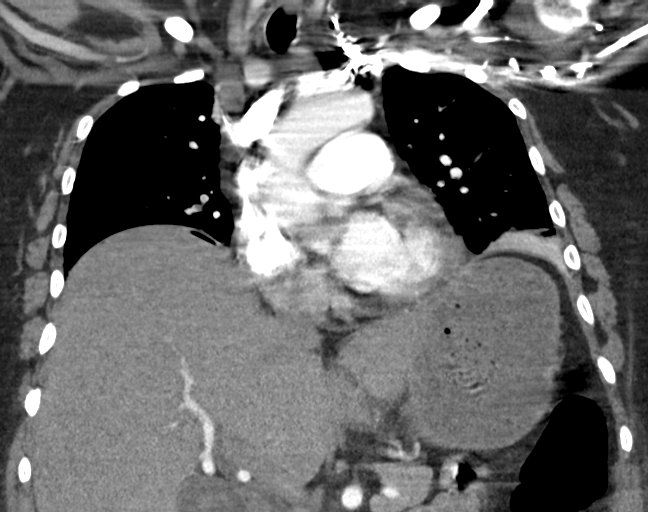
[im 65/87  soft-tissue]
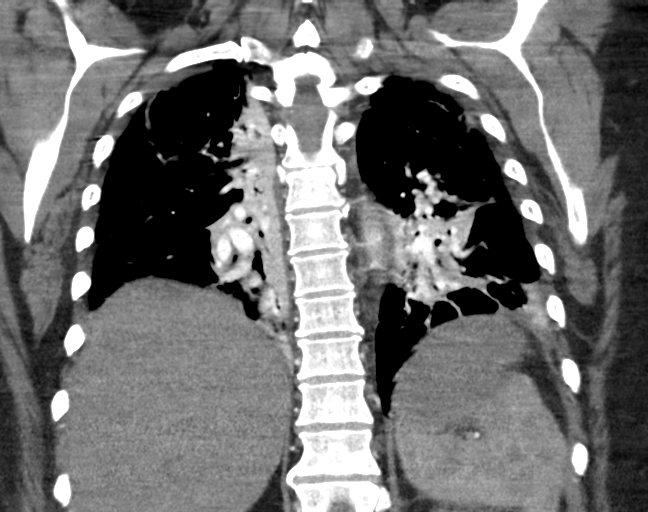

[17 of 46 positions shown; findings below may reference images not displayed]

FINDINGS: Cardiovascular: There are several filling defects within the
pulmonary arterial tree of the right lung, compatible with pulmonary
embolism. These involve segmental and subsegmental sized vessels and
appear predominantly nonocclusive at this time. Heart size is mildly
enlarged. There is no significant pericardial fluid, thickening or
pericardial calcification. Aortic atherosclerosis. No definite
coronary artery calcifications.

Mediastinum/Nodes: No pathologically enlarged mediastinal or hilar
lymph nodes. Esophagus is unremarkable in appearance. No axillary
lymphadenopathy.

Lungs/Pleura: Extensive areas of atelectasis and airspace
consolidation are noted in the dependent portions of the lungs
bilaterally, most severe in the lower lobes, likely reflective of
recent aspiration. No pneumothorax. No pleural effusions.

Upper Abdomen: Large amount of pneumoperitoneum noted beneath the
right hemidiaphragm. Multiple dilated loops of bowel in the
visualized portions of the abdomen, suggesting a postoperative
ileus.

Musculoskeletal: There are no aggressive appearing lytic or blastic
lesions noted in the visualized portions of the skeleton.

Review of the MIP images confirms the above findings.
IMPRESSION: 1. Study is positive for segmental and subsegmental sized pulmonary
embolism in the right lung, which appears predominantly nonocclusive
at this time.
2. Extensive dependent areas of atelectasis and airspace
consolidation in the lungs bilaterally, suggesting sequela of recent
aspiration.
3. Pneumoperitoneum, presumably related to recent laparotomy.
Dilated loops of bowel in the visualized abdomen suggesting
postoperative ileus.
4. Aortic atherosclerosis.
5. Cholelithiasis.

These results will be called to the ordering clinician or
representative by the Radiologist Assistant, and communication
documented in the PACS or [REDACTED].

Aortic Atherosclerosis ([CT]-[CT]).

## 2021-02-19 MED ORDER — IOHEXOL 350 MG/ML SOLN
75.0000 mL | Freq: Once | INTRAVENOUS | Status: AC | PRN
  Administered 2021-02-19: 75 mL via INTRAVENOUS

## 2021-02-19 MED ORDER — APIXABAN 5 MG PO TABS: 5.0000 mg | ORAL_TABLET | Freq: Two times a day (BID) | ORAL | Status: DC

## 2021-02-19 MED ORDER — APIXABAN 5 MG PO TABS
10.0000 mg | ORAL_TABLET | Freq: Two times a day (BID) | ORAL | Status: DC
  Administered 2021-02-19 – 2021-02-21 (×5): 10 mg via ORAL
  Filled 2021-02-19 (×5): qty 2

## 2021-02-19 MED ORDER — POTASSIUM CHLORIDE 20 MEQ PO PACK
40.0000 meq | PACK | Freq: Once | ORAL | Status: AC
  Administered 2021-02-19: 40 meq via ORAL
  Filled 2021-02-19: qty 2

## 2021-02-19 MED ORDER — SODIUM CHLORIDE 0.9 % IV SOLN: INTRAVENOUS | Status: DC | PRN

## 2021-02-19 MED ORDER — SODIUM CHLORIDE 0.9 % IV SOLN
3.0000 g | Freq: Four times a day (QID) | INTRAVENOUS | Status: DC
  Administered 2021-02-19 – 2021-02-21 (×7): 3 g via INTRAVENOUS
  Filled 2021-02-19 (×3): qty 8
  Filled 2021-02-19: qty 3
  Filled 2021-02-19 (×5): qty 8
  Filled 2021-02-19: qty 3

## 2021-02-19 NOTE — Consult Note (Signed)
Medical Consultation   [name]  EHU:[id number]  DOB: [birth year]  DOA: 02/17/2021  PCP: Charlott Rakes, MD     Requesting physician: Dr. Bernardo Heater  Reason for consultation: PE   History of Present Illness: Mariah Lang is an 50 y.o. female with history of iron deficient anemia, thrombocytosis, left renal mass who present to the hospital for elective left nephrostomy which was performed on 11/16.  Patient also had a total hysterectomy at the same time. Patient started having shortness of breath last night, it happened during the rest and worse with exertion.  She did not have any chest pain.  She has a dry cough which is essentially nonproductive.  She developed hypoxemia earlier this morning, was placed on 3 L oxygen.  A CT angiogram chest was performed, showed bilateral lower lobe airspace disease, segmental right-sided PE.      Review of Systems:  Review of Systems  Constitutional:  Negative for chills and fever.  HENT:  Negative for congestion and ear discharge.   Eyes:  Negative for double vision and photophobia.  Respiratory:  Positive for cough and shortness of breath. Negative for hemoptysis, sputum production and wheezing.   Cardiovascular:  Negative for chest pain, palpitations and orthopnea.  Gastrointestinal:  Positive for nausea. Negative for constipation, diarrhea, heartburn and vomiting.  Genitourinary:  Negative for dysuria and urgency.  Musculoskeletal:  Negative for back pain and neck pain.  Neurological:  Negative for dizziness and weakness.  Psychiatric/Behavioral:  Negative for depression and substance abuse.      Past Medical History: Past Medical History:  Diagnosis Date   Anemia     Past Surgical History: Past Surgical History:  Procedure Laterality Date   APPENDECTOMY     CESAREAN SECTION  2000   LAPAROSCOPIC NEPHRECTOMY, HAND ASSISTED Left 02/17/2021   Procedure: HAND ASSISTED  LAPAROSCOPIC RADICAL  NEPHRECTOMY;  Surgeon: Hollice Espy, MD;  Location: [facility];  Service: Urology;  Laterality: Left;   LAPAROTOMY  02/17/2021   Procedure: LAPAROTOMY OR HAND ASSIST;  Surgeon: Gillis Ends, MD;  Location: [facility];  Service: Gynecology;;  Fibroid excision    OTHER SURGICAL HISTORY  1993   c-section   ROBOTIC ASSISTED LAPAROSCOPIC HYSTERECTOMY AND SALPINGECTOMY Bilateral 02/17/2021   Procedure: XI ROBOTIC ASSISTED LAPAROSCOPIC HYSTERECTOMY AND SALPINGECTOMY;  Surgeon: Gillis Ends, MD;  Location: [facility];  Service: Gynecology;  Laterality: Bilateral;     Allergies:  No Known Allergies   Social History:  reports that she has never smoked. She has never used smokeless tobacco. She reports that she does not drink alcohol and does not use drugs.   Family History: Family History  Problem Relation Age of Onset   Diabetes Mother    Hypertension Mother    Diabetes Paternal Grandmother        Physical Exam: Vitals:   02/19/21 0600 02/19/21 0904 02/19/21 0937 02/19/21 0940  BP:  (!) 153/82    Pulse:  (!) 111 (!) 115   Resp:  20    Temp:  98.4 F (36.9 C)    TempSrc:  Oral    SpO2: 95% (!) 84% (!) 83% 92%  Weight:      Height:        Constitutional: Appearance,  Alert and awake, oriented x3, not in any acute distress. Eyes: PERLA, EOMI, irises appear normal, anicteric sclera,  ENMT: external ears and nose appear  normal, normal hearing or hard of hearing            Lips appears normal, oropharynx mucosa, tongue, posterior pharynx appear normal  Neck: neck appears normal, no masses, normal ROM, no thyromegaly, no JVD  CVS: S1-S2 clear, no murmur rubs or gallops, no LE edema, normal pedal pulses  Respiratory: Decreased breathing sounds bilaterally, no wheezing, rales or rhonchi. Respiratory effort normal. No accessory muscle use.  Abdomen: soft mildly tender, nondistended, normal bowel sounds, no hepatosplenomegaly, no hernias   Musculoskeletal: : no cyanosis, clubbing or edema noted bilaterally  Neuro: Cranial nerves II-XII intact, strength, sensation, reflexes Psych: judgement and insight appear normal, stable mood and affect, mental status Skin: no rashes or lesions or ulcers, no induration or nodules     Data reviewed:  I have personally reviewed following labs and imaging studies Labs:  CBC: Recent Labs  Lab 02/18/21 0556 02/19/21 0425  WBC 12.4* 8.7  HGB 11.9* 10.5*  HCT 36.2 33.1*  MCV 80.4 82.5  PLT 363 132    Basic Metabolic Panel: Recent Labs  Lab 02/18/21 0556 02/19/21 0425  NA 135 133*  K 3.8 3.5  CL 108 105  CO2 22 25  GLUCOSE 129* 104*  BUN 12 10  CREATININE 0.59 0.68  CALCIUM 7.8* 8.0*   GFR Estimated Creatinine Clearance: 81.2 mL/min (by C-G formula based on SCr of 0.68 mg/dL). Liver Function Tests: Recent Labs  Lab 02/19/21 0425  AST 43*  ALT 34  ALKPHOS 57  BILITOT 0.8  PROT 6.4*  ALBUMIN 2.9*   No results for input(s): LIPASE, AMYLASE in the last 168 hours. No results for input(s): AMMONIA in the last 168 hours. Coagulation profile No results for input(s): INR, PROTIME in the last 168 hours.  Cardiac Enzymes: No results for input(s): CKTOTAL, CKMB, CKMBINDEX, TROPONINI in the last 168 hours. BNP: Invalid input(s): POCBNP CBG: Recent Labs  Lab 02/17/21 0639  GLUCAP 92   D-Dimer No results for input(s): DDIMER in the last 72 hours. Hgb A1c No results for input(s): HGBA1C in the last 72 hours. Lipid Profile No results for input(s): CHOL, HDL, LDLCALC, TRIG, CHOLHDL, LDLDIRECT in the last 72 hours. Thyroid function studies No results for input(s): TSH, T4TOTAL, T3FREE, THYROIDAB in the last 72 hours.  Invalid input(s): FREET3 Anemia work up No results for input(s): VITAMINB12, FOLATE, FERRITIN, TIBC, IRON, RETICCTPCT in the last 72 hours. Urinalysis    Component Value Date/Time   COLORURINE YELLOW (A) 02/09/2021 0931   APPEARANCEUR HAZY (A)  02/09/2021 0931   APPEARANCEUR Clear 11/25/2020 1019   LABSPEC 1.021 02/09/2021 0931   PHURINE 5.0 02/09/2021 0931   GLUCOSEU NEGATIVE 02/09/2021 0931   HGBUR MODERATE (A) 02/09/2021 0931   BILIRUBINUR NEGATIVE 02/09/2021 0931   BILIRUBINUR Negative 11/25/2020 1019   KETONESUR NEGATIVE 02/09/2021 0931   PROTEINUR NEGATIVE 02/09/2021 0931   UROBILINOGEN 1.0 11/15/2020 1715   NITRITE NEGATIVE 02/09/2021 0931   LEUKOCYTESUR TRACE (A) 02/09/2021 0931     Microbiology Recent Results (from the past 240 hour(s))  SARS CORONAVIRUS 2 (TAT 6-24 HRS) Nasopharyngeal Nasopharyngeal Swab     Status: None   Collection Time: 02/15/21  8:45 AM   Specimen: Nasopharyngeal Swab  Result Value Ref Range Status   SARS Coronavirus 2 NEGATIVE NEGATIVE Final    Comment: (NOTE) SARS-CoV-2 target nucleic acids are NOT DETECTED.  The SARS-CoV-2 RNA is generally detectable in upper and lower respiratory specimens during the acute phase of infection. Negative results do not preclude  SARS-CoV-2 infection, do not rule out co-infections with other pathogens, and should not be used as the sole basis for treatment or other patient management decisions. Negative results must be combined with clinical observations, patient history, and epidemiological information. The expected result is Negative.  Fact Sheet for Patients: SugarRoll.be  Fact Sheet for Healthcare Providers: https://www.woods-mathews.com/  This test is not yet approved or cleared by the [place] FDA and  has been authorized for detection and/or diagnosis of SARS-CoV-2 by FDA under an Emergency Use Authorization (EUA). This EUA will remain  in effect (meaning this test can be used) for the duration of the COVID-19 declaration under Se ction 564(b)(1) of the Act, 21 U.S.C. section 360bbb-3(b)(1), unless the authorization is terminated or revoked sooner.  Performed at [facility], [place]  [address], [place], [place] [address]        Inpatient Medications:   Scheduled Meds:  apixaban  10 mg Oral BID   Followed by   Derrill Memo ON 02/26/2021] apixaban  5 mg Oral BID   docusate sodium  100 mg Oral BID   oxybutynin  10 mg Oral Daily   Continuous Infusions:  sodium chloride Stopped (02/18/21 1405)     Radiological Exams on Admission: CT Angio Chest Pulmonary Embolism (PE) W or WO Contrast  Result Date: 02/19/2021 CLINICAL DATA:  50 year old female status post left nephrectomy and hysterectomy on 02/17/2021. Shortness of breath. EXAM: CT ANGIOGRAPHY CHEST WITH CONTRAST TECHNIQUE: Multidetector CT imaging of the chest was performed using the standard protocol during bolus administration of intravenous contrast. Multiplanar CT image reconstructions and MIPs were obtained to evaluate the vascular anatomy. CONTRAST:  69mL OMNIPAQUE IOHEXOL 350 MG/ML SOLN COMPARISON:  Chest CT 12/18/2020. FINDINGS: Cardiovascular: There are several filling defects within the pulmonary arterial tree of the right lung, compatible with pulmonary embolism. These involve segmental and subsegmental sized vessels and appear predominantly nonocclusive at this time. Heart size is mildly enlarged. There is no significant pericardial fluid, thickening or pericardial calcification. Aortic atherosclerosis. No definite coronary artery calcifications. Mediastinum/Nodes: No pathologically enlarged mediastinal or hilar lymph nodes. Esophagus is unremarkable in appearance. No axillary lymphadenopathy. Lungs/Pleura: Extensive areas of atelectasis and airspace consolidation are noted in the dependent portions of the lungs bilaterally, most severe in the lower lobes, likely reflective of recent aspiration. No pneumothorax. No pleural effusions. Upper Abdomen: Large amount of pneumoperitoneum noted beneath the right hemidiaphragm. Multiple dilated loops of bowel in the visualized portions of the abdomen, suggesting a postoperative ileus.  Musculoskeletal: There are no aggressive appearing lytic or blastic lesions noted in the visualized portions of the skeleton. Review of the MIP images confirms the above findings. IMPRESSION: 1. Study is positive for segmental and subsegmental sized pulmonary embolism in the right lung, which appears predominantly nonocclusive at this time. 2. Extensive dependent areas of atelectasis and airspace consolidation in the lungs bilaterally, suggesting sequela of recent aspiration. 3. Pneumoperitoneum, presumably related to recent laparotomy. Dilated loops of bowel in the visualized abdomen suggesting postoperative ileus. 4. Aortic atherosclerosis. 5. Cholelithiasis. These results will be called to the ordering clinician or representative by the Radiologist Assistant, and communication documented in the PACS or [employer]. Aortic Atherosclerosis (ICD10-I70.0). Electronically Signed   By: Vinnie Langton M.D.   On: 02/19/2021 10:51    Impression/Recommendations Active Problems:   Left renal mass  Left segmental PE. Chronic thrombocytosis. Patient may have a risk for PE due to thrombocytosis chronically.  PE is quite small, only involve segments of left  lung.  Patient be treated with Eliquis. This is a probably due to immobility in the hospital, plan to treated for 3 to 6 months.  Bilateral lower lobe aspiration pneumonia. Acute hypoxemic respiratory failure. CT chest showed bilateral lower lobe airspace disease, consistent with aspiration.  We will start Unasyn.  Monitor procalcitonin level. Continue oxygen treatment.  Renal cancer s/p nephrostomy.    This is a due to immobility while in the hospital,   Thank you for this consultation.  Our [facility] hospitalist team will follow the patient with you.   Time Spent: 62 minutes  Sharen Hones M.D. [facility] 02/19/2021, 2:28 PM

## 2021-02-19 NOTE — Progress Notes (Signed)
Asked to see patient for severe abdominal pain not controlled with her pain medication.  I had ordered labs and hematocrit slightly decreased from yesterday at 33.  Her vital signs were stable with a BP 103/56.  They are concerned about a bulge to the right of her incision.  She has not passed any flatus.  Exam: Abdomen soft.  There is slight bulging to the right incision which is soft, compressible and consistent with mild bowel distention.  No incisional drainage.  Impression: No concerns for hematoma or infection Exam with mild abdominal distention and her pain may be intestinal related  Plan: Encourage ambulation This is the first time I have seen Mariah Lang.  Thomas Hoff, PA who saw her yesterday will round on her this morning and will check to see if there has been any significant change in her exam from yesterday

## 2021-02-19 NOTE — Progress Notes (Signed)
Urology Inpatient Progress Note  Subjective: Patient had ongoing complaints of abdominal pain overnight. She is afebrile, VSS. WBC count down today, 8.7.  Hemoglobin down today, 10.5. Creatinine up today, 0.68. She has been ambulating on the unit. She is tolerating PO without nausea or vomiting. She is voiding spontaneously. She has still not passed flatus.  Anti-infectives: Anti-infectives (From admission, onward)    Start     Dose/Rate Route Frequency Ordered Stop   02/17/21 2100  ceFAZolin (ANCEF) IVPB 1 g/50 mL premix        1 g 100 mL/hr over 30 Minutes Intravenous Every 8 hours 02/17/21 1740 02/18/21 1040   02/17/21 0624  ceFAZolin (ANCEF) 2-4 GM/100ML-% IVPB       Note to Pharmacy: [name]  : cabinet override      02/17/21 0624 02/17/21 0927   02/17/21 0615  metroNIDAZOLE (FLAGYL) IVPB 500 mg        500 mg 100 mL/hr over 60 Minutes Intravenous  Once 02/17/21 0605 02/17/21 0828   02/17/21 0605  ceFAZolin (ANCEF) IVPB 2g/100 mL premix        2 g 200 mL/hr over 30 Minutes Intravenous 30 min pre-op 02/17/21 3474 02/17/21 1248       Current Facility-Administered Medications  Medication Dose Route Frequency Provider Last Rate Last Admin   0.9 %  sodium chloride infusion   Intravenous Continuous Hollice Espy, MD   Stopped at 02/18/21 1405   acetaminophen (TYLENOL) tablet 650 mg  650 mg Oral Q4H PRN Hollice Espy, MD   650 mg at 02/18/21 1136   diphenhydrAMINE (BENADRYL) injection 12.5 mg  12.5 mg Intravenous Q6H PRN Hollice Espy, MD       Or   diphenhydrAMINE (BENADRYL) 12.5 MG/5ML elixir 12.5 mg  12.5 mg Oral Q6H PRN Hollice Espy, MD       docusate sodium (COLACE) capsule 100 mg  100 mg Oral BID Hollice Espy, MD   100 mg at 02/18/21 2021   heparin injection 5,000 Units  5,000 Units Subcutaneous Q8H Hollice Espy, MD   5,000 Units at 02/19/21 0615   morphine 2 MG/ML injection 2-4 mg  2-4 mg Intravenous Q2H PRN Hollice Espy, MD   4 mg at 02/19/21 0614    ondansetron (ZOFRAN) injection 4 mg  4 mg Intravenous Q4H PRN Hollice Espy, MD       oxybutynin (DITROPAN) tablet 5 mg  5 mg Oral Q8H PRN Hollice Espy, MD       oxybutynin (DITROPAN-XL) 24 hr tablet 10 mg  10 mg Oral Daily Hollice Espy, MD   10 mg at 02/18/21 1138   oxyCODONE-acetaminophen (PERCOCET/ROXICET) 5-325 MG per tablet 1-2 tablet  1-2 tablet Oral Q4H PRN Hollice Espy, MD   2 tablet at 02/19/21 0756   Objective: Vital signs in last 24 hours: Temp:  [98.1 F (36.7 C)-98.8 F (37.1 C)] 98.7 F (37.1 C) (11/18 0318) Pulse Rate:  [73-101] 101 (11/18 0318) Resp:  [18-20] 20 (11/17 1954) BP: (95-132)/(56-75) 132/75 (11/18 0318) SpO2:  [85 %-95 %] 95 % (11/18 0600)  Intake/Output from previous day: 11/17 0701 - 11/18 0700 In: 2886.8 [P.O.:1880; I.V.:1006.8] Out: 1725 [Urine:1725] Intake/Output this shift: No intake/output data recorded.  Physical Exam Vitals and nursing note reviewed.  Constitutional:      Appearance: She is not ill-appearing, toxic-appearing or diaphoretic.     Comments: Uncomfortable appearing  Pulmonary:     Effort: Pulmonary effort is normal. No respiratory distress.  Abdominal:  Comments: Surgical incisions noted over the anterior abdomen, all clean, dry, and intact with overlying surgical adhesive.  There is mild bruising/erythema around her port sites, especially the hand port.  There has been no extension of discoloration since yesterday as the border of this has been marked.  No fluctuance, erythema, or purulent drainage.  Overall, abdomen remains soft without rigidity or guarding.  Skin:    General: Skin is warm and dry.  Neurological:     Mental Status: She is alert and oriented to person, place, and time.  Psychiatric:        Mood and Affect: Mood normal.        Behavior: Behavior normal.   Lab Results:  Recent Labs    02/18/21 0556 02/19/21 0425  WBC 12.4* 8.7  HGB 11.9* 10.5*  HCT 36.2 33.1*  PLT 363 323    BMET Recent Labs    02/18/21 0556 02/19/21 0425  NA 135 133*  K 3.8 3.5  CL 108 105  CO2 22 25  GLUCOSE 129* 104*  BUN 12 10  CREATININE 0.59 0.68  CALCIUM 7.8* 8.0*   Assessment & Plan: 50 year old female POD2 from hand-assisted laparoscopic left radical nephrectomy with Dr. Erlene Quan for management of a large left renal mass and robotic assisted total hysterectomy and bilateral salpingo-oophorectomy with Dr. Theora Gianotti for management of multiple uterine leiomyomas.  Continued postoperative changes noted on her a.m. labs.  No evidence of acute abdomen today.  She continues to report significant abdominal pain, however her exam, white count, and vitals are rather reassuring.  Will continue to monitor and keep a low threshold for imaging with oral contrast.  We discussed the need for ongoing ambulation and incentive spirometry, as I do think there could be an element of postoperative ileus contributing to her symptoms.  Possible discharge this afternoon depending on patient progress.  Debroah Loop, PA-C 02/19/2021

## 2021-02-19 NOTE — Progress Notes (Signed)
Daily Benign Gynecology Progress Note Mariah Lang  [id number]   Chief Complaint: Post-op care  POD#2  Subjective:  Overnight Events: no acute events Complaints: bad pain in her abdomen mostly to the right of her umbilicus where there is a slight bulge She denies: fevers, chills, chest pain, trouble breathing, nausea, vomiting.  She has tolerated: light diet without nausea or vomiting She reports her pain is not well controlled with pain medication because of her pain that comes and goes.  She was evaluated for this by Dr. Bernardo Heater yesterday.   She is ambulating and is voiding.    She has not passed flatus Objective:  Most recent vitals Temp: 98.7 F (37.1 C)  BP: 132/75  Pulse Rate: (!) 101  Resp: 20  SpO2: 95 %   Vitals Range over 24 hours Temp  Avg: 98.5 F (36.9 C)  Min: 98.1 F (36.7 C)  Max: 98.8 F (37.1 C) BP  Min: 95/57  Max: 132/75 Pulse  Avg: 84.4  Min: 73  Max: 101 SpO2  Avg: 92.2 %  Min: 85 %  Max: 95 %   Physical Exam General: alert, well appearing, and in mild distress Heart: regular rate and rhythm Lungs: clear to auscultation, no wheezes, rales or rhonchi, symmetric air entry Abdomen: s/mild ttp, scant bowel sounds/mild distension.  Incision: all clean/dry/intact, erythema around larger incision marked with pen, appears roughly within the borders of the marking.  Extremities: SCDs in place  AM Labs Lab Results  Component Value Date   WBC 8.7 02/19/2021   HGB 10.5 (L) 02/19/2021   HCT 33.1 (L) 02/19/2021   PLT 323 02/19/2021   NA 133 (L) 02/19/2021   K 3.5 02/19/2021   CREATININE 0.68 02/19/2021   BUN 10 02/19/2021   INR 1.0 02/09/2021     Assessment:  Mariah Lang is a 50 y.o. female POD#2 s/p robot assisted TLH/BSO/myomectomy, and left radical nephrectomy.    Plan:   Continue care per primary team.  Usual discharge indications. Consider imaging as indicated for abdominal pain. Her hemoglobin/hematocrit  has not dropped significantly since yesterday.  Her other lab values are essentially normal apart from a small elevation in one of her liver enzymes.  Consider limiting use of hepatotoxic medications.   Prentice Docker, MD  02/19/2021 7:35 AM

## 2021-02-19 NOTE — Progress Notes (Signed)
Patient complains of severe pain located in her mid-abdomen (umbilicus area). Patient's abdomen appears slightly bulged, red, and tender to touch. Medication given per order. MD has been notified of patient's pain and have been asked to come take a look at the patient for further assessment. MD states that he will come lay eyes on the patient shortly.

## 2021-02-19 NOTE — Progress Notes (Signed)
Patient became hypoxic this morning to 83% on room air. She was started on 5L supplemental O2 per nursing with saturation returning to 92%. STAT CT angio chest was ordered, which revealed right segmental and subsegmental PEs, primarily nonocclusive, bilateral dependent atelectasis, and dilated bowel loops consistent with postoperative ileus. Hospitalists have been consulted to start her on anticoagulation as she remains hemodynamically stable at this time.

## 2021-02-20 DIAGNOSIS — E871 Hypo-osmolality and hyponatremia: Secondary | ICD-10-CM

## 2021-02-20 LAB — CBC
HCT: 39.8 % (ref 36.0–46.0)
Hemoglobin: 12.5 g/dL (ref 12.0–15.0)
MCH: 25.8 pg — ABNORMAL LOW (ref 26.0–34.0)
MCHC: 31.4 g/dL (ref 30.0–36.0)
MCV: 82.1 fL (ref 80.0–100.0)
Platelets: 394 10*3/uL (ref 150–400)
RBC: 4.85 MIL/uL (ref 3.87–5.11)
RDW: 16.9 % — ABNORMAL HIGH (ref 11.5–15.5)
WBC: 12.2 10*3/uL — ABNORMAL HIGH (ref 4.0–10.5)
nRBC: 0 % (ref 0.0–0.2)

## 2021-02-20 LAB — BASIC METABOLIC PANEL
Anion gap: 8 (ref 5–15)
BUN: 8 mg/dL (ref 6–20)
CO2: 27 mmol/L (ref 22–32)
Calcium: 8.8 mg/dL — ABNORMAL LOW (ref 8.9–10.3)
Chloride: 97 mmol/L — ABNORMAL LOW (ref 98–111)
Creatinine, Ser: 0.75 mg/dL (ref 0.44–1.00)
GFR, Estimated: >60 mL/min (ref >60)
Glucose, Bld: 112 mg/dL — ABNORMAL HIGH (ref 70–99)
Potassium: 4 mmol/L (ref 3.5–5.1)
Sodium: 132 mmol/L — ABNORMAL LOW (ref 135–145)

## 2021-02-20 NOTE — Progress Notes (Signed)
PROGRESS NOTE    Mariah Lang  ZOX:[id number] DOB: 08-29-1970 DOA: 02/17/2021 PCP: Charlott Rakes, MD   Follow-up on PE. Brief Narrative:  Mariah Lang is an 50 y.o. female with history of iron deficient anemia, thrombocytosis, left renal mass who present to the hospital for elective left nephrostomy which was performed on 11/16.  Patient also had a total hysterectomy at the same time. Patient started having shortness of breath and hypoxemia, was placed on 3 L oxygen.  A CT angiogram chest was performed, showed bilateral lower lobe airspace disease, segmental right-sided PE.   Assessment & Plan:   Active Problems:   Left renal mass   Aspiration pneumonia of both lower lobes due to gastric secretions (HCC)   Pulmonary embolism (HCC)   Acute hypoxemic respiratory failure (HCC)  Left segmental PE. Chronic thrombocytosis. Continue anticoagulation for total course of 3 to 6 months, follow-up with PCP.   Bilateral lower lobe aspiration pneumonia. Acute hypoxemic respiratory failure. Patient has elevated procalcitonin level, he has bilateral bacterial pneumonia secondary to aspiration. Unasyn was started.  We will continue for today.  May change to oral Augmentin tomorrow for additional course of 5 days.  Renal cancer s/p nephrostomy.    DVT prophylaxis: Eliquis Code Status: full Family Communication: Daughter at bedside, served as Astronomer, also updated her. Disposition Plan:    Status is: Inpatient  Remains inpatient appropriate because: Severity of disease        I/O last 3 completed shifts: In: 1159.8 [P.O.:920; I.V.:39.8; IV Piggyback:200] Out: 0  No intake/output data recorded.     Consultants:    Procedures: Nephrostomy.  Antimicrobials: Unasyn.  Subjective: Patient condition much improved today, she is off oxygen, short of breath has improved.  No chest pain. She also had a large bowel movement today, she is  tolerating diet.  IV fluid discontinued. No fever chills No dysuria hematuria.  Objective: Vitals:   02/19/21 2218 02/20/21 0430 02/20/21 0436 02/20/21 0756  BP:   131/82 131/84  Pulse: 91  75 90  Resp:   20 16  Temp:   98.4 F (36.9 C) 99.3 F (37.4 C)  TempSrc:    Oral  SpO2: 95% 93% 97% 98%  Weight:      Height:        Intake/Output Summary (Last 24 hours) at 02/20/2021 1007 Last data filed at 02/20/2021 0534 Gross per 24 hour  Intake 599.8 ml  Output 0 ml  Net 599.8 ml   Filed Weights   02/17/21 0645  Weight: 84.8 kg    Examination:  General exam: Appears calm and comfortable  Respiratory system: Clear to auscultation. Respiratory effort normal. Cardiovascular system: S1 & S2 heard, RRR. No JVD, murmurs, rubs, gallops or clicks. No pedal edema. Gastrointestinal system: Abdomen is nondistended, soft and nontender. No organomegaly or masses felt. Normal bowel sounds heard. Central nervous system: Alert and oriented. No focal neurological deficits. Extremities: Symmetric 5 x 5 power. Skin: No rashes, lesions or ulcers Psychiatry: Judgement and insight appear normal. Mood & affect appropriate.     Data Reviewed: I have personally reviewed following labs and imaging studies  CBC: Recent Labs  Lab 02/18/21 0556 02/19/21 0425 02/20/21 0830  WBC 12.4* 8.7 12.2*  HGB 11.9* 10.5* 12.5  HCT 36.2 33.1* 39.8  MCV 80.4 82.5 82.1  PLT 363 323 811   Basic Metabolic Panel: Recent Labs  Lab 02/18/21 0556 02/19/21 0425 02/20/21 0830  NA 135 133* 132*  K 3.8  3.5 4.0  CL 108 105 97*  CO2 22 25 27   GLUCOSE 129* 104* 112*  BUN 12 10 8   CREATININE 0.59 0.68 0.75  CALCIUM 7.8* 8.0* 8.8*   GFR: Estimated Creatinine Clearance: 81.2 mL/min (by C-G formula based on SCr of 0.75 mg/dL). Liver Function Tests: Recent Labs  Lab 02/19/21 0425  AST 43*  ALT 34  ALKPHOS 57  BILITOT 0.8  PROT 6.4*  ALBUMIN 2.9*   No results for input(s): LIPASE, AMYLASE in the last  168 hours. No results for input(s): AMMONIA in the last 168 hours. Coagulation Profile: No results for input(s): INR, PROTIME in the last 168 hours. Cardiac Enzymes: No results for input(s): CKTOTAL, CKMB, CKMBINDEX, TROPONINI in the last 168 hours. BNP (last 3 results) No results for input(s): PROBNP in the last 8760 hours. HbA1C: No results for input(s): HGBA1C in the last 72 hours. CBG: Recent Labs  Lab 02/17/21 0639  GLUCAP 92   Lipid Profile: No results for input(s): CHOL, HDL, LDLCALC, TRIG, CHOLHDL, LDLDIRECT in the last 72 hours. Thyroid Function Tests: No results for input(s): TSH, T4TOTAL, FREET4, T3FREE, THYROIDAB in the last 72 hours. Anemia Panel: No results for input(s): VITAMINB12, FOLATE, FERRITIN, TIBC, IRON, RETICCTPCT in the last 72 hours. Sepsis Labs: Recent Labs  Lab 02/19/21 0425  PROCALCITON 0.90    Recent Results (from the past 240 hour(s))  SARS CORONAVIRUS 2 (TAT 6-24 HRS) Nasopharyngeal Nasopharyngeal Swab     Status: None   Collection Time: 02/15/21  8:45 AM   Specimen: Nasopharyngeal Swab  Result Value Ref Range Status   SARS Coronavirus 2 NEGATIVE NEGATIVE Final    Comment: (NOTE) SARS-CoV-2 target nucleic acids are NOT DETECTED.  The SARS-CoV-2 RNA is generally detectable in upper and lower respiratory specimens during the acute phase of infection. Negative results do not preclude SARS-CoV-2 infection, do not rule out co-infections with other pathogens, and should not be used as the sole basis for treatment or other patient management decisions. Negative results must be combined with clinical observations, patient history, and epidemiological information. The expected result is Negative.  Fact Sheet for Patients: SugarRoll.be  Fact Sheet for Healthcare Providers: https://www.woods-mathews.com/  This test is not yet approved or cleared by the [place] FDA and  has been authorized for  detection and/or diagnosis of SARS-CoV-2 by FDA under an Emergency Use Authorization (EUA). This EUA will remain  in effect (meaning this test can be used) for the duration of the COVID-19 declaration under Se ction 564(b)(1) of the Act, 21 U.S.C. section 360bbb-3(b)(1), unless the authorization is terminated or revoked sooner.  Performed at [facility], [address] [place], [place] [address]          Radiology Studies: CT Angio Chest Pulmonary Embolism (PE) W or WO Contrast  Result Date: 02/19/2021 CLINICAL DATA:  50 year old female status post left nephrectomy and hysterectomy on 02/17/2021. Shortness of breath. EXAM: CT ANGIOGRAPHY CHEST WITH CONTRAST TECHNIQUE: Multidetector CT imaging of the chest was performed using the standard protocol during bolus administration of intravenous contrast. Multiplanar CT image reconstructions and MIPs were obtained to evaluate the vascular anatomy. CONTRAST:  39mL OMNIPAQUE IOHEXOL 350 MG/ML SOLN COMPARISON:  Chest CT 12/18/2020. FINDINGS: Cardiovascular: There are several filling defects within the pulmonary arterial tree of the right lung, compatible with pulmonary embolism. These involve segmental and subsegmental sized vessels and appear predominantly nonocclusive at this time. Heart size is mildly enlarged. There is no significant pericardial fluid, thickening or pericardial  calcification. Aortic atherosclerosis. No definite coronary artery calcifications. Mediastinum/Nodes: No pathologically enlarged mediastinal or hilar lymph nodes. Esophagus is unremarkable in appearance. No axillary lymphadenopathy. Lungs/Pleura: Extensive areas of atelectasis and airspace consolidation are noted in the dependent portions of the lungs bilaterally, most severe in the lower lobes, likely reflective of recent aspiration. No pneumothorax. No pleural effusions. Upper Abdomen: Large amount of pneumoperitoneum noted beneath the right hemidiaphragm. Multiple  dilated loops of bowel in the visualized portions of the abdomen, suggesting a postoperative ileus. Musculoskeletal: There are no aggressive appearing lytic or blastic lesions noted in the visualized portions of the skeleton. Review of the MIP images confirms the above findings. IMPRESSION: 1. Study is positive for segmental and subsegmental sized pulmonary embolism in the right lung, which appears predominantly nonocclusive at this time. 2. Extensive dependent areas of atelectasis and airspace consolidation in the lungs bilaterally, suggesting sequela of recent aspiration. 3. Pneumoperitoneum, presumably related to recent laparotomy. Dilated loops of bowel in the visualized abdomen suggesting postoperative ileus. 4. Aortic atherosclerosis. 5. Cholelithiasis. These results will be called to the ordering clinician or representative by the Radiologist Assistant, and communication documented in the PACS or [employer]. Aortic Atherosclerosis (ICD10-I70.0). Electronically Signed   By: Vinnie Langton M.D.   On: 02/19/2021 10:51        Scheduled Meds:  apixaban  10 mg Oral BID   Followed by   Derrill Memo ON 02/26/2021] apixaban  5 mg Oral BID   docusate sodium  100 mg Oral BID   Continuous Infusions:  sodium chloride 10 mL/hr at 02/20/21 0428   ampicillin-sulbactam (UNASYN) IV 3 g (02/20/21 0429)     LOS: 3 days    Time spent: 27 minutes    Sharen Hones, MD [facility]   To contact the attending provider between 7A-7P or the covering provider during after hours 7P-7A, please log into the web site [email or web address] and access using universal [place] password for that web site. If you do not have the password, please call the hospital operator.  02/20/2021, 10:07 AM

## 2021-02-20 NOTE — Progress Notes (Signed)
3 Days Post-Op  Subjective:  Mariah Lang is 3 days out from a left HALRN for a 10cm renal mass and also a hysterectomy.  She has a right segmental PE and has been started on Eliquis.  She has less SOB and is being weaned off of O2.   She has had a postop ileus and had severe crampy pain this AM that improved after walking followed by a BM.  She is eating a little bit.  She is currently on oxybutynin but wasn't prior to admission.   ROS:  Review of Systems  Constitutional:  Negative for chills and fever.  Gastrointestinal:  Positive for abdominal pain. Negative for nausea.   Anti-infectives: Anti-infectives (From admission, onward)    Start     Dose/Rate Route Frequency Ordered Stop   02/19/21 1700  Ampicillin-Sulbactam (UNASYN) 3 g in sodium chloride 0.9 % 100 mL IVPB        3 g 200 mL/hr over 30 Minutes Intravenous Every 6 hours 02/19/21 1603     02/17/21 2100  ceFAZolin (ANCEF) IVPB 1 g/50 mL premix        1 g 100 mL/hr over 30 Minutes Intravenous Every 8 hours 02/17/21 1740 02/18/21 1040   02/17/21 0624  ceFAZolin (ANCEF) 2-4 GM/100ML-% IVPB       Note to Pharmacy: [name]  : cabinet override      02/17/21 0624 02/17/21 0927   02/17/21 0615  metroNIDAZOLE (FLAGYL) IVPB 500 mg        500 mg 100 mL/hr over 60 Minutes Intravenous  Once 02/17/21 0605 02/17/21 0828   02/17/21 0605  ceFAZolin (ANCEF) IVPB 2g/100 mL premix        2 g 200 mL/hr over 30 Minutes Intravenous 30 min pre-op 02/17/21 5462 02/17/21 1248       Current Facility-Administered Medications  Medication Dose Route Frequency Provider Last Rate Last Admin   0.9 %  sodium chloride infusion   Intravenous Continuous Hollice Espy, MD   Stopped at 02/18/21 1405   0.9 %  sodium chloride infusion   Intravenous PRN Sharen Hones, MD 10 mL/hr at 02/20/21 0428 New Bag at 02/20/21 0428   acetaminophen (TYLENOL) tablet 650 mg  650 mg Oral Q4H PRN Hollice Espy, MD   650 mg at 02/18/21 1136   Ampicillin-Sulbactam  (UNASYN) 3 g in sodium chloride 0.9 % 100 mL IVPB  3 g Intravenous Q6H Sharen Hones, MD 200 mL/hr at 02/20/21 0429 3 g at 02/20/21 0429   apixaban (ELIQUIS) tablet 10 mg  10 mg Oral BID Sharen Hones, MD   10 mg at 02/19/21 2038   Followed by   Derrill Memo ON 02/26/2021] apixaban (ELIQUIS) tablet 5 mg  5 mg Oral BID Sharen Hones, MD       diphenhydrAMINE (BENADRYL) injection 12.5 mg  12.5 mg Intravenous Q6H PRN Hollice Espy, MD       Or   diphenhydrAMINE (BENADRYL) 12.5 MG/5ML elixir 12.5 mg  12.5 mg Oral Q6H PRN Hollice Espy, MD       docusate sodium (COLACE) capsule 100 mg  100 mg Oral BID Hollice Espy, MD   100 mg at 02/19/21 2039   morphine 2 MG/ML injection 2-4 mg  2-4 mg Intravenous Q2H PRN Hollice Espy, MD   2 mg at 02/20/21 0737   ondansetron (ZOFRAN) injection 4 mg  4 mg Intravenous Q4H PRN Hollice Espy, MD       oxybutynin (DITROPAN) tablet 5 mg  5 mg Oral Q8H PRN  Hollice Espy, MD       oxybutynin (DITROPAN-XL) 24 hr tablet 10 mg  10 mg Oral Daily Hollice Espy, MD   10 mg at 02/19/21 1128   oxyCODONE-acetaminophen (PERCOCET/ROXICET) 5-325 MG per tablet 1-2 tablet  1-2 tablet Oral Q4H PRN Hollice Espy, MD   2 tablet at 02/19/21 2210     Objective: Vital signs in last 24 hours: Temp:  [98.2 F (36.8 C)-99.3 F (37.4 C)] 99.3 F (37.4 C) (11/19 0756) Pulse Rate:  [75-115] 90 (11/19 0756) Resp:  [16-20] 16 (11/19 0756) BP: (104-131)/(66-84) 131/84 (11/19 0756) SpO2:  [83 %-98 %] 98 % (11/19 0756)  Intake/Output from previous day: 11/18 0701 - 11/19 0700 In: 599.8 [P.O.:360; I.V.:39.8; IV Piggyback:200] Out: 0  Intake/Output this shift: No intake/output data recorded.   Physical Exam Vitals reviewed.  Constitutional:      Appearance: Normal appearance. She is obese.  Cardiovascular:     Rate and Rhythm: Normal rate and regular rhythm.  Pulmonary:     Effort: Pulmonary effort is normal. No respiratory distress.     Breath sounds: Normal breath sounds.   Abdominal:     General: There is distension.     Palpations: Abdomen is soft.     Tenderness: There is no abdominal tenderness.     Comments: Incisions intact without erythema. BS quite.  Musculoskeletal:        General: No swelling or tenderness. Normal range of motion.  Skin:    General: Skin is warm and dry.  Neurological:     General: No focal deficit present.     Mental Status: She is alert and oriented to person, place, and time.  Psychiatric:        Mood and Affect: Mood normal.        Behavior: Behavior normal.    Lab Results:  Recent Labs    02/19/21 0425 02/20/21 0830  WBC 8.7 12.2*  HGB 10.5* 12.5  HCT 33.1* 39.8  PLT 323 394   BMET Recent Labs    02/19/21 0425 02/20/21 0830  NA 133* 132*  K 3.5 4.0  CL 105 97*  CO2 25 27  GLUCOSE 104* 112*  BUN 10 8  CREATININE 0.68 0.75  CALCIUM 8.0* 8.8*   PT/INR No results for input(s): LABPROT, INR in the last 72 hours. ABG No results for input(s): PHART, HCO3 in the last 72 hours.  Invalid input(s): PCO2, PO2  Studies/Results: CT Angio Chest Pulmonary Embolism (PE) W or WO Contrast  Result Date: 02/19/2021 CLINICAL DATA:  50 year old female status post left nephrectomy and hysterectomy on 02/17/2021. Shortness of breath. EXAM: CT ANGIOGRAPHY CHEST WITH CONTRAST TECHNIQUE: Multidetector CT imaging of the chest was performed using the standard protocol during bolus administration of intravenous contrast. Multiplanar CT image reconstructions and MIPs were obtained to evaluate the vascular anatomy. CONTRAST:  61mL OMNIPAQUE IOHEXOL 350 MG/ML SOLN COMPARISON:  Chest CT 12/18/2020. FINDINGS: Cardiovascular: There are several filling defects within the pulmonary arterial tree of the right lung, compatible with pulmonary embolism. These involve segmental and subsegmental sized vessels and appear predominantly nonocclusive at this time. Heart size is mildly enlarged. There is no significant pericardial fluid, thickening  or pericardial calcification. Aortic atherosclerosis. No definite coronary artery calcifications. Mediastinum/Nodes: No pathologically enlarged mediastinal or hilar lymph nodes. Esophagus is unremarkable in appearance. No axillary lymphadenopathy. Lungs/Pleura: Extensive areas of atelectasis and airspace consolidation are noted in the dependent portions of the lungs bilaterally, most severe in the lower lobes, likely  reflective of recent aspiration. No pneumothorax. No pleural effusions. Upper Abdomen: Large amount of pneumoperitoneum noted beneath the right hemidiaphragm. Multiple dilated loops of bowel in the visualized portions of the abdomen, suggesting a postoperative ileus. Musculoskeletal: There are no aggressive appearing lytic or blastic lesions noted in the visualized portions of the skeleton. Review of the MIP images confirms the above findings. IMPRESSION: 1. Study is positive for segmental and subsegmental sized pulmonary embolism in the right lung, which appears predominantly nonocclusive at this time. 2. Extensive dependent areas of atelectasis and airspace consolidation in the lungs bilaterally, suggesting sequela of recent aspiration. 3. Pneumoperitoneum, presumably related to recent laparotomy. Dilated loops of bowel in the visualized abdomen suggesting postoperative ileus. 4. Aortic atherosclerosis. 5. Cholelithiasis. These results will be called to the ordering clinician or representative by the Radiologist Assistant, and communication documented in the PACS or [employer]. Aortic Atherosclerosis (ICD10-I70.0). Electronically Signed   By: Vinnie Langton M.D.   On: 02/19/2021 10:51     Assessment and Plan: 10cm left renal mass, path pending.  S/p hysterectomy. Post op ileus.  Good BM this AM.  Still somewhat distended with some bowel sounds.   Post op PE.  She is on Eliquis.  She has no SOB or tachypnea this AP.     I think she needs another 24 hrs to insure that she is tolerating  a diet and no longer needs supplemental O2. I will stop the oxybutynin because of the ileus.  Reassess in AM    LOS: 3 days    Irine Seal 02/20/2021 [phone] Patient ID: Mariah Lang, female   DOB: Sep 19, 1970, 50 y.o.   MRN: [id number]

## 2021-02-21 LAB — CBC WITH DIFFERENTIAL/PLATELET
Abs Immature Granulocytes: 0.06 10*3/uL (ref 0.00–0.07)
Basophils Absolute: 0.1 10*3/uL (ref 0.0–0.1)
Basophils Relative: 1 %
Eosinophils Absolute: 0.3 10*3/uL (ref 0.0–0.5)
Eosinophils Relative: 4 %
HCT: 36.3 % (ref 36.0–46.0)
Hemoglobin: 11.5 g/dL — ABNORMAL LOW (ref 12.0–15.0)
Immature Granulocytes: 1 %
Lymphocytes Relative: 27 %
Lymphs Abs: 2 10*3/uL (ref 0.7–4.0)
MCH: 26 pg (ref 26.0–34.0)
MCHC: 31.7 g/dL (ref 30.0–36.0)
MCV: 81.9 fL (ref 80.0–100.0)
Monocytes Absolute: 0.5 10*3/uL (ref 0.1–1.0)
Monocytes Relative: 7 %
Neutro Abs: 4.6 10*3/uL (ref 1.7–7.7)
Neutrophils Relative %: 60 %
Platelets: 401 10*3/uL — ABNORMAL HIGH (ref 150–400)
RBC: 4.43 MIL/uL (ref 3.87–5.11)
RDW: 16.7 % — ABNORMAL HIGH (ref 11.5–15.5)
WBC: 7.5 10*3/uL (ref 4.0–10.5)
nRBC: 0 % (ref 0.0–0.2)

## 2021-02-21 LAB — BASIC METABOLIC PANEL
Anion gap: 8 (ref 5–15)
BUN: 9 mg/dL (ref 6–20)
CO2: 29 mmol/L (ref 22–32)
Calcium: 8.7 mg/dL — ABNORMAL LOW (ref 8.9–10.3)
Chloride: 99 mmol/L (ref 98–111)
Creatinine, Ser: 0.72 mg/dL (ref 0.44–1.00)
GFR, Estimated: >60 mL/min (ref >60)
Glucose, Bld: 139 mg/dL — ABNORMAL HIGH (ref 70–99)
Potassium: 4.2 mmol/L (ref 3.5–5.1)
Sodium: 136 mmol/L (ref 135–145)

## 2021-02-21 LAB — MAGNESIUM: Magnesium: 2.3 mg/dL (ref 1.7–2.4)

## 2021-02-21 MED ORDER — AMOXICILLIN-POT CLAVULANATE 875-125 MG PO TABS: 1.0000 | ORAL_TABLET | Freq: Two times a day (BID) | ORAL | 0 refills | Status: DC

## 2021-02-21 MED ORDER — HYDROCODONE-ACETAMINOPHEN 5-325 MG PO TABS: 2.0000 | ORAL_TABLET | Freq: Four times a day (QID) | ORAL | 0 refills | Status: DC | PRN

## 2021-02-21 MED ORDER — APIXABAN 5 MG PO TABS: 5.0000 mg | ORAL_TABLET | Freq: Two times a day (BID) | ORAL | 0 refills | Status: DC

## 2021-02-21 MED ORDER — APIXABAN 5 MG PO TABS: 10.0000 mg | ORAL_TABLET | Freq: Two times a day (BID) | ORAL | 0 refills | Status: DC

## 2021-02-21 NOTE — Discharge Summary (Signed)
Physician Discharge Summary  Patient ID: Mariah Lang MRN: [id number] DOB/AGE: 09-02-1970 50 y.o.  Admit date: 02/17/2021 Discharge date: 02/21/2021  Admission Diagnoses:  Left renal mass  Discharge Diagnoses:  Principal Problem:   Left renal mass Active Problems:   Iron deficiency anemia   Thrombocytosis   Aspiration pneumonia of both lower lobes due to gastric secretions (HCC)   Pulmonary embolism (HCC)   Acute hypoxemic respiratory failure (HCC)   Hyponatremia   Past Medical History:  Diagnosis Date   Anemia     Surgeries: Procedure(s): LAPAROTOMY OR HAND ASSIST XI ROBOTIC ASSISTED LAPAROSCOPIC HYSTERECTOMY AND SALPINGECTOMY HAND ASSISTED LAPAROSCOPIC RADICAL  NEPHRECTOMY on 02/17/2021   Consultants (if any): Treatment Team:  Will Bonnet, MD Sharen Hones, MD  Discharged Condition: Improved  Hospital Course: Mariah Lang Mariah Lang is an 50 y.o. female who was admitted 02/17/2021 with a diagnosis of Left renal mass and and uterine fibroids and went to the operating room on 02/17/2021 and underwent the above named procedures. Her post op course was complicated by right segmental pulmonary emboli, aspiration pneumonia, post op ileus and mild acute blood loss anemia.  She has recovered well from those complications and will be discharged home on Augmentin and Eliquis.     She was given perioperative antibiotics:  Anti-infectives (From admission, onward)    Start     Dose/Rate Route Frequency Ordered Stop   02/21/21 0000  amoxicillin-clavulanate (AUGMENTIN) 875-125 MG tablet        1 tablet Oral 2 times daily 02/21/21 1019 02/26/21 2359   02/19/21 1700  Ampicillin-Sulbactam (UNASYN) 3 g in sodium chloride 0.9 % 100 mL IVPB        3 g 200 mL/hr over 30 Minutes Intravenous Every 6 hours 02/19/21 1603     02/17/21 2100  ceFAZolin (ANCEF) IVPB 1 g/50 mL premix        1 g 100 mL/hr over 30 Minutes Intravenous Every 8 hours 02/17/21 1740  02/18/21 1040   02/17/21 0624  ceFAZolin (ANCEF) 2-4 GM/100ML-% IVPB       Note to Pharmacy: Mariah Lang  : cabinet override      02/17/21 0624 02/17/21 0927   02/17/21 0615  metroNIDAZOLE (FLAGYL) IVPB 500 mg        500 mg 100 mL/hr over 60 Minutes Intravenous  Once 02/17/21 0605 02/17/21 0828   02/17/21 0605  ceFAZolin (ANCEF) IVPB 2g/100 mL premix        2 g 200 mL/hr over 30 Minutes Intravenous 30 min pre-op 02/17/21 0605 02/17/21 1248     .  She was given sequential compression devices for DVT prophylaxis and was given heparin followed by Eliquis for the DVT.Marland Kitchen  She benefited maximally from the hospital stay and has recovered well from the PE, Aspriation pneumonia, ileus and mild acute blood loss anemia.   Recent vital signs:  Vitals:   02/21/21 0423 02/21/21 0807  BP: 135/69 123/68  Pulse: 75 72  Resp: 20 18  Temp: 97.6 F (36.4 C) 98.6 F (37 C)  SpO2: 95% 92%   Gen: WD, WN in NAD. Lungs: CTA, CV: RRR GI: soft, obese, NT with +BS.  Incisions intact.   Recent laboratory studies:  Lab Results  Component Value Date   HGB 11.5 (L) 02/21/2021   HGB 12.5 02/20/2021   HGB 10.5 (L) 02/19/2021   Lab Results  Component Value Date   WBC 7.5 02/21/2021   PLT 401 (H) 02/21/2021   Lab Results  Component Value Date   INR 1.0 02/09/2021   Lab Results  Component Value Date   NA 136 02/21/2021   K 4.2 02/21/2021   CL 99 02/21/2021   CO2 29 02/21/2021   BUN 9 02/21/2021   CREATININE 0.72 02/21/2021   GLUCOSE 139 (H) 02/21/2021    Discharge Medications:   Allergies as of 02/21/2021   No Known Allergies      Medication List     STOP taking these medications    FISH OIL OMEGA-3 PO   ibuprofen 200 MG tablet Commonly known as: ADVIL   meloxicam 7.5 MG tablet Commonly known as: MOBIC   Vitamin D (Ergocalciferol) 1.25 MG (50000 UNIT) Caps capsule Commonly known as: DRISDOL       TAKE these medications    amoxicillin-clavulanate 875-125 MG  tablet Commonly known as: Augmentin Take 1 tablet by mouth 2 (two) times daily for 5 days.   apixaban 5 MG Tabs tablet Commonly known as: ELIQUIS Take 2 tablets (10 mg total) by mouth 2 (two) times daily for 5 days.   apixaban 5 MG Tabs tablet Commonly known as: ELIQUIS Take 1 tablet (5 mg total) by mouth 2 (two) times daily. Start taking on: February 26, 2021   HYDROcodone-acetaminophen 5-325 MG tablet Commonly known as: NORCO/VICODIN Take 2 tablets by mouth every 6 (six) hours as needed for moderate pain.        Diagnostic Studies: CT Angio Chest Pulmonary Embolism (PE) W or WO Contrast  Result Date: 02/19/2021 CLINICAL DATA:  50 year old female status post left nephrectomy and hysterectomy on 02/17/2021. Shortness of breath. EXAM: CT ANGIOGRAPHY CHEST WITH CONTRAST TECHNIQUE: Multidetector CT imaging of the chest was performed using the standard protocol during bolus administration of intravenous contrast. Multiplanar CT image reconstructions and MIPs were obtained to evaluate the vascular anatomy. CONTRAST:  21mL OMNIPAQUE IOHEXOL 350 MG/ML SOLN COMPARISON:  Chest CT 12/18/2020. FINDINGS: Cardiovascular: There are several filling defects within the pulmonary arterial tree of the right lung, compatible with pulmonary embolism. These involve segmental and subsegmental sized vessels and appear predominantly nonocclusive at this time. Heart size is mildly enlarged. There is no significant pericardial fluid, thickening or pericardial calcification. Aortic atherosclerosis. No definite coronary artery calcifications. Mediastinum/Nodes: No pathologically enlarged mediastinal or hilar lymph nodes. Esophagus is unremarkable in appearance. No axillary lymphadenopathy. Lungs/Pleura: Extensive areas of atelectasis and airspace consolidation are noted in the dependent portions of the lungs bilaterally, most severe in the lower lobes, likely reflective of recent aspiration. No pneumothorax. No pleural  effusions. Upper Abdomen: Large amount of pneumoperitoneum noted beneath the right hemidiaphragm. Multiple dilated loops of bowel in the visualized portions of the abdomen, suggesting a postoperative ileus. Musculoskeletal: There are no aggressive appearing lytic or blastic lesions noted in the visualized portions of the skeleton. Review of the MIP images confirms the above findings. IMPRESSION: 1. Study is positive for segmental and subsegmental sized pulmonary embolism in the right lung, which appears predominantly nonocclusive at this time. 2. Extensive dependent areas of atelectasis and airspace consolidation in the lungs bilaterally, suggesting sequela of recent aspiration. 3. Pneumoperitoneum, presumably related to recent laparotomy. Dilated loops of bowel in the visualized abdomen suggesting postoperative ileus. 4. Aortic atherosclerosis. 5. Cholelithiasis. These results will be called to the ordering clinician or representative by the Radiologist Assistant, and communication documented in the PACS or [employer]. Aortic Atherosclerosis (ICD10-I70.0). Electronically Signed   By: Vinnie Langton M.D.   On: 02/19/2021 10:51    Disposition: Discharge disposition:  01-Home or Self Care      Discharge Instructions     Discharge patient   Complete by: As directed    Discharge disposition: 01-Home or Self Care   Discharge patient date: 02/21/2021   Discontinue IV   Complete by: As directed         Follow-up Information     Hollice Espy, MD Follow up.   Specialty: Urology Why: The office will call to arrange follow up for you in the next 2-3 weeks. Contact information: [address] [address] [phone]                  Signed: Irine Seal 02/21/2021, 10:33 AM

## 2021-02-21 NOTE — Plan of Care (Signed)
  Problem: Clinical Measurements: Goal: Ability to maintain clinical measurements within normal limits will improve Outcome: Adequate for Discharge Goal: Postoperative complications will be avoided or minimized Outcome: Adequate for Discharge   Problem: Skin Integrity: Goal: Demonstration of wound healing without infection will improve Outcome: Adequate for Discharge   Problem: Clinical Measurements: Goal: Ability to maintain clinical measurements within normal limits will improve Outcome: Adequate for Discharge Goal: Will remain free from infection Outcome: Adequate for Discharge Goal: Diagnostic test results will improve Outcome: Adequate for Discharge Goal: Respiratory complications will improve Outcome: Adequate for Discharge Goal: Cardiovascular complication will be avoided Outcome: Adequate for Discharge   Problem: Activity: Goal: Risk for activity intolerance will decrease Outcome: Adequate for Discharge   Problem: Nutrition: Goal: Adequate nutrition will be maintained Outcome: Adequate for Discharge   Problem: Coping: Goal: Level of anxiety will decrease Outcome: Adequate for Discharge   Problem: Elimination: Goal: Will not experience complications related to bowel motility Outcome: Adequate for Discharge Goal: Will not experience complications related to urinary retention Outcome: Adequate for Discharge   Problem: Pain Managment: Goal: General experience of comfort will improve Outcome: Adequate for Discharge   Problem: Safety: Goal: Ability to remain free from injury will improve Outcome: Adequate for Discharge   Problem: Skin Integrity: Goal: Risk for impaired skin integrity will decrease Outcome: Adequate for Discharge

## 2021-02-21 NOTE — Progress Notes (Signed)
PROGRESS NOTE    Mariah Lang  VQM:[id number] DOB: 1970/09/17 DOA: 02/17/2021 PCP: Charlott Rakes, MD    Brief Narrative:  Mariah Lang Mariah Lang is an 49 y.o. female with history of iron deficient anemia, thrombocytosis, left renal mass who present to the hospital for elective left nephrostomy which was performed on 11/16.  Patient also had a total hysterectomy at the same time. Patient started having shortness of breath and hypoxemia, was placed on 3 L oxygen.  A CT angiogram chest was performed, showed bilateral lower lobe airspace disease, segmental right-sided PE.   Assessment & Plan:   Principal Problem:   Left renal mass Active Problems:   Iron deficiency anemia   Thrombocytosis   Aspiration pneumonia of both lower lobes due to gastric secretions (HCC)   Pulmonary embolism (HCC)   Acute hypoxemic respiratory failure (HCC)   Hyponatremia  Left segmental PE. Chronic thrombocytosis. Continue anticoagulation for total course of 3 to 6 months, follow-up with PCP. Prescription sent to the pharmacy.   Bilateral lower lobe aspiration pneumonia. Acute hypoxemic respiratory failure. Patient has elevated procalcitonin level, he has bilateral bacterial pneumonia secondary to aspiration. Unasyn was started.  Will change to Augmentin for 5 days after discharge.  Prescription sent to the pharmacy.  Renal cancer s/p nephrostomy.    DVT prophylaxis: Eliquis Code Status: full Family Communication:  Disposition Plan:    Status is: Inpatient        I/O last 3 completed shifts: In: 737.5 [P.O.:600; I.V.:37.5; IV Piggyback:100] Out: 0  No intake/output data recorded.      Subjective: Patient doing well today.  She had 2 additional bowel movements last night, no nausea vomiting.  Tolerated diet well. No short of breath or cough. No fever or chills.  Objective: Vitals:   02/20/21 1654 02/20/21 1917 02/21/21 0423 02/21/21 0807  BP:  126/84 126/72 135/69 123/68  Pulse: 92 86 75 72  Resp: 20 16 20 18   Temp: 98.3 F (36.8 C) 99.1 F (37.3 C) 97.6 F (36.4 C) 98.6 F (37 C)  TempSrc: Oral Oral Oral Oral  SpO2: 95% 94% 95% 92%  Weight:      Height:        Intake/Output Summary (Last 24 hours) at 02/21/2021 1019 Last data filed at 02/21/2021 0430 Gross per 24 hour  Intake 600 ml  Output 0 ml  Net 600 ml   Filed Weights   02/17/21 0645  Weight: 84.8 kg    Examination:  General exam: Appears calm and comfortable  Respiratory system: Clear to auscultation. Respiratory effort normal. Cardiovascular system: S1 & S2 heard, RRR. No JVD, murmurs, rubs, gallops or clicks. No pedal edema. Gastrointestinal system: Abdomen is nondistended, soft and nontender. No organomegaly or masses felt. Normal bowel sounds heard. Central nervous system: Alert and oriented. No focal neurological deficits. Extremities: Symmetric 5 x 5 power. Skin: No rashes, lesions or ulcers Psychiatry: Judgement and insight appear normal. Mood & affect appropriate.     Data Reviewed: I have personally reviewed following labs and imaging studies  CBC: Recent Labs  Lab 02/18/21 0556 02/19/21 0425 02/20/21 0830 02/21/21 0508  WBC 12.4* 8.7 12.2* 7.5  NEUTROABS  --   --   --  4.6  HGB 11.9* 10.5* 12.5 11.5*  HCT 36.2 33.1* 39.8 36.3  MCV 80.4 82.5 82.1 81.9  PLT 363 323 394 932*   Basic Metabolic Panel: Recent Labs  Lab 02/18/21 0556 02/19/21 0425 02/20/21 0830 02/21/21 0508  NA 135  133* 132* 136  K 3.8 3.5 4.0 4.2  CL 108 105 97* 99  CO2 22 25 27 29   GLUCOSE 129* 104* 112* 139*  BUN 12 10 8 9   CREATININE 0.59 0.68 0.75 0.72  CALCIUM 7.8* 8.0* 8.8* 8.7*  MG  --   --   --  2.3   GFR: Estimated Creatinine Clearance: 81.2 mL/min (by C-G formula based on SCr of 0.72 mg/dL). Liver Function Tests: Recent Labs  Lab 02/19/21 0425  AST 43*  ALT 34  ALKPHOS 57  BILITOT 0.8  PROT 6.4*  ALBUMIN 2.9*   No results for  input(s): LIPASE, AMYLASE in the last 168 hours. No results for input(s): AMMONIA in the last 168 hours. Coagulation Profile: No results for input(s): INR, PROTIME in the last 168 hours. Cardiac Enzymes: No results for input(s): CKTOTAL, CKMB, CKMBINDEX, TROPONINI in the last 168 hours. BNP (last 3 results) No results for input(s): PROBNP in the last 8760 hours. HbA1C: No results for input(s): HGBA1C in the last 72 hours. CBG: Recent Labs  Lab 02/17/21 0639  GLUCAP 92   Lipid Profile: No results for input(s): CHOL, HDL, LDLCALC, TRIG, CHOLHDL, LDLDIRECT in the last 72 hours. Thyroid Function Tests: No results for input(s): TSH, T4TOTAL, FREET4, T3FREE, THYROIDAB in the last 72 hours. Anemia Panel: No results for input(s): VITAMINB12, FOLATE, FERRITIN, TIBC, IRON, RETICCTPCT in the last 72 hours. Sepsis Labs: Recent Labs  Lab 02/19/21 0425  PROCALCITON 0.90    Recent Results (from the past 240 hour(s))  SARS CORONAVIRUS 2 (TAT 6-24 HRS) Nasopharyngeal Nasopharyngeal Swab     Status: None   Collection Time: 02/15/21  8:45 AM   Specimen: Nasopharyngeal Swab  Result Value Ref Range Status   SARS Coronavirus 2 NEGATIVE NEGATIVE Final    Comment: (NOTE) SARS-CoV-2 target nucleic acids are NOT DETECTED.  The SARS-CoV-2 RNA is generally detectable in upper and lower respiratory specimens during the acute phase of infection. Negative results do not preclude SARS-CoV-2 infection, do not rule out co-infections with other pathogens, and should not be used as the sole basis for treatment or other patient management decisions. Negative results must be combined with clinical observations, patient history, and epidemiological information. The expected result is Negative.  Fact Sheet for Patients: SugarRoll.be  Fact Sheet for Healthcare Providers: https://www.woods-mathews.com/  This test is not yet approved or cleared by the [place]  FDA and  has been authorized for detection and/or diagnosis of SARS-CoV-2 by FDA under an Emergency Use Authorization (EUA). This EUA will remain  in effect (meaning this test can be used) for the duration of the COVID-19 declaration under Se ction 564(b)(1) of the Act, 21 U.S.C. section 360bbb-3(b)(1), unless the authorization is terminated or revoked sooner.  Performed at [facility], [place] [address] [place], [place] [address]          Radiology Studies: CT Angio Chest Pulmonary Embolism (PE) W or WO Contrast  Result Date: 02/19/2021 CLINICAL DATA:  50 year old female status post left nephrectomy and hysterectomy on 02/17/2021. Shortness of breath. EXAM: CT ANGIOGRAPHY CHEST WITH CONTRAST TECHNIQUE: Multidetector CT imaging of the chest was performed using the standard protocol during bolus administration of intravenous contrast. Multiplanar CT image reconstructions and MIPs were obtained to evaluate the vascular anatomy. CONTRAST:  96mL OMNIPAQUE IOHEXOL 350 MG/ML SOLN COMPARISON:  Chest CT 12/18/2020. FINDINGS: Cardiovascular: There are several filling defects within the pulmonary arterial tree of the right lung, compatible with pulmonary embolism. These involve segmental  and subsegmental sized vessels and appear predominantly nonocclusive at this time. Heart size is mildly enlarged. There is no significant pericardial fluid, thickening or pericardial calcification. Aortic atherosclerosis. No definite coronary artery calcifications. Mediastinum/Nodes: No pathologically enlarged mediastinal or hilar lymph nodes. Esophagus is unremarkable in appearance. No axillary lymphadenopathy. Lungs/Pleura: Extensive areas of atelectasis and airspace consolidation are noted in the dependent portions of the lungs bilaterally, most severe in the lower lobes, likely reflective of recent aspiration. No pneumothorax. No pleural effusions. Upper Abdomen: Large amount of pneumoperitoneum noted beneath the  right hemidiaphragm. Multiple dilated loops of bowel in the visualized portions of the abdomen, suggesting a postoperative ileus. Musculoskeletal: There are no aggressive appearing lytic or blastic lesions noted in the visualized portions of the skeleton. Review of the MIP images confirms the above findings. IMPRESSION: 1. Study is positive for segmental and subsegmental sized pulmonary embolism in the right lung, which appears predominantly nonocclusive at this time. 2. Extensive dependent areas of atelectasis and airspace consolidation in the lungs bilaterally, suggesting sequela of recent aspiration. 3. Pneumoperitoneum, presumably related to recent laparotomy. Dilated loops of bowel in the visualized abdomen suggesting postoperative ileus. 4. Aortic atherosclerosis. 5. Cholelithiasis. These results will be called to the ordering clinician or representative by the Radiologist Assistant, and communication documented in the PACS or [employer]. Aortic Atherosclerosis (ICD10-I70.0). Electronically Signed   By: Vinnie Langton M.D.   On: 02/19/2021 10:51        Scheduled Meds:  apixaban  10 mg Oral BID   Followed by   Derrill Memo ON 02/26/2021] apixaban  5 mg Oral BID   docusate sodium  100 mg Oral BID   Continuous Infusions:  sodium chloride 10 mL/hr at 02/20/21 0428   ampicillin-sulbactam (UNASYN) IV 3 g (02/21/21 0425)     LOS: 4 days    Time spent: 27 minutes    Sharen Hones, MD [facility]   To contact the attending provider between 7A-7P or the covering provider during after hours 7P-7A, please log into the web site [email or web address] and access using universal [place] password for that web site. If you do not have the password, please call the hospital operator.  02/21/2021, 10:19 AM

## 2021-02-22 ENCOUNTER — Encounter: Payer: Self-pay | Admitting: Physician Assistant

## 2021-02-22 ENCOUNTER — Telehealth: Payer: Self-pay

## 2021-02-22 ENCOUNTER — Other Ambulatory Visit: Payer: Self-pay

## 2021-02-22 ENCOUNTER — Telehealth: Payer: Self-pay | Admitting: Urology

## 2021-02-22 ENCOUNTER — Other Ambulatory Visit: Payer: Self-pay | Admitting: Urology

## 2021-02-22 DIAGNOSIS — I2699 Other pulmonary embolism without acute cor pulmonale: Secondary | ICD-10-CM

## 2021-02-22 MED ORDER — APIXABAN 5 MG PO TABS
ORAL_TABLET | ORAL | 3 refills | Status: DC
  Filled 2021-02-22: qty 60, 23d supply, fill #0

## 2021-02-22 MED ORDER — AMOXICILLIN-POT CLAVULANATE 875-125 MG PO TABS
1.0000 | ORAL_TABLET | Freq: Two times a day (BID) | ORAL | 0 refills | Status: AC
  Filled 2021-02-22: qty 10, 5d supply, fill #0

## 2021-02-22 MED ORDER — APIXABAN 5 MG PO TABS
10.0000 mg | ORAL_TABLET | Freq: Two times a day (BID) | ORAL | 0 refills | Status: DC
  Filled 2021-02-22: qty 20, 5d supply, fill #0

## 2021-02-22 MED ORDER — HYDROCODONE-ACETAMINOPHEN 5-325 MG PO TABS
2.0000 | ORAL_TABLET | Freq: Four times a day (QID) | ORAL | 0 refills | Status: DC | PRN
  Filled 2021-02-22: qty 12, 2d supply, fill #0

## 2021-02-22 NOTE — Telephone Encounter (Signed)
Generally for the sorts of cases, clear patient's to return to work at their follow-up appointment.  She could go back to work the next day if she is doing well.  When you call her, can you please also make sure she was able to pick up her Eliquis?  Hollice Espy, MD

## 2021-02-22 NOTE — Telephone Encounter (Signed)
Spoke with patient's daughter-will pick up Eliquis from [facility] for $10 for one month per [employer], PA-will need a one month follow up with PCP for RX. Voiced understanding.   Patient needs a work note for her absence due to being out for surgery till her post op follow up by end of day due to being at risk for her job Can be accessed through [employer].

## 2021-02-22 NOTE — Progress Notes (Signed)
I have sent the prescription in for the Vicodin to the [facility].  I have concerns they might not fill it because it is a narcotic.  She was also to be discharged on Eliquis and Augmentin.  Did she get these medications?

## 2021-02-22 NOTE — Telephone Encounter (Signed)
Work note sent via MyChart

## 2021-02-22 NOTE — Telephone Encounter (Signed)
Pt needs note for being out of work for surgery and expected rest and recovery for afterwards.

## 2021-02-22 NOTE — Telephone Encounter (Signed)
Transition Care Management Unsuccessful Follow-up Telephone Call  Call placed with assistance of [employer] # [id number]/[employer]  Date of discharge and from where:  02/21/2021, [facility]  Attempts:  1st Attempt  Reason for unsuccessful TCM follow-up call:  Left voice message on # [phone].  Call back requested to this CM.

## 2021-02-23 ENCOUNTER — Other Ambulatory Visit: Payer: Self-pay

## 2021-02-23 ENCOUNTER — Other Ambulatory Visit: Payer: Self-pay | Admitting: Anatomic Pathology & Clinical Pathology

## 2021-02-23 ENCOUNTER — Telehealth: Payer: Self-pay

## 2021-02-23 LAB — SURGICAL PATHOLOGY

## 2021-02-23 NOTE — Telephone Encounter (Signed)
Transition Care Management Unsuccessful Follow-up Telephone Call  Call completed with assistance of [employer] # [id number]/[employer].  Date of discharge and from where:  02/21/2021, [facility]  Attempts:  2nd Attempt  Reason for unsuccessful TCM follow-up call:  Left voice message  on # [phone].  Call back requested to this CM.    Need to discuss scheduling a hospital follow up appointment at [facility]

## 2021-02-24 ENCOUNTER — Telehealth: Payer: Self-pay

## 2021-02-24 ENCOUNTER — Telehealth: Payer: Self-pay | Admitting: Urology

## 2021-02-24 ENCOUNTER — Other Ambulatory Visit: Payer: Self-pay

## 2021-02-24 MED ORDER — DOCUSATE SODIUM 100 MG PO CAPS
100.0000 mg | ORAL_CAPSULE | Freq: Two times a day (BID) | ORAL | 0 refills | Status: DC
  Filled 2021-02-24: qty 30, 15d supply, fill #0

## 2021-02-24 NOTE — Telephone Encounter (Signed)
The gas in her stomach is common after surgical procedures and should improve over time.  I will see her at her upcoming visit.

## 2021-02-24 NOTE — Telephone Encounter (Signed)
From the discharge call:   She said that the "pain in the wounds is okay"  her concern is that she has" air in the stomach" which is uncomfortable. She said she can't push it out. She is eating okay and has been having bowel movements but she just can't get the air out.  She has the phone number for the surgeon and will call with her concerns.   she said she has all medications in order and did not have any questions about the med regime.  Scheduled to see Dr Myrtie Rana  on 03/02/2021 @ 1050 and Scheduled to see urology - 03/16/2021 and oncology - 03/17/2021.

## 2021-02-24 NOTE — Telephone Encounter (Signed)
Call placed to patient with assistance of [employer] # [id number]/[employer] and informed her per Dr Fynn Rana: The gas in her stomach is common after surgical procedures and should improve over time.  Dr Eldena Rana will see her at her upcoming visit .  The patient said that she understood. She also said she called urology and an order for medication, colace 100 mg,  was sent to [facility] but when someone came to pick it up for her they were told the pharmacy didn't have it. Explained to her that it can be picked up OTC.

## 2021-02-24 NOTE — Telephone Encounter (Signed)
Patient called and stated that she had sx on 02/17/21.  She stated that she has gas really bad and it doesn't seem to get any better.

## 2021-02-24 NOTE — Telephone Encounter (Signed)
Pt sister states she is having bloating, she is having difficulty  having bowel movements. She states she is passing small bowels that is just liquid. She is not having significant pain, just discomfort. She denies any fever chills, nausea vomiting or any urinary symptoms. She denies abdominal distention . As per shannon mcgowan PAC increase fluid intake and take colace for constipation. Pt family verbalized understanding.

## 2021-02-24 NOTE — Telephone Encounter (Signed)
Transition Care Management Follow-up Telephone Call  Call completed with assistance of Spanish Interpreter # [id number]/[employer] Date of discharge and from where: 02/21/2021, [facility] How have you been since you were released from the hospital? She said that the "pain in the wounds is okay"  her concern is that she has" air in the stomach" which is uncomfortable. She said she can't push it out. She is eating okay and has been having bowel movements but she just can't get the air out.  She has the phone number for the surgeon and will call with her concerns.  Any questions or concerns? Yes -noted above  Items Reviewed: Did the pt receive and understand the discharge instructions provided? Yes  Medications obtained and verified? Yes - she said she has all medications in order and did not have any questions about the med regime.  Other? No  Any new allergies since your discharge? No  Dietary orders reviewed? Yes Do you have support at home? Yes - her daughter  Maribel and Equipment/Supplies: Were home health services ordered? no If so, what is the name of the agency? N/a  Has the agency set up a time to come to the patient's home? not applicable Were any new equipment or medical supplies ordered?  No What is the name of the medical supply agency? N/a Were you able to get the supplies/equipment? not applicable Do you have any questions related to the use of the equipment or supplies? No  Functional Questionnaire: (I = Independent and D = Dependent) ADLs: independent   Follow up appointments reviewed:  PCP Hospital f/u appt confirmed? Yes  Scheduled to see Dr Eulalie Rana  on 03/02/2021 @ 1050. [facility] f/u appt confirmed? Yes  Scheduled to see urology on 03/16/2021 and oncology - 03/17/2021.  Are transportation arrangements needed? No  If their condition worsens, is the pt aware to call PCP or go to the Emergency Dept.? Yes Was the patient provided  with contact information for the PCP's office or ED? Yes Was to pt encouraged to call back with questions or concerns? Yes

## 2021-03-02 ENCOUNTER — Other Ambulatory Visit: Payer: Self-pay

## 2021-03-02 ENCOUNTER — Ambulatory Visit: Payer: Self-pay | Attending: Family Medicine | Admitting: Family Medicine

## 2021-03-02 ENCOUNTER — Encounter: Payer: Self-pay | Admitting: Family Medicine

## 2021-03-02 VITALS — BP 109/71 | HR 66 | Ht 59.0 in | Wt 177.6 lb

## 2021-03-02 DIAGNOSIS — I2699 Other pulmonary embolism without acute cor pulmonale: Secondary | ICD-10-CM

## 2021-03-02 DIAGNOSIS — C642 Malignant neoplasm of left kidney, except renal pelvis: Secondary | ICD-10-CM

## 2021-03-02 MED ORDER — APIXABAN 5 MG PO TABS
5.0000 mg | ORAL_TABLET | Freq: Two times a day (BID) | ORAL | 2 refills | Status: DC
  Filled 2021-03-02 – 2021-05-28 (×3): qty 60, 30d supply, fill #0

## 2021-03-02 NOTE — Patient Instructions (Signed)
Pulmonary Embolism A pulmonary embolism (PE) is a sudden blockage or decrease of blood flow in one or both lungs that happens when a clot travels into the arteries of the lung (pulmonary arteries). Most blockages come from a blood clot that forms in the vein of a leg or arm (deep vein thrombosis, DVT) and travels to the lungs. A clot is blood that has thickened into a gel or solid. PE is a dangerous and life-threatening condition that needs to be treated right away. What are the causes? This condition is usually caused by a blood clot that forms in a vein and moves to the lungs. In rare cases, it may be caused by air, fat, part of a tumor, or other tissue that moves through the veins and into the lungs. What increases the risk? The following factors may make you more likely to develop this condition: Experiencing a traumatic injury, such as breaking a hip or leg. Having: A spinal cord injury. Major surgery, especially hip or knee replacement, or surgery on parts of the nervous system or on the abdomen. A stroke. A blood-clotting disease. Long-term (chronic) lung or heart disease. Cancer, especially if you are being treated with chemotherapy. A central venous catheter. Taking medicines that contain estrogen. These include birth control pills and hormone replacement therapy. Being: Pregnant. In the period of time after your baby is delivered (postpartum). Older than age 13. Overweight. A smoker, especially if you have other risks. Not very active (sedentary), not being able to move at all, or spending long periods sitting, such as travel over 6 hours. You are also at a greater risk if you have a leg in a cast or splint. What are the signs or symptoms? Symptoms of this condition usually start suddenly and include: Shortness of breath during activity or at rest. Coughing, coughing up blood, or coughing up bloody mucus. Chest pain, back pain, or shoulder blade pain that gets worse with deep  breaths. Rapid or irregular heartbeat. Feeling light-headed or dizzy, or fainting. Feeling anxious. Pain and swelling in a leg. This is a symptom of DVT, which can lead to PE. How is this diagnosed? This condition may be diagnosed based on your medical history, a physical exam, and tests. Tests may include: Blood tests. An ECG (electrocardiogram) of the heart. A CT pulmonary angiogram. This test checks blood flow in and around your lungs. A ventilation-perfusion scan, also called a lung VQ scan. This test measures air flow and blood flow to the lungs. An ultrasound to check for a DVT. How is this treated? Treatment for this condition depends on many factors, such as the cause of your PE, your risk for bleeding or developing more clots, and other medical conditions you may have. Treatment aims to stop blood clots from forming or growing larger. In some cases, treatment may be aimed at breaking apart or removing the blood clot. Treatment may include: Medicines, such as: Blood thinning medicines, also called anticoagulants, to stop clots from forming and growing. Medicines that break apart clots (fibrinolytics). Procedures, such as: Using a flexible tube to remove a blood clot (embolectomy) or to deliver medicine to destroy it (catheter-directed thrombolysis). Surgery to remove the clot (surgical embolectomy). This is rare. You may need a combination of immediate, long-term, and extended treatments. Your treatment may continue for several months (maintenance therapy) or longer depending on your medical conditions. You and your health care provider will work together to choose the treatment program that is best for you. Follow  these instructions at home: Medicines Take over-the-counter and prescription medicines only as told by your health care provider. If you are taking blood thinners: Talk with your health care provider before you take any medicines that contain aspirin or NSAIDs, such as  ibuprofen. These medicines increase your risk for dangerous bleeding. Take your medicine exactly as told, at the same time every day. Avoid activities that could cause injury or bruising, and follow instructions about how to prevent falls. Wear a medical alert bracelet or carry a card that lists what medicines you take. Understand what foods and drugs interact with any medicines that you are taking. General instructions Ask your health care provider when you may return to your normal activities. Avoid sitting or lying for a long time without moving. Maintain a healthy weight. Ask your health care provider what weight is healthy for you. Do not use any products that contain nicotine or tobacco. These products include cigarettes, chewing tobacco, and vaping devices, such as e-cigarettes. If you need help quitting, ask your health care provider. Talk with your health care provider about any travel plans. It is important to make sure that you are still able to take your medicine while traveling. Keep all follow-up visits. This is important. Where to find more information American Lung Association: www.lung.org Centers for Disease Control and Prevention: http://www.wolf.info/ Contact a health care provider if: You missed a dose of your blood thinner medicine. You have a fever. Get help right away if: You have: New or increased pain, swelling, warmth, or redness in an arm or leg. Shortness of breath that gets worse during activity or at rest. Worsening chest pain. A rapid or irregular heartbeat. A severe headache. Vision changes. A serious fall or accident, or you hit your head. Blood in your vomit, stool, or urine. A cut that will not stop bleeding. You cough up blood. You feel light-headed or dizzy, and that feeling does not go away. You cannot move your arms or legs. You are confused or have memory loss. These symptoms may represent a serious problem that is an emergency. Do not wait to see if the  symptoms will go away. Get medical help right away. Call your local emergency services (911 in the [place] Do not drive yourself to the hospital. Summary A pulmonary embolism (PE) is a serious and potentially life-threatening condition. It happens when a blood clot from one part of the body travels to the arteries of the lung, causing a sudden blockage or decrease of blood flow to the lungs. This may result in shortness of breath, chest pain, dizziness, and fainting. Treatments for this condition usually include medicines to thin your blood (anticoagulants) or medicines to break apart blood clots. If you are given blood thinners, take your medicine exactly as told by your health care provider, at the same time every day. This is important. Understand what foods and drugs interact with any medicines that you are taking. If you have signs of PE or DVT, call your local emergency services (911 in the [place] This information is not intended to replace advice given to you by your health care provider. Make sure you discuss any questions you have with your health care provider. Document Revised: 02/21/2020 Document Reviewed: 02/21/2020 Elsevier Patient Education  2022 [employer].

## 2021-03-02 NOTE — Progress Notes (Signed)
Subjective:  Patient ID: Mariah Lang, female    DOB: Aug 19, 1970  Age: 50 y.o. MRN: [id number]  CC: Follow-up   HPI Mariah Lang is a 50 y.o. year old female with a history of plantar fasciitis, renal cell carcinoma (status post laparoscopic left radical nephrectomy and hysterectomy for uterine fibroids) here for Transition of Care visit Abdominal imaging had revealed left renal mass for which she underwent left radical nephrectomy on 02/17/2021. During hospitalization she was diagnosed with pulmonary embolism and commenced on Eliquis CTA chest revealed: IMPRESSION: 1. Study is positive for segmental and subsegmental sized pulmonary embolism in the right lung, which appears predominantly nonocclusive at this time. 2. Extensive dependent areas of atelectasis and airspace consolidation in the lungs bilaterally, suggesting sequela of recent aspiration. 3. Pneumoperitoneum, presumably related to recent laparotomy. Dilated loops of bowel in the visualized abdomen suggesting postoperative ileus. 4. Aortic atherosclerosis. 5. Cholelithiasis.  Interval History: Pathology revealed: DIAGNOSIS:  A. UTERUS; MYOMECTOMY:  - LEIOMYOMA UTERI.  - NEGATIVE FOR FEATURES OF MALIGNANCY.   B. UTERUS WITH CERVIX BILATERAL FALLOPIAN TUBES AND OVARIES; TOTAL  HYSTERECTOMY WITH BILATERAL SALPINGO-OOPHORECTOMY:  - UTERINE CERVIX:       - BENIGN TRANSFORMATION ZONE.       - NEGATIVE FOR SQUAMOUS INTRAEPITHELIAL LESION AND MALIGNANCY.  - ENDOMETRIUM:       - WEAKLY PROLIFERATIVE ENDOMETRIUM.       - NEGATIVE FOR ATYPICAL HYPERPLASIA/EIN AND MALIGNANCY.  - MYOMETRIUM:       - LEIOMYOMATA UTERI.       - NEGATIVE FOR FEATURES OF MALIGNANCY.  - UTERINE SEROSA:       - FIBROUS SEROSAL ADHESIONS.  - FALLOPIAN TUBES:       - DILATED RIGHT FALLOPIAN TUBE, COMPATIBLE WITH HEMATOSALPINX.       - OTHERWISE NO SIGNIFICANT HISTOPATHOLOGIC CHANGE.  - OVARIES:       -  BENIGN PHYSIOLOGIC CHANGES, INCLUDING BENIGN SIMPLE CYST.       - NEGATIVE FOR MALIGNANCY.   C. 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  - SEE CANCER SUMMARY.    She informs me she is unaware of her diagnosis of left renal cell carcinoma. Her post op visit with her Surgeon comes up in 4 days.  She does use her opioid analgesic as needed for pain. She complains of 'feeling air in her stomach' since her procedure.Her appetite is normal and she is moving her bowels okay. She has completed her twice daily dosing of Eliquis Past Medical History:  Diagnosis Date   Anemia     Past Surgical History:  Procedure Laterality Date   APPENDECTOMY     CESAREAN SECTION  2000   LAPAROSCOPIC NEPHRECTOMY, HAND ASSISTED Left 02/17/2021   Procedure: HAND ASSISTED LAPAROSCOPIC RADICAL  NEPHRECTOMY;  Surgeon: Hollice Espy, MD;  Location: [facility] ORS;  Service: Urology;  Laterality: Left;   LAPAROTOMY  02/17/2021   Procedure: LAPAROTOMY OR HAND ASSIST;  Surgeon: Gillis Ends, MD;  Location: [facility] ORS;  Service: Gynecology;;  Fibroid excision    OTHER SURGICAL HISTORY  1993   c-section   ROBOTIC ASSISTED LAPAROSCOPIC HYSTERECTOMY AND SALPINGECTOMY Bilateral 02/17/2021   Procedure: XI ROBOTIC ASSISTED LAPAROSCOPIC HYSTERECTOMY AND SALPINGECTOMY;  Surgeon: Gillis Ends, MD;  Location: [facility] ORS;  Service: Gynecology;  Laterality: Bilateral;    Family History  Problem Relation Age of Onset   Diabetes Mother    Hypertension Mother  Diabetes Paternal Grandmother     No Known Allergies  Outpatient Medications Prior to Visit  Medication Sig Dispense Refill   apixaban (ELIQUIS) 5 MG TABS tablet Take 1 tablet (5 mg total) by mouth 2 (two) times daily. 60 tablet 0   apixaban (ELIQUIS) 5 MG TABS tablet 10 mg (two 5 mg tablets) twice daily for 7 days then 1 tablet twIce daily for 3 months 60 tablet 3   docusate sodium  (COLACE) 100 MG capsule Take 1 capsule (100 mg total) by mouth 2 (two) times daily. (Patient not taking: Reported on 03/02/2021) 30 capsule 0   apixaban (ELIQUIS) 5 MG TABS tablet Take 2 tablets (10 mg total) by mouth 2 (two) times daily for 5 days. 20 tablet 0   No facility-administered medications prior to visit.     ROS Review of Systems  Constitutional:  Negative for activity change, appetite change and fatigue.  HENT:  Negative for congestion, sinus pressure and sore throat.   Eyes:  Negative for visual disturbance.  Respiratory:  Negative for cough, chest tightness, shortness of breath and wheezing.   Cardiovascular:  Negative for chest pain and palpitations.  Gastrointestinal:  Negative for abdominal distention, abdominal pain and constipation.  Endocrine: Negative for polydipsia.  Genitourinary:  Negative for dysuria and frequency.  Musculoskeletal:  Negative for arthralgias and back pain.  Skin:  Negative for rash.  Neurological:  Negative for tremors, light-headedness and numbness.  Hematological:  Does not bruise/bleed easily.  Psychiatric/Behavioral:  Negative for agitation and behavioral problems.   Objective:  BP 109/71   Pulse 66   Ht 4\' 11"  (1.499 m)   Wt 177 lb 9.6 oz (80.6 kg)   LMP 01/18/2019   SpO2 99%   BMI 35.87 kg/m   BP/Weight 03/02/2021 02/21/2021 16/38/4536  Systolic BP 468 032 -  Diastolic BP 71 68 -  Wt. (Lbs) 177.6 - 186.88  BMI 35.87 - 37.75      Physical Exam Constitutional:      Appearance: She is well-developed.  Cardiovascular:     Rate and Rhythm: Normal rate.     Heart sounds: Normal heart sounds. No murmur heard. Pulmonary:     Effort: Pulmonary effort is normal.     Breath sounds: Normal breath sounds. No wheezing or rales.  Chest:     Chest wall: No tenderness.  Abdominal:     General: Bowel sounds are normal. There is no distension.     Palpations: Abdomen is soft. There is no mass.     Tenderness: There is no abdominal  tenderness.     Comments: Circular ring around periumbilical region.  No tenderness to palpation  Musculoskeletal:        General: Normal range of motion.     Right lower leg: No edema.     Left lower leg: No edema.  Neurological:     Mental Status: She is alert and oriented to person, place, and time.  Psychiatric:        Mood and Affect: Mood normal.    CMP Latest Ref Rng & Units 02/21/2021 02/20/2021 02/19/2021  Glucose 70 - 99 mg/dL 139(H) 112(H) 104(H)  BUN 6 - 20 mg/dL 9 8 10   Creatinine 0.44 - 1.00 mg/dL 0.72 0.75 0.68  Sodium 135 - 145 mmol/L 136 132(L) 133(L)  Potassium 3.5 - 5.1 mmol/L 4.2 4.0 3.5  Chloride 98 - 111 mmol/L 99 97(L) 105  CO2 22 - 32 mmol/L 29 27 25   Calcium 8.9 -  10.3 mg/dL 8.7(L) 8.8(L) 8.0(L)  Total Protein 6.5 - 8.1 g/dL - - 6.4(L)  Total Bilirubin 0.3 - 1.2 mg/dL - - 0.8  Alkaline Phos 38 - 126 U/L - - 57  AST 15 - 41 U/L - - 43(H)  ALT 0 - 44 U/L - - 34    Lipid Panel     Component Value Date/Time   CHOL 189 09/14/2020 1011   TRIG 190 (H) 09/14/2020 1011   HDL 46 09/14/2020 1011   CHOLHDL 4.1 09/14/2020 1011   CHOLHDL 3.3 09/02/2014 1111   VLDL 14 09/02/2014 1111   LDLCALC 110 (H) 09/14/2020 1011    CBC    Component Value Date/Time   WBC 7.5 02/21/2021 0508   RBC 4.43 02/21/2021 0508   HGB 11.5 (L) 02/21/2021 0508   HGB 14.6 09/14/2020 1011   HCT 36.3 02/21/2021 0508   HCT 48.4 (H) 09/14/2020 1011   PLT 401 (H) 02/21/2021 0508   PLT 449 09/14/2020 1011   MCV 81.9 02/21/2021 0508   MCV 83 09/14/2020 1011   MCH 26.0 02/21/2021 0508   MCHC 31.7 02/21/2021 0508   RDW 16.7 (H) 02/21/2021 0508   RDW 17.5 (H) 09/14/2020 1011   LYMPHSABS 2.0 02/21/2021 0508   LYMPHSABS 2.0 09/14/2020 1011   MONOABS 0.5 02/21/2021 0508   EOSABS 0.3 02/21/2021 0508   EOSABS 0.1 09/14/2020 1011   BASOSABS 0.1 02/21/2021 0508   BASOSABS 0.1 09/14/2020 1011    Lab Results  Component Value Date   HGBA1C 5.7 (A) 09/14/2020    Assessment & Plan:   1. Acute pulmonary embolism without acute cor pulmonale, unspecified pulmonary embolism type ([place]) She will remain on anticoagulation for 3 months-plan to discontinue Eliquis at her next visit in 3 months. I have checked with the pharmacy to ensure there is no confusion with regards to her Eliquis. - apixaban (ELIQUIS) 5 MG TABS tablet; Take 1 tablet (5 mg total) by mouth 2 (two) times daily.  Dispense: 60 tablet; Refill: 2  2. Renal cell carcinoma of left kidney (HCC) Status post left nephrectomy Continue pain medication as needed Reassured that abdominal bloating is not uncommon with laparoscopic procedures and this should improve over time Advised to monitor slight discoloration in periumbilical ring for progression and if this is noted she needs to get in touch with her surgeon My exam is negative for tenderness    Meds ordered this encounter  Medications   apixaban (ELIQUIS) 5 MG TABS tablet    Sig: Take 1 tablet (5 mg total) by mouth 2 (two) times daily.    Dispense:  60 tablet    Refill:  2    Follow-up: Return in about 3 months (around 06/01/2021) for Chronic medical conditions.       Charlott Rakes, MD, FAAFP. [facility] and [place], [place]   03/02/2021, 12:56 PM

## 2021-03-03 ENCOUNTER — Telehealth: Payer: Self-pay | Admitting: *Deleted

## 2021-03-03 NOTE — Telephone Encounter (Signed)
Contacted patient's sister-verified patient took temperature-was 97.9 degrees also has an intermittent headache. She has been taking tylenol with symptoms improving. Denies any other symptoms. Has been taking Augmentin daily still has some tablets left and wanted to verify to continue. Per Dr. Erlene Quan continue taking till completed. Aware to seek medical attention with any abdominal pain and fevers of 101 or greater.

## 2021-03-03 NOTE — Telephone Encounter (Signed)
Patient called in today and states she has a fever . She did not check her temp. I advised them to get a temp  and call us back. She woke up this morning not feeling well. They will call back

## 2021-03-04 ENCOUNTER — Encounter: Payer: Self-pay | Admitting: Emergency Medicine

## 2021-03-04 ENCOUNTER — Emergency Department: Payer: Self-pay

## 2021-03-04 ENCOUNTER — Ambulatory Visit: Payer: Self-pay | Admitting: *Deleted

## 2021-03-04 DIAGNOSIS — Z7901 Long term (current) use of anticoagulants: Secondary | ICD-10-CM | POA: Insufficient documentation

## 2021-03-04 DIAGNOSIS — J101 Influenza due to other identified influenza virus with other respiratory manifestations: Secondary | ICD-10-CM | POA: Insufficient documentation

## 2021-03-04 DIAGNOSIS — Z20822 Contact with and (suspected) exposure to covid-19: Secondary | ICD-10-CM | POA: Insufficient documentation

## 2021-03-04 LAB — CBC WITH DIFFERENTIAL/PLATELET
Abs Immature Granulocytes: 0.03 10*3/uL (ref 0.00–0.07)
Basophils Absolute: 0.1 10*3/uL (ref 0.0–0.1)
Basophils Relative: 1 %
Eosinophils Absolute: 0 10*3/uL (ref 0.0–0.5)
Eosinophils Relative: 0 %
HCT: 41.6 % (ref 36.0–46.0)
Hemoglobin: 13.3 g/dL (ref 12.0–15.0)
Immature Granulocytes: 0 %
Lymphocytes Relative: 32 %
Lymphs Abs: 3 10*3/uL (ref 0.7–4.0)
MCH: 26.3 pg (ref 26.0–34.0)
MCHC: 32 g/dL (ref 30.0–36.0)
MCV: 82.4 fL (ref 80.0–100.0)
Monocytes Absolute: 1.3 10*3/uL — ABNORMAL HIGH (ref 0.1–1.0)
Monocytes Relative: 13 %
Neutro Abs: 5.1 10*3/uL (ref 1.7–7.7)
Neutrophils Relative %: 54 %
Platelets: 594 10*3/uL — ABNORMAL HIGH (ref 150–400)
RBC: 5.05 MIL/uL (ref 3.87–5.11)
RDW: 16.5 % — ABNORMAL HIGH (ref 11.5–15.5)
WBC: 9.5 10*3/uL (ref 4.0–10.5)
nRBC: 0 % (ref 0.0–0.2)

## 2021-03-04 LAB — COMPREHENSIVE METABOLIC PANEL
ALT: 32 U/L (ref 0–44)
AST: 24 U/L (ref 15–41)
Albumin: 4 g/dL (ref 3.5–5.0)
Alkaline Phosphatase: 80 U/L (ref 38–126)
Anion gap: 6 (ref 5–15)
BUN: 17 mg/dL (ref 6–20)
CO2: 26 mmol/L (ref 22–32)
Calcium: 9 mg/dL (ref 8.9–10.3)
Chloride: 101 mmol/L (ref 98–111)
Creatinine, Ser: 0.75 mg/dL (ref 0.44–1.00)
GFR, Estimated: >60 mL/min (ref >60)
Glucose, Bld: 102 mg/dL — ABNORMAL HIGH (ref 70–99)
Potassium: 4.3 mmol/L (ref 3.5–5.1)
Sodium: 133 mmol/L — ABNORMAL LOW (ref 135–145)
Total Bilirubin: 0.6 mg/dL (ref 0.3–1.2)
Total Protein: 8.3 g/dL — ABNORMAL HIGH (ref 6.5–8.1)

## 2021-03-04 LAB — RESP PANEL BY RT-PCR (FLU A&B, COVID) ARPGX2
Influenza A by PCR: POSITIVE — AB
Influenza B by PCR: NEGATIVE
SARS Coronavirus 2 by RT PCR: NEGATIVE

## 2021-03-04 LAB — LACTIC ACID, PLASMA: Lactic Acid, Venous: 1.1 mmol/L (ref 0.5–1.9)

## 2021-03-04 IMAGING — CR DG CHEST 2V
1 series · 2 of 2 positions shown · non-contrast
Comparison: [DATE]

CLINICAL DATA: Nasal congestion, headache and sore throat.

EXAM:
CHEST - 2 VIEW

[Series 1: dg chest 2 view · 0.14mm/px · 2 of 2 slices shown]
[im 1/2]
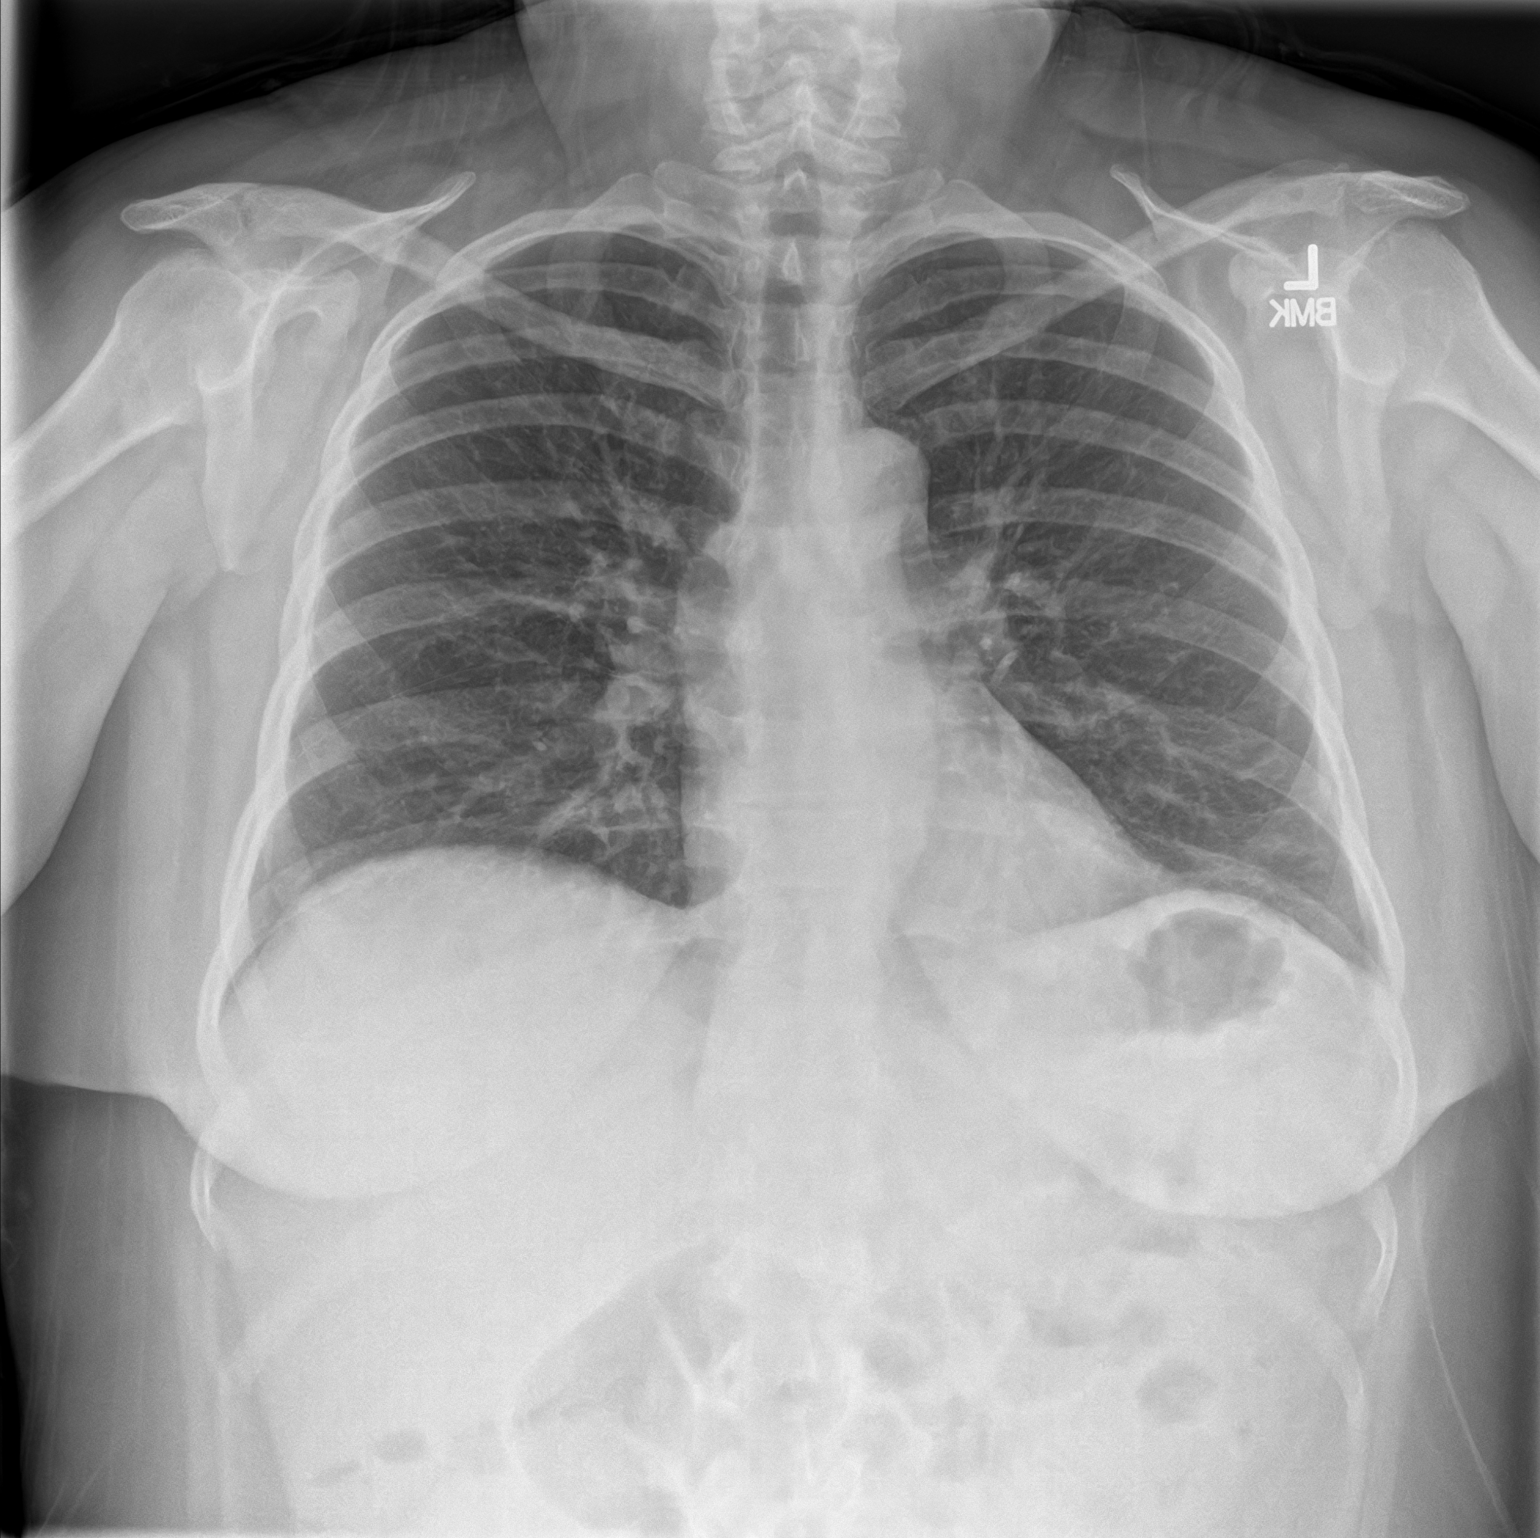
[im 2/2]
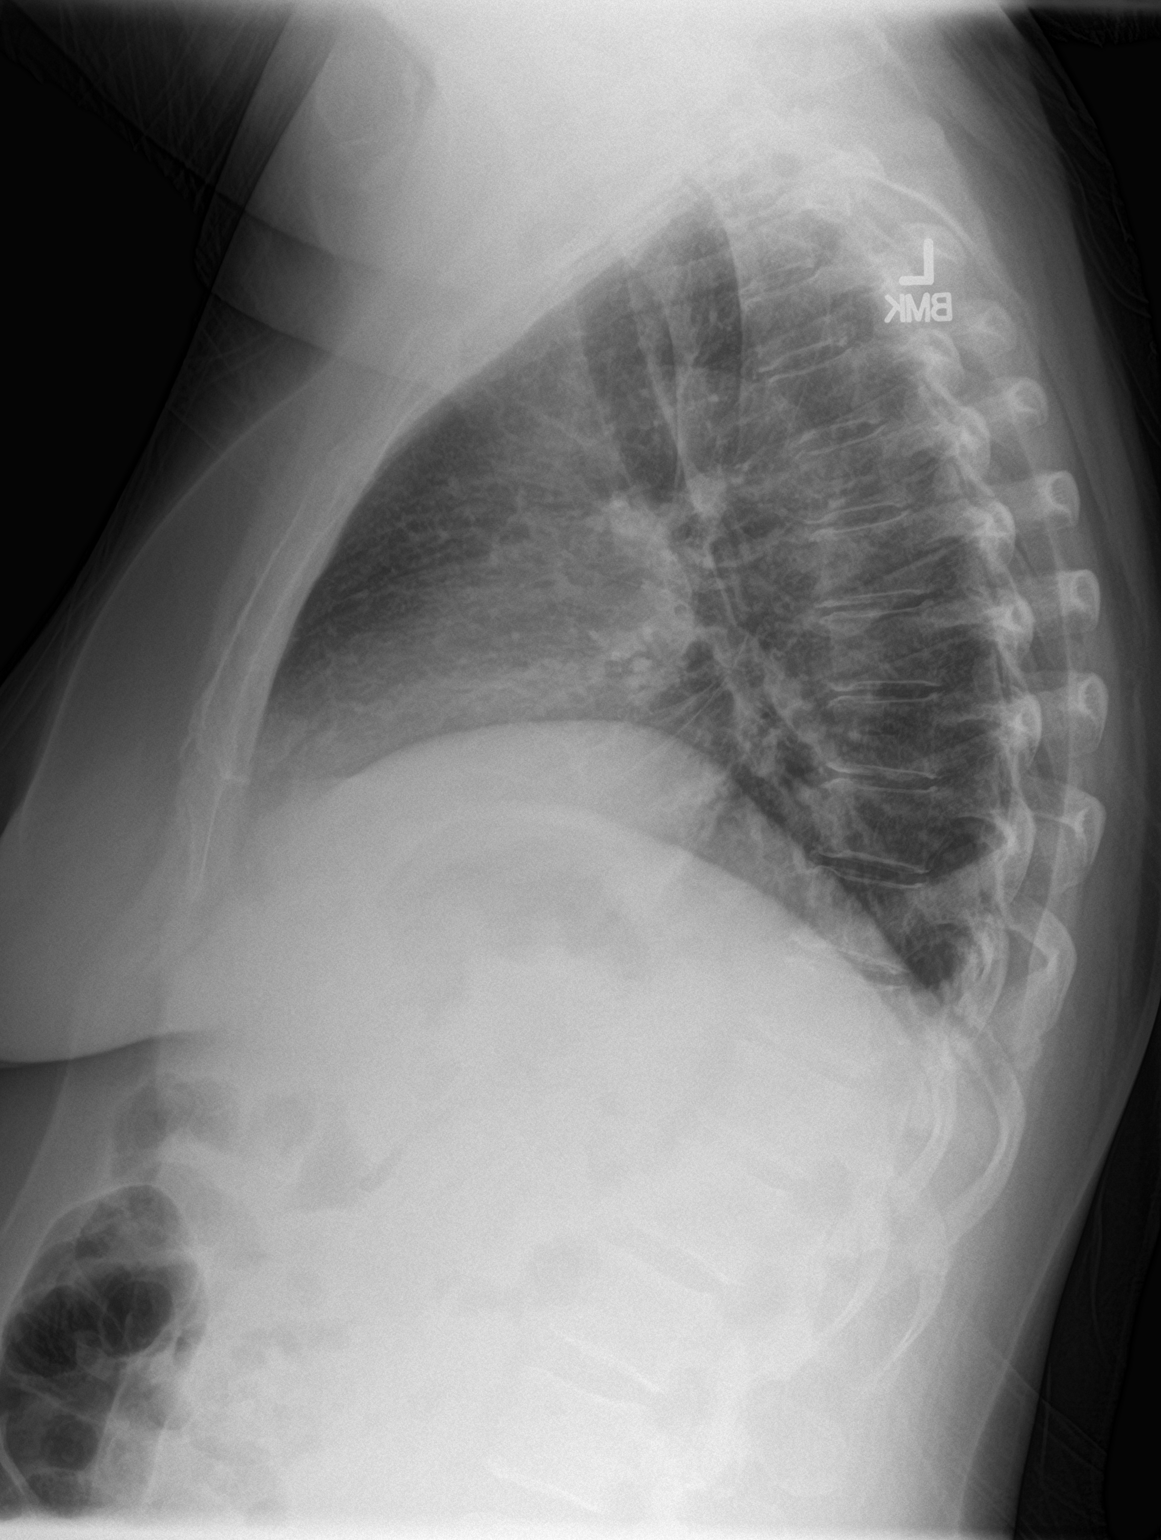

[2 of 2 positions shown; findings below may reference images not displayed]

FINDINGS: Mild linear atelectasis is seen within the bilateral lung bases.
There is no evidence of acute infiltrate, pleural effusion or
pneumothorax. The heart size and mediastinal contours are within
normal limits. The visualized skeletal structures are unremarkable.
IMPRESSION: Mild bibasilar linear atelectasis.

## 2021-03-04 NOTE — ED Notes (Signed)
Pt reports 1 episode of emesis yesterday. Surgical site causing more pain today

## 2021-03-04 NOTE — ED Triage Notes (Signed)
Pt in with some nasal congestion, HA and sore throat since yesterday. Pt reports she recently had laparoscopic surgery to remove uterus and L kidney 2 wks ago. No post-op problems to this point, but her surgical office told her to be evaluated by ED with her symptoms today. States she feels she's having fever, temp 98.8 in triage.

## 2021-03-04 NOTE — Telephone Encounter (Signed)
Addressed in telephone encounter.

## 2021-03-04 NOTE — Telephone Encounter (Signed)
Reason for Disposition  Patient sounds very sick or weak to the triager  Answer Assessment - Initial Assessment Questions 1. ONSET: "When did the throat start hurting?" (Hours or days ago)      Tuesday 2. SEVERITY: "How bad is the sore throat?" (Scale 1-10; mild, moderate or severe)   - MILD (1-3):  doesn't interfere with eating or normal activities   - MODERATE (4-7): interferes with eating some solids and normal activities   - SEVERE (8-10):  excruciating pain, interferes with most normal activities   - SEVERE DYSPHAGIA: can't swallow liquids, drooling     9/10 3. STREP EXPOSURE: "Has there been any exposure to strep within the past week?" If Yes, ask: "What type of contact occurred?"      no 4.  VIRAL SYMPTOMS: "Are there any symptoms of a cold, such as a runny nose, cough, hoarse voice or red eyes?"      no 5. FEVER: "Do you have a fever?" If Yes, ask: "What is your temperature, how was it measured, and when did it start?"     102.0 6. PUS ON THE TONSILS: "Is there pus on the tonsils in the back of your throat?"     Unsure 7. OTHER SYMPTOMS: "Do you have any other symptoms?" (e.g., difficulty breathing, headache, rash)     Back of head, headache, 9/10  Protocols used: Sore Throat-A-AH

## 2021-03-04 NOTE — Telephone Encounter (Signed)
Assisted by sister Mitzi who is translating. Reports pt with fever of 102.0, few hours ago. States has been ranging 101, 102, onset Tuesday. Reports severe sore throat, 9/10 headache, unresolved with tylenol. Also with nausea and vomiting yesterday. Chills. Headache is at back of head. Pt had surgery 1 week ago per sister's report "Uterus and kidney removed." Advised ED, states will follow disposition. After hours call.

## 2021-03-05 ENCOUNTER — Other Ambulatory Visit: Payer: Self-pay

## 2021-03-05 ENCOUNTER — Emergency Department
Admission: EM | Admit: 2021-03-05 | Discharge: 2021-03-05 | Disposition: A | Discharge status: Home / Self Care | Payer: Self-pay | Attending: Emergency Medicine | Admitting: Emergency Medicine

## 2021-03-05 DIAGNOSIS — J101 Influenza due to other identified influenza virus with other respiratory manifestations: Secondary | ICD-10-CM

## 2021-03-05 MED ORDER — ONDANSETRON 4 MG PO TBDP
4.0000 mg | ORAL_TABLET | Freq: Once | ORAL | Status: AC
  Administered 2021-03-05: 4 mg via ORAL
  Filled 2021-03-05: qty 1

## 2021-03-05 MED ORDER — ONDANSETRON 4 MG PO TBDP
ORAL_TABLET | ORAL | 0 refills | Status: DC
  Filled 2021-03-05: qty 30, 5d supply, fill #0

## 2021-03-05 MED ORDER — ONDANSETRON 4 MG PO TBDP: ORAL_TABLET | ORAL | 0 refills | Status: DC

## 2021-03-05 NOTE — ED Notes (Signed)
Patient verbalizes understanding of discharge instructions via Patent attorney. Opportunity for questioning and answers were provided. Armband removed by staff, pt discharged from ED. Ambulated out to lobby

## 2021-03-05 NOTE — ED Notes (Signed)
ED Provider at bedside. 

## 2021-03-05 NOTE — ED Provider Notes (Signed)
[facility] Emergency Department Provider Note  ____________________________________________   Event Date/Time   First MD Initiated Contact with Patient 03/05/21 [phone]     (approximate)  I have reviewed the triage vital signs and the nursing notes.   HISTORY  Chief Complaint Chills, Fever, and Post-op Problem  The patient and/or family speak(s) Spanish.  They understand they have the right to the use of a hospital interpreter, however at this time they prefer to speak directly with me in [place].  They know that they can ask for an interpreter at any time.   HPI Mariah Lang is a 50 y.o. female who recently had a laparoscopic left nephrectomy and hysterectomy and bilateral salpingectomy (about 2 weeks ago).  She presents for evaluation of about 24 hours of general malaise, fatigue, subjective fever, nasal congestion, sore throat, and posterior headache.  She has also had some nausea and vomiting and decreased oral intake.  She does not have any worse abdominal pain than she had prior to the onset of the symptoms.  She says she has recovered well from the surgery.  These new symptoms started acutely and have been severe.  She contacted Dr. Erlene Quan, the surgeon who removed her kidney, and she recommended she be evaluated to determine if her current symptoms are related to her recent surgeries.  She denies shortness of breath and chest pain and has had no dysuria.   Past Medical History:  Diagnosis Date   Anemia     Patient Active Problem List   Diagnosis Date Noted   Hyponatremia 02/20/2021   Aspiration pneumonia of both lower lobes due to gastric secretions ([place]) 02/19/2021   Pulmonary embolism ([place]) 02/19/2021   Acute hypoxemic respiratory failure ([place]) 02/19/2021   Renal cell carcinoma ([place]) 02/17/2021   Ankle edema, bilateral 09/14/2020   Vitamin D deficiency 09/14/2020   Thrombocytosis 05/11/2019   Language barrier 12/31/2018    Screening breast examination 12/04/2018   Fibroid uterus 12/18/2014   Menorrhagia, premenopausal 11/24/2014   Iron deficiency anemia 11/24/2014   Iron deficiency anemia due to chronic blood loss 09/02/2014   Pap smear for cervical cancer screening 09/02/2014   Annual physical exam 09/02/2014   Dysmenorrhea 02/15/2013   Anemia due to Dysmenorrhea 02/15/2013    Past Surgical History:  Procedure Laterality Date   APPENDECTOMY     CESAREAN SECTION  2000   LAPAROSCOPIC NEPHRECTOMY, HAND ASSISTED Left 02/17/2021   Procedure: HAND ASSISTED LAPAROSCOPIC RADICAL  NEPHRECTOMY;  Surgeon: Hollice Espy, MD;  Location: [facility] ORS;  Service: Urology;  Laterality: Left;   LAPAROTOMY  02/17/2021   Procedure: LAPAROTOMY OR HAND ASSIST;  Surgeon: Gillis Ends, MD;  Location: [facility] OR[facility];  Service: Gynecology;;  Fibroid excision    OTHER SURGICAL HISTORY  1993   c-section   ROBOTIC ASSISTED LAPAROSCOPIC HYSTERECTOMY AND SALPINGECTOMY Bilateral 02/17/2021   Procedure: XI ROBOTIC ASSISTED LAPAROSCOPIC HYSTERECTOMY AND SALPINGECTOMY;  Surgeon: Gillis Ends, MD;  Location: [facility] ORS;  Service: Gynecology;  Laterality: Bilateral;    Prior to Admission medications   Medication Sig Start Date End Date Taking? Authorizing Provider  ondansetron (ZOFRAN-ODT) 4 MG disintegrating tablet Allow 1-2 tablets to dissolve in your mouth every 8 hours as needed for nausea/vomiting 03/05/21  Yes Hinda Kehr, MD  apixaban (ELIQUIS) 5 MG TABS tablet Take 1 tablet (5 mg total) by mouth 2 (two) times daily. 03/02/21   Charlott Rakes, MD  docusate sodium (COLACE) 100 MG capsule Take 1 capsule (100 mg  total) by mouth 2 (two) times daily. Patient not taking: Reported on 03/02/2021 02/24/21   Hollice Espy, MD    Allergies Patient has no known allergies.  Family History  Problem Relation Age of Onset   Diabetes Mother    Hypertension Mother    Diabetes Paternal Grandmother     Social History Social  History   Tobacco Use   Smoking status: Never   Smokeless tobacco: Never  Vaping Use   Vaping Use: Never used  Substance Use Topics   Alcohol use: No   Drug use: No    Review of Systems Constitutional: +fever/chills Eyes: No visual changes. ENT: +sore throat. + Nasal congestion Cardiovascular: Denies chest pain. Respiratory: Denies shortness of breath. Gastrointestinal: No abdominal pain.  Some nausea and vomiting and decreased oral intake.  No diarrhea.  No constipation. Genitourinary: Negative for dysuria. Musculoskeletal: Negative for neck pain.  Negative for back pain. Integumentary: Negative for rash. Neurological: Positive for posterior headache.  Negative for focal numbness or weakness.   ____________________________________________   PHYSICAL EXAM:  VITAL SIGNS: ED Triage Vitals [03/04/21 2155]  Enc Vitals Group     BP (!) 110/99     Pulse Rate 93     Resp 18     Temp 98.8 F (37.1 C)     Temp Source Oral     SpO2 98 %     Weight 80.6 kg (177 lb 9.6 oz)     Height      Head Circumference      Peak Flow      Pain Score      Pain Loc      Pain Edu?      Excl. in [place]?     Constitutional: Alert and oriented.  Eyes: Conjunctivae are normal.  Head: Atraumatic. Nose: +congestion/rhinnorhea. Mouth/Throat: Patient is wearing a mask. Neck: No stridor.  No meningeal signs.   Cardiovascular: Normal rate, regular rhythm. Good peripheral circulation. Respiratory: Normal respiratory effort.  No retractions. Gastrointestinal: Soft and nondistended.  Mild generalized tenderness to palpation throughout, slightly worse on the left side, generally reassuring.  Patient does not seem concerned by the discomfort and it is no different than she had immediately following the surgery. Musculoskeletal: No lower extremity tenderness nor edema. No gross deformities of extremities. Neurologic:  Normal speech and language. No gross focal neurologic deficits are appreciated.   Skin:  Skin is warm, dry and intact. Psychiatric: Mood and affect are normal. Speech and behavior are normal.  ____________________________________________   LABS (all labs ordered are listed, but only abnormal results are displayed)  Labs Reviewed  RESP PANEL BY RT-PCR (FLU A&B, COVID) ARPGX2 - Abnormal; Notable for the following components:      Result Value   Influenza A by PCR POSITIVE (*)    All other components within normal limits  COMPREHENSIVE METABOLIC PANEL - Abnormal; Notable for the following components:   Sodium 133 (*)    Glucose, Bld 102 (*)    Total Protein 8.3 (*)    All other components within normal limits  CBC WITH DIFFERENTIAL/PLATELET - Abnormal; Notable for the following components:   RDW 16.5 (*)    Platelets 594 (*)    Monocytes Absolute 1.3 (*)    All other components within normal limits  LACTIC ACID, PLASMA  URINALYSIS, ROUTINE W REFLEX MICROSCOPIC  POC URINE PREG, ED   ____________________________________________   RADIOLOGY Ursula Alert, personally viewed and evaluated these images (plain radiographs) as part of  my medical decision making, as well as reviewing the written report by the radiologist.  ED MD interpretation: No acute abnormalities on chest x-ray  Official radiology report(s): DG Chest 2 View  Result Date: 03/04/2021 CLINICAL DATA:  Nasal congestion, headache and sore throat. EXAM: CHEST - 2 VIEW COMPARISON:  April 15, 2013 FINDINGS: Mild linear atelectasis is seen within the bilateral lung bases. There is no evidence of acute infiltrate, pleural effusion or pneumothorax. The heart size and mediastinal contours are within normal limits. The visualized skeletal structures are unremarkable. IMPRESSION: Mild bibasilar linear atelectasis. Electronically Signed   By: Virgina Norfolk M.D.   On: 03/04/2021 22:25    ____________________________________________   INITIAL IMPRESSION / MDM / [place] / ED COURSE  As part  of my medical decision making, I reviewed the following data within the [place] notes reviewed and incorporated, Labs reviewed , Old chart reviewed, Radiograph reviewed , and Notes from prior ED visits   Differential diagnosis includes, but is not limited to, viral illness, pneumonia, surgical complication such as abscess or other infection.  Patient's symptoms are consistent with a viral respiratory illness and she tested influenza A positive.  Her vital signs are stable and within normal limits.  Comprehensive metabolic panel and CBC are generally within normal limits and her lactic acid is normal.  No urinary symptoms.  She feels confident that she is not having any worse abdominal pain and there is no indication her symptoms are the result of her surgery.  I personally reviewed the patient's imaging and agree with the radiologist's interpretation that there is no acute abnormality on her chest x-ray.  I updated her with her lab results including her positive influenza test.  I explained that I would not recommend Tamiflu for her and explained the limited, if any, benefits, as well as potential side effects, and she agreed that she does not want to take it.  She still feels some mild nausea so I gave her a Zofran 4 mg ODT and a prescription for Zofran and encouraged plenty of fluid intake while she gets over the flu.  She understands and agrees and will follow up with her outpatient doctor.  I gave my usual and customary return precautions.           ____________________________________________  FINAL CLINICAL IMPRESSION(S) / ED DIAGNOSES  Final diagnoses:  Influenza A     MEDICATIONS GIVEN DURING THIS VISIT:  Medications  ondansetron (ZOFRAN-ODT) disintegrating tablet 4 mg (has no administration in time range)     ED Discharge Orders          Ordered    ondansetron (ZOFRAN-ODT) 4 MG disintegrating tablet        03/05/21 0344              Note:  This document was prepared using Dragon voice recognition software and may include unintentional dictation errors.   Hinda Kehr, MD 03/05/21 [phone]

## 2021-03-09 ENCOUNTER — Other Ambulatory Visit: Payer: Self-pay

## 2021-03-09 ENCOUNTER — Ambulatory Visit: Payer: Self-pay | Attending: Family Medicine | Admitting: Family Medicine

## 2021-03-09 ENCOUNTER — Encounter: Payer: Self-pay | Admitting: Family Medicine

## 2021-03-09 VITALS — BP 111/74 | HR 61 | Ht 59.0 in | Wt 178.6 lb

## 2021-03-09 DIAGNOSIS — Z23 Encounter for immunization: Secondary | ICD-10-CM

## 2021-03-09 DIAGNOSIS — I2699 Other pulmonary embolism without acute cor pulmonale: Secondary | ICD-10-CM

## 2021-03-09 DIAGNOSIS — J101 Influenza due to other identified influenza virus with other respiratory manifestations: Secondary | ICD-10-CM

## 2021-03-09 NOTE — Addendum Note (Signed)
Addended by: Gomez Cleverly on: 03/09/2021 03:44 PM   Modules accepted: Orders

## 2021-03-09 NOTE — Progress Notes (Addendum)
Subjective:  Patient ID: Mariah Lang, female    DOB: 04-Aug-1970  Age: 50 y.o. MRN: [id number]  CC: Hospitalization Follow-up   HPI Mariah Lang is a 50 y.o. year old female with a history of plantar fasciitis, renal cell carcinoma (status post laparoscopic left radical nephrectomy and hysterectomy for uterine fibroids), pulmonary embolism (diagnosed in 02/2021) She had a transition of care visit post hospitalization for renal cell carcinoma on 03/02/2021.  She presents today after an ED visit 4 days ago where she was managed for influenza A and she was advised to used Tylenol and OTC regimen CXR revealed mild bibasilar linear atelectasis.  Interval History: She complains of persisting nasal congestion but her myalgias have improved. She has no fever.  She has been using TheraFlu and Alka-Seltzer plus. She is yet to be seen by her Surgeon for follow up not until 03/16/21. Currently on anticoagulation with Eliqius for her PE diagnosed during her hospitalization in 02/2021. Past Medical History:  Diagnosis Date   Anemia     Past Surgical History:  Procedure Laterality Date   APPENDECTOMY     CESAREAN SECTION  2000   LAPAROSCOPIC NEPHRECTOMY, HAND ASSISTED Left 02/17/2021   Procedure: HAND ASSISTED LAPAROSCOPIC RADICAL  NEPHRECTOMY;  Surgeon: Hollice Espy, MD;  Location: [facility];  Service: Urology;  Laterality: Left;   LAPAROTOMY  02/17/2021   Procedure: LAPAROTOMY OR HAND ASSIST;  Surgeon: Gillis Ends, MD;  Location: [facility];  Service: Gynecology;;  Fibroid excision    OTHER SURGICAL HISTORY  1993   c-section   ROBOTIC ASSISTED LAPAROSCOPIC HYSTERECTOMY AND SALPINGECTOMY Bilateral 02/17/2021   Procedure: XI ROBOTIC ASSISTED LAPAROSCOPIC HYSTERECTOMY AND SALPINGECTOMY;  Surgeon: Gillis Ends, MD;  Location: [facility];  Service: Gynecology;  Laterality: Bilateral;    Family History  Problem Relation Age of Onset    Diabetes Mother    Hypertension Mother    Diabetes Paternal Grandmother     No Known Allergies  Outpatient Medications Prior to Visit  Medication Sig Dispense Refill   apixaban (ELIQUIS) 5 MG TABS tablet Take 1 tablet (5 mg total) by mouth 2 (two) times daily. 60 tablet 2   ondansetron (ZOFRAN-ODT) 4 MG disintegrating tablet Allow 1-2 tablets to dissolve in your mouth every 8 hours as needed for nausea/vomiting 30 tablet 0   ondansetron (ZOFRAN-ODT) 4 MG disintegrating tablet Allow 1 to 2 tablets to dissolve in your mouth every 8 hours as needed for [place]/vomiting. 30 tablet 0   docusate sodium (COLACE) 100 MG capsule Take 1 capsule (100 mg total) by mouth 2 (two) times daily. (Patient not taking: Reported on 03/02/2021) 30 capsule 0   No facility-administered medications prior to visit.     ROS Review of Systems  Constitutional:  Negative for activity change, appetite change and fatigue.  HENT:  Positive for congestion and sinus pressure. Negative for sore throat.   Eyes:  Negative for visual disturbance.  Respiratory:  Negative for cough, chest tightness, shortness of breath and wheezing.   Cardiovascular:  Negative for chest pain and palpitations.  Gastrointestinal:  Negative for abdominal distention, abdominal pain and constipation.  Endocrine: Negative for polydipsia.  Genitourinary:  Negative for dysuria and frequency.  Musculoskeletal:  Negative for arthralgias and back pain.  Skin:  Negative for rash.  Neurological:  Negative for tremors, light-headedness and numbness.  Hematological:  Does not bruise/bleed easily.  Psychiatric/Behavioral:  Negative for agitation and behavioral problems.    Objective:  BP 111/74  Pulse 61   Ht 4\' 11"  (1.499 m)   Wt 178 lb 9.6 oz (81 kg)   LMP 01/18/2019   SpO2 99%   BMI 36.07 kg/m   BP/Weight 03/09/2021 03/05/2021 88/08/275  Systolic BP 412 878 -  Diastolic BP 74 90 -  Wt. (Lbs) 178.6 - 177.6  BMI 36.07 35.87 -       Physical Exam Constitutional:      Appearance: She is well-developed.  Cardiovascular:     Rate and Rhythm: Normal rate.     Heart sounds: Normal heart sounds. No murmur heard. Pulmonary:     Effort: Pulmonary effort is normal.     Breath sounds: Normal breath sounds. No wheezing or rales.  Chest:     Chest wall: No tenderness.  Abdominal:     General: There is no distension.     Palpations: Abdomen is soft. There is no mass.     Tenderness: There is no abdominal tenderness.     Comments: Incision sites look good with improvement in previous periumbilical discoloration noted at last office visit. Slight right supraumbilical tenderness to palpation  Musculoskeletal:        General: Normal range of motion.     Right lower leg: No edema.     Left lower leg: No edema.  Neurological:     Mental Status: She is alert and oriented to person, place, and time.  Psychiatric:        Mood and Affect: Mood normal.    CMP Latest Ref Rng & Units 03/04/2021 02/21/2021 02/20/2021  Glucose 70 - 99 mg/dL 102(H) 139(H) 112(H)  BUN 6 - 20 mg/dL 17 9 8   Creatinine 0.44 - 1.00 mg/dL 0.75 0.72 0.75  Sodium 135 - 145 mmol/L 133(L) 136 132(L)  Potassium 3.5 - 5.1 mmol/L 4.3 4.2 4.0  Chloride 98 - 111 mmol/L 101 99 97(L)  CO2 22 - 32 mmol/L 26 29 27   Calcium 8.9 - 10.3 mg/dL 9.0 8.7(L) 8.8(L)  Total Protein 6.5 - 8.1 g/dL 8.3(H) - -  Total Bilirubin 0.3 - 1.2 mg/dL 0.6 - -  Alkaline Phos 38 - 126 U/L 80 - -  AST 15 - 41 U/L 24 - -  ALT 0 - 44 U/L 32 - -    Lipid Panel     Component Value Date/Time   CHOL 189 09/14/2020 1011   TRIG 190 (H) 09/14/2020 1011   HDL 46 09/14/2020 1011   CHOLHDL 4.1 09/14/2020 1011   CHOLHDL 3.3 09/02/2014 1111   VLDL 14 09/02/2014 1111   LDLCALC 110 (H) 09/14/2020 1011    CBC    Component Value Date/Time   WBC 9.5 03/04/2021 2204   RBC 5.05 03/04/2021 2204   HGB 13.3 03/04/2021 2204   HGB 14.6 09/14/2020 1011   HCT 41.6 03/04/2021 2204   HCT  48.4 (H) 09/14/2020 1011   PLT 594 (H) 03/04/2021 2204   PLT 449 09/14/2020 1011   MCV 82.4 03/04/2021 2204   MCV 83 09/14/2020 1011   MCH 26.3 03/04/2021 2204   MCHC 32.0 03/04/2021 2204   RDW 16.5 (H) 03/04/2021 2204   RDW 17.5 (H) 09/14/2020 1011   LYMPHSABS 3.0 03/04/2021 2204   LYMPHSABS 2.0 09/14/2020 1011   MONOABS 1.3 (H) 03/04/2021 2204   EOSABS 0.0 03/04/2021 2204   EOSABS 0.1 09/14/2020 1011   BASOSABS 0.1 03/04/2021 2204   BASOSABS 0.1 09/14/2020 1011    Lab Results  Component Value Date   HGBA1C 5.7 (  A) 09/14/2020    Assessment & Plan:  1. Influenza A Improving Continue supportive therapy Encouraged to rest, increase fluid intake and return if symptoms are worsening.  2.  Pulmonary embolism Currently on anticoagulation with Eliquis until 05/22/2021   No orders of the defined types were placed in this encounter.   Follow-up: Return for medical conditions keep appointment previously scheduled.       Charlott Rakes, MD, FAAFP. [facility] [place] [place], [place]   03/09/2021, 3:21 PM

## 2021-03-11 ENCOUNTER — Other Ambulatory Visit: Payer: Self-pay

## 2021-03-11 NOTE — Progress Notes (Signed)
Tumor Board Documentation  Kensley Valladares Jarome Lamas was presented by Dr Erlene Quan at our Tumor Board on 03/11/2021, which included representatives from medical oncology, radiation oncology, internal medicine, navigation, pathology, radiology, surgical, nutrition, research, palliative care, pulmonology, pharmacy.  Naelani currently presents as an external consult, for [place], for new positive pathology with history of the following treatments: surgical intervention(s), active survellience.  Additionally, we reviewed previous medical and familial history, history of present illness, and recent lab results along with all available histopathologic and imaging studies. The tumor board considered available treatment options and made the following recommendations: Active surveillance (No Clinical Trials available locally)    The following procedures/referrals were also placed: No orders of the defined types were placed in this encounter.   Clinical Trial Status: not discussed   Staging used: AJCC Stage Group AJCC Staging: T: 3a     Group: Stage 3 ChromophobeRenal Cell Carcinoma   National site-specific guidelines NCCN were discussed with respect to the case.  Tumor board is a meeting of clinicians from various specialty areas who evaluate and discuss patients for whom a multidisciplinary approach is being considered. Final determinations in the plan of care are those of the provider(s). The responsibility for follow up of recommendations given during tumor board is that of the provider.   Today's extended care, comprehensive team conference, Lekeisha was not present for the discussion and was not examined.   Multidisciplinary Tumor Board is a multidisciplinary case peer review process.  Decisions discussed in the Multidisciplinary Tumor Board reflect the opinions of the specialists present at the conference without having examined the patient.  Ultimately, treatment and diagnostic  decisions rest with the primary provider(s) and the patient.

## 2021-03-12 ENCOUNTER — Other Ambulatory Visit: Payer: Self-pay

## 2021-03-15 NOTE — Progress Notes (Addendum)
03/16/21 10:30 AM   Mariah Lang [birth year] [id number]  Referring provider:  Charlott Rakes, MD [address] [place],  [place] [address] Chief Complaint  Patient presents with   Follow-up     HPI: Mariah Lang is a 50 y.o.female with a personal history of left renal mass, who presents today for follow-up.   CT abdomen and pelvis contrast on 11/15/2020 revealed a 10.7 cm left upper pole rena mass, compatible with solid renal neoplasm such as renal cell carcinoma. No findings were suspicious for metastatic disease. Additionally, uterine fibroids, including a dominant 14.5 cm subserosal fibroid in the left uterine fundus was identified.  12/18/2020 MRI of abdomen revealed 10.3 cm solid enhancing mass arising from the anterior mid pole of the left kidney, high suspicious for renal cell carcinoma, no evidence of abdominal metastatic disease, and large uterine fibroid enhancing the left abdomen.    CT of chest showed no evidence of metastatic disease or other significant abnormality within the thorax.   She is s/p hand-assisted laparoscopic left radical nephrectomy with me and total hysterectomy, minilaparotomy, myomectomy with Dr.Secord on 02/17/2021. 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   Post op course complicated by nonobstructing PE currently on eliquis x 3 months.   She is accompanied today by a spanish interpreter. She recently got the influenza. She reports that she has been experiencing pain that radiates. She has not seen Dr.Secord yet for her uterine fibroids. She reports that she was having trouble standing preop but since then she has had improvement with this. She is concerned about returning to work.    PMH: Past Medical History:  Diagnosis Date   Anemia    Surgical History: Past Surgical History:  Procedure Laterality Date   APPENDECTOMY     CESAREAN SECTION  2000    LAPAROSCOPIC NEPHRECTOMY, HAND ASSISTED Left 02/17/2021   Procedure: HAND ASSISTED LAPAROSCOPIC RADICAL  NEPHRECTOMY;  Surgeon: Hollice Espy, MD;  Location: [facility];  Service: Urology;  Laterality: Left;   LAPAROTOMY  02/17/2021   Procedure: LAPAROTOMY OR HAND ASSIST;  Surgeon: Gillis Ends, MD;  Location: [facility];  Service: Gynecology;;  Fibroid excision    OTHER SURGICAL HISTORY  1993   c-section   ROBOTIC ASSISTED LAPAROSCOPIC HYSTERECTOMY AND SALPINGECTOMY Bilateral 02/17/2021   Procedure: XI ROBOTIC ASSISTED LAPAROSCOPIC HYSTERECTOMY AND SALPINGECTOMY;  Surgeon: Gillis Ends, MD;  Location: [facility];  Service: Gynecology;  Laterality: Bilateral;   Home Medications:  Allergies as of 03/16/2021   No Known Allergies      Medication List        Accurate as of March 16, 2021 10:30 AM. If you have any questions, ask your nurse or doctor.          STOP taking these medications    ondansetron 4 MG disintegrating tablet Commonly known as: ZOFRAN-ODT Stopped by: Hollice Espy, MD       TAKE these medications    docusate sodium 100 MG capsule Commonly known as: Colace Take 1 capsule (100 mg total) by mouth 2 (two) times daily.   Eliquis 5 MG Tabs tablet Generic drug: apixaban Take 1 tablet (5 mg total) by mouth 2 (two) times daily.       Allergies: No Known Allergies  Family History: Family History  Problem Relation Age of Onset   Diabetes Mother    Hypertension Mother    Diabetes Paternal Grandmother     Social History:  reports  that she has never smoked. She has never used smokeless tobacco. She reports that she does not drink alcohol and does not use drugs.   Physical Exam: BP 110/70   Pulse 73   Wt 179 lb 3.2 oz (81.3 kg)   LMP 01/18/2019   BMI 36.19 kg/m   Constitutional:  Alert and oriented, No acute distress. HEENT: [place] AT, moist mucus membranes.  Trachea midline, no masses. Cardiovascular: No clubbing, cyanosis, or  edema. Respiratory: Normal respiratory effort, no increased work of breathing. GU: Abd incision healing well with no further bruising or erythema.  Nontender.  Slightly puckering (upside down U shaped) over umbilicus which remains stable.  No fluctuance or concern for seroma or hernia. Skin: No rashes, bruises or suspicious lesions. Neurologic: Grossly intact, no focal deficits, moving all 4 extremities. Psychiatric: Normal mood and affect.  Laboratory Data:  Lab Results  Component Value Date   CREATININE 0.75 03/04/2021   Lab Results  Component Value Date   HGBA1C 5.7 (A) 09/14/2020   Assessment & Plan:    Chromophobe Renal cell carcinoma  - S/p hand-assisted laparoscopic left radical nephrectomy - We discussed pathology in detail today, pT3 stage 3. We discussed how her tumor was atypical pathology. Per NCCN guidelines, no additional adjuvant treatment outside of clinical trial - Recommend surveillance of this, CT scan q6 month interval - She was given a work note today for additional recovery in light of ongoing pain and complications  Return in 1 month for wound check and determine fitness to return to work  [employer] as a Education administrator for [employer], MD.,have documented all relevant documentation on the behalf of Hollice Espy, MD,as directed by  Hollice Espy, MD while in the presence of Hollice Espy, MD.  I have reviewed the above documentation for accuracy and completeness, and I agree with the above.   Hollice Espy, MD   [facility] [address] [place], [place] [address] [phone]

## 2021-03-16 ENCOUNTER — Telehealth: Payer: Self-pay

## 2021-03-16 ENCOUNTER — Other Ambulatory Visit: Payer: Self-pay

## 2021-03-16 ENCOUNTER — Ambulatory Visit (INDEPENDENT_AMBULATORY_CARE_PROVIDER_SITE_OTHER): Payer: Self-pay | Admitting: Urology

## 2021-03-16 VITALS — BP 110/70 | HR 73 | Wt 179.2 lb

## 2021-03-16 DIAGNOSIS — C642 Malignant neoplasm of left kidney, except renal pelvis: Secondary | ICD-10-CM

## 2021-03-16 NOTE — Telephone Encounter (Signed)
Sw pt son ( OK per pt) note was left at front desk.

## 2021-03-16 NOTE — Telephone Encounter (Signed)
Patient wants to know if she can have a note for work to be out until next month instead of limited

## 2021-03-16 NOTE — Telephone Encounter (Signed)
Yes, that is very reasonable.  She had a major operation and is still sore.  She is also had some postoperative complications like PE and the flu.  This will make her recovery more prolonged.  Hollice Espy, MD

## 2021-03-17 ENCOUNTER — Inpatient Hospital Stay: Payer: Self-pay

## 2021-03-17 ENCOUNTER — Other Ambulatory Visit: Payer: Self-pay

## 2021-03-17 ENCOUNTER — Inpatient Hospital Stay: Payer: Self-pay | Attending: Obstetrics and Gynecology | Admitting: Obstetrics and Gynecology

## 2021-03-17 VITALS — BP 136/79 | HR 62 | Temp 98.9°F | Resp 20 | Wt 179.6 lb

## 2021-03-17 DIAGNOSIS — D259 Leiomyoma of uterus, unspecified: Secondary | ICD-10-CM | POA: Insufficient documentation

## 2021-03-17 DIAGNOSIS — N9089 Other specified noninflammatory disorders of vulva and perineum: Secondary | ICD-10-CM

## 2021-03-17 DIAGNOSIS — R300 Dysuria: Secondary | ICD-10-CM

## 2021-03-17 DIAGNOSIS — Z90722 Acquired absence of ovaries, bilateral: Secondary | ICD-10-CM | POA: Insufficient documentation

## 2021-03-17 DIAGNOSIS — Z90710 Acquired absence of both cervix and uterus: Secondary | ICD-10-CM | POA: Insufficient documentation

## 2021-03-17 LAB — URINALYSIS, COMPLETE (UACMP) WITH MICROSCOPIC
Bacteria, UA: NONE SEEN
Bilirubin Urine: NEGATIVE
Glucose, UA: NEGATIVE mg/dL
Ketones, ur: NEGATIVE mg/dL
Leukocytes,Ua: NEGATIVE
Nitrite: NEGATIVE
Protein, ur: NEGATIVE mg/dL
Specific Gravity, Urine: 1.01 (ref 1.005–1.030)
pH: 6 (ref 5.0–8.0)

## 2021-03-17 NOTE — Progress Notes (Signed)
Gynecologic Oncology Postop Visit   Referring Provider: Dr. Erlene Quan  Chief Complaint: Postop visit  Subjective:  Sherhonda Gaspar Jarome Lamas is a 50 y.o. G2P2 female who is seen in consultation from Dr. Erlene Quan for uterine fibroids in setting of renal mass.  On 02/17/2021 she underwent Exam under anesthesia, minilaparotomy, myomectomy, robotic assisted total hysterectomy and bilateral salpingo-oophorectomy, and lysis of adhesions >45 minutes. Dr. Erlene Quan did a Hand-assisted laparoscopic left radical nephrectomy.  Pathology:  DIAGNOSIS:  A. UTERUS; MYOMECTOMY:  - LEIOMYOMA UTERI.  - NEGATIVE FOR FEATURES OF MALIGNANCY.   B. UTERUS WITH CERVIX BILATERAL FALLOPIAN TUBES AND OVARIES; TOTAL  HYSTERECTOMY WITH BILATERAL SALPINGO-OOPHORECTOMY:  - UTERINE CERVIX:       - BENIGN TRANSFORMATION ZONE.       - NEGATIVE FOR SQUAMOUS INTRAEPITHELIAL LESION AND MALIGNANCY.  - ENDOMETRIUM:       - WEAKLY PROLIFERATIVE ENDOMETRIUM.       - NEGATIVE FOR ATYPICAL HYPERPLASIA/EIN AND MALIGNANCY.  - MYOMETRIUM:       - LEIOMYOMATA UTERI.       - NEGATIVE FOR FEATURES OF MALIGNANCY.  - UTERINE SEROSA:       - FIBROUS SEROSAL ADHESIONS.  - FALLOPIAN TUBES:       - DILATED RIGHT FALLOPIAN TUBE, COMPATIBLE WITH HEMATOSALPINX.       - OTHERWISE NO SIGNIFICANT HISTOPATHOLOGIC CHANGE.  - OVARIES:       - BENIGN PHYSIOLOGIC CHANGES, INCLUDING BENIGN SIMPLE CYST.       - NEGATIVE FOR MALIGNANCY.   C. 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  - SEE CANCER SUMMARY   She presents to clinic today with an interpreter. She complains of abdominal pain/bloating. She was told that was due to gas from the surgery. She is having BM's every day to every other day. No flatus. No N/V. She complains of vulvar/periurethral irritation. No dysuria per say. She requests a note from work.   Gynecologic History:  Purvi Ruehl is a  pleasant G7P2 female who is seen in consultation from Dr. Janelis Rana for uterine fibroids in setting of renal mass.  She states she has at least a 6-7 year history of uterine fibroids. Her fibroids are palpable above her umbilicus. In the past they were 2-4 cm and then over the last two years have grown rapidly. She does have pelvic "cramping" pain like a menstrual cycle. She has bleeding monthly but it is much less than when she had regular cycles. She does not know if she has hot flashes, but feels like she has them now due to an antibiotic she is taking. She does not recall when she had prior imaging before the CT scan.   Patient presented to ED on 11/15/20 for LLQ abdominal pain. CT revealed 10.7 cm left upper pole renal mass compatible with solid renal neoplasm such as RCC. Additionally, uterine fibroids including a dominant 14.5 cm subserosal fibroid in the left uterine fundus was identified. She has abnormal uterine bleeding which has been managed by Dr. Ilda Basset, previously receiving injections but no longer taking d/t cost. Patient saw Dr. Erlene Quan on 11/25/20 for evaluation of renal mass. Nephrectomy was recommended. Patient referred to gyn-onc to evaluate for possible combined GU and GYN surgical case and determine if minimally invasive approach is possibility.    12/02/2020  Pap NILM/HRHPV negative  12/04/2020 FSH 60.5  12/19/20- MR Pelvis W Wo Contrast Markedly enlarged uterus with numerous fibroids. Dominant 12.9  cm pedunculated fibroid extends from the left uterine fundus into the left abdomen, but has a thick soft tissue pedicle of attachment. No intracavitary fibroids identified.   10.8 cm complex cystic lesion in the left adnexa, which is indeterminate. Its elongated/tubular configuration suggests the possibility of a cystic ovarian lesion combined with an adjacent hydrosalpinx,; malignancy cannot be excluded. Recommend correlation with tumor markers, and consider surgical evaluation versus  follow-up by MRI in 6 weeks.  12/19/20- MR Abdomen W WO Contrast 10.3 cm solid enhancing mass arising from the anterior midpole of the left kidney, highly suspicious for renal cell carcinoma.  No evidence of abdominal metastatic disease. Cholelithiasis. No radiographic evidence of cholecystitis or biliary ductal dilatation. Large uterine fibroid extending into the left abdomen. See separate report for pelvis MRI without and with contrast.     Problem List: Patient Active Problem List   Diagnosis Date Noted   Hyponatremia 02/20/2021   Aspiration pneumonia of both lower lobes due to gastric secretions ([place]) 02/19/2021   Pulmonary embolism ([place]) 02/19/2021   Acute hypoxemic respiratory failure ([place]) 02/19/2021   Renal cell carcinoma ([place]) 02/17/2021   Ankle edema, bilateral 09/14/2020   Vitamin D deficiency 09/14/2020   Thrombocytosis 05/11/2019   Language barrier 12/31/2018   Screening breast examination 12/04/2018   Fibroid uterus 12/18/2014   Menorrhagia, premenopausal 11/24/2014   Iron deficiency anemia 11/24/2014   Iron deficiency anemia due to chronic blood loss 09/02/2014   Pap smear for cervical cancer screening 09/02/2014   Annual physical exam 09/02/2014   Dysmenorrhea 02/15/2013   Anemia due to Dysmenorrhea 02/15/2013   Past Medical History: Past Medical History:  Diagnosis Date   Anemia    Past Surgical History: Past Surgical History:  Procedure Laterality Date   APPENDECTOMY     CESAREAN SECTION  2000   LAPAROSCOPIC NEPHRECTOMY, HAND ASSISTED Left 02/17/2021   Procedure: HAND ASSISTED LAPAROSCOPIC RADICAL  NEPHRECTOMY;  Surgeon: Hollice Espy, MD;  Location: [facility];  Service: Urology;  Laterality: Left;   LAPAROTOMY  02/17/2021   Procedure: LAPAROTOMY OR HAND ASSIST;  Surgeon: Gillis Ends, MD;  Location: [facility];  Service: Gynecology;;  Fibroid excision    OTHER SURGICAL HISTORY  1993   c-section   ROBOTIC ASSISTED LAPAROSCOPIC HYSTERECTOMY  AND SALPINGECTOMY Bilateral 02/17/2021   Procedure: XI ROBOTIC ASSISTED LAPAROSCOPIC HYSTERECTOMY AND SALPINGECTOMY;  Surgeon: Gillis Ends, MD;  Location: [facility];  Service: Gynecology;  Laterality: Bilateral;   Past Gynecologic History:  Menarche: 15 Menstrual details: irregular Menses regular: no Last Menstrual Period: 2020 History of OCP/HRT use:  History of Abnormal pap:  Last pap:  2016 History of STDs:  Contraception: no Sexually active:   OB History  Gravida Para Term Preterm AB Living  2 2 2  0 0 2  SAB IAB Ectopic Multiple Live Births  0 0 0   2    # Outcome Date GA Lbr Len/2nd Weight Sex Delivery Anes PTL Lv  2 Term      CS-Unspec     1 Term      CS-Unspec      Family History: Family History  Problem Relation Age of Onset   Diabetes Mother    Hypertension Mother    Diabetes Paternal Grandmother    Social History: Social History   Socioeconomic History   Marital status: Single    Spouse name: Not on file   Number of children: 2   Years of education: Not on file   Highest education level:  9th grade  Occupational History   Not on file  Tobacco Use   Smoking status: Never   Smokeless tobacco: Never  Vaping Use   Vaping Use: Never used  Substance and Sexual Activity   Alcohol use: No   Drug use: No   Sexual activity: Yes    Birth control/protection: None  Other Topics Concern   Not on file  Social History Narrative   ** Merged History Encounter **       Social Determinants of Health   Financial Resource Strain: Not on file  Food Insecurity: Not on file  Transportation Needs: Not on file  Physical Activity: Not on file  Stress: Not on file  Social Connections: Not on file  Intimate Partner Violence: Not on file   Allergies: No Known Allergies  Current Medications: Current Outpatient Medications  Medication Sig Dispense Refill   apixaban (ELIQUIS) 5 MG TABS tablet Take 1 tablet (5 mg total) by mouth 2 (two) times daily. 60  tablet 2   docusate sodium (COLACE) 100 MG capsule Take 1 capsule (100 mg total) by mouth 2 (two) times daily. (Patient not taking: Reported on 03/17/2021) 30 capsule 0   No current facility-administered medications for this visit.   Review of Systems  General: no complaints  HEENT: no complaints  Lungs: no complaints  Cardiac: no complaints  GI: no complaints  GU: no complaints  Musculoskeletal: no complaints  Extremities: no complaints  Skin: no complaints  Neuro: no complaints  Endocrine: no complaints  Psych: no complaints    She does not complain about hot flashes   Objective:  Physical Examination:  BP 136/79    Pulse 62    Temp 98.9 F (37.2 C)    Resp 20    Wt 179 lb 9.6 oz (81.5 kg)    LMP 01/18/2019    SpO2 100%    BMI 36.27 kg/m     ECOG Performance Status: 0  GENERAL: Patient is a well appearing female in no acute distress HEENT:  atraumatic normocephalic ABDOMEN:  Soft, nondistended, nontender other than one area RUQ with deep palpation.  INCISIONS: closed dry and intact. No evidence of erythema EXTREMITIES:  No peripheral edema.   NEURO:  Nonfocal. Well oriented.  Appropriate affect.  Pelvic chaperoned by RN EGBUS: no lesions Cervix: surgically absent Vagina: no lesions, no discharge or bleeding. Sutures intact.  Uterus: surgically absent BME: deferred        Assessment:  Jakyah Bradby is a 50 y.o. female diagnosed with enlarging symptomatic uterine fibroids and renal mass. Differential for the uterine fibroids includes leiomyosarcoma given rapid enlargement over the past 2 years s/p definitive surgery. Pathology benign.   Complex cystic lesion in the left adnexa - HEMATOSALPINX  Abdominal bleeding and bloating due to surgery. Slow recuperation. Overall exam reassuring.   Vulvar irritation - no focal findngs. DDX includes fungal infection which is common after surgery and antibiotics  Urethral irritation - no focal findings.  Possible UTI  Medical co-morbidities complicating care: prior C-section x 2. Body mass index is 36.27 kg/m.  Plan:   Problem List Items Addressed This Visit       Genitourinary   Fibroid uterus - Primary   Other Visit Diagnoses     Vulvar irritation       Dysuria           Follow up in one month. We encouraged her to take her analgesics as needed. No sexual activity.  We provided her with the pathology report.   Topical antifungals to the vulvar area.   UA and urine culture ordered to assess for UTI.    I personally saw the patient and performed this encounter in conjunction documented above.  Axxel Gude Gaetana Michaelis, MD

## 2021-03-18 LAB — URINE CULTURE: Culture: NO GROWTH

## 2021-04-06 ENCOUNTER — Encounter: Payer: Self-pay | Admitting: Obstetrics and Gynecology

## 2021-04-07 ENCOUNTER — Inpatient Hospital Stay: Payer: Self-pay | Admitting: Family Medicine

## 2021-04-19 ENCOUNTER — Encounter: Payer: Self-pay | Admitting: Obstetrics and Gynecology

## 2021-04-19 ENCOUNTER — Ambulatory Visit: Payer: Self-pay | Attending: Family Medicine

## 2021-04-19 ENCOUNTER — Other Ambulatory Visit: Payer: Self-pay

## 2021-04-19 ENCOUNTER — Ambulatory Visit (HOSPITAL_COMMUNITY)
Admission: EM | Admit: 2021-04-19 | Discharge: 2021-04-19 | Disposition: A | Discharge status: Home / Self Care | Payer: Self-pay | Attending: Internal Medicine | Admitting: Internal Medicine

## 2021-04-19 ENCOUNTER — Encounter (HOSPITAL_COMMUNITY): Payer: Self-pay | Admitting: Emergency Medicine

## 2021-04-19 ENCOUNTER — Telehealth (HOSPITAL_COMMUNITY): Payer: Self-pay | Admitting: Internal Medicine

## 2021-04-19 DIAGNOSIS — M545 Low back pain, unspecified: Secondary | ICD-10-CM

## 2021-04-19 MED ORDER — METHOCARBAMOL 500 MG PO TABS
500.0000 mg | ORAL_TABLET | Freq: Every evening | ORAL | 0 refills | Status: DC | PRN
  Filled 2021-04-19: qty 20, 20d supply, fill #0

## 2021-04-19 MED ORDER — HYDROCODONE-ACETAMINOPHEN 5-325 MG PO TABS
1.0000 | ORAL_TABLET | Freq: Four times a day (QID) | ORAL | 0 refills | Status: DC | PRN
Start: 2021-04-19 — End: 2021-04-19

## 2021-04-19 MED ORDER — HYDROCODONE-ACETAMINOPHEN 5-325 MG PO TABS
1.0000 | ORAL_TABLET | Freq: Four times a day (QID) | ORAL | 0 refills | Status: DC | PRN
Start: 2021-04-19 — End: 2021-04-19
  Filled 2021-04-19: qty 10, 3d supply, fill #0

## 2021-04-19 MED ORDER — HYDROCODONE-ACETAMINOPHEN 5-325 MG PO TABS: 1.0000 | ORAL_TABLET | Freq: Four times a day (QID) | ORAL | 0 refills | Status: DC | PRN

## 2021-04-19 NOTE — Discharge Instructions (Addendum)
Please take medications as prescribed Gentle range of motion exercises Heating pad use only 20 minutes on/20 minutes off cycle Return to urgent care if symptoms worsen.

## 2021-04-19 NOTE — Telephone Encounter (Signed)
Hydrocodone and Robaxin resent to pharmacy

## 2021-04-19 NOTE — ED Triage Notes (Addendum)
Left back pain.  Pain radiates upward.  Pain started 2 weeks ago and has been particularly bad this past week.  Denies fall.  Denies urinary issues.  Pain occurs with movement of body  Extensive triage determining name of medication patient is taking

## 2021-04-19 NOTE — Telephone Encounter (Signed)
Called pt to inform about Provider sending in medication to pharmacy.   No answer, left message.

## 2021-04-19 NOTE — ED Provider Notes (Signed)
[facility]    CSN: [id number] Arrival date & time: 04/19/21  1025      History   Chief Complaint Chief Complaint  Patient presents with   Back Pain    HPI Mariah Lang is a 51 y.o. female comes to urgent care with a 2-week history of left-sided low back pain.  Patient denies any fall or trauma to the back.  Pain is throbbing in nature.  Pain is currently 10 out of 10.  It is associated with movement but no known relieving factors.  Patient has tried topical ointment pain patches and oral medication with no improvement in his symptoms.  Pain radiates up her back and left side.  No dysuria urgency or frequency.  No flank pain.  No fever or chills.  No nausea or vomiting.  HPI  History reviewed. No pertinent past medical history.  There are no problems to display for this patient.   Past Surgical History:  Procedure Laterality Date   KIDNEY SURGERY     PARTIAL HYSTERECTOMY      OB History   No obstetric history on file.      Home Medications    Prior to Admission medications   Medication Sig Start Date End Date Taking? Authorizing Provider  Apixaban (ELIQUIS PO) Take by mouth.   Yes [provider]  HYDROcodone-acetaminophen (NORCO/VICODIN) 5-325 MG tablet Take 1 tablet by mouth every 6 (six) hours as needed. 04/19/21  Yes Candler Ginsberg, Myrene Galas, MD  methocarbamol (ROBAXIN) 500 MG tablet Take 1 tablet (500 mg total) by mouth at bedtime as needed for muscle spasms. 04/19/21  Yes Giara Mcgaughey, Myrene Galas, MD  NON FORMULARY Reports one prescribed medicine, but does not know the name of medicine   Yes [provider]    Family History Family History  Problem Relation Age of Onset   Migraines Mother    Hypertension Mother    Migraines Father    Hypertension Father     Social History Social History   Tobacco Use   Smoking status: Never   Smokeless tobacco: Never  Vaping Use   Vaping Use: Never used  Substance Use Topics   Alcohol use: Not  Currently   Drug use: Not Currently     Allergies   Patient has no known allergies.   Review of Systems Review of Systems As per HPI  Physical Exam Triage Vital Signs ED Triage Vitals  Enc Vitals Group     BP 04/19/21 1116 117/77     Pulse Rate 04/19/21 1116 67     Resp 04/19/21 1116 20     Temp 04/19/21 1116 97.8 F (36.6 C)     Temp Source 04/19/21 1116 Oral     SpO2 04/19/21 1116 98 %     Weight --      Height --      Head Circumference --      Peak Flow --      Pain Score 04/19/21 1108 10     Pain Loc --      Pain Edu? --      Excl. in [place]? --    No data found.  Updated Vital Signs BP 117/77 (BP Location: Right Arm) Comment (BP Location): large cuff   Pulse 67    Temp 97.8 F (36.6 C) (Oral)    Resp 20    SpO2 98%   Visual Acuity Right Eye Distance:   Left Eye Distance:   Bilateral Distance:  Right Eye Near:   Left Eye Near:    Bilateral Near:     Physical Exam Vitals and nursing note reviewed.  Constitutional:      General: She is not in acute distress.    Appearance: She is not ill-appearing.  Cardiovascular:     Rate and Rhythm: Normal rate and regular rhythm.     Pulses: Normal pulses.     Heart sounds: Normal heart sounds.  Pulmonary:     Effort: Pulmonary effort is normal.     Breath sounds: Normal breath sounds.  Abdominal:     General: Bowel sounds are normal.     Palpations: Abdomen is soft.     Comments: Surgical incisions well-healed.  Musculoskeletal:        General: Tenderness present. Normal range of motion.     Comments: Tenderness over the left sacroiliac joint.  No bruising noted.  Deep tendon reflexes 2+ in both lower extremities.  Neurological:     Mental Status: She is alert.     UC Treatments / Results  Labs (all labs ordered are listed, but only abnormal results are displayed) Labs Reviewed - No data to display  EKG   Radiology No results found.  Procedures Procedures (including critical care  time)  Medications Ordered in UC Medications - No data to display  Initial Impression / Assessment and Plan / UC Course  I have reviewed the triage vital signs and the nursing notes.  Pertinent labs & imaging results that were available during my care of the patient were reviewed by me and considered in my medical decision making (see chart for details).     1.  Acute left-sided low back pain without sciatica: Robaxin at bedtime-precautions given Hydrocodone-acetaminophen as needed for pain Heating pad use recommended Gentle range of motion exercises No indication for x-rays Return to urgent care if symptoms worsen. Final Clinical Impressions(s) / UC Diagnoses   Final diagnoses:  Acute left-sided low back pain without sciatica     Discharge Instructions      Please take medications as prescribed Gentle range of motion exercises Heating pad use only 20 minutes on/20 minutes off cycle Return to urgent care if symptoms worsen.   ED Prescriptions     Medication Sig Dispense Auth. Provider   methocarbamol (ROBAXIN) 500 MG tablet Take 1 tablet (500 mg total) by mouth at bedtime as needed for muscle spasms. 20 tablet Abbott Jasinski, Myrene Galas, MD   HYDROcodone-acetaminophen (NORCO/VICODIN) 5-325 MG tablet Take 1 tablet by mouth every 6 (six) hours as needed. 10 tablet Grayson White, Myrene Galas, MD   HYDROcodone-acetaminophen (NORCO/VICODIN) 5-325 MG tablet Take 1 tablet by mouth every 6 (six) hours as needed. 10 tablet Tacoya Altizer, Myrene Galas, MD      I have reviewed the PDMP during this encounter.   Chase Picket, MD 04/19/21 [phone]

## 2021-04-20 NOTE — Progress Notes (Signed)
04/21/21 10:58 AM   Mariah Lang 1970-05-08 [id number]  Referring provider:  Charlott Rakes, MD [address] [place],  [place] [address] Chief Complaint  Patient presents with   Follow-up     HPI: Mariah Lang is a 51 y.o.female with a personal history of left renal mass, who presents today for wound check.   CT abdomen and pelvis contrast on 11/15/2020 revealed a 10.7 cm left upper pole rena mass, compatible with solid renal neoplasm such as renal cell carcinoma. No findings were suspicious for metastatic disease. Additionally, uterine fibroids, including a dominant 14.5 cm subserosal fibroid in the left uterine fundus was identified.   12/18/2020 MRI of abdomen revealed 10.3 cm solid enhancing mass arising from the anterior mid pole of the left kidney, high suspicious for renal cell carcinoma, no evidence of abdominal metastatic disease, and large uterine fibroid enhancing the left abdomen.    CT of chest showed no evidence of metastatic disease or other significant abnormality within the thorax.    She is s/p hand-assisted laparoscopic left radical nephrectomy with me and total hysterectomy, minilaparotomy, myomectomy with Dr.Secord on 02/17/2021. 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    Post op course complicated by nonobstructing PE currently on eliquis x 3 months.  Returns today for 2 recheck to evaluate for clearance for work.    She was seen by Dr.Secord today for post-op check. She reported significant left low back/pelvic pain.  She was dx in ED with sacroiliitis/ sciatica.   Dr.Secord ordered an MRI of pelvic pain.   She is accompanied by a spanish interpretor today. She reports that he pain radiates down her leg that starts in the buttock. She reports it is worse when she moves around.  It starts in her sacroiliac joint area and radiates up to her flank and down her  leg.  PMH: Past Medical History:  Diagnosis Date   Anemia     Surgical History: Past Surgical History:  Procedure Laterality Date   APPENDECTOMY     CESAREAN SECTION  2000   KIDNEY SURGERY     LAPAROSCOPIC NEPHRECTOMY, HAND ASSISTED Left 02/17/2021   Procedure: HAND ASSISTED LAPAROSCOPIC RADICAL  NEPHRECTOMY;  Surgeon: Hollice Espy, MD;  Location: [facility];  Service: Urology;  Laterality: Left;   LAPAROTOMY  02/17/2021   Procedure: LAPAROTOMY OR HAND ASSIST;  Surgeon: Gillis Ends, MD;  Location: [facility];  Service: Gynecology;;  Fibroid excision    OTHER SURGICAL HISTORY  1993   c-section   PARTIAL HYSTERECTOMY     ROBOTIC ASSISTED LAPAROSCOPIC HYSTERECTOMY AND SALPINGECTOMY Bilateral 02/17/2021   Procedure: XI ROBOTIC ASSISTED LAPAROSCOPIC HYSTERECTOMY AND SALPINGECTOMY;  Surgeon: Gillis Ends, MD;  Location: [facility];  Service: Gynecology;  Laterality: Bilateral;    Home Medications:  Allergies as of 04/21/2021   No Known Allergies      Medication List        Accurate as of April 21, 2021 10:58 AM. If you have any questions, ask your nurse or doctor.          docusate sodium 100 MG capsule Commonly known as: Colace Take 1 capsule (100 mg total) by mouth 2 (two) times daily.   Eliquis 5 MG Tabs tablet Generic drug: apixaban Take 1 tablet (5 mg total) by mouth 2 (two) times daily. What changed: Another medication with the same name was removed. Continue taking this medication, and follow the directions you see here.  Changed by: [place] Gaetana Michaelis, MD   HYDROcodone-acetaminophen 5-325 MG tablet Commonly known as: NORCO/VICODIN Take 1 tablet by mouth every 6 (six) hours as needed.   methocarbamol 500 MG tablet Commonly known as: ROBAXIN Take 1 tablet (500 mg total) by mouth at bedtime as needed for muscle spasms.   NON FORMULARY Reports one prescribed medicine, but does not know the name of medicine        Allergies: No  Known Allergies  Family History: Family History  Problem Relation Age of Onset   Migraines Mother    Hypertension Mother    Migraines Father    Hypertension Father    Diabetes Mother    Diabetes Paternal Grandmother     Social History:  reports that she has never smoked. She has never used smokeless tobacco. She reports that she does not currently use alcohol. She reports that she does not currently use drugs.   Physical Exam: BP 115/77    Pulse 73    Ht 4\' 11"  (1.499 m)    Wt 189 lb 6.4 oz (85.9 kg)    LMP 01/18/2019    BMI 38.25 kg/m   Constitutional:  Alert and oriented, No acute distress. HEENT: [place] AT, moist mucus membranes.  Trachea midline, no masses. Cardiovascular: No clubbing, cyanosis, or edema. Respiratory: Normal respiratory effort, no increased work of breathing. GI: Abd incision well healed  with no further bruising or erythema.  Nontender. No concern for hernia  MSK: Tenderness over sacroiliac joint Neurologic: Grossly intact, no focal deficits, moving all 4 extremities. Psychiatric: Normal mood and affect.  Laboratory Data:  Lab Results  Component Value Date   CREATININE 0.75 03/04/2021   Lab Results  Component Value Date   HGBA1C 5.7 (A) 09/14/2020    Assessment & Plan:    Chromophobe Renal cell carcinoma   - S/p hand-assisted laparoscopic left radical nephrectomy - She is due for surveillance CT chest abdomen and pelvis in Mid May  - She is clear for physical activity from a surgical perspective  2. Left sacral iliac pain - Dr.Secord has ordered MRI to further evaluate this  -She was provided with a work note for 2 additional weeks out given her severe iliac pain awaiting MRI  Follow-up mid May with imaging  I,Kailey Littlejohn,acting as a scribe for Hollice Espy, MD.,have documented all relevant documentation on the behalf of Hollice Espy, MD,as directed by  Hollice Espy, MD while in the presence of Hollice Espy, MD.  I have reviewed  the above documentation for accuracy and completeness, and I agree with the above.   Hollice Espy, MD    [facility] [address], [place], [place] [address] [phone]

## 2021-04-21 ENCOUNTER — Ambulatory Visit (INDEPENDENT_AMBULATORY_CARE_PROVIDER_SITE_OTHER): Payer: Self-pay | Admitting: Urology

## 2021-04-21 ENCOUNTER — Encounter: Payer: Self-pay | Admitting: *Deleted

## 2021-04-21 ENCOUNTER — Inpatient Hospital Stay: Payer: Self-pay | Attending: Obstetrics and Gynecology | Admitting: Obstetrics and Gynecology

## 2021-04-21 ENCOUNTER — Other Ambulatory Visit: Payer: Self-pay

## 2021-04-21 VITALS — BP 116/73 | HR 75 | Temp 97.7°F | Resp 18 | Wt 189.0 lb

## 2021-04-21 VITALS — BP 115/77 | HR 73 | Ht 59.0 in | Wt 189.4 lb

## 2021-04-21 DIAGNOSIS — D259 Leiomyoma of uterus, unspecified: Secondary | ICD-10-CM | POA: Insufficient documentation

## 2021-04-21 DIAGNOSIS — M545 Low back pain, unspecified: Secondary | ICD-10-CM

## 2021-04-21 DIAGNOSIS — Z90710 Acquired absence of both cervix and uterus: Secondary | ICD-10-CM | POA: Insufficient documentation

## 2021-04-21 DIAGNOSIS — Z90722 Acquired absence of ovaries, bilateral: Secondary | ICD-10-CM | POA: Insufficient documentation

## 2021-04-21 DIAGNOSIS — C642 Malignant neoplasm of left kidney, except renal pelvis: Secondary | ICD-10-CM

## 2021-04-21 NOTE — Progress Notes (Signed)
Gynecologic Oncology Postop Visit   Referring Provider: Dr. Erlene Quan  Chief Complaint: Postop visit  Subjective:  Mariah Lang is a 51 y.o. G2P2 female who is seen in consultation from Dr. Erlene Quan for uterine fibroids in setting of renal mass.  At her last visit she had a UA and culture that was negative for infection. She was treated with topical antifungals to the vulvar area for irritation.   She presents for postop check. She is having significant left low back/pelvic pain for the last 2-3 weeks. Marland Kitchen She was seen at Urgent Care and given Robaxin. The muscle relaxer has helped her pain slightly. She has no other complaints.   On 02/17/2021 she underwent Exam under anesthesia, minilaparotomy, myomectomy, robotic assisted total hysterectomy and bilateral salpingo-oophorectomy, and lysis of adhesions >45 minutes. Dr. Erlene Quan did a Hand-assisted laparoscopic left radical nephrectomy.  Pathology:  DIAGNOSIS:  A. UTERUS; MYOMECTOMY:  - LEIOMYOMA UTERI.  - NEGATIVE FOR FEATURES OF MALIGNANCY.   B. UTERUS WITH CERVIX BILATERAL FALLOPIAN TUBES AND OVARIES; TOTAL  HYSTERECTOMY WITH BILATERAL SALPINGO-OOPHORECTOMY:  - UTERINE CERVIX:       - BENIGN TRANSFORMATION ZONE.       - NEGATIVE FOR SQUAMOUS INTRAEPITHELIAL LESION AND MALIGNANCY.  - ENDOMETRIUM:       - WEAKLY PROLIFERATIVE ENDOMETRIUM.       - NEGATIVE FOR ATYPICAL HYPERPLASIA/EIN AND MALIGNANCY.  - MYOMETRIUM:       - LEIOMYOMATA UTERI.       - NEGATIVE FOR FEATURES OF MALIGNANCY.  - UTERINE SEROSA:       - FIBROUS SEROSAL ADHESIONS.  - FALLOPIAN TUBES:       - DILATED RIGHT FALLOPIAN TUBE, COMPATIBLE WITH HEMATOSALPINX.       - OTHERWISE NO SIGNIFICANT HISTOPATHOLOGIC CHANGE.  - OVARIES:       - BENIGN PHYSIOLOGIC CHANGES, INCLUDING BENIGN SIMPLE CYST.       - NEGATIVE FOR MALIGNANCY.   C. 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  - SEE  CANCER SUMMARY   She presents to clinic today with an interpreter.   Gynecologic History:  Mariah Lang is a pleasant G96P2 female who is seen in consultation from Dr. Sidni Rana for uterine fibroids in setting of renal mass.  She states she has at least a 6-7 year history of uterine fibroids. Her fibroids are palpable above her umbilicus. In the past they were 2-4 cm and then over the last two years have grown rapidly. She does have pelvic "cramping" pain like a menstrual cycle. She has bleeding monthly but it is much less than when she had regular cycles. She does not know if she has hot flashes, but feels like she has them now due to an antibiotic she is taking. She does not recall when she had prior imaging before the CT scan.   Patient presented to ED on 11/15/20 for LLQ abdominal pain. CT revealed 10.7 cm left upper pole renal mass compatible with solid renal neoplasm such as RCC. Additionally, uterine fibroids including a dominant 14.5 cm subserosal fibroid in the left uterine fundus was identified. She has abnormal uterine bleeding which has been managed by Dr. Ilda Basset, previously receiving injections but no longer taking d/t cost. Patient saw Dr. Erlene Quan on 11/25/20 for evaluation of renal mass. Nephrectomy was recommended. Patient referred to gyn-onc to evaluate for possible combined GU and GYN surgical case and determine if minimally invasive approach is possibility.  12/02/2020  Pap NILM/HRHPV negative  12/04/2020 FSH 60.5  12/19/20- MR Pelvis W Wo Contrast Markedly enlarged uterus with numerous fibroids. Dominant 12.9 cm pedunculated fibroid extends from the left uterine fundus into the left abdomen, but has a thick soft tissue pedicle of attachment. No intracavitary fibroids identified.   10.8 cm complex cystic lesion in the left adnexa, which is indeterminate. Its elongated/tubular configuration suggests the possibility of a cystic ovarian lesion combined with an  adjacent hydrosalpinx,; malignancy cannot be excluded. Recommend correlation with tumor markers, and consider surgical evaluation versus follow-up by MRI in 6 weeks.  12/19/20- MR Abdomen W WO Contrast 10.3 cm solid enhancing mass arising from the anterior midpole of the left kidney, highly suspicious for renal cell carcinoma.  No evidence of abdominal metastatic disease. Cholelithiasis. No radiographic evidence of cholecystitis or biliary ductal dilatation. Large uterine fibroid extending into the left abdomen. See separate report for pelvis MRI without and with contrast.     Problem List: Patient Active Problem List   Diagnosis Date Noted   Hyponatremia 02/20/2021   Aspiration pneumonia of both lower lobes due to gastric secretions ([place]) 02/19/2021   Pulmonary embolism ([place]) 02/19/2021   Acute hypoxemic respiratory failure ([place]) 02/19/2021   Renal cell carcinoma ([place]) 02/17/2021   Ankle edema, bilateral 09/14/2020   Vitamin D deficiency 09/14/2020   Thrombocytosis 05/11/2019   Language barrier 12/31/2018   Screening breast examination 12/04/2018   Fibroid uterus 12/18/2014   Menorrhagia, premenopausal 11/24/2014   Iron deficiency anemia 11/24/2014   Iron deficiency anemia due to chronic blood loss 09/02/2014   Pap smear for cervical cancer screening 09/02/2014   Annual physical exam 09/02/2014   Dysmenorrhea 02/15/2013   Anemia due to Dysmenorrhea 02/15/2013   Past Medical History: Past Medical History:  Diagnosis Date   Anemia    Past Surgical History: Past Surgical History:  Procedure Laterality Date   APPENDECTOMY     CESAREAN SECTION  2000   KIDNEY SURGERY     LAPAROSCOPIC NEPHRECTOMY, HAND ASSISTED Left 02/17/2021   Procedure: HAND ASSISTED LAPAROSCOPIC RADICAL  NEPHRECTOMY;  Surgeon: Hollice Espy, MD;  Location: [facility];  Service: Urology;  Laterality: Left;   LAPAROTOMY  02/17/2021   Procedure: LAPAROTOMY OR HAND ASSIST;  Surgeon: Gillis Ends,  MD;  Location: [facility];  Service: Gynecology;;  Fibroid excision    OTHER SURGICAL HISTORY  1993   c-section   PARTIAL HYSTERECTOMY     ROBOTIC ASSISTED LAPAROSCOPIC HYSTERECTOMY AND SALPINGECTOMY Bilateral 02/17/2021   Procedure: XI ROBOTIC ASSISTED LAPAROSCOPIC HYSTERECTOMY AND SALPINGECTOMY;  Surgeon: Gillis Ends, MD;  Location: [facility];  Service: Gynecology;  Laterality: Bilateral;   Past Gynecologic History:  Menarche: 15 Menstrual details: irregular Menses regular: no Last Menstrual Period: 2020 History of OCP/HRT use:  History of Abnormal pap:  Last pap:  2016 History of STDs:  Contraception: no Sexually active:   OB History  Gravida Para Term Preterm AB Living  2 2 2  0 0    SAB IAB Ectopic Multiple Live Births  0 0 0        # Outcome Date GA Lbr Len/2nd Weight Sex Delivery Anes PTL Lv  2 Term      CS-Unspec     1 Term      CS-Unspec      Family History: Family History  Problem Relation Age of Onset   Migraines Mother    Hypertension Mother    Migraines Father    Hypertension Father  Diabetes Mother    Diabetes Paternal Grandmother    Social History: Social History   Socioeconomic History   Marital status: Divorced    Spouse name: Not on file   Number of children: 2   Years of education: Not on file   Highest education level: 9th grade  Occupational History   Not on file  Tobacco Use   Smoking status: Never   Smokeless tobacco: Never  Vaping Use   Vaping Use: Never used  Substance and Sexual Activity   Alcohol use: Not Currently   Drug use: Not Currently   Sexual activity: Yes    Birth control/protection: Surgical, None  Other Topics Concern   Not on file  Social History Narrative   ** Merged History Encounter **       ** Merged History Encounter **       Social Determinants of Health   Financial Resource Strain: Not on file  Food Insecurity: Not on file  Transportation Needs: Not on file  Physical Activity: Not on file   Stress: Not on file  Social Connections: Not on file  Intimate Partner Violence: Not on file   Allergies: No Known Allergies  Current Medications: Current Outpatient Medications  Medication Sig Dispense Refill   apixaban (ELIQUIS) 5 MG TABS tablet Take 1 tablet (5 mg total) by mouth 2 (two) times daily. 60 tablet 2   HYDROcodone-acetaminophen (NORCO/VICODIN) 5-325 MG tablet Take 1 tablet by mouth every 6 (six) hours as needed. 10 tablet 0   methocarbamol (ROBAXIN) 500 MG tablet Take 1 tablet (500 mg total) by mouth at bedtime as needed for muscle spasms. 20 tablet 0   docusate sodium (COLACE) 100 MG capsule Take 1 capsule (100 mg total) by mouth 2 (two) times daily. 30 capsule 0   NON FORMULARY Reports one prescribed medicine, but does not know the name of medicine     No current facility-administered medications for this visit.   Review of Systems  General: no complaints  HEENT: no complaints  Lungs: no complaints  Cardiac: no complaints  GI: no complaints  GU: no complaints  Musculoskeletal: back pain o/w no complaints  Extremities: no complaints  Skin: no complaints  Neuro: no complaints  Endocrine: no complaints  Psych: no complaints        Objective:  Physical Examination:  BP 116/73 (BP Location: Left Arm, Patient Position: Sitting)    Pulse 75    Temp 97.7 F (36.5 C) (Tympanic)    Resp 18    Wt 189 lb (85.7 kg)    LMP 01/18/2019    SpO2 98%    BMI 38.17 kg/m     ECOG Performance Status: 1 GENERAL: Patient is a well appearing female in no acute distress but visibly in pain with movement HEENT:  PERRL, neck supple with midline trachea. Thyroid without masses.  ABDOMEN:  Soft, nontender, nondistended. No hernia.  INCISIONS: Intact without evidence of erythema or drainage.  MSK:  No focal spinal tenderness to palpation. Very tender to palpation low left/upper buttock pain in the sacroiliac joint.  EXTREMITIES:  No peripheral edema.   SKIN:  Clear with no obvious  rashes or skin changes. No nail dyscrasia. NEURO:  Nonfocal. Well oriented.  Appropriate affect.  Pelvic: chaperoned by RN EGBUS: no lesions Cervix: absent Vagina: no lesions, no discharge or bleeding. Sutures still present.  Uterus: absent BME: no palpable masses but limited by back pain         Assessment:  Mariah Lang is a 51 y.o. female diagnosed with enlarging symptomatic uterine fibroids and renal mass. Differential for the uterine fibroids includes leiomyosarcoma given rapid enlargement over the past 2 years s/p definitive surgery. Pathology benign. Vaginal cuff still healing with sutures intact.   Complex cystic lesion in the left adnexa - HEMATOSALPINX  Back pain sacroiliac joint  Medical co-morbidities complicating care: prior C-section x 2. Body mass index is 38.17 kg/m.  Plan:   Problem List Items Addressed This Visit       Genitourinary   Fibroid uterus   Other Visit Diagnoses     Left-sided low back pain without sciatica, unspecified chronicity    -  Primary   Relevant Orders   MR PELVIS W WO CONTRAST        Follow up in one month. We encouraged her to take her analgesics as needed. No sexual activity.   Back pain of uncertain etiology. Extent of pain is concerning. Doubt secondary to surgery given time frame. Recommend pelvic MRI to assess further. If negative for postop issue then recommended she follow up with her PCP for back pain and management which may include PT.     I personally saw the patient and performed this encounter in conjunction documented above.  Ulani Degrasse Gaetana Michaelis, MD

## 2021-04-30 ENCOUNTER — Ambulatory Visit
Admission: RE | Admit: 2021-04-30 | Discharge: 2021-04-30 | Disposition: A | Discharge status: Home / Self Care | Payer: Self-pay | Source: Ambulatory Visit | Attending: Obstetrics and Gynecology | Admitting: Obstetrics and Gynecology

## 2021-04-30 ENCOUNTER — Other Ambulatory Visit: Payer: Self-pay

## 2021-04-30 DIAGNOSIS — M545 Low back pain, unspecified: Secondary | ICD-10-CM | POA: Insufficient documentation

## 2021-04-30 MED ORDER — GADOBUTROL 1 MMOL/ML IV SOLN
8.0000 mL | Freq: Once | INTRAVENOUS | Status: AC | PRN
  Administered 2021-04-30: 8 mL via INTRAVENOUS

## 2021-05-03 ENCOUNTER — Telehealth: Payer: Self-pay

## 2021-05-03 NOTE — Telephone Encounter (Signed)
[facility] with the results of her pelvic MRI. Per Dr. Theora Gianotti if negative scan she will need to see her PCP for her back pain. This was advised to [facility]. She also shared that, on her own, she stopped taking her Eliquis to see if that was causing her back pain. 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 She states she will start it back and she was off of it since last week. She asked about going back to work and follow up with urology. Per Dr. Cherrie Gauze note she cleared her for physical activity from a surgery perspective. She gave her an additional two weeks related to the back pain she was having and she was to undergo an MRI. Notified that she has urology follow up in May with repeat imaging for surveillance.

## 2021-05-26 ENCOUNTER — Inpatient Hospital Stay: Payer: Self-pay | Attending: Obstetrics and Gynecology | Admitting: Obstetrics and Gynecology

## 2021-05-26 ENCOUNTER — Other Ambulatory Visit: Payer: Self-pay

## 2021-05-26 VITALS — BP 130/90 | HR 78 | Temp 98.7°F | Resp 19 | Wt 193.5 lb

## 2021-05-26 DIAGNOSIS — Z90722 Acquired absence of ovaries, bilateral: Secondary | ICD-10-CM | POA: Insufficient documentation

## 2021-05-26 DIAGNOSIS — M545 Low back pain, unspecified: Secondary | ICD-10-CM | POA: Insufficient documentation

## 2021-05-26 DIAGNOSIS — D259 Leiomyoma of uterus, unspecified: Secondary | ICD-10-CM | POA: Insufficient documentation

## 2021-05-26 DIAGNOSIS — C642 Malignant neoplasm of left kidney, except renal pelvis: Secondary | ICD-10-CM | POA: Insufficient documentation

## 2021-05-26 DIAGNOSIS — Z90710 Acquired absence of both cervix and uterus: Secondary | ICD-10-CM | POA: Insufficient documentation

## 2021-05-26 DIAGNOSIS — R102 Pelvic and perineal pain: Secondary | ICD-10-CM | POA: Insufficient documentation

## 2021-05-26 DIAGNOSIS — Z905 Acquired absence of kidney: Secondary | ICD-10-CM | POA: Insufficient documentation

## 2021-05-26 NOTE — Progress Notes (Signed)
Gynecologic Oncology Postop Visit   Referring Provider: Dr. Erlene Quan  Chief Complaint: Follow up status post myomectomy, robotic assisted total hysterectomy and bilateral salpingo-oophorectomy  Subjective:  Sreya Froio is a 51 y.o. G2P2 female who is seen in consultation from Dr. Erlene Quan for uterine fibroids in setting of renal mass.   She presents for final postop check. She is still having significant left low back/pelvic pain with no clear etiology. She also has not seen her PCP or started taking her medications.   We ordered a pelvic MRI her last visit and that was negative for an etiology of her pain.   04/30/2021 IMPRESSION: Hysterectomy and surgical resection of left adnexal mass demonstrated since prior study.   No evidence of pelvic mass, fluid collections, or other significant abnormality.     She presents to clinic today with an interpreter.   Gynecologic History:  Cheryln Balcom is a pleasant G29P2 female who is seen in consultation from Dr. Vivianna Rana for uterine fibroids in setting of renal mass.  She states she has at least a 6-7 year history of uterine fibroids. Her fibroids are palpable above her umbilicus. In the past they were 2-4 cm and then over the last two years have grown rapidly. She does have pelvic "cramping" pain like a menstrual cycle. She has bleeding monthly but it is much less than when she had regular cycles. She does not know if she has hot flashes, but feels like she has them now due to an antibiotic she is taking. She does not recall when she had prior imaging before the CT scan.   Patient presented to ED on 11/15/20 for LLQ abdominal pain. CT revealed 10.7 cm left upper pole renal mass compatible with solid renal neoplasm such as RCC. Additionally, uterine fibroids including a dominant 14.5 cm subserosal fibroid in the left uterine fundus was identified. She has abnormal uterine bleeding which has been managed by Dr.  Ilda Basset, previously receiving injections but no longer taking d/t cost. Patient saw Dr. Erlene Quan on 11/25/20 for evaluation of renal mass. Nephrectomy was recommended. Patient referred to gyn-onc to evaluate for possible combined GU and GYN surgical case and determine if minimally invasive approach is possibility.    12/02/2020  Pap NILM/HRHPV negative  12/04/2020 FSH 60.5  12/19/20- MR Pelvis W Wo Contrast Markedly enlarged uterus with numerous fibroids. Dominant 12.9 cm pedunculated fibroid extends from the left uterine fundus into the left abdomen, but has a thick soft tissue pedicle of attachment. No intracavitary fibroids identified.   10.8 cm complex cystic lesion in the left adnexa, which is indeterminate. Its elongated/tubular configuration suggests the possibility of a cystic ovarian lesion combined with an adjacent hydrosalpinx,; malignancy cannot be excluded. Recommend correlation with tumor markers, and consider surgical evaluation versus follow-up by MRI in 6 weeks.  12/19/20- MR Abdomen W WO Contrast 10.3 cm solid enhancing mass arising from the anterior midpole of the left kidney, highly suspicious for renal cell carcinoma.  No evidence of abdominal metastatic disease. Cholelithiasis. No radiographic evidence of cholecystitis or biliary ductal dilatation. Large uterine fibroid extending into the left abdomen. See separate report for pelvis MRI without and with contrast.  On 02/17/2021 she underwent Exam under anesthesia, minilaparotomy, myomectomy, robotic assisted total hysterectomy and bilateral salpingo-oophorectomy, and lysis of adhesions >45 minutes. Dr. Erlene Quan did a Hand-assisted laparoscopic left radical nephrectomy.  Pathology:  DIAGNOSIS:  A. UTERUS; MYOMECTOMY:  - LEIOMYOMA UTERI.  - NEGATIVE FOR FEATURES OF MALIGNANCY.   B.  UTERUS WITH CERVIX BILATERAL FALLOPIAN TUBES AND OVARIES; TOTAL  HYSTERECTOMY WITH BILATERAL SALPINGO-OOPHORECTOMY:  - UTERINE CERVIX:       -  BENIGN TRANSFORMATION ZONE.       - NEGATIVE FOR SQUAMOUS INTRAEPITHELIAL LESION AND MALIGNANCY.  - ENDOMETRIUM:       - WEAKLY PROLIFERATIVE ENDOMETRIUM.       - NEGATIVE FOR ATYPICAL HYPERPLASIA/EIN AND MALIGNANCY.  - MYOMETRIUM:       - LEIOMYOMATA UTERI.       - NEGATIVE FOR FEATURES OF MALIGNANCY.  - UTERINE SEROSA:       - FIBROUS SEROSAL ADHESIONS.  - FALLOPIAN TUBES:       - DILATED RIGHT FALLOPIAN TUBE, COMPATIBLE WITH HEMATOSALPINX.       - OTHERWISE NO SIGNIFICANT HISTOPATHOLOGIC CHANGE.  - OVARIES:       - BENIGN PHYSIOLOGIC CHANGES, INCLUDING BENIGN SIMPLE CYST.       - NEGATIVE FOR MALIGNANCY.   C. 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  - SEE CANCER SUMMARY   Problem List: Patient Active Problem List   Diagnosis Date Noted   Hyponatremia 02/20/2021   Aspiration pneumonia of both lower lobes due to gastric secretions ([place]) 02/19/2021   Pulmonary embolism ([place]) 02/19/2021   Acute hypoxemic respiratory failure ([place]) 02/19/2021   Renal cell carcinoma ([place]) 02/17/2021   Ankle edema, bilateral 09/14/2020   Vitamin D deficiency 09/14/2020   Thrombocytosis 05/11/2019   Language barrier 12/31/2018   Screening breast examination 12/04/2018   Fibroid uterus 12/18/2014   Menorrhagia, premenopausal 11/24/2014   Iron deficiency anemia 11/24/2014   Iron deficiency anemia due to chronic blood loss 09/02/2014   Pap smear for cervical cancer screening 09/02/2014   Annual physical exam 09/02/2014   Dysmenorrhea 02/15/2013   Anemia due to Dysmenorrhea 02/15/2013   Past Medical History: Past Medical History:  Diagnosis Date   Anemia    Past Surgical History: Past Surgical History:  Procedure Laterality Date   APPENDECTOMY     CESAREAN SECTION  2000   KIDNEY SURGERY     LAPAROSCOPIC NEPHRECTOMY, HAND ASSISTED Left 02/17/2021   Procedure: HAND ASSISTED LAPAROSCOPIC RADICAL  NEPHRECTOMY;  Surgeon:  Hollice Espy, MD;  Location: [facility];  Service: Urology;  Laterality: Left;   LAPAROTOMY  02/17/2021   Procedure: LAPAROTOMY OR HAND ASSIST;  Surgeon: Gillis Ends, MD;  Location: [facility];  Service: Gynecology;;  Fibroid excision    OTHER SURGICAL HISTORY  1993   c-section   PARTIAL HYSTERECTOMY     ROBOTIC ASSISTED LAPAROSCOPIC HYSTERECTOMY AND SALPINGECTOMY Bilateral 02/17/2021   Procedure: XI ROBOTIC ASSISTED LAPAROSCOPIC HYSTERECTOMY AND SALPINGECTOMY;  Surgeon: Gillis Ends, MD;  Location: [facility];  Service: Gynecology;  Laterality: Bilateral;   Past Gynecologic History:  Menarche: 15 Menstrual details: irregular Menses regular: no Last Menstrual Period: 2020 History of OCP/HRT use:  History of Abnormal pap:  Last pap:  2016 History of STDs:  Contraception: no Sexually active:   OB History  Gravida Para Term Preterm AB Living  2 2 2  0 0    SAB IAB Ectopic Multiple Live Births  0 0 0        # Outcome Date GA Lbr Len/2nd Weight Sex Delivery Anes PTL Lv  2 Term      CS-Unspec     1 Term      CS-Unspec      Family History: Family History  Problem Relation  Age of Onset   Migraines Mother    Hypertension Mother    Migraines Father    Hypertension Father    Diabetes Mother    Diabetes Paternal Grandmother    Social History: Social History   Socioeconomic History   Marital status: Divorced    Spouse name: Not on file   Number of children: 2   Years of education: Not on file   Highest education level: 9th grade  Occupational History   Not on file  Tobacco Use   Smoking status: Never   Smokeless tobacco: Never  Vaping Use   Vaping Use: Never used  Substance and Sexual Activity   Alcohol use: Not Currently   Drug use: Not Currently   Sexual activity: Yes    Birth control/protection: Surgical, None  Other Topics Concern   Not on file  Social History Narrative   ** Merged History Encounter **       ** Merged History Encounter **        Social Determinants of Health   Financial Resource Strain: Not on file  Food Insecurity: Not on file  Transportation Needs: Not on file  Physical Activity: Not on file  Stress: Not on file  Social Connections: Not on file  Intimate Partner Violence: Not on file   Allergies: No Known Allergies   Review of Systems  General: no complaints  HEENT: no complaints  Lungs: no complaints  Cardiac: no complaints  GI: no complaints  GU: no complaints  Musculoskeletal: back pain o/w no complaints  Extremities: no complaints  Skin: no complaints  Neuro: no complaints  Endocrine: no complaints  Psych: no complaints        Objective:  Physical Examination:  BP 130/90    Pulse 78    Temp 98.7 F (37.1 C)    Resp 19    Wt 193 lb 8 oz (87.8 kg)    LMP 01/18/2019    SpO2 98%    BMI 39.08 kg/m     GENERAL: Patient is a well appearing female in no acute distress ABDOMEN:  Soft, nontender.  Positive, normoactive bowel sounds.  MSK:  Pain left flank with palpation  Pelvic: chaperoned by CMA EGBUS: no lesions Cervix: absent Vagina: no lesions, no discharge or bleeding. Suture material still present. Excellent healing. Uterus: absent BME: no palpable masses       Assessment:  Remie Mathison is a 51 y.o. female diagnosed with enlarging symptomatic uterine fibroids and renal mass. Differential for the uterine fibroids includes leiomyosarcoma given rapid enlargement over the past 2 years s/p definitive surgery. Pathology benign. Vaginal cuff intact and healing well.   Complex cystic lesion in the left adnexa - HEMATOSALPINX  No clear etiology of back pain  Medical co-morbidities complicating care: prior C-section x 2. Body mass index is 39.08 kg/m.  Plan:   Problem List Items Addressed This Visit   None Visit Diagnoses     Left-sided low back pain without sciatica, unspecified chronicity    -  Primary      Leiomyoma with excellent healing.  Release from clinic at this point. She is not sexually active.   Back pain of uncertain etiology. Pelvic MRI negative. We recommended she follow up with her PCP for back pain and management. We did provide a PT consult today. We also recommended she take her medications and reach out to her PCP to get assistance asap. Mariea Clonts, RN Navigator will also try to help her  get the Eliquis refilled as we are concerned about her risk of recurrent VTE.     I personally saw the patient and performed this encounter in conjunction documented above.  Sueo Cullen Gaetana Michaelis, MD

## 2021-05-27 ENCOUNTER — Other Ambulatory Visit: Payer: Self-pay

## 2021-05-27 DIAGNOSIS — M545 Low back pain, unspecified: Secondary | ICD-10-CM

## 2021-05-28 ENCOUNTER — Other Ambulatory Visit: Payer: Self-pay

## 2021-05-28 ENCOUNTER — Telehealth: Payer: Self-pay | Admitting: Nurse Practitioner

## 2021-05-28 DIAGNOSIS — D259 Leiomyoma of uterus, unspecified: Secondary | ICD-10-CM

## 2021-05-28 DIAGNOSIS — Z5181 Encounter for therapeutic drug level monitoring: Secondary | ICD-10-CM

## 2021-05-28 NOTE — Telephone Encounter (Signed)
Followed up re: patient's eliquis and Dr. Gershon Crane recommendation to have patient complete 3 months of medication. Patient is poor historian regarding history of medication and status. Ir eached out to her pcp, Dr. Kimbely Rana. Per Dr. Breean Rana, patient had picked up one month of medication but never picked up refills and those refills are currently available at patient's pharmacy. I have requested that nursing reach out to patient to encourage her to pick up refills and take additional 2 months of eliquis as recommended. Dr. Eloisa Rana will follow up with patient at her next appointment.

## 2021-05-28 NOTE — Telephone Encounter (Signed)
Reached out to patient and informed her to resume on Eliquis, per Ander Purpura, NP. Also informed her that she had additional refills at her pharmacy and if she had any other questions she could give [place] a call back. Patient verbalized understanding.

## 2021-06-02 ENCOUNTER — Other Ambulatory Visit: Payer: Self-pay

## 2021-06-02 NOTE — Therapy (Signed)
OUTPATIENT PHYSICAL THERAPY THORACOLUMBAR EVALUATION   Patient Name: Mariah Lang MRN: [id number] DOB:11-Dec-1970, 51 y.o., female Today's Date: 06/03/2021   PT End of Session - 06/03/21 1313     Visit Number 1    Number of Visits 13    Date for PT Re-Evaluation 07/17/21    Authorization Type CAFA    PT Start Time 1310    PT Stop Time 7001    PT Time Calculation (min) 49 min    Activity Tolerance Patient limited by pain    Behavior During Therapy [facility] for tasks assessed/performed             Past Medical History:  Diagnosis Date   Anemia    Past Surgical History:  Procedure Laterality Date   APPENDECTOMY     CESAREAN SECTION  2000   KIDNEY SURGERY     LAPAROSCOPIC NEPHRECTOMY, HAND ASSISTED Left 02/17/2021   Procedure: HAND ASSISTED LAPAROSCOPIC RADICAL  NEPHRECTOMY;  Surgeon: Hollice Espy, MD;  Location: [facility] ORS;  Service: Urology;  Laterality: Left;   LAPAROTOMY  02/17/2021   Procedure: LAPAROTOMY OR HAND ASSIST;  Surgeon: Gillis Ends, MD;  Location: [facility] OR[facility];  Service: Gynecology;;  Fibroid excision    OTHER SURGICAL HISTORY  1993   c-section   PARTIAL HYSTERECTOMY     ROBOTIC ASSISTED LAPAROSCOPIC HYSTERECTOMY AND SALPINGECTOMY Bilateral 02/17/2021   Procedure: XI ROBOTIC ASSISTED LAPAROSCOPIC HYSTERECTOMY AND SALPINGECTOMY;  Surgeon: Gillis Ends, MD;  Location: [facility] OR[facility];  Service: Gynecology;  Laterality: Bilateral;   Patient Active Problem List   Diagnosis Date Noted   Hyponatremia 02/20/2021   Aspiration pneumonia of both lower lobes due to gastric secretions ([place]) 02/19/2021   Pulmonary embolism ([place]) 02/19/2021   Acute hypoxemic respiratory failure ([place]) 02/19/2021   Renal cell carcinoma ([place]) 02/17/2021   Ankle edema, bilateral 09/14/2020   Vitamin D deficiency 09/14/2020   Thrombocytosis 05/11/2019   Language barrier 12/31/2018   Screening breast examination 12/04/2018   Fibroid uterus 12/18/2014    Menorrhagia, premenopausal 11/24/2014   Iron deficiency anemia 11/24/2014   Iron deficiency anemia due to chronic blood loss 09/02/2014   Pap smear for cervical cancer screening 09/02/2014   Annual physical exam 09/02/2014   Dysmenorrhea 02/15/2013   Anemia due to Dysmenorrhea 02/15/2013    PCP: System, Provider Not In  REFERRING PROVIDER: [place], [place]: M54.50 (ICD-10-CM) - Left-sided low back pain without sciatica, unspecified chronicity   THERAPY DIAG:  Chronic left-sided low back pain without sciatica  Pain in left hip  Muscle weakness (generalized)  Abnormal posture  ONSET DATE: 02/17/21  SUBJECTIVE:  SUBJECTIVE STATEMENT:interpreter present  Patient reports left low back/hip pain that began a few weeks following her nephrectomy, hysterectomy, and salpingectomy on 02/17/21 secondary to renal mass and uterine fibroids. She reports the pain is getting worse. She feels the pain is superficial. She denies having this type of pain prior to her surgical procedure. No changes in bowel/bladder. She denies any numbness/tingling. She reports returning to work in February, though prior to this was fairly sedentary following her surgical procedure.  PERTINENT HISTORY:  Left renal mass and and uterine fibroids s/p LAPAROSCOPIC HYSTERECTOMY AND SALPINGECTOMY HAND ASSISTED LAPAROSCOPIC RADICAL  NEPHRECTOMY on 02/17/2021  PAIN:  Are you having pain? Yes NPRS scale: 8/10 Pain location: left low back, posterior hip   PAIN TYPE: cramping, stabbing  Pain description: intermittent  Aggravating factors: prolonged sitting, laying on the left side   Relieving factors: medication, laying on the right side   PRECAUTIONS: None  WEIGHT BEARING RESTRICTIONS No  FALLS:  Has patient fallen  in last 6 months? No, Number of falls: 0  LIVING ENVIRONMENT: Lives with: lives with their family Lives in: House/apartment Stairs: Yes; 3 stairs to enter  Has following equipment at home: None  OCCUPATION: hotel housekeeping   PLOF: Independent  PATIENT GOALS "for the pain to go away."   OBJECTIVE:   DIAGNOSTIC FINDINGS:  Pelvic MRI:   IMPRESSION: Hysterectomy and surgical resection of left adnexal mass demonstrated since prior study.   No evidence of pelvic mass, fluid collections, or other significant abnormality.  PATIENT SURVEYS:  FOTO 28% to 53%  SCREENING FOR RED FLAGS: Bowel or bladder incontinence: No Cauda equina syndrome: No   COGNITION:  Overall cognitive status: Within functional limits for tasks assessed     SENSATION:  Light touch: not assessed   POSTURE:  Slump sitting posture; shifts positions in sitting throughout evaluation due to pain.   PALPATION: Hypersensitive to palpation of Lt gluteals, piriformis, lumbar paraspinals Hypomobility L-spine   LUMBARAROM/PROM  A/PROM A/PROM  06/03/2021  Flexion 25% limited increased LBP  Extension WNL  Right lateral flexion 50% limited LBP  Left lateral flexion WNL  Right rotation 50% limited   Left rotation 50% limited    (Blank rows = not tested)  LE AROM/PROM: hip PROM WNL bilaterally, though increased pain with flexion, IR, and ER bilaterally   A/PROM Right 06/03/2021 Left 06/03/2021  Hip flexion    Hip extension    Hip abduction    Hip adduction    Hip internal rotation    Hip external rotation    Knee flexion    Knee extension    Ankle dorsiflexion    Ankle plantarflexion    Ankle inversion    Ankle eversion     (Blank rows = not tested)  LE MMT:  MMT Right 06/03/2021 Left 06/03/2021  Hip flexion 5/5 4-/5 LBP  Hip extension Unable to complete prone hip extension due to increased LBP Unable to complete prone hip extension due to increased LBP  Hip abduction 4-/5 4-/5   Hip adduction     Hip internal rotation    Hip external rotation    Knee flexion    Knee extension    Ankle dorsiflexion    Ankle plantarflexion    Ankle inversion    Ankle eversion     (Blank rows = not tested)  LUMBAR SPECIAL TESTS:  (-) SLR (+) FABER (+) Gaenslen's (-) Sacral Thrust (-) SI Compression/Distraction   FUNCTIONAL TESTS:  N/A  GAIT: Not formally assessed  TODAY'S TREATMENT  [facility] Adult PT Treatment:                                                DATE: 06/03/21  Therapeutic Activity: Education on assessment findings that will be addressed throughout duration of POC.       PATIENT EDUCATION:  Education details: see treatment  Person educated: Patient, interpreter  Education method: Explanation Education comprehension: verbalized understanding   HOME EXERCISE PROGRAM: No time at eval to issue   ASSESSMENT:  CLINICAL IMPRESSION: Patient is a 51 y.o. female who was seen today for physical therapy evaluation and treatment for left low back/posterior hip pain that began following nephrectomy, hysterectomy, and salpingectomy on 02/17/22 secondary to left renal mass and uterine fibroids. She has limited lumbar AROM with LBP elicited with lumbar flexion and right lateral flexion. She has bilateral hip weakness, though most significant weakness present in hip extensors secondary to significant increase in pain when she attempts to perform prone hip extension bilaterally. She has significant TTP along Lt lumbar paraspinals, gluteals, piriformis, and SI joint. SI joint special testing is inconclusive as FABER and Gaenslen's are positive, though SI compression/distraction, and sacral thrust are negative. She will benefit from skilled PT to address the above stated deficits in order to optimize her function.    OBJECTIVE IMPAIRMENTS decreased activity tolerance, decreased knowledge of condition, decreased ROM, decreased strength, hypomobility, increased fascial restrictions,  increased muscle spasms, impaired flexibility, postural dysfunction, obesity, and pain.   ACTIVITY LIMITATIONS cleaning, community activity, driving, occupation, laundry, and shopping.   PERSONAL FACTORS Fitness, Time since onset of injury/illness/exacerbation, and 1 comorbidity: recent surgery  are also affecting patient's functional outcome.    REHAB POTENTIAL: Good  CLINICAL DECISION MAKING: Evolving/moderate complexity  EVALUATION COMPLEXITY: Moderate   GOALS: Goals reviewed with patient? No  SHORT TERM GOALS:  STG Name Target Date Goal status  1 Patient will demonstrate knowledge and application of appropriate sitting posture to reduce stress on her back.  Baseline:  06/24/2021 INITIAL   LONG TERM GOALS:   LTG Name Target Date Goal status  1 Patient will demonstrate pain free lumbar flexion AROM to improve ability to complete bending and reaching activity.  Baseline: 07/15/2021 INITIAL  2 Patient will demonstrate at least 4/5 Lt hip strength to improve lumbopelvic stability.  Baseline: 07/15/2021 INITIAL  3 Patient will tolerate sitting in neutral position for at least 30 minutes  Baseline: shifts positions throughout evaluation, increased pain with sitting  07/15/2021 INITIAL  4 Patient will score at least 53% function on FOTO to signify clinically meaningful improvement in functional abilities.  Baseline: 07/15/2021 INITIAL   PLAN: PT FREQUENCY: 2x/week  PT DURATION: 6 weeks  PLANNED INTERVENTIONS: Therapeutic exercises, Therapeutic activity, Neuromuscular re-education, Balance training, Gait training, Patient/Family education, Joint mobilization, Stair training, Aquatic Therapy, Dry Needling, Electrical stimulation, Spinal manipulation, Spinal mobilization, Cryotherapy, Moist heat, Taping, and Manual therapy  PLAN FOR NEXT SESSION: issue initial HEP for spinal mobility and hip stretching, posture education, core stabilization, review Harolyn Rutherford, PT, DPT,  ATC 06/03/21 2:23 PM

## 2021-06-03 ENCOUNTER — Other Ambulatory Visit: Payer: Self-pay | Admitting: Family Medicine

## 2021-06-03 ENCOUNTER — Other Ambulatory Visit: Payer: Self-pay

## 2021-06-03 ENCOUNTER — Ambulatory Visit: Payer: Self-pay | Attending: Obstetrics and Gynecology

## 2021-06-03 DIAGNOSIS — M25552 Pain in left hip: Secondary | ICD-10-CM

## 2021-06-03 DIAGNOSIS — G8929 Other chronic pain: Secondary | ICD-10-CM

## 2021-06-03 DIAGNOSIS — M6281 Muscle weakness (generalized): Secondary | ICD-10-CM

## 2021-06-03 DIAGNOSIS — R293 Abnormal posture: Secondary | ICD-10-CM

## 2021-06-03 DIAGNOSIS — M545 Low back pain, unspecified: Secondary | ICD-10-CM | POA: Insufficient documentation

## 2021-06-03 NOTE — Telephone Encounter (Signed)
Requested medication (s) are due for refill today: requested  ? ?Requested medication (s) are on the active medication list: yes ? ?Last refill:  04/19/21 #10 0 refill  ? ?Future visit scheduled: yes in 3 weeks  ? ?Notes to clinic:  not delegated per protocol, last ordered by Myrene Galas. [place], MD.  Do you want to order Rx? ? ? ?  ?Requested Prescriptions  ?Pending Prescriptions Disp Refills  ? HYDROcodone-acetaminophen (NORCO/VICODIN) 5-325 MG tablet 10 tablet 0  ?  Sig: Take 1 tablet by mouth every 6 (six) hours as needed.  ?  ? Not Delegated - Analgesics:  Opioid Agonist Combinations Failed - 06/03/2021  2:59 PM  ?  ?  Failed - This refill cannot be delegated  ?  ?  Failed - Urine Drug Screen completed in last 360 days  ?  ?  Passed - Valid encounter within last 3 months  ?  Recent Outpatient Visits   ? ?      ? 2 months ago Influenza A  ? [place], Charlane Ferretti, MD  ? 3 months ago Acute pulmonary embolism without acute cor pulmonale, unspecified pulmonary embolism type ([place])  ? [place], Charlane Ferretti, MD  ? 5 months ago Plantar fasciitis  ? [place], Charlane Ferretti, MD  ? 6 months ago Weakness of both legs  ? [place], MD  ? 3 years ago Prediabetes  ? [place] Gildardo Pounds, NP  ? ?  ?  ?Future Appointments   ? ?        ? In 3 weeks Charlott Rakes, MD [place]  ? In 2 months Hollice Espy, MD [place]  ? ?  ? ?  ?  ?  ? ?

## 2021-06-03 NOTE — Telephone Encounter (Signed)
Medication Refill - Medication: HYDROcodone-acetaminophen (NORCO/VICODIN) 5-325 MG tablet ? ?Pt stated was advised at PT to ask PCP for medication. ? ?Has the patient contacted their pharmacy? Yes.   Contact PCP no more refills. . ? ?(Agent: If yes, when and what did the pharmacy advise?) ? ?Preferred Pharmacy (with phone number or street name):  ? ?[facility] [employer], [place] [address]  ?Phone: [phone] Fax: [phone]  ?Hours: M-F 7:30a-6:00p  ? ?Has the patient been seen for an appointment in the last year OR does the patient have an upcoming appointment? Yes.   ? ?Agent: Please be advised that RX refills may take up to 3 business days. We ask that you follow-up with your pharmacy.  ?

## 2021-06-07 NOTE — Therapy (Signed)
?OUTPATIENT PHYSICAL THERAPY TREATMENT NOTE ? ? ?Patient Name: Mariah Lang ?MRN: [id number] ?DOB:1971-03-27, 51 y.o., female ?Today's Date: 06/09/2021 ? ?PCP: System, Provider Not In ?REFERRING PROVIDER: Gillis Ends* ? ? PT End of Session - 06/09/21 1446   ? ? Visit Number 2   ? Number of Visits 13   ? Date for PT Re-Evaluation 07/17/21   ? Authorization Type CAFA   ? PT Start Time 4235   ? PT Stop Time 1530   ? PT Time Calculation (min) 45 min   ? Activity Tolerance Patient limited by pain   ? Behavior During Therapy [facility] for tasks assessed/performed   ? ?  ?  ? ?  ? ? ?Past Medical History:  ?Diagnosis Date  ? Anemia   ? ?Past Surgical History:  ?Procedure Laterality Date  ? APPENDECTOMY    ? CESAREAN SECTION  2000  ? KIDNEY SURGERY    ? LAPAROSCOPIC NEPHRECTOMY, HAND ASSISTED Left 02/17/2021  ? Procedure: HAND ASSISTED LAPAROSCOPIC RADICAL  NEPHRECTOMY;  Surgeon: Hollice Espy, MD;  Location: [facility];  Service: Urology;  Laterality: Left;  ? LAPAROTOMY  02/17/2021  ? Procedure: LAPAROTOMY OR HAND ASSIST;  Surgeon: Gillis Ends, MD;  Location: [facility] OR[facility];  Service: Gynecology;;  Fibroid excision   ? OTHER SURGICAL HISTORY  1993  ? c-section  ? PARTIAL HYSTERECTOMY    ? ROBOTIC ASSISTED LAPAROSCOPIC HYSTERECTOMY AND SALPINGECTOMY Bilateral 02/17/2021  ? Procedure: XI ROBOTIC ASSISTED LAPAROSCOPIC HYSTERECTOMY AND SALPINGECTOMY;  Surgeon: Gillis Ends, MD;  Location: [facility] OR[facility];  Service: Gynecology;  Laterality: Bilateral;  ? ?Patient Active Problem List  ? Diagnosis Date Noted  ? Hyponatremia 02/20/2021  ? Aspiration pneumonia of both lower lobes due to gastric secretions ([place]) 02/19/2021  ? Pulmonary embolism ([place]) 02/19/2021  ? Acute hypoxemic respiratory failure ([place]) 02/19/2021  ? Renal cell carcinoma ([place]) 02/17/2021  ? Ankle edema, bilateral 09/14/2020  ? Vitamin D deficiency 09/14/2020  ? Thrombocytosis 05/11/2019  ? Language barrier 12/31/2018  ? Screening  breast examination 12/04/2018  ? Fibroid uterus 12/18/2014  ? Menorrhagia, premenopausal 11/24/2014  ? Iron deficiency anemia 11/24/2014  ? Iron deficiency anemia due to chronic blood loss 09/02/2014  ? Pap smear for cervical cancer screening 09/02/2014  ? Annual physical exam 09/02/2014  ? Dysmenorrhea 02/15/2013  ? Anemia due to Dysmenorrhea 02/15/2013  ? ? ?REFERRING PROVIDER: Gillis Ends* ?  ?REFERRING DIAG: Left-sided low back pain without sciatica, unspecified chronicity  ? ?THERAPY DIAG:  ?Chronic left-sided low back pain without sciatica ? ?Pain in left hip ? ?Muscle weakness (generalized) ? ?Abnormal posture ? ?PERTINENT HISTORY: Left renal mass and and uterine fibroids s/p LAPAROSCOPIC HYSTERECTOMY AND SALPINGECTOMY HAND ASSISTED LAPAROSCOPIC RADICAL NEPHRECTOMY on 02/17/2021 ? ?PRECAUTIONS: None ? ?SUBJECTIVE: Patient reports she is the same since last visit. She continues to have the left lower back and hip pain. ? ?PAIN:  ?Are you having pain? Yes ?NPRS scale: 8/10 ?Pain location: left low back, posterior hip   ?PAIN TYPE: cramping, stabbing  ?Pain description: intermittent  ?Aggravating factors: prolonged sitting, laying on the left side   ?Relieving factors: medication, laying on the right side  ? ?PATIENT GOALS "for the pain to go away." ? ? ?OBJECTIVE:  ?PATIENT SURVEYS:  ?FOTO 28% to 53% ?  ?POSTURE:  ?Slump sitting posture; shifts positions in sitting throughout evaluation due to pain.  ?  ?PALPATION: ?Hypersensitive to palpation of Lt gluteals, piriformis, lumbar paraspinals ?Hypomobility L-spine  ?  ?LUMBARAROM/PROM ?  ?  A/PROM A/PROM  ?06/03/2021  ?Flexion 25% limited increased LBP  ?Extension WNL  ?Right lateral flexion 50% limited LBP  ?Left lateral flexion WNL  ?Right rotation 50% limited   ?Left rotation 50% limited   ? ?LE AROM/PROM: hip PROM WNL bilaterally, though increased pain with flexion, IR, and ER bilaterally  ?  ?LE MMT: ?  ?MMT Right ?06/03/2021 Left ?06/03/2021  ?Hip  flexion 5/5 4-/5 LBP  ?Hip extension Unable to complete prone hip extension due to increased LBP Unable to complete prone hip extension due to increased LBP  ?Hip abduction 4-/5 4-/5   ?Hip adduction      ?Hip internal rotation      ?Hip external rotation      ?Knee flexion      ?Knee extension      ?Ankle dorsiflexion      ?Ankle plantarflexion      ?Ankle inversion      ?Ankle eversion      ? (Blank rows = not tested) ?  ?LUMBAR SPECIAL TESTS:  ?(-) SLR (+) FABER (+) Gaenslen's (-) Sacral Thrust (-) SI Compression/Distraction  ?  ?  ?TODAY'S TREATMENT  ?06/12/2021: ?Therapeutic Exercise: ?NuStep L3 x 4 min with UE/LE (partial ranges) while taking subjective ?LTR x 10 ?Hooklyig SKTC using towel 3 x 20 sec ?Hookling abdominal engagement 5 x 5 sec hold - cued for breathing ?Clamshell 2 x 10 ?Manual: ?LAD for LLE x 5 bouts ?STM using FR and percussive device to left lumbar paraspinals and left gluteal region while patient right sidelying ? ? ?06/03/2021 ?Therapeutic Activity: ?Education on assessment findings that will be addressed throughout duration of POC.  ?  ?PATIENT EDUCATION:  ?Education details: HEP udpate ?Person educated: Patient, interpreter  ?Education method: Explanation, Handout ?Education comprehension: verbalized understanding ?  ?HOME EXERCISE PROGRAM: ?No time at eval to issue  ?  ? ?ASSESSMENT: ?CLINICAL IMPRESSION: ?Patient with fair tolerance for therapy, with no adverse effects. She continues to demonstrate high pain levels and high irritability so therapy limited in progression of lumbar/hip mobility and strengthening. Patient was able to tolerate gentle stretches and gluteal/abdominal activation exercises but with partial ranges and extended rest breaks. Provided patient with an HEP this visit and instructed on using modalities at home for pain control. Patient would benefit from continued skilled PT to progress mobility and strength in order to reduce pain and maximize functional ability. ?  ?   ?OBJECTIVE IMPAIRMENTS decreased activity tolerance, decreased knowledge of condition, decreased ROM, decreased strength, hypomobility, increased fascial restrictions, increased muscle spasms, impaired flexibility, postural dysfunction, obesity, and pain.  ?  ?ACTIVITY LIMITATIONS cleaning, community activity, driving, occupation, laundry, and shopping.  ?  ?PERSONAL FACTORS Fitness, Time since onset of injury/illness/exacerbation, and 1 comorbidity: recent surgery  are also affecting patient's functional outcome.  ?  ?  ?GOALS: ?Goals reviewed with patient? No ?  ?SHORT TERM GOALS: ?  ?STG Name Target Date Goal status  ?1 Patient will demonstrate knowledge and application of appropriate sitting posture to reduce stress on her back.  ?Baseline:  06/24/2021 INITIAL  ?  ?LONG TERM GOALS:  ?  ?LTG Name Target Date Goal status  ?1 Patient will demonstrate pain free lumbar flexion AROM to improve ability to complete bending and reaching activity.  ?Baseline: 07/15/2021 INITIAL  ?2 Patient will demonstrate at least 4/5 Lt hip strength to improve lumbopelvic stability.  ?Baseline: 07/15/2021 INITIAL  ?3 Patient will tolerate sitting in neutral position for at least 30 minutes  ?Baseline:  shifts positions throughout evaluation, increased pain with sitting  07/15/2021 INITIAL  ?4 Patient will score at least 53% function on FOTO to signify clinically meaningful improvement in functional abilities.  ?Baseline: 07/15/2021 INITIAL  ? ?  ?PLAN: ?PT FREQUENCY: 2x/week ?  ?PT DURATION: 6 weeks ?  ?PLANNED INTERVENTIONS: Therapeutic exercises, Therapeutic activity, Neuromuscular re-education, Balance training, Gait training, Patient/Family education, Joint mobilization, Stair training, Aquatic Therapy, Dry Needling, Electrical stimulation, Spinal manipulation, Spinal mobilization, Cryotherapy, Moist heat, Taping, and Manual therapy ?  ?PLAN FOR NEXT SESSION: issue initial HEP for spinal mobility and hip stretching, posture education,  core stabilization, review FOTO  ? ? ?Hilda Blades, PT, DPT, LAT, ATC ?06/09/21  3:32 PM ?Phone: [phone] ?Fax: [phone] ? ? ?  ? ? ?

## 2021-06-09 ENCOUNTER — Other Ambulatory Visit: Payer: Self-pay

## 2021-06-09 ENCOUNTER — Ambulatory Visit: Payer: Self-pay | Admitting: Physical Therapy

## 2021-06-09 ENCOUNTER — Encounter: Payer: Self-pay | Admitting: Physical Therapy

## 2021-06-09 DIAGNOSIS — R293 Abnormal posture: Secondary | ICD-10-CM

## 2021-06-09 DIAGNOSIS — G8929 Other chronic pain: Secondary | ICD-10-CM

## 2021-06-09 DIAGNOSIS — M6281 Muscle weakness (generalized): Secondary | ICD-10-CM

## 2021-06-09 DIAGNOSIS — M25552 Pain in left hip: Secondary | ICD-10-CM

## 2021-06-09 NOTE — Patient Instructions (Signed)
Access Code: [email or web address] ?URL: [email or web address] ?Date: 06/09/2021 ?Prepared by: Hilda Blades ? ?Exercises ?Supine Lower Trunk Rotation - 1-2 x daily - 10 reps - 5 seconds hold ?Hooklying Single Knee to Chest Stretch with Towel - 1-2 x daily - 3 reps - 20 seconds hold ?Supine Transversus Abdominis Bracing - Hands on Stomach - 1-2 x daily - 10 reps - 5 seconds hold ?Clamshell - 1-2 x daily - 2 sets - 10 reps ? ?

## 2021-06-10 NOTE — Therapy (Signed)
OUTPATIENT PHYSICAL THERAPY TREATMENT NOTE   Patient Name: Mariah Lang MRN: [id number] DOB:1971/01/21, 51 y.o., female Today's Date: 06/12/2021  PCP: System, Provider Not In REFERRING PROVIDER: [place], Maynard - 06/12/21 1112     Visit Number 3    Number of Visits 13    Date for PT Re-Evaluation 07/17/21    Authorization Type CAFA    PT Start Time 1115    PT Stop Time 1155    PT Time Calculation (min) 40 min    Activity Tolerance Patient limited by pain    Behavior During Therapy [facility] for tasks assessed/performed              Past Medical History:  Diagnosis Date   Anemia    Past Surgical History:  Procedure Laterality Date   APPENDECTOMY     CESAREAN SECTION  2000   KIDNEY SURGERY     LAPAROSCOPIC NEPHRECTOMY, HAND ASSISTED Left 02/17/2021   Procedure: HAND ASSISTED LAPAROSCOPIC RADICAL  NEPHRECTOMY;  Surgeon: Hollice Espy, MD;  Location: [facility];  Service: Urology;  Laterality: Left;   LAPAROTOMY  02/17/2021   Procedure: LAPAROTOMY OR HAND ASSIST;  Surgeon: Gillis Ends, MD;  Location: [facility];  Service: Gynecology;;  Fibroid excision    OTHER SURGICAL HISTORY  1993   c-section   PARTIAL HYSTERECTOMY     ROBOTIC ASSISTED LAPAROSCOPIC HYSTERECTOMY AND SALPINGECTOMY Bilateral 02/17/2021   Procedure: XI ROBOTIC ASSISTED LAPAROSCOPIC HYSTERECTOMY AND SALPINGECTOMY;  Surgeon: Gillis Ends, MD;  Location: [facility];  Service: Gynecology;  Laterality: Bilateral;   Patient Active Problem List   Diagnosis Date Noted   Hyponatremia 02/20/2021   Aspiration pneumonia of both lower lobes due to gastric secretions ([place]) 02/19/2021   Pulmonary embolism ([place]) 02/19/2021   Acute hypoxemic respiratory failure ([place]) 02/19/2021   Renal cell carcinoma ([place]) 02/17/2021   Ankle edema, bilateral 09/14/2020   Vitamin D deficiency 09/14/2020   Thrombocytosis 05/11/2019   Language barrier 12/31/2018    Screening breast examination 12/04/2018   Fibroid uterus 12/18/2014   Menorrhagia, premenopausal 11/24/2014   Iron deficiency anemia 11/24/2014   Iron deficiency anemia due to chronic blood loss 09/02/2014   Pap smear for cervical cancer screening 09/02/2014   Annual physical exam 09/02/2014   Dysmenorrhea 02/15/2013   Anemia due to Dysmenorrhea 02/15/2013    REFERRING PROVIDER: Gillis Ends*   REFERRING DIAG: Left-sided low back pain without sciatica, unspecified chronicity   THERAPY DIAG:  Chronic left-sided low back pain without sciatica  Pain in left hip  Muscle weakness (generalized)  Abnormal posture  PERTINENT HISTORY: Left renal mass and and uterine fibroids s/p LAPAROSCOPIC HYSTERECTOMY AND SALPINGECTOMY HAND ASSISTED LAPAROSCOPIC RADICAL NEPHRECTOMY on 02/17/2021  PRECAUTIONS: None  SUBJECTIVE: Patient reports yesterday afternoon she had a great deal of pain as well as today, and the pain has not gone away. Patient states the pain comes and goes. She does report she felt a little better after therapy last visit. She has been taking tylenol for pain because her pain medication prescription has not been refilled yet.  PAIN:  Are you having pain? Yes NPRS scale: 9/10 Pain location: left low back, posterior hip   PAIN TYPE: cramping, stabbing  Pain description: intermittent  Aggravating factors: prolonged sitting, laying on the left side   Relieving factors: medication, laying on the right side   PATIENT GOALS: "for the pain to go away"   OBJECTIVE:  PATIENT SURVEYS:  FOTO  28% to 53%   POSTURE:  Slump sitting posture; shifts positions in sitting throughout evaluation due to pain.    PALPATION: Hypersensitive to palpation of Lt gluteals, piriformis, lumbar paraspinals Hypomobility L-spine    LUMBARAROM/PROM   A/PROM A/PROM  06/03/2021  Flexion 25% limited increased LBP  Extension WNL  Right lateral flexion 50% limited LBP  Left lateral flexion  WNL  Right rotation 50% limited   Left rotation 50% limited    LE AROM/PROM: hip PROM WNL bilaterally, though increased pain with flexion, IR, and ER bilaterally    LE MMT:   MMT Right 06/03/2021 Left 06/03/2021  Hip flexion 5/5 4-/5 LBP  Hip extension Unable to complete prone hip extension due to increased LBP Unable to complete prone hip extension due to increased LBP  Hip abduction 4-/5 4-/5   Hip adduction      Hip internal rotation      Hip external rotation      Knee flexion      Knee extension      Ankle dorsiflexion      Ankle plantarflexion      Ankle inversion      Ankle eversion       (Blank rows = not tested)   LUMBAR SPECIAL TESTS:  (-) SLR (+) FABER (+) Gaenslen's (-) Sacral Thrust (-) SI Compression/Distraction      TODAY'S TREATMENT  06/12/2021: Therapeutic Exercise: NuStep L3 x 5 min with UE/LE (partial ranges) while taking subjective Trialed hooklying clamshell with yellow but patient began cramping in right hamstring/calf region so discontinued supine exercises Seated clamshell with yellow 2 x 5 Seated abdominal set with physioball squeeze 2 x 5 with 5 sec hold forward and lateral Row with yellow 2 x 10 Manual: LAD for LLE x 5 bouts Trialed PROM for left hip and hamstring stretch but patient unable to tolerate   06/09/2021: Therapeutic Exercise: NuStep L3 x 4 min with UE/LE (partial ranges) while taking subjective LTR x 10 Hooklyig SKTC using towel 3 x 20 sec Hookling abdominal engagement 5 x 5 sec hold - cued for breathing Clamshell 2 x 10 Manual: LAD for LLE x 5 bouts STM using FR and percussive device to left lumbar paraspinals and left gluteal region while patient right sidelying  06/03/2021 Therapeutic Activity: Education on assessment findings that will be addressed throughout duration of POC.    PATIENT EDUCATION:  Education details: HEP Person educated: Patient, interpreter  Education method: Explanation, Handout Education comprehension:  verbalized understanding   HOME EXERCISE PROGRAM: Access Code: [email or web address]    ASSESSMENT: CLINICAL IMPRESSION: Patient with poor tolerance for therapy, with no adverse effects. She arrives reporting increased pain this visit and was not able to tolerate supine exercises or passive motion due to left hip/low back pain and cramping in right posterior leg. Performed abdominal and postural exercises in seated with better tolerance but patient still with high levels of pain post therapy. It is unclear etiology of pain as there does not seem to be clear agg/ease factors, patient will have severe pain with minimal movement or activity that limits progress in therapy. Patient would benefit from continued skilled PT to progress mobility and strength in order to reduce pain and maximize functional ability.     OBJECTIVE IMPAIRMENTS decreased activity tolerance, decreased knowledge of condition, decreased ROM, decreased strength, hypomobility, increased fascial restrictions, increased muscle spasms, impaired flexibility, postural dysfunction, obesity, and pain.    ACTIVITY LIMITATIONS cleaning, community activity, driving, occupation, laundry, and shopping.  PERSONAL FACTORS Fitness, Time since onset of injury/illness/exacerbation, and 1 comorbidity: recent surgery  are also affecting patient's functional outcome.      GOALS: Goals reviewed with patient? No   SHORT TERM GOALS:   STG Name Target Date Goal status  1 Patient will demonstrate knowledge and application of appropriate sitting posture to reduce stress on her back.  Baseline:  06/24/2021 INITIAL    LONG TERM GOALS:    LTG Name Target Date Goal status  1 Patient will demonstrate pain free lumbar flexion AROM to improve ability to complete bending and reaching activity.  Baseline: 07/15/2021 INITIAL  2 Patient will demonstrate at least 4/5 Lt hip strength to improve lumbopelvic stability.  Baseline: 07/15/2021 INITIAL  3 Patient will  tolerate sitting in neutral position for at least 30 minutes  Baseline: shifts positions throughout evaluation, increased pain with sitting  07/15/2021 INITIAL  4 Patient will score at least 53% function on FOTO to signify clinically meaningful improvement in functional abilities.  Baseline: 07/15/2021 INITIAL     PLAN: PT FREQUENCY: 2x/week   PT DURATION: 6 weeks   PLANNED INTERVENTIONS: Therapeutic exercises, Therapeutic activity, Neuromuscular re-education, Balance training, Gait training, Patient/Family education, Joint mobilization, Stair training, Aquatic Therapy, Dry Needling, Electrical stimulation, Spinal manipulation, Spinal mobilization, Cryotherapy, Moist heat, Taping, and Manual therapy   PLAN FOR NEXT SESSION: issue initial HEP for spinal mobility and hip stretching, posture education, core stabilization, review FOTO    Hilda Blades, PT, DPT, LAT, ATC 06/12/21  12:02 PM Phone: [phone] Fax: [phone]

## 2021-06-12 ENCOUNTER — Encounter: Payer: Self-pay | Admitting: Physical Therapy

## 2021-06-12 ENCOUNTER — Ambulatory Visit: Payer: Self-pay | Admitting: Physical Therapy

## 2021-06-12 ENCOUNTER — Other Ambulatory Visit: Payer: Self-pay

## 2021-06-12 DIAGNOSIS — R293 Abnormal posture: Secondary | ICD-10-CM

## 2021-06-12 DIAGNOSIS — G8929 Other chronic pain: Secondary | ICD-10-CM

## 2021-06-12 DIAGNOSIS — M6281 Muscle weakness (generalized): Secondary | ICD-10-CM

## 2021-06-12 DIAGNOSIS — M25552 Pain in left hip: Secondary | ICD-10-CM

## 2021-06-16 ENCOUNTER — Other Ambulatory Visit: Payer: Self-pay

## 2021-06-16 ENCOUNTER — Ambulatory Visit: Payer: Self-pay

## 2021-06-16 DIAGNOSIS — M6281 Muscle weakness (generalized): Secondary | ICD-10-CM

## 2021-06-16 DIAGNOSIS — M25552 Pain in left hip: Secondary | ICD-10-CM

## 2021-06-16 DIAGNOSIS — G8929 Other chronic pain: Secondary | ICD-10-CM

## 2021-06-16 DIAGNOSIS — R293 Abnormal posture: Secondary | ICD-10-CM

## 2021-06-16 NOTE — Therapy (Signed)
?OUTPATIENT PHYSICAL THERAPY TREATMENT NOTE ? ? ?Patient Name: Mariah Lang ?MRN: [id number] ?DOB:12-30-1970, 51 y.o., female ?Today's Date: 06/16/2021 ? ?PCP: System, Provider Not In ?REFERRING PROVIDER: Gillis Ends* ? ? PT End of Session - 06/16/21 1526   ? ? Visit Number 4   ? Number of Visits 13   ? Date for PT Re-Evaluation 07/17/21   ? Authorization Type CAFA   ? PT Start Time 1530   ? PT Stop Time 1612   ? PT Time Calculation (min) 42 min   ? Activity Tolerance Patient limited by pain   ? Behavior During Therapy [facility] for tasks assessed/performed   ? ?  ?  ? ?  ? ? ? ?Past Medical History:  ?Diagnosis Date  ? Anemia   ? ?Past Surgical History:  ?Procedure Laterality Date  ? APPENDECTOMY    ? CESAREAN SECTION  2000  ? KIDNEY SURGERY    ? LAPAROSCOPIC NEPHRECTOMY, HAND ASSISTED Left 02/17/2021  ? Procedure: HAND ASSISTED LAPAROSCOPIC RADICAL  NEPHRECTOMY;  Surgeon: Hollice Espy, MD;  Location: [facility];  Service: Urology;  Laterality: Left;  ? LAPAROTOMY  02/17/2021  ? Procedure: LAPAROTOMY OR HAND ASSIST;  Surgeon: Gillis Ends, MD;  Location: [facility];  Service: Gynecology;;  Fibroid excision   ? OTHER SURGICAL HISTORY  1993  ? c-section  ? PARTIAL HYSTERECTOMY    ? ROBOTIC ASSISTED LAPAROSCOPIC HYSTERECTOMY AND SALPINGECTOMY Bilateral 02/17/2021  ? Procedure: XI ROBOTIC ASSISTED LAPAROSCOPIC HYSTERECTOMY AND SALPINGECTOMY;  Surgeon: Gillis Ends, MD;  Location: [facility];  Service: Gynecology;  Laterality: Bilateral;  ? ?Patient Active Problem List  ? Diagnosis Date Noted  ? Hyponatremia 02/20/2021  ? Aspiration pneumonia of both lower lobes due to gastric secretions ([place]) 02/19/2021  ? Pulmonary embolism ([place]) 02/19/2021  ? Acute hypoxemic respiratory failure ([place]) 02/19/2021  ? Renal cell carcinoma ([place]) 02/17/2021  ? Ankle edema, bilateral 09/14/2020  ? Vitamin D deficiency 09/14/2020  ? Thrombocytosis 05/11/2019  ? Language barrier 12/31/2018  ?  Screening breast examination 12/04/2018  ? Fibroid uterus 12/18/2014  ? Menorrhagia, premenopausal 11/24/2014  ? Iron deficiency anemia 11/24/2014  ? Iron deficiency anemia due to chronic blood loss 09/02/2014  ? Pap smear for cervical cancer screening 09/02/2014  ? Annual physical exam 09/02/2014  ? Dysmenorrhea 02/15/2013  ? Anemia due to Dysmenorrhea 02/15/2013  ? ? ?REFERRING PROVIDER: Gillis Ends* ?  ?REFERRING DIAG: Left-sided low back pain without sciatica, unspecified chronicity  ? ?THERAPY DIAG:  ?Chronic left-sided low back pain without sciatica ? ?Pain in left hip ? ?Muscle weakness (generalized) ? ?Abnormal posture ? ?PERTINENT HISTORY: Left renal mass and and uterine fibroids s/p LAPAROSCOPIC HYSTERECTOMY AND SALPINGECTOMY HAND ASSISTED LAPAROSCOPIC RADICAL NEPHRECTOMY on 02/17/2021 ? ?PRECAUTIONS: None ? ?Interpreter utilized  ?SUBJECTIVE:  ? ?Patient reports she is feeling "more or less." She reports her pain is less now, but was worse after last session.  ?PAIN:  ?Are you having pain? Yes ?NPRS scale: 6/10 ?Pain location: low back  ?PAIN TYPE: sharp ?Pain description: intermittent  ?Aggravating factors: prolonged sitting, laying on the left side   ?Relieving factors: medication, laying on the right side  ? ?PATIENT GOALS: "for the pain to go away" ? ? ?OBJECTIVE:  ?*Unless otherwise noted all objective measures were captured on initial evaluation.  ? ? ?PATIENT SURVEYS:  ?FOTO 28% to 53% ?  ?POSTURE:  ?Slump sitting posture; shifts positions in sitting throughout evaluation due to pain.  ?  ?PALPATION: ?Hypersensitive  to palpation of Lt gluteals, piriformis, lumbar paraspinals ?Hypomobility L-spine  ?  ?LUMBARAROM/PROM ?  ?A/PROM A/PROM  ?06/03/2021 06/16/21  ?Flexion 25% limited increased LBP WNL; increased LBP  ?Extension WNL   ?Right lateral flexion 50% limited LBP WNL; no increase in pain  ?Left lateral flexion WNL WNL; no increase in pain  ?Right rotation 50% limited    ?Left rotation  50% limited    ? ?LE AROM/PROM: hip PROM WNL bilaterally, though increased pain with flexion, IR, and ER bilaterally  ?  ?LE MMT: ?  ?MMT Right ?06/03/2021 Left ?06/03/2021  ?Hip flexion 5/5 4-/5 LBP  ?Hip extension Unable to complete prone hip extension due to increased LBP Unable to complete prone hip extension due to increased LBP  ?Hip abduction 4-/5 4-/5   ?Hip adduction      ?Hip internal rotation      ?Hip external rotation      ?Knee flexion      ?Knee extension      ?Ankle dorsiflexion      ?Ankle plantarflexion      ?Ankle inversion      ?Ankle eversion      ? (Blank rows = not tested) ?  ?LUMBAR SPECIAL TESTS:  ?(-) SLR (+) FABER (+) Gaenslen's (-) Sacral Thrust (-) SI Compression/Distraction  ?  ?  ? ?TODAY'S TREATMENT  ?[facility]:                                                DATE: 06/16/21 ?Therapeutic Exercise: ?LTR x 2 minutes  ?SKC x 30 sec each ?Figure 4 stretch x 30 sec each  ?Supine pelvic tilts 2 x 10  ?Supine glute sets 2 x 10; 5 sec hold  ?Hip adduction isometric in hooklying 2 x 10; 5 sec hold ?Clamshells LLE only (unable to lay on left side due to pain) 2 x 10; partial range  ? ? ?06/12/2021: ?Therapeutic Exercise: ?NuStep L3 x 5 min with UE/LE (partial ranges) while taking subjective ?Trialed hooklying clamshell with yellow but patient began cramping in right hamstring/calf region so discontinued supine exercises ?Seated clamshell with yellow 2 x 5 ?Seated abdominal set with physioball squeeze 2 x 5 with 5 sec hold forward and lateral ?Row with yellow 2 x 10 ?Manual: ?LAD for LLE x 5 bouts ?Trialed PROM for left hip and hamstring stretch but patient unable to tolerate ? ? ?06/09/2021: ?Therapeutic Exercise: ?NuStep L3 x 4 min with UE/LE (partial ranges) while taking subjective ?LTR x 10 ?Hooklyig SKTC using towel 3 x 20 sec ?Hookling abdominal engagement 5 x 5 sec hold - cued for breathing ?Clamshell 2 x 10 ?Manual: ?LAD for LLE x 5 bouts ?STM using FR and percussive device to left  lumbar paraspinals and left gluteal region while patient right sidelying ? ? ?  ?PATIENT EDUCATION:  ?Education details: N/A ?Person educated: N/A ?Education method: N/A ?Education comprehension: N/A ?HOME EXERCISE PROGRAM: ?Access Code: [email or web address] ?  ? ?ASSESSMENT: ?CLINICAL IMPRESSION: ?Patient with poor tolerance to therapy secondary to pain, with no adverse effects. She arrives with less pain compared to previous session and does demonstrate improved lumbar flexion and lateral flexion AROM compared to initial evaluation. Despite these improvements, she continues to report increased back/hip pain with all gentle lumbar mobility and hip/core strengthening which is limiting overall ability to progress in PT  at this time.   ?  ?OBJECTIVE IMPAIRMENTS decreased activity tolerance, decreased knowledge of condition, decreased ROM, decreased strength, hypomobility, increased fascial restrictions, increased muscle spasms, impaired flexibility, postural dysfunction, obesity, and pain.  ?  ?ACTIVITY LIMITATIONS cleaning, community activity, driving, occupation, laundry, and shopping.  ?  ?PERSONAL FACTORS Fitness, Time since onset of injury/illness/exacerbation, and 1 comorbidity: recent surgery  are also affecting patient's functional outcome.  ?  ?  ?GOALS: ?Goals reviewed with patient? No ?  ?SHORT TERM GOALS: ?  ?STG Name Target Date Goal status  ?1 Patient will demonstrate knowledge and application of appropriate sitting posture to reduce stress on her back.  ?Baseline:  06/24/2021 INITIAL  ?  ?LONG TERM GOALS:  ?  ?LTG Name Target Date Goal status  ?1 Patient will demonstrate pain free lumbar flexion AROM to improve ability to complete bending and reaching activity.  ?Baseline: 07/15/2021 INITIAL  ?2 Patient will demonstrate at least 4/5 Lt hip strength to improve lumbopelvic stability.  ?Baseline: 07/15/2021 INITIAL  ?3 Patient will tolerate sitting in neutral position for at least 30 minutes  ?Baseline: shifts positions  throughout evaluation, increased pain with sitting  07/15/2021 INITIAL  ?4 Patient will score at least 53% function on FOTO to signify clinically meaningful improvement in functional abilities.  ?Baseline: 4/13

## 2021-06-17 NOTE — Therapy (Signed)
?OUTPATIENT PHYSICAL THERAPY TREATMENT NOTE ? ? ?Patient Name: Mariah Lang ?MRN: [id number] ?DOB:27-Sep-1970, 51 y.o., female ?Today's Date: 06/18/2021 ? ?PCP: System, Provider Not In ?REFERRING PROVIDER: Gillis Ends* ? ? PT End of Session - 06/18/21 0707   ? ? Visit Number 5   ? Number of Visits 13   ? Date for PT Re-Evaluation 07/17/21   ? Authorization Type CAFA   ? PT Start Time 0700   ? PT Stop Time [phone]   ? PT Time Calculation (min) 38 min   ? Activity Tolerance Patient limited by pain   ? Behavior During Therapy [facility] for tasks assessed/performed   ? ?  ?  ? ?  ? ? ? ? ?Past Medical History:  ?Diagnosis Date  ? Anemia   ? ?Past Surgical History:  ?Procedure Laterality Date  ? APPENDECTOMY    ? CESAREAN SECTION  2000  ? KIDNEY SURGERY    ? LAPAROSCOPIC NEPHRECTOMY, HAND ASSISTED Left 02/17/2021  ? Procedure: HAND ASSISTED LAPAROSCOPIC RADICAL  NEPHRECTOMY;  Surgeon: Hollice Espy, MD;  Location: [facility];  Service: Urology;  Laterality: Left;  ? LAPAROTOMY  02/17/2021  ? Procedure: LAPAROTOMY OR HAND ASSIST;  Surgeon: Gillis Ends, MD;  Location: [facility];  Service: Gynecology;;  Fibroid excision   ? OTHER SURGICAL HISTORY  1993  ? c-section  ? PARTIAL HYSTERECTOMY    ? ROBOTIC ASSISTED LAPAROSCOPIC HYSTERECTOMY AND SALPINGECTOMY Bilateral 02/17/2021  ? Procedure: XI ROBOTIC ASSISTED LAPAROSCOPIC HYSTERECTOMY AND SALPINGECTOMY;  Surgeon: Gillis Ends, MD;  Location: [facility];  Service: Gynecology;  Laterality: Bilateral;  ? ?Patient Active Problem List  ? Diagnosis Date Noted  ? Hyponatremia 02/20/2021  ? Aspiration pneumonia of both lower lobes due to gastric secretions ([place]) 02/19/2021  ? Pulmonary embolism ([place]) 02/19/2021  ? Acute hypoxemic respiratory failure ([place]) 02/19/2021  ? Renal cell carcinoma ([place]) 02/17/2021  ? Ankle edema, bilateral 09/14/2020  ? Vitamin D deficiency 09/14/2020  ? Thrombocytosis 05/11/2019  ? Language barrier 12/31/2018  ?  Screening breast examination 12/04/2018  ? Fibroid uterus 12/18/2014  ? Menorrhagia, premenopausal 11/24/2014  ? Iron deficiency anemia 11/24/2014  ? Iron deficiency anemia due to chronic blood loss 09/02/2014  ? Pap smear for cervical cancer screening 09/02/2014  ? Annual physical exam 09/02/2014  ? Dysmenorrhea 02/15/2013  ? Anemia due to Dysmenorrhea 02/15/2013  ? ? ?REFERRING PROVIDER: Gillis Ends* ?  ?REFERRING DIAG: Left-sided low back pain without sciatica, unspecified chronicity  ? ?THERAPY DIAG:  ?Chronic left-sided low back pain without sciatica ? ?Pain in left hip ? ?Muscle weakness (generalized) ? ?Abnormal posture ? ?PERTINENT HISTORY: Left renal mass and and uterine fibroids s/p LAPAROSCOPIC HYSTERECTOMY AND SALPINGECTOMY HAND ASSISTED LAPAROSCOPIC RADICAL NEPHRECTOMY on 02/17/2021 ? ?PRECAUTIONS: None ? ?In person interpreter utilized  ? ?SUBJECTIVE:  ? ?Patient reports she not having much pain right now, sometimes she has pain and sometimes she doesn't. She notes that certain movements cause her pain to increase. She reports she does her exercises, the one where she pulls her knee to her chest causes her pain.  ? ?PAIN:  ?Are you having pain? Yes ?NPRS scale: 6/10 ?Pain location: low back  ?PAIN TYPE: sharp ?Pain description: intermittent  ?Aggravating factors: prolonged sitting, laying on the left side   ?Relieving factors: medication, laying on the right side  ? ?PATIENT GOALS: "for the pain to go away" ? ? ?OBJECTIVE:  ?*Unless otherwise noted all objective measures were captured on initial evaluation.  ? ?  PATIENT SURVEYS:  ?FOTO 28% to 53% ?  ?POSTURE:  ?Slump sitting posture; shifts positions in sitting throughout evaluation due to pain.  ?  ?PALPATION: ?Hypersensitive to palpation of Lt gluteals, piriformis, lumbar paraspinals ?Hypomobility L-spine  ?  ?LUMBARAROM/PROM ?  ?A/PROM A/PROM  ?06/03/2021 06/16/21  ?Flexion 25% limited increased LBP WNL; increased LBP  ?Extension WNL    ?Right lateral flexion 50% limited LBP WNL; no increase in pain  ?Left lateral flexion WNL WNL; no increase in pain  ?Right rotation 50% limited    ?Left rotation 50% limited    ? ?LE AROM/PROM: hip PROM WNL bilaterally, though increased pain with flexion, IR, and ER bilaterally  ?  ?LE MMT: ?  ?MMT Right ?06/03/2021 Left ?06/03/2021  ?Hip flexion 5/5 4-/5 LBP  ?Hip extension Unable to complete prone hip extension due to increased LBP Unable to complete prone hip extension due to increased LBP  ?Hip abduction 4-/5 4-/5   ?Hip adduction      ?Hip internal rotation      ?Hip external rotation      ?Knee flexion      ?Knee extension      ?Ankle dorsiflexion      ?Ankle plantarflexion      ?Ankle inversion      ?Ankle eversion      ? (Blank rows = not tested) ?  ?LUMBAR SPECIAL TESTS:  ?(-) SLR (+) FABER (+) Gaenslen's (-) Sacral Thrust (-) SI Compression/Distraction  ?  ?  ?TODAY'S TREATMENT  ?[facility] Adult PT Treatment:                                                DATE: 06/18/21 ?Therapeutic Exercise: ?NuStep L4 x 5 min with UE/LE while taking subjective ?Hooklying abdominal set with physioball squeeze 2 x 10 with 5 sec hold ?Hooklying clamshell with yellow 2 x 10  ?Hooklying adductor ball squeeze 2 x 10 with 5 sec hold ?Hooklying alternating march 2 x 20 ?LTR 2 x 10 each side ?Bridge with legs on bolster 2 x 10 - partial range, more of a PPT ?Seated lateral abdominal set with physioball 2 x 10 with 5 sec hold each ?Seated ow with blue 2 x 10 ? ? ?[facility] Adult PT Treatment:                                                DATE: 06/16/21 ?Therapeutic Exercise: ?LTR x 2 minutes  ?SKC x 30 sec each ?Figure 4 stretch x 30 sec each  ?Supine pelvic tilts 2 x 10  ?Supine glute sets 2 x 10; 5 sec hold  ?Hip adduction isometric in hooklying 2 x 10; 5 sec hold ?Clamshells LLE only (unable to lay on left side due to pain) 2 x 10; partial range  ? ?06/12/2021: ?Therapeutic Exercise: ?NuStep L3 x 5 min with UE/LE (partial ranges) while  taking subjective ?Trialed hooklying clamshell with yellow but patient began cramping in right hamstring/calf region so discontinued supine exercises ?Seated clamshell with yellow 2 x 5 ?Seated abdominal set with physioball squeeze 2 x 5 with 5 sec hold forward and lateral ?Row with yellow 2 x 10 ?Manual: ?LAD for LLE x 5 bouts ?Trialed PROM for  left hip and hamstring stretch but patient unable to tolerate ?  ?PATIENT EDUCATION:  ?Education details: HEP ?Person educated: Patient, interpreter  ?Education method: Explanation ?Education comprehension: Verbalized understanding ? ?HOME EXERCISE PROGRAM: ?Access Code: [email or web address] ?  ? ?ASSESSMENT: ?CLINICAL IMPRESSION: ?Patient with fair tolerance to therapy secondary to pain, with no adverse effects. She was able to complete all prescribed exercises and stated she felt like she had less pain this visit. Patient continues to report instances of severe pain with inconsistent movements so difficult to address specific aggravating factors. Therapy continues to focus on gradual progression of core and hip strengthening/mobility as patient is able to tolerate. No changes to her HEP this visit. Patient would benefit from continued skilled PT to progress mobility and strength in order to reduce pain and maximize functional ability. ?  ?OBJECTIVE IMPAIRMENTS decreased activity tolerance, decreased knowledge of condition, decreased ROM, decreased strength, hypomobility, increased fascial restrictions, increased muscle spasms, impaired flexibility, postural dysfunction, obesity, and pain.  ?  ?ACTIVITY LIMITATIONS cleaning, community activity, driving, occupation, laundry, and shopping.  ?  ?PERSONAL FACTORS Fitness, Time since onset of injury/illness/exacerbation, and 1 comorbidity: recent surgery  are also affecting patient's functional outcome.  ?  ?  ?GOALS: ?Goals reviewed with patient? No ?  ?SHORT TERM GOALS: ?  ?STG Name Target Date Goal status  ?1 Patient will demonstrate  knowledge and application of appropriate sitting posture to reduce stress on her back.  ?Baseline:  06/24/2021 INITIAL  ?  ?LONG TERM GOALS:  ?  ?LTG Name Target Date Goal status  ?1 Patient will demonstrate pain free lu

## 2021-06-18 ENCOUNTER — Encounter: Payer: Self-pay | Admitting: Physical Therapy

## 2021-06-18 ENCOUNTER — Ambulatory Visit: Payer: Self-pay | Admitting: Physical Therapy

## 2021-06-18 ENCOUNTER — Other Ambulatory Visit: Payer: Self-pay

## 2021-06-18 DIAGNOSIS — M6281 Muscle weakness (generalized): Secondary | ICD-10-CM

## 2021-06-18 DIAGNOSIS — M25552 Pain in left hip: Secondary | ICD-10-CM

## 2021-06-18 DIAGNOSIS — R293 Abnormal posture: Secondary | ICD-10-CM

## 2021-06-18 DIAGNOSIS — M545 Low back pain, unspecified: Secondary | ICD-10-CM

## 2021-06-23 ENCOUNTER — Ambulatory Visit: Payer: Self-pay

## 2021-06-23 ENCOUNTER — Other Ambulatory Visit: Payer: Self-pay

## 2021-06-23 DIAGNOSIS — R293 Abnormal posture: Secondary | ICD-10-CM

## 2021-06-23 DIAGNOSIS — M6281 Muscle weakness (generalized): Secondary | ICD-10-CM

## 2021-06-23 DIAGNOSIS — M25552 Pain in left hip: Secondary | ICD-10-CM

## 2021-06-23 DIAGNOSIS — G8929 Other chronic pain: Secondary | ICD-10-CM

## 2021-06-23 NOTE — Therapy (Signed)
?OUTPATIENT PHYSICAL THERAPY TREATMENT NOTE ? ? ?Patient Name: Mariah Lang ?MRN: [id number] ?DOB:Jul 05, 1970, 51 y.o., female ?Today's Date: 06/23/2021 ? ?PCP: System, Provider Not In ?REFERRING PROVIDER: Gillis Ends* ? ? PT End of Session - 06/23/21 1609   ? ? Visit Number 6   ? Number of Visits 13   ? Date for PT Re-Evaluation 07/17/21   ? Authorization Type CAFA   ? PT Start Time 1615   ? PT Stop Time 4098   ? PT Time Calculation (min) 39 min   ? Activity Tolerance Patient limited by pain   ? Behavior During Therapy [facility] for tasks assessed/performed   ? ?  ?  ? ?  ? ? ? ? ? ?Past Medical History:  ?Diagnosis Date  ? Anemia   ? ?Past Surgical History:  ?Procedure Laterality Date  ? APPENDECTOMY    ? CESAREAN SECTION  2000  ? KIDNEY SURGERY    ? LAPAROSCOPIC NEPHRECTOMY, HAND ASSISTED Left 02/17/2021  ? Procedure: HAND ASSISTED LAPAROSCOPIC RADICAL  NEPHRECTOMY;  Surgeon: Hollice Espy, MD;  Location: [facility];  Service: Urology;  Laterality: Left;  ? LAPAROTOMY  02/17/2021  ? Procedure: LAPAROTOMY OR HAND ASSIST;  Surgeon: Gillis Ends, MD;  Location: [facility];  Service: Gynecology;;  Fibroid excision   ? OTHER SURGICAL HISTORY  1993  ? c-section  ? PARTIAL HYSTERECTOMY    ? ROBOTIC ASSISTED LAPAROSCOPIC HYSTERECTOMY AND SALPINGECTOMY Bilateral 02/17/2021  ? Procedure: XI ROBOTIC ASSISTED LAPAROSCOPIC HYSTERECTOMY AND SALPINGECTOMY;  Surgeon: Gillis Ends, MD;  Location: [facility];  Service: Gynecology;  Laterality: Bilateral;  ? ?Patient Active Problem List  ? Diagnosis Date Noted  ? Hyponatremia 02/20/2021  ? Aspiration pneumonia of both lower lobes due to gastric secretions ([place]) 02/19/2021  ? Pulmonary embolism ([place]) 02/19/2021  ? Acute hypoxemic respiratory failure ([place]) 02/19/2021  ? Renal cell carcinoma ([place]) 02/17/2021  ? Ankle edema, bilateral 09/14/2020  ? Vitamin D deficiency 09/14/2020  ? Thrombocytosis 05/11/2019  ? Language barrier 12/31/2018  ?  Screening breast examination 12/04/2018  ? Fibroid uterus 12/18/2014  ? Menorrhagia, premenopausal 11/24/2014  ? Iron deficiency anemia 11/24/2014  ? Iron deficiency anemia due to chronic blood loss 09/02/2014  ? Pap smear for cervical cancer screening 09/02/2014  ? Annual physical exam 09/02/2014  ? Dysmenorrhea 02/15/2013  ? Anemia due to Dysmenorrhea 02/15/2013  ? ? ?REFERRING PROVIDER: Gillis Ends* ?  ?REFERRING DIAG: Left-sided low back pain without sciatica, unspecified chronicity  ? ?THERAPY DIAG:  ?Chronic left-sided low back pain without sciatica ? ?Pain in left hip ? ?Muscle weakness (generalized) ? ?Abnormal posture ? ?PERTINENT HISTORY: Left renal mass and and uterine fibroids s/p LAPAROSCOPIC HYSTERECTOMY AND SALPINGECTOMY HAND ASSISTED LAPAROSCOPIC RADICAL NEPHRECTOMY on 02/17/2021 ? ?PRECAUTIONS: None ? ?In person interpreter utilized  ? ?SUBJECTIVE:  ?"I am not well at all today. The pain is bad today. I took tylenol and they are not working." She reports waking with increased pain this morning.  ? ?PAIN:  ?Are you having pain? Yes ?NPRS scale: 10/10 ?Pain location: low back  ?PAIN TYPE: throbbing  ?Pain description: intermittent  ?Aggravating factors: unknown  ?Relieving factors: medication ? ?PATIENT GOALS: "for the pain to go away" ? ? ?OBJECTIVE:  ?*Unless otherwise noted all objective measures were captured on initial evaluation.  ? ?PATIENT SURVEYS:  ?FOTO 28% to 53% ?  ?POSTURE:  ?Slump sitting posture; shifts positions in sitting throughout evaluation due to pain.  ?  ?PALPATION: ?Hypersensitive to  palpation of Lt gluteals, piriformis, lumbar paraspinals ?Hypomobility L-spine  ?  ?LUMBARAROM/PROM ?  ?A/PROM A/PROM  ?06/03/2021 06/16/21  ?Flexion 25% limited increased LBP WNL; increased LBP  ?Extension WNL   ?Right lateral flexion 50% limited LBP WNL; no increase in pain  ?Left lateral flexion WNL WNL; no increase in pain  ?Right rotation 50% limited    ?Left rotation 50% limited     ? ?LE AROM/PROM: hip PROM WNL bilaterally, though increased pain with flexion, IR, and ER bilaterally  ?  ?LE MMT: ?  ?MMT Right ?06/03/2021 Left ?06/03/2021  ?Hip flexion 5/5 4-/5 LBP  ?Hip extension Unable to complete prone hip extension due to increased LBP Unable to complete prone hip extension due to increased LBP  ?Hip abduction 4-/5 4-/5   ?Hip adduction      ?Hip internal rotation      ?Hip external rotation      ?Knee flexion      ?Knee extension      ?Ankle dorsiflexion      ?Ankle plantarflexion      ?Ankle inversion      ?Ankle eversion      ? (Blank rows = not tested) ?  ?LUMBAR SPECIAL TESTS:  ?(-) SLR (+) FABER (+) Gaenslen's (-) Sacral Thrust (-) SI Compression/Distraction  ?  ?  ?TODAY'S TREATMENT  ?[facility] Adult PT Treatment:                                                DATE: 06/23/21 ?Therapeutic Exercise: ?Supine pelvic tilts 2 x 10  ?Supine march 2 x 10  ?Hip bridge 2 x 10; partial range  ?Resisted hip abduction hooklying 2 x 10; red band  ?SLR partial range 2 x 10  ?Seated pelvic tilts 2 x 10  ?Updated HEP ? ? ? ?[facility] Adult PT Treatment:                                                DATE: 06/18/21 ?Therapeutic Exercise: ?NuStep L4 x 5 min with UE/LE while taking subjective ?Hooklying abdominal set with physioball squeeze 2 x 10 with 5 sec hold ?Hooklying clamshell with yellow 2 x 10  ?Hooklying adductor ball squeeze 2 x 10 with 5 sec hold ?Hooklying alternating march 2 x 20 ?LTR 2 x 10 each side ?Bridge with legs on bolster 2 x 10 - partial range, more of a PPT ?Seated lateral abdominal set with physioball 2 x 10 with 5 sec hold each ?Seated ow with blue 2 x 10 ? ? ?[facility] Adult PT Treatment:                                                DATE: 06/16/21 ?Therapeutic Exercise: ?LTR x 2 minutes  ?SKC x 30 sec each ?Figure 4 stretch x 30 sec each  ?Supine pelvic tilts 2 x 10  ?Supine glute sets 2 x 10; 5 sec hold  ?Hip adduction isometric in hooklying 2 x 10; 5 sec hold ?Clamshells LLE only (unable to lay  on left side due to pain) 2 x  10; partial range  ? ?  ?PATIENT EDUCATION:  ?Education details: see treatment  ?Person educated: patient/interpreter ?Education method: instruction, demo, cues, handout  ?Education comprehension: returned demo, cues required ? ?HOME EXERCISE PROGRAM: ?Access Code: [email or web address] ?  ? ?ASSESSMENT: ?CLINICAL IMPRESSION: ?Patient arrives with 10/10 left low back pain with fair tolerance to therapy today secondary to pain. She reports initial pain with majority of strengthening, though reports an overall reduction in her pain with continued reps of prescribed exercises. She reported a significant reduction in pain at conclusion of session rated as 4/10. Will continue to progress core and hip strengthening as able, though if patient continues to report high pain levels with inconsistent aggravating/easing factors she will likely require referral back to physician for further assessment of her ongoing pain.  ?  ?OBJECTIVE IMPAIRMENTS decreased activity tolerance, decreased knowledge of condition, decreased ROM, decreased strength, hypomobility, increased fascial restrictions, increased muscle spasms, impaired flexibility, postural dysfunction, obesity, and pain.  ?  ?ACTIVITY LIMITATIONS cleaning, community activity, driving, occupation, laundry, and shopping.  ?  ?PERSONAL FACTORS Fitness, Time since onset of injury/illness/exacerbation, and 1 comorbidity: recent surgery  are also affecting patient's functional outcome.  ?  ?  ?GOALS: ?Goals reviewed with patient? No ?  ?SHORT TERM GOALS: ?  ?STG Name Target Date Goal status  ?1 Patient will demonstrate knowledge and application of appropriate sitting posture to reduce stress on her back.  ?Baseline:  06/24/2021 INITIAL  ?  ?LONG TERM GOALS:  ?  ?LTG Name Target Date Goal status  ?1 Patient will demonstrate pain free lumbar flexion AROM to improve ability to complete bending and reaching activity.  ?Baseline: 07/15/2021 INITIAL  ?2 Patient will  demonstrate at least 4/5 Lt hip strength to improve lumbopelvic stability.  ?Baseline: 07/15/2021 INITIAL  ?3 Patient will tolerate sitting in neutral position for at least 30 minutes  ?Baseline: shifts posit

## 2021-06-24 NOTE — Therapy (Signed)
?OUTPATIENT PHYSICAL THERAPY TREATMENT NOTE ? ? ?Patient Name: Mariah Lang ?MRN: [id number] ?DOB:1971-03-02, 51 y.o., female ?Today's Date: 06/26/2021 ? ?PCP: System, Provider Not In ?REFERRING PROVIDER: Gillis Ends* ? ? PT End of Session - 06/26/21 1028   ? ? Visit Number 7   ? Number of Visits 13   ? Date for PT Re-Evaluation 07/17/21   ? Authorization Type CAFA   ? PT Start Time 1030   ? PT Stop Time 1110   ? PT Time Calculation (min) 40 min   ? Activity Tolerance Patient limited by pain   ? Behavior During Therapy [facility] for tasks assessed/performed   ? ?  ?  ? ?  ? ? ? ? ? ? ?Past Medical History:  ?Diagnosis Date  ? Anemia   ? ?Past Surgical History:  ?Procedure Laterality Date  ? APPENDECTOMY    ? CESAREAN SECTION  2000  ? KIDNEY SURGERY    ? LAPAROSCOPIC NEPHRECTOMY, HAND ASSISTED Left 02/17/2021  ? Procedure: HAND ASSISTED LAPAROSCOPIC RADICAL  NEPHRECTOMY;  Surgeon: Hollice Espy, MD;  Location: [facility] ORS;  Service: Urology;  Laterality: Left;  ? LAPAROTOMY  02/17/2021  ? Procedure: LAPAROTOMY OR HAND ASSIST;  Surgeon: Gillis Ends, MD;  Location: [facility] ORS;  Service: Gynecology;;  Fibroid excision   ? OTHER SURGICAL HISTORY  1993  ? c-section  ? PARTIAL HYSTERECTOMY    ? ROBOTIC ASSISTED LAPAROSCOPIC HYSTERECTOMY AND SALPINGECTOMY Bilateral 02/17/2021  ? Procedure: XI ROBOTIC ASSISTED LAPAROSCOPIC HYSTERECTOMY AND SALPINGECTOMY;  Surgeon: Gillis Ends, MD;  Location: [facility] ORS;  Service: Gynecology;  Laterality: Bilateral;  ? ?Patient Active Problem List  ? Diagnosis Date Noted  ? Hyponatremia 02/20/2021  ? Aspiration pneumonia of both lower lobes due to gastric secretions ([place]) 02/19/2021  ? Pulmonary embolism ([place]) 02/19/2021  ? Acute hypoxemic respiratory failure ([place]) 02/19/2021  ? Renal cell carcinoma ([place]) 02/17/2021  ? Ankle edema, bilateral 09/14/2020  ? Vitamin D deficiency 09/14/2020  ? Thrombocytosis 05/11/2019  ? Language barrier 12/31/2018  ?  Screening breast examination 12/04/2018  ? Fibroid uterus 12/18/2014  ? Menorrhagia, premenopausal 11/24/2014  ? Iron deficiency anemia 11/24/2014  ? Iron deficiency anemia due to chronic blood loss 09/02/2014  ? Pap smear for cervical cancer screening 09/02/2014  ? Annual physical exam 09/02/2014  ? Dysmenorrhea 02/15/2013  ? Anemia due to Dysmenorrhea 02/15/2013  ? ? ?REFERRING PROVIDER: Gillis Ends* ?  ?REFERRING DIAG: Left-sided low back pain without sciatica, unspecified chronicity  ? ?THERAPY DIAG:  ?Chronic left-sided low back pain without sciatica ? ?Pain in left hip ? ?Muscle weakness (generalized) ? ?Abnormal posture ? ?PERTINENT HISTORY: Left renal mass and and uterine fibroids s/p LAPAROSCOPIC HYSTERECTOMY AND SALPINGECTOMY HAND ASSISTED LAPAROSCOPIC RADICAL NEPHRECTOMY on 02/17/2021 ? ?PRECAUTIONS: None ? ?In person interpreter utilized  ? ?SUBJECTIVE:  ?Patient states she is not having as much pain today. She has been doing her exercises at home and she feels like they are helping.  ? ?PAIN:  ?Are you having pain? Yes ?NPRS scale: 4/10 ?Pain location: low back  ?PAIN TYPE: throbbing  ?Pain description: intermittent  ?Aggravating factors: unknown  ?Relieving factors: medication ? ?PATIENT GOALS: "for the pain to go away" ? ? ?OBJECTIVE:  ?*Unless otherwise noted all objective measures were captured on initial evaluation.  ? ?PATIENT SURVEYS:  ?FOTO 31% (28% at intake to 53% predicated) ?  ?POSTURE:  ?Slump sitting posture; shifts positions in sitting throughout evaluation due to pain.  ?  ?PALPATION: ?  Hypersensitive to palpation of Lt gluteals, piriformis, lumbar paraspinals ?Hypomobility L-spine  ?  ?LUMBARAROM/PROM ?  ?A/PROM A/PROM  ?06/03/2021 06/16/21  ?Flexion 25% limited increased LBP WNL; increased LBP  ?Extension WNL   ?Right lateral flexion 50% limited LBP WNL; no increase in pain  ?Left lateral flexion WNL WNL; no increase in pain  ?Right rotation 50% limited    ?Left rotation 50%  limited    ? ?LE AROM/PROM: hip PROM WNL bilaterally, though increased pain with flexion, IR, and ER bilaterally  ?  ?LE MMT: ?  ?MMT Right ?06/03/2021 Left ?06/03/2021  ?Hip flexion 5/5 4-/5 LBP  ?Hip extension Unable to complete prone hip extension due to increased LBP Unable to complete prone hip extension due to increased LBP  ?Hip abduction 4-/5 4-/5   ?Hip adduction      ?Hip internal rotation      ?Hip external rotation      ?Knee flexion      ?Knee extension      ?Ankle dorsiflexion      ?Ankle plantarflexion      ?Ankle inversion      ?Ankle eversion      ? (Blank rows = not tested) ?  ?LUMBAR SPECIAL TESTS:  ?(-) SLR (+) FABER (+) Gaenslen's (-) Sacral Thrust (-) SI Compression/Distraction  ?  ?  ?TODAY'S TREATMENT  ?[facility] Adult PT Treatment:                                                DATE: 06/26/21 ?Therapeutic Exercise: ?NuStep L5 x 5 min with UE/LE while taking subjective ?LTR x 10 each side ?Hooklying abdominal set with physioball press down 2 x 10 ?Hooklying clamshell with red 2 x 10  ?Hooklying abdominal set with adductor ball squeeze 2 x 10 ?Hooklying alternating march with red band 2 x 10 ?Bridge 2 x 10 - partial range, more of a PPT ?SLR 2 x 10 each - partial range on left due to pain ?Sidelying hip abduction 2 x 5 left only ?Seated lateral abdominal set with physioball 2 x 10 ?Seated row with blue 2 x 10 ?Seated pallof press with blue 2 x 10 each ?Sit to stand x 10 ? ? ?[facility] Adult PT Treatment:                                                DATE: 06/23/21 ?Therapeutic Exercise: ?Supine pelvic tilts 2 x 10  ?Supine march 2 x 10  ?Hip bridge 2 x 10; partial range  ?Resisted hip abduction hooklying 2 x 10; red band  ?SLR partial range 2 x 10  ?Seated pelvic tilts 2 x 10  ?Updated HEP ? ?[facility] Adult PT Treatment:                                                DATE: 06/18/21 ?Therapeutic Exercise: ?NuStep L4 x 5 min with UE/LE while taking subjective ?Hooklying abdominal set with physioball squeeze 2 x 10  with 5 sec hold ?Hooklying clamshell with yellow 2 x 10  ?Hooklying adductor ball squeeze 2  x 10 with 5 sec hold ?Hooklying alternating march 2 x 20 ?LTR 2 x 10 each side ?Bridge with legs on bolster 2 x 10 - partial range, more of a PPT ?Seated lateral abdominal set with physioball 2 x 10 with 5 sec hold each ?Seated row with blue 2 x 10 ? ?PATIENT EDUCATION:  ?Education details: see treatment  ?Person educated: patient/interpreter ?Education method: instruction, demo, cues, handout  ?Education comprehension: returned demo, cues required ? ?HOME EXERCISE PROGRAM: ?Access Code: [email or web address] ?  ? ?ASSESSMENT: ?CLINICAL IMPRESSION: ?Patient with better tolerance to therapy but still with occurrences of severe pain, no adverse effects reported. She did report reduced pain this visit but continues to be limited with exercises due to instances of severe left low back/hip pain. It is still unclear etiology of pain due to randomness of onset. She was able to tolerate more exercise this visit with increase in resistance. No changes made to HEP this visit. Overall, she reports improvment of her functional ability on FOTO compared to evaluation. Patient would benefit from continued skilled PT to progress mobility and strength in order to reduce pain and maximize functional ability. ?  ?OBJECTIVE IMPAIRMENTS decreased activity tolerance, decreased knowledge of condition, decreased ROM, decreased strength, hypomobility, increased fascial restrictions, increased muscle spasms, impaired flexibility, postural dysfunction, obesity, and pain.  ?  ?ACTIVITY LIMITATIONS cleaning, community activity, driving, occupation, laundry, and shopping.  ?  ?PERSONAL FACTORS Fitness, Time since onset of injury/illness/exacerbation, and 1 comorbidity: recent surgery  are also affecting patient's functional outcome.  ?  ?  ?GOALS: ?Goals reviewed with patient? No ?  ?SHORT TERM GOALS: ?  ?STG Name Target Date Goal status  ?1 Patient will demonstrate  knowledge and application of appropriate sitting posture to reduce stress on her back.  ?Baseline:  ?06/26/2021: patient reports independence with HEP 06/24/2021 MET  ?  ?LONG TERM GOALS:  ?  ?LTG Name [place]

## 2021-06-26 ENCOUNTER — Ambulatory Visit: Payer: Self-pay | Admitting: Physical Therapy

## 2021-06-26 ENCOUNTER — Other Ambulatory Visit: Payer: Self-pay

## 2021-06-26 ENCOUNTER — Encounter: Payer: Self-pay | Admitting: Physical Therapy

## 2021-06-26 DIAGNOSIS — M6281 Muscle weakness (generalized): Secondary | ICD-10-CM

## 2021-06-26 DIAGNOSIS — G8929 Other chronic pain: Secondary | ICD-10-CM

## 2021-06-26 DIAGNOSIS — R293 Abnormal posture: Secondary | ICD-10-CM

## 2021-06-26 DIAGNOSIS — M25552 Pain in left hip: Secondary | ICD-10-CM

## 2021-06-28 ENCOUNTER — Ambulatory Visit (HOSPITAL_COMMUNITY)
Admission: RE | Admit: 2021-06-28 | Discharge: 2021-06-28 | Disposition: A | Discharge status: Home / Self Care | Payer: Self-pay | Source: Ambulatory Visit | Attending: Family Medicine | Admitting: Family Medicine

## 2021-06-28 ENCOUNTER — Emergency Department (HOSPITAL_COMMUNITY)
Admission: EM | Admit: 2021-06-28 | Discharge: 2021-06-28 | Disposition: A | Discharge status: Home / Self Care | Payer: Self-pay | Attending: Emergency Medicine | Admitting: Emergency Medicine

## 2021-06-28 ENCOUNTER — Ambulatory Visit: Payer: Self-pay | Attending: Family Medicine | Admitting: Family Medicine

## 2021-06-28 ENCOUNTER — Other Ambulatory Visit: Payer: Self-pay

## 2021-06-28 ENCOUNTER — Ambulatory Visit (HOSPITAL_COMMUNITY)
Admit: 2021-06-28 | Discharge: 2021-06-28 | Disposition: A | Discharge status: Home / Self Care | Payer: Self-pay | Source: Ambulatory Visit | Attending: Family Medicine | Admitting: Family Medicine

## 2021-06-28 ENCOUNTER — Encounter (HOSPITAL_COMMUNITY): Payer: Self-pay | Admitting: Emergency Medicine

## 2021-06-28 VITALS — BP 117/77 | HR 78 | Ht 59.0 in | Wt 197.0 lb

## 2021-06-28 DIAGNOSIS — Z85528 Personal history of other malignant neoplasm of kidney: Secondary | ICD-10-CM

## 2021-06-28 DIAGNOSIS — Z5321 Procedure and treatment not carried out due to patient leaving prior to being seen by health care provider: Secondary | ICD-10-CM | POA: Insufficient documentation

## 2021-06-28 DIAGNOSIS — M47814 Spondylosis without myelopathy or radiculopathy, thoracic region: Secondary | ICD-10-CM | POA: Insufficient documentation

## 2021-06-28 DIAGNOSIS — I2699 Other pulmonary embolism without acute cor pulmonale: Secondary | ICD-10-CM

## 2021-06-28 DIAGNOSIS — M25559 Pain in unspecified hip: Secondary | ICD-10-CM | POA: Insufficient documentation

## 2021-06-28 DIAGNOSIS — M47817 Spondylosis without myelopathy or radiculopathy, lumbosacral region: Secondary | ICD-10-CM | POA: Insufficient documentation

## 2021-06-28 DIAGNOSIS — R7303 Prediabetes: Secondary | ICD-10-CM

## 2021-06-28 DIAGNOSIS — M545 Low back pain, unspecified: Secondary | ICD-10-CM

## 2021-06-28 DIAGNOSIS — G8929 Other chronic pain: Secondary | ICD-10-CM

## 2021-06-28 LAB — POCT GLYCOSYLATED HEMOGLOBIN (HGB A1C): HbA1c, POC (controlled diabetic range): 6.1 % (ref 0.0–7.0)

## 2021-06-28 IMAGING — DX DG LUMBAR SPINE COMPLETE 4+V
5 series · 5 of 5 positions shown · non-contrast
Comparison: [DATE].

CLINICAL DATA: Low back pain radiating down left leg x 2 months.

EXAM:
LUMBAR SPINE - COMPLETE 4+ VIEW

[l-spine ap]
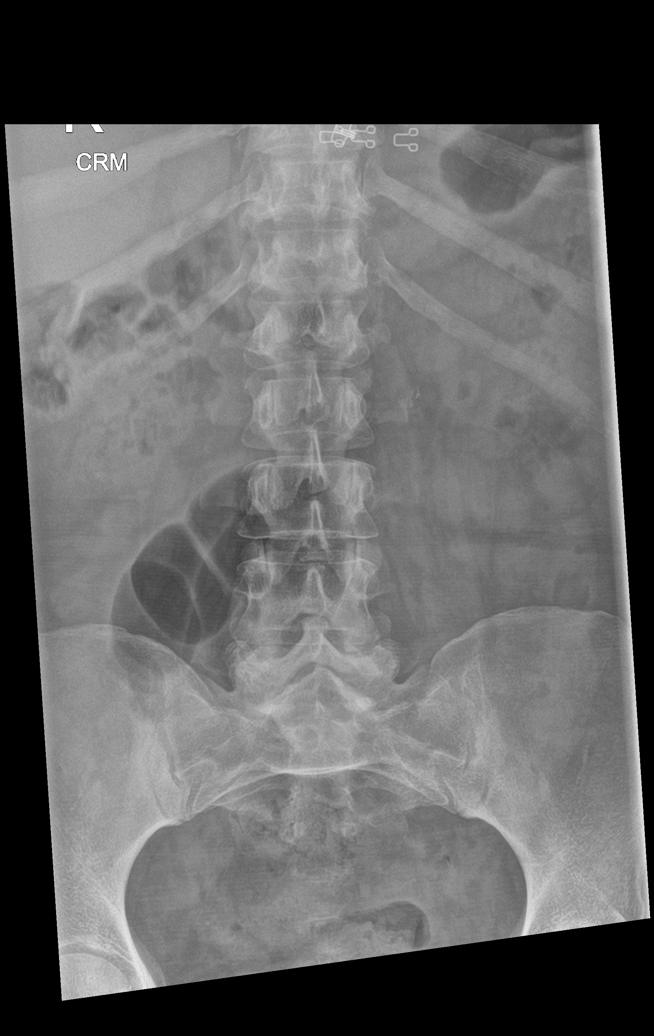

[l-spine obl (1 of 2)]
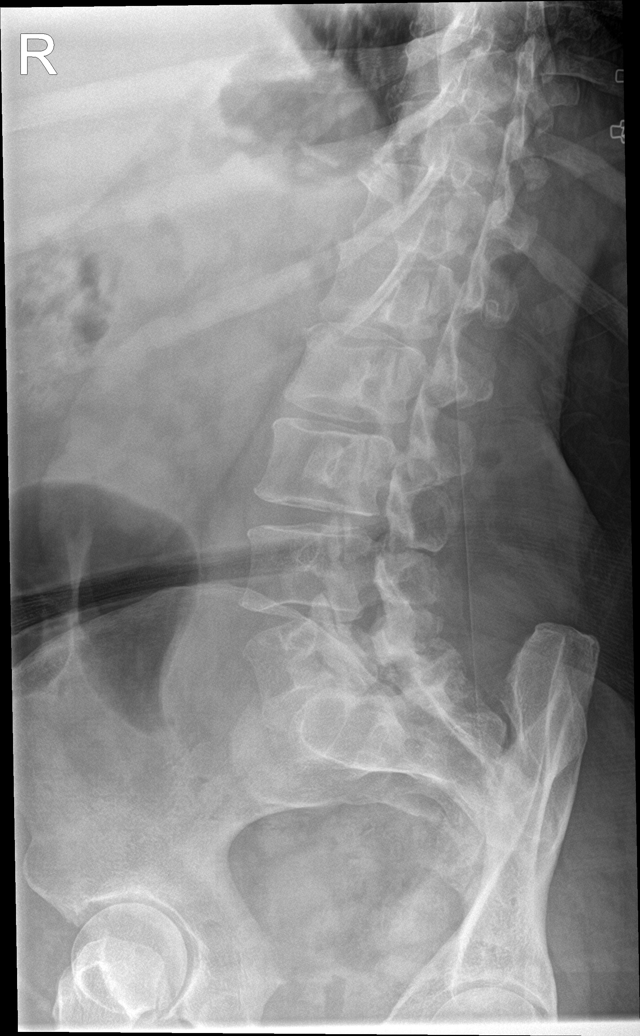

[l-spine obl (2 of 2)]
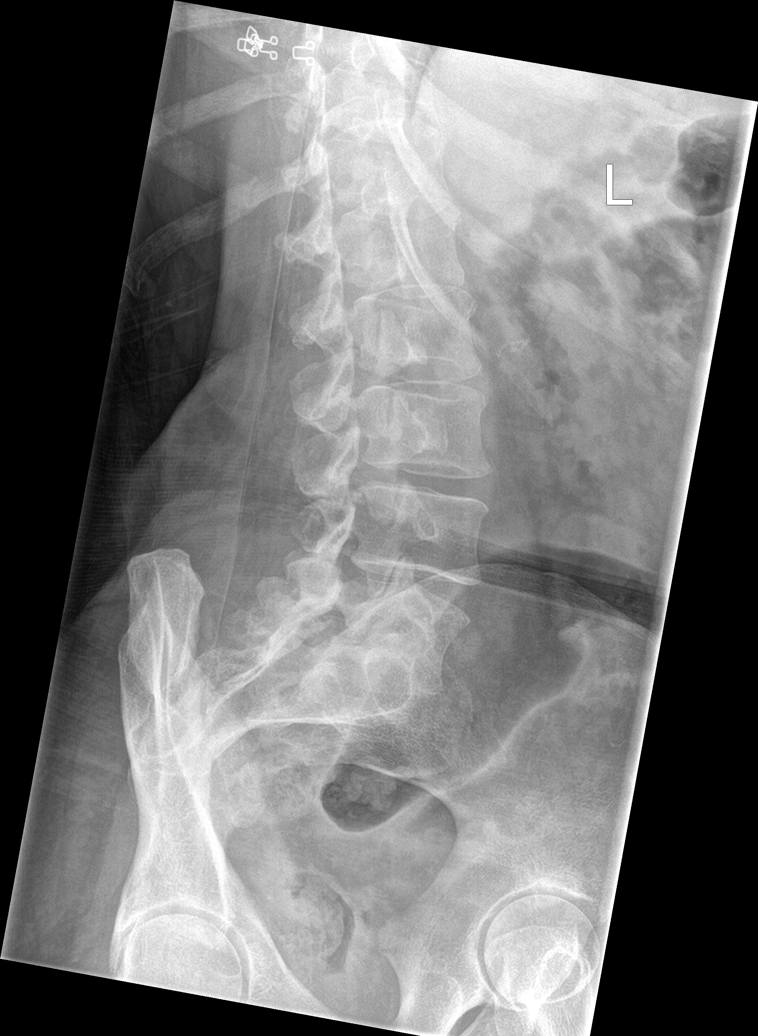

[l-spine lat (1 of 2)]
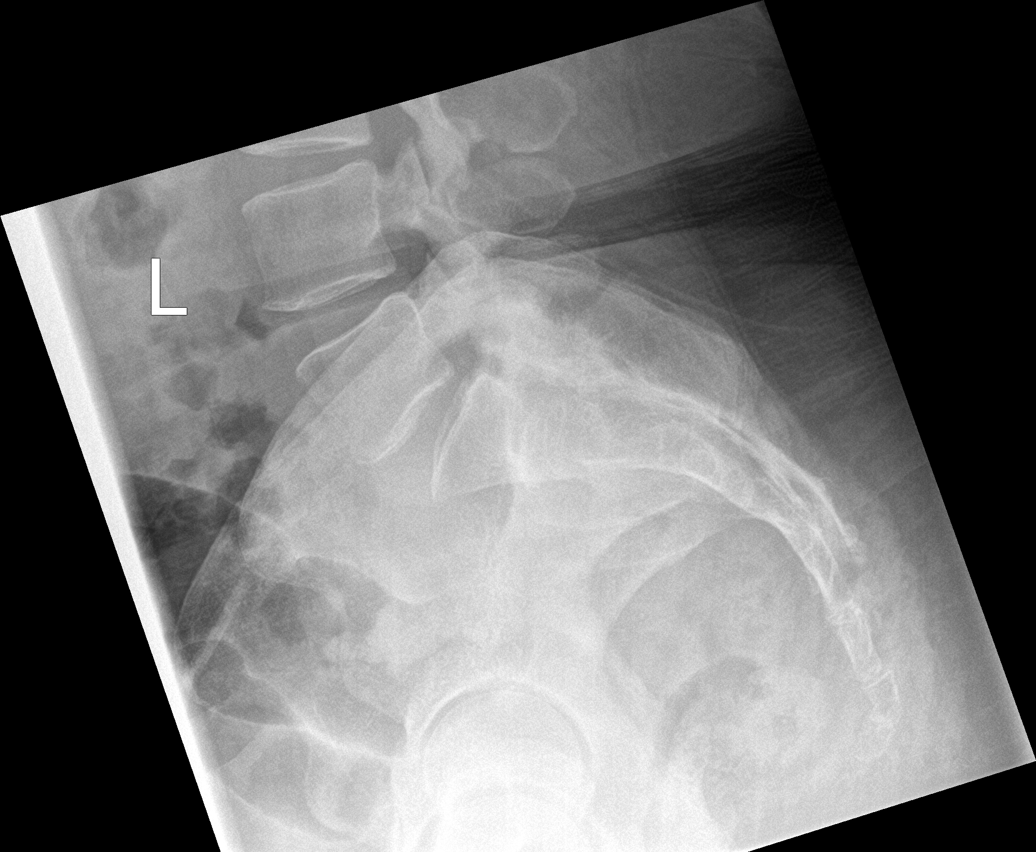

[l-spine lat (2 of 2)]
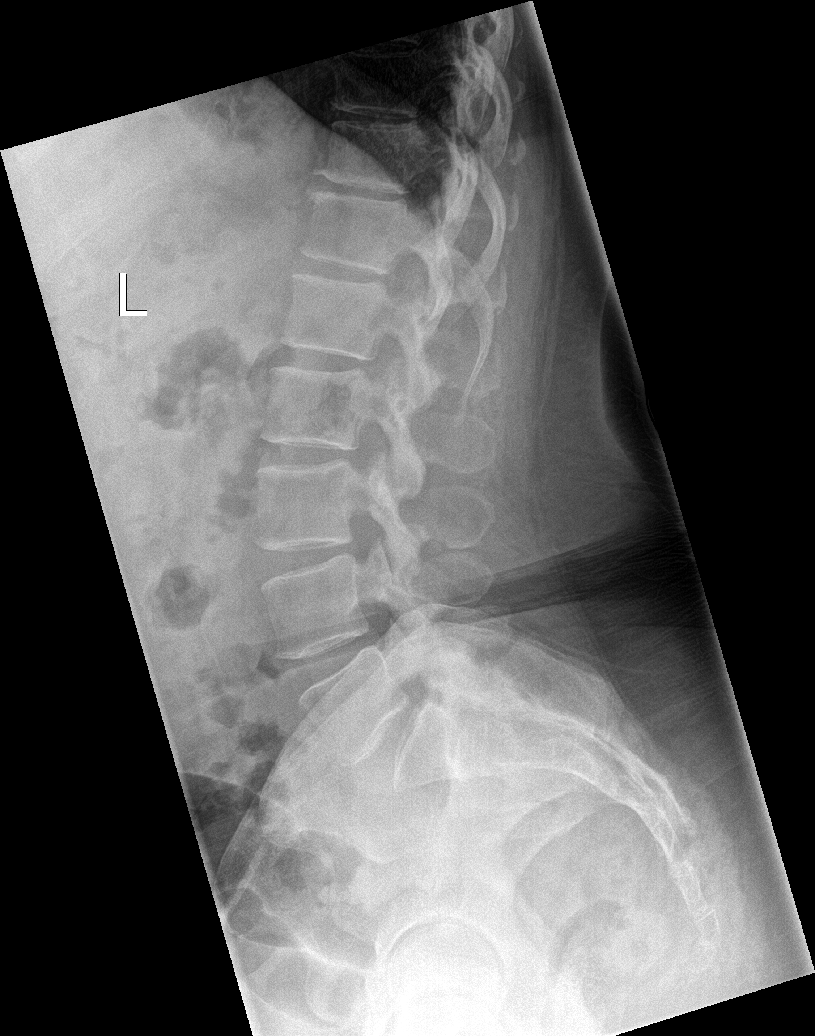

[5 of 5 positions shown; findings below may reference images not displayed]

FINDINGS: There is no evidence of lumbar spine fracture. There is mild
anterolisthesis at L5-S1. Intervertebral disc space narrowing and
degenerative endplate changes are present at T11-T12. Facet
arthropathy is present in the lower lumbar spine at L5-S1.
IMPRESSION: 1. No acute fracture.
2. Mild degenerative changes at T11-T12 and L5-S1.

## 2021-06-28 MED ORDER — METHOCARBAMOL 500 MG PO TABS
500.0000 mg | ORAL_TABLET | Freq: Three times a day (TID) | ORAL | 3 refills | Status: DC | PRN
  Filled 2021-06-28: qty 60, 20d supply, fill #0

## 2021-06-28 MED ORDER — APIXABAN 5 MG PO TABS
5.0000 mg | ORAL_TABLET | Freq: Two times a day (BID) | ORAL | 0 refills | Status: DC
  Filled 2021-06-28: qty 30, 15d supply, fill #0

## 2021-06-28 NOTE — Progress Notes (Signed)
? ?Subjective:  ?Patient ID: Mariah Lang, female    DOB: 25-Dec-1970  Age: 51 y.o. MRN: [id number] ? ?CC: Back Pain ? ? ?HPI ?Leylanie Woodmansee Jarome Lamas is a 51 y.o. year old female with a history of plantar fasciitis, renal cell carcinoma (status post laparoscopic left radical nephrectomy) s/p  hysterectomy for uterine fibroids,pulmonary embolism (diagnosed in 02/2021) ? ?Interval History: ? ?She is requesting pain medication for her lower back. Seen at [facility] for lower back pain in 04/2021 and prescribed Robaxin which has been helpful.  Pain is in lumbar spine and radiates up to thoracic spine but not to her lower extremities.  She has no numbness in her legs or history of falls.  Currently undergoing physical therapy for her back. ?Seen by Oncology in 04/2021 and notes reviewed ? ?MR Pelvis w/w/o contrast 04/2021 ?IMPRESSION: ?Hysterectomy and surgical resection of left adnexal mass ?demonstrated since prior study. ?  ?No evidence of pelvic mass, fluid collections, or other significant ?abnormality. ?  ? ?Endorses compliance with Eliquis for the last month ?She is a difficult historian and it appears she has been off and on her Eliquis intermittently and is difficult to ascertain if she has completed a total of 3 months of anticoagulation since her diagnosis of PE in 02/2021 ?She had a staple poke her finger 2 days ago when she is requesting Tdap vaccine.  Denies erythema or tenderness at the site. ?Past Medical History:  ?Diagnosis Date  ? Anemia   ? ? ?Past Surgical History:  ?Procedure Laterality Date  ? APPENDECTOMY    ? CESAREAN SECTION  2000  ? KIDNEY SURGERY    ? LAPAROSCOPIC NEPHRECTOMY, HAND ASSISTED Left 02/17/2021  ? Procedure: HAND ASSISTED LAPAROSCOPIC RADICAL  NEPHRECTOMY;  Surgeon: Hollice Espy, MD;  Location: [facility] ORS;  Service: Urology;  Laterality: Left;  ? LAPAROTOMY  02/17/2021  ? Procedure: LAPAROTOMY OR HAND ASSIST;  Surgeon: Gillis Ends, MD;  Location: [facility]  ORS;  Service: Gynecology;;  Fibroid excision   ? OTHER SURGICAL HISTORY  1993  ? c-section  ? PARTIAL HYSTERECTOMY    ? ROBOTIC ASSISTED LAPAROSCOPIC HYSTERECTOMY AND SALPINGECTOMY Bilateral 02/17/2021  ? Procedure: XI ROBOTIC ASSISTED LAPAROSCOPIC HYSTERECTOMY AND SALPINGECTOMY;  Surgeon: Gillis Ends, MD;  Location: [facility] ORS;  Service: Gynecology;  Laterality: Bilateral;  ? ? ?Family History  ?Problem Relation Age of Onset  ? Migraines Mother   ? Hypertension Mother   ? Migraines Father   ? Hypertension Father   ? Diabetes Mother   ? Diabetes Paternal Grandmother   ? ? ?Social History  ? ?Socioeconomic History  ? Marital status: Divorced  ?  Spouse name: Not on file  ? Number of children: 2  ? Years of education: Not on file  ? Highest education level: 9th grade  ?Occupational History  ? Not on file  ?Tobacco Use  ? Smoking status: Never  ? Smokeless tobacco: Never  ?Vaping Use  ? Vaping Use: Never used  ?Substance and Sexual Activity  ? Alcohol use: Not Currently  ? Drug use: Not Currently  ? Sexual activity: Yes  ?  Birth control/protection: Surgical, None  ?Other Topics Concern  ? Not on file  ?Social History Narrative  ? ** Merged History Encounter **  ?    ? ** Merged History Encounter **  ?    ? ?Social Determinants of Health  ? ?Financial Resource Strain: Not on file  ?Food Insecurity: Not on file  ?Transportation Needs: Not  on file  ?Physical Activity: Not on file  ?Stress: Not on file  ?Social Connections: Not on file  ? ? ?No Known Allergies ? ?Outpatient Medications Prior to Visit  ?Medication Sig Dispense Refill  ? apixaban (ELIQUIS) 5 MG TABS tablet Take 1 tablet (5 mg total) by mouth 2 (two) times daily. 60 tablet 2  ? docusate sodium (COLACE) 100 MG capsule Take 1 capsule (100 mg total) by mouth 2 (two) times daily. (Patient not taking: Reported on 05/26/2021) 30 capsule 0  ? HYDROcodone-acetaminophen (NORCO/VICODIN) 5-325 MG tablet Take 1 tablet by mouth every 6 (six) hours as needed.  (Patient not taking: Reported on 06/28/2021) 10 tablet 0  ? NON FORMULARY Reports one prescribed medicine, but does not know the name of medicine (Patient not taking: Reported on 06/28/2021)    ? methocarbamol (ROBAXIN) 500 MG tablet Take 1 tablet (500 mg total) by mouth at bedtime as needed for muscle spasms. (Patient not taking: Reported on 06/28/2021) 20 tablet 0  ? ?No facility-administered medications prior to visit.  ? ? ? ?ROS ?Review of Systems  ?Constitutional:  Negative for activity change, appetite change and fatigue.  ?HENT:  Negative for congestion, sinus pressure and sore throat.   ?Eyes:  Negative for visual disturbance.  ?Respiratory:  Negative for cough, chest tightness, shortness of breath and wheezing.   ?Cardiovascular:  Negative for chest pain and palpitations.  ?Gastrointestinal:  Negative for abdominal distention, abdominal pain and constipation.  ?Endocrine: Negative for polydipsia.  ?Genitourinary:  Negative for dysuria and frequency.  ?Musculoskeletal:  Positive for back pain. Negative for arthralgias.  ?Skin:  Negative for rash.  ?Neurological:  Negative for tremors, light-headedness and numbness.  ?Hematological:  Does not bruise/bleed easily.  ?Psychiatric/Behavioral:  Negative for agitation and behavioral problems.   ? ?Objective:  ?BP 117/77   Pulse 78   Ht '4\' 11"'$  (1.499 m)   Wt 197 lb (89.4 kg)   LMP 01/18/2019   SpO2 97%   BMI 39.79 kg/m?  ? ? ?  06/28/2021  ?  1:41 PM 05/26/2021  ?  3:04 PM 04/21/2021  ? 10:32 AM  ?BP/Weight  ?Systolic BP [phone]  ?Diastolic BP 77 90 77  ?Wt. (Lbs) 197 193.5 189.4  ?BMI 39.79 kg/m2 39.08 kg/m2 38.25 kg/m2  ? ? ? ? ?Physical Exam ?Constitutional:   ?   Appearance: She is well-developed.  ?Cardiovascular:  ?   Rate and Rhythm: Normal rate.  ?   Heart sounds: Normal heart sounds. No murmur heard. ?Pulmonary:  ?   Effort: Pulmonary effort is normal.  ?   Breath sounds: Normal breath sounds. No wheezing or rales.  ?Chest:  ?   Chest wall: No  tenderness.  ?Abdominal:  ?   General: Bowel sounds are normal. There is no distension.  ?   Palpations: Abdomen is soft. There is no mass.  ?   Tenderness: There is no abdominal tenderness.  ?Musculoskeletal:  ?   Right lower leg: No edema.  ?   Left lower leg: No edema.  ?   Comments: Tenderness to palpation of lumbar spine ?Negative straight leg raise bilaterally  ?Neurological:  ?   Mental Status: She is alert and oriented to person, place, and time.  ?Psychiatric:     ?   Mood and Affect: Mood normal.  ? ? ? ?  Latest Ref Rng & Units 03/04/2021  ? 10:04 PM 02/21/2021  ?  5:08 AM 02/20/2021  ?  8:30 AM  ?  CMP  ?Glucose 70 - 99 mg/dL 102   139   112    ?BUN 6 - 20 mg/dL '17   9   8    '$ ?Creatinine 0.44 - 1.00 mg/dL 0.75   0.72   0.75    ?Sodium 135 - 145 mmol/L 133   136   132    ?Potassium 3.5 - 5.1 mmol/L 4.3   4.2   4.0    ?Chloride 98 - 111 mmol/L 101   99   97    ?CO2 22 - 32 mmol/L '26   29   27    '$ ?Calcium 8.9 - 10.3 mg/dL 9.0   8.7   8.8    ?Total Protein 6.5 - 8.1 g/dL 8.3      ?Total Bilirubin 0.3 - 1.2 mg/dL 0.6      ?Alkaline Phos 38 - 126 U/L 80      ?AST 15 - 41 U/L 24      ?ALT 0 - 44 U/L 32      ? ? ?Lipid Panel  ?   ?Component Value Date/Time  ? CHOL 189 09/14/2020 1011  ? TRIG 190 (H) 09/14/2020 1011  ? HDL 46 09/14/2020 1011  ? CHOLHDL 4.1 09/14/2020 1011  ? CHOLHDL 3.3 09/02/2014 1111  ? VLDL 14 09/02/2014 1111  ? LDLCALC 110 (H) 09/14/2020 1011  ? ? ?CBC ?   ?Component Value Date/Time  ? WBC 9.5 03/04/2021 2204  ? RBC 5.05 03/04/2021 2204  ? HGB 13.3 03/04/2021 2204  ? HGB 14.6 09/14/2020 1011  ? HCT 41.6 03/04/2021 2204  ? HCT 48.4 (H) 09/14/2020 1011  ? PLT 594 (H) 03/04/2021 2204  ? PLT 449 09/14/2020 1011  ? MCV 82.4 03/04/2021 2204  ? MCV 83 09/14/2020 1011  ? MCH 26.3 03/04/2021 2204  ? MCHC 32.0 03/04/2021 2204  ? RDW 16.5 (H) 03/04/2021 2204  ? RDW 17.5 (H) 09/14/2020 1011  ? LYMPHSABS 3.0 03/04/2021 2204  ? LYMPHSABS 2.0 09/14/2020 1011  ? MONOABS 1.3 (H) 03/04/2021 2204  ? EOSABS 0.0  03/04/2021 2204  ? EOSABS 0.1 09/14/2020 1011  ? BASOSABS 0.1 03/04/2021 2204  ? BASOSABS 0.1 09/14/2020 1011  ? ? ?Lab Results  ?Component Value Date  ? HGBA1C 5.7 (A) 09/14/2020  ? ? ?Assessment & Plan:  ?1. Chronic m

## 2021-06-28 NOTE — Therapy (Signed)
?OUTPATIENT PHYSICAL THERAPY TREATMENT NOTE ? ? ?Patient Name: Mariah Lang ?MRN: [id number] ?DOB:30-Aug-1970, 51 y.o., female ?Today's Date: 06/30/2021 ? ?PCP: System, Provider Not In ?REFERRING PROVIDER: Gillis Ends* ? ? PT End of Session - 06/30/21 1450   ? ? Visit Number 8   ? Number of Visits 13   ? Date for PT Re-Evaluation 07/17/21   ? Authorization Type CAFA   ? PT Start Time 6808   ? PT Stop Time 1525   ? PT Time Calculation (min) 40 min   ? Activity Tolerance Patient limited by pain   ? Behavior During Therapy [facility] for tasks assessed/performed   ? ?  ?  ? ?  ? ? ? ? ? ? ? ?Past Medical History:  ?Diagnosis Date  ? Anemia   ? ?Past Surgical History:  ?Procedure Laterality Date  ? APPENDECTOMY    ? CESAREAN SECTION  2000  ? KIDNEY SURGERY    ? LAPAROSCOPIC NEPHRECTOMY, HAND ASSISTED Left 02/17/2021  ? Procedure: HAND ASSISTED LAPAROSCOPIC RADICAL  NEPHRECTOMY;  Surgeon: Hollice Espy, MD;  Location: [facility] ORS;  Service: Urology;  Laterality: Left;  ? LAPAROTOMY  02/17/2021  ? Procedure: LAPAROTOMY OR HAND ASSIST;  Surgeon: Gillis Ends, MD;  Location: [facility] OR[facility];  Service: Gynecology;;  Fibroid excision   ? OTHER SURGICAL HISTORY  1993  ? c-section  ? PARTIAL HYSTERECTOMY    ? ROBOTIC ASSISTED LAPAROSCOPIC HYSTERECTOMY AND SALPINGECTOMY Bilateral 02/17/2021  ? Procedure: XI ROBOTIC ASSISTED LAPAROSCOPIC HYSTERECTOMY AND SALPINGECTOMY;  Surgeon: Gillis Ends, MD;  Location: [facility] OR[facility];  Service: Gynecology;  Laterality: Bilateral;  ? ?Patient Active Problem List  ? Diagnosis Date Noted  ? Hyponatremia 02/20/2021  ? Aspiration pneumonia of both lower lobes due to gastric secretions ([place]) 02/19/2021  ? Pulmonary embolism ([place]) 02/19/2021  ? Acute hypoxemic respiratory failure ([place]) 02/19/2021  ? Renal cell carcinoma ([place]) 02/17/2021  ? Ankle edema, bilateral 09/14/2020  ? Vitamin D deficiency 09/14/2020  ? Thrombocytosis 05/11/2019  ? Language barrier 12/31/2018  ?  Screening breast examination 12/04/2018  ? Fibroid uterus 12/18/2014  ? Menorrhagia, premenopausal 11/24/2014  ? Iron deficiency anemia 11/24/2014  ? Iron deficiency anemia due to chronic blood loss 09/02/2014  ? Pap smear for cervical cancer screening 09/02/2014  ? Annual physical exam 09/02/2014  ? Dysmenorrhea 02/15/2013  ? Anemia due to Dysmenorrhea 02/15/2013  ? ? ?REFERRING PROVIDER: Gillis Ends* ?  ?REFERRING DIAG: Left-sided low back pain without sciatica, unspecified chronicity  ? ?THERAPY DIAG:  ?Chronic left-sided low back pain without sciatica ? ?Pain in left hip ? ?Muscle weakness (generalized) ? ?PERTINENT HISTORY: Left renal mass and and uterine fibroids s/p LAPAROSCOPIC HYSTERECTOMY AND SALPINGECTOMY HAND ASSISTED LAPAROSCOPIC RADICAL NEPHRECTOMY on 02/17/2021 ? ?PRECAUTIONS: None ? ?In person interpreter utilized  ?SUBJECTIVE:  ?Patient reports increased pain this visit that has been ongoing since Monday (3/27) after she saw the doctor and had an x-ray. She has not heard back from the doctor regarding the imaging results. Unclear what causes the pain, it will randomly occur with movements and it is not always there. ? ?PAIN:  ?Are you having pain? Yes ?NPRS scale: 9/10 ?Pain location: low back  ?PAIN TYPE: throbbing  ?Pain description: intermittent  ?Aggravating factors: unknown  ?Relieving factors: medication ? ?PATIENT GOALS: "for the pain to go away" ? ? ?OBJECTIVE:  ?*Unless otherwise noted all objective measures were captured on initial evaluation.  ? ?PATIENT SURVEYS:  ?FOTO 31% (28% at intake to  53% predicated) ?  ?POSTURE:  ?Slump sitting posture; shifts positions in sitting throughout evaluation due to pain.  ?  ?PALPATION: ?Hypersensitive to palpation of Lt gluteals, piriformis, lumbar paraspinals ?Hypomobility L-spine  ?  ?LUMBARAROM/PROM ?  ?A/PROM A/PROM  ?06/03/2021 06/16/21  ?Flexion 25% limited increased LBP WNL; increased LBP  ?Extension WNL   ?Right lateral flexion 50%  limited LBP WNL; no increase in pain  ?Left lateral flexion WNL WNL; no increase in pain  ?Right rotation 50% limited    ?Left rotation 50% limited    ? ?LE AROM/PROM: hip PROM WNL bilaterally, though increased pain with flexion, IR, and ER bilaterally  ?  ?LE MMT: ?  ?MMT Right ?06/03/2021 Left ?06/03/2021  ?Hip flexion 5/5 4-/5 LBP  ?Hip extension Unable to complete prone hip extension due to increased LBP Unable to complete prone hip extension due to increased LBP  ?Hip abduction 4-/5 4-/5   ?Hip adduction      ?Hip internal rotation      ?Hip external rotation      ?Knee flexion      ?Knee extension      ?Ankle dorsiflexion      ?Ankle plantarflexion      ?Ankle inversion      ?Ankle eversion      ? (Blank rows = not tested) ?  ?LUMBAR SPECIAL TESTS:  ?(-) SLR (+) FABER (+) Gaenslen's (-) Sacral Thrust (-) SI Compression/Distraction  ?  ?  ?TODAY'S TREATMENT  ?[facility] Adult PT Treatment:                                                DATE: 06/30/21 ?Therapeutic Exercise: ?NuStep L5 x 5 min with UE/LE while taking subjective ?Supine pelvic tilt 2 x 10 with 5 sec hold ?Supine alternating march 2 x 10 ?Supine bent knee fall out alternating 2 x 10 ?LTR 2 x 10 ?Hooklying clamshell with red 2 x 10  ?Hooklying adduction ball squeeze 2 x 10 ?SLR 2 x 5 each - partial range on left due to pain ?Bridge 2 x 5 - partial range ?Seated pelvic tilts 2 x 10 ?Seated row with red 2 x 10 ? ? ?[facility] Adult PT Treatment:                                                DATE: 06/26/21 ?Therapeutic Exercise: ?NuStep L5 x 5 min with UE/LE while taking subjective ?LTR x 10 each side ?Hooklying abdominal set with physioball press down 2 x 10 ?Hooklying clamshell with red 2 x 10  ?Hooklying abdominal set with adductor ball squeeze 2 x 10 ?Hooklying alternating march with red band 2 x 10 ?Bridge 2 x 10 - partial range, more of a PPT ?SLR 2 x 10 each - partial range on left due to pain ?Sidelying hip abduction 2 x 5 left only ?Seated lateral abdominal  set with physioball 2 x 10 ?Seated row with blue 2 x 10 ?Seated pallof press with blue 2 x 10 each ?Sit to stand x 10 ? ?[facility] Adult PT Treatment:  DATE: 06/23/21 ?Therapeutic Exercise: ?Supine pelvic tilts 2 x 10  ?Supine march 2 x 10  ?Hip bridge 2 x 10; partial range  ?Resisted hip abduction hooklying 2 x 10; red band  ?SLR partial range 2 x 10  ?Seated pelvic tilts 2 x 10  ?Updated HEP ? ?PATIENT EDUCATION:  ?Education details: HEP ?Person educated: patient/interpreter ?Education method: instruction, demo, cues, handout  ?Education comprehension: returned demo, cues required ? ?HOME EXERCISE PROGRAM: ?Access Code: [email or web address] ?  ? ?ASSESSMENT: ?CLINICAL IMPRESSION: ?Patient limited in therapy this visit due to increased pain, no adverse effects reported with therapy. She reports instances of sharp severe pain with movements and she doesn't know when it will occur. It is unclear exact mechanism of pain as she doesn't now when pain will occur. Exercises slightly regressed this visit due to pain level, and therapy continues to focus primarily on general core and hip activation/strengthening as tolerated. Patient with 1-2 instances of severe pain during therapy this visit, occurring with more rotation movements it seems. No changes to HEP, encouraged to perform as tolerated. Patient does report improvement in pain with therapy and exercise. Patient would benefit from continued skilled PT to progress mobility and strength in order to reduce pain and maximize functional ability. ? ?  ?OBJECTIVE IMPAIRMENTS decreased activity tolerance, decreased knowledge of condition, decreased ROM, decreased strength, hypomobility, increased fascial restrictions, increased muscle spasms, impaired flexibility, postural dysfunction, obesity, and pain.  ?  ?ACTIVITY LIMITATIONS cleaning, community activity, driving, occupation, laundry, and shopping.  ?  ?PERSONAL FACTORS Fitness, Time since  onset of injury/illness/exacerbation, and 1 comorbidity: recent surgery  are also affecting patient's functional outcome.  ?  ?  ?GOALS: ?Goals reviewed with patient? No ?  ?SHORT TERM GOALS: ?  ?STG Name Ta

## 2021-06-28 NOTE — Patient Instructions (Addendum)
Plan de alimentaci?n para personas con prediabetes ?Prediabetes Eating Plan ?La prediabetes es una afecci?n que hace que los niveles de az?car en la sangre (glucosa) sean m?s altos de lo normal. Esto aumenta el riesgo de desarrollar diabetes tipo 2 (diabetes mellitus tipo 2). Trabajar con un profesional de la salud o especialista en nutrici?n (nutricionista) para Teacher, English as a foreign language en la dieta y el estilo de vida puede ayudar a prevenir el inicio de la diabetes. Estos cambios pueden ayudarlo a: ?Environmental consultant de glucemia. ?Mejorar los niveles de [place]. ?Controlar la presi?n arterial. ?Consejos para seguir este plan ?Al leer las etiquetas de los alimentos ?Lea las etiquetas de los alimentos envasados para controlar la cantidad de [place], sal (sodio) y az?car que contienen. Evite los alimentos que contengan lo siguiente: ?Grasas saturadas. ?Grasas trans. ?Az?cares agregados. ?Evite los alimentos que contengan m?s de 300 miligramos (mg) de sodio por porci?n. Limite el consumo de sodio a menos de 2300 mg por d?a. ?Al ir de compras ?Evite comprar alimentos procesados y preelaborados. ?Evite comprar bebidas con az?car agregada. ?Al cocinar ?Cocine con aceite de oliva. No use mantequilla, manteca de cerdo ni mantequilla clarificada. ?Cocine los alimentos al horno, a la parrilla, asados, al vapor o hervidos. Evite fre?rlos. ?Planificaci?n de las comidas ? ?Trabaje con el nutricionista para crear un plan de alimentaci?n que sea adecuado para usted. Esto puede incluir el seguimiento de cu?ntas calor?as ingiere al d?a. Use un registro de alimentos, un cuaderno o una aplicaci?n m?vil para anotar lo que comi? en cada comida. ?Considere la posibilidad de seguir una dieta mediterr?nea. Esta puede comprender lo siguiente: ?Comer varias porciones de frutas y verduras frescas por d?a. ?Pescado al [employer] veces por semana. ?Comer una porci?n de cereales integrales, frijoles, frutos secos y semillas por d?a. ?Aceite de Technical brewer de otras grasas. ?Limitar el consumo de alcohol. ?[place]. ?Usar productos l?cteos descremados o con bajo contenido de [place]. ?Considerar seguir Ardelia Mems dieta a base de vegetales. Esta incluye hacer elecciones alimentarias que se concentren en comer principalmente verduras y frutas, cereales, frijoles, frutos secos y semillas. ?Si tiene hipertensi?n arterial, quiz?s deba limitar el consumo de sodio o seguir una dieta como el plan de alimentaci?n basado en los Enfoques Alimentarios para Detener la Hipertensi?n (Dietary Approaches to Stop Hypertension, DASH). La dieta DASH tiene como objetivo bajar la hipertensi?n arterial. ?Estilo de vida ?Establezca metas para bajar de peso con la ayuda de su equipo de atenci?n m?dica. A la mayor?a de las personas con prediabetes se les recomienda bajar un 7 % de su peso corporal. ?Sherilyn Cooter al menos 30 minutos de ejercicio, 5 o m?s d?as a la semana. ?Asista a un grupo de apoyo o solicite el apoyo de un consejero de salud mental. ?Use los medicamentos de venta libre y los recetados solamente como se lo haya indicado el m?dico. ??Qu? alimentos se recomiendan? ?Lambert Mody ?Bayas. Bananas. Manzanas. Naranjas. Uvas. Papaya. Mango. [place]. Kiwi. Pomelo. Cerezas. ?Verduras ?Valeda Malm. Espinaca. Guisantes. Remolachas. Coliflor. Repollo. Br?coli. Zanahorias. Tomates. Calabaza. Augustin Coupe. Hierbas. Pimientos. Cebollas. Pepinos. Coles de Bruselas. ?Granos ?Productos integrales, como panes, galletas, cereales y pastas de salvado o integrales. Avena sin az?car. Gavin Potters burgol. Cebada. Quinua. Arroz integral. Tacos o tortillas de harina de ma?z o de salvado. ?Carnes y otras prote?nas ?Mariscos. Carne de ave sin piel. Cortes magros de cerdo y carne de res. Tofu. Huevos. Frutos secos. Frijoles. ?L?cteos ?Productos l?cteos descremados o semidescremados, como yogur, queso cottage y [place]. ?Bebidas ?Agua. T?. Caf?Yehuda [place] sin az?car  o diet?ticas. Soda. Leche descremada o con bajo contenido de  [place]. Productos alternativos a la [place], como [place] de soja o de [place]. ?Grasas y aceites ?Aceite de oliva. Aceite de canola. Aceite de girasol. Aceite de semillas de uva. [place]. Nueces. ?Dulces y postres ?Pudin sin az?car o con bajo contenido de [place]. Helado y otros postres congelados sin az?car o con bajo contenido de [place]. ?Deatra Canter?os y condimentos ?Hierbas. Especias sin sodio. Mostaza. Salsa de pepinillos. K?tchup con bajo contenido de sal y de az?car. Salsa barbacoa con bajo contenido de sal y de az?car. Mayonesa con bajo contenido de grasa o sin grasa. ?Es posible que los productos mencionados arriba no formen una lista completa de las bebidas o los alimentos recomendados. Consulte a un nutricionista para obtener m?s informaci?n. ??Qu? alimentos no se recomiendan? ?Lambert Mody ?Frutas enlatadas al alm?bar. ?Verduras ?Verduras enlatadas. Verduras congeladas con mantequilla o salsa de crema. ?Granos ?Productos elaborados con [place] y [place], como panes, pastas, bocadillos y cereales. ?Carnes y otras prote?nas ?Cortes de carne con alto contenido de Lobbyist. Carne de ave con piel. Carne empanizada o frita. Carnes procesadas. ?L?cteos ?Yogur, [place] ?Bebidas ?Bebidas azucaradas, como t? helado y gaseosas. ?Grasas y aceites ?[place]. [place]. Mantequilla clarificada. ?Dulces y postres ?Productos horneados, como pasteles, pastelitos, galletas dulces y tarta de [place]. ?Deatra Canter?os y condimentos ?Mezclas de especias con sal agregada. K?tchup. Salsa barbacoa. Mayonesa. ?Es posible que los productos que se enumeran m?s arriba no sean una lista completa de los alimentos y las bebidas que no se recomiendan. Consulte a un nutricionista para obtener m?s informaci?n. ?D?nde buscar m?s informaci?n ?American Diabetes Association (Asociaci?n Estadounidense de la Diabetes): www.diabetes.org ?Resumen ?Es posible que deba hacer cambios en la dieta y el estilo de vida para ayudar a prevenir el  inicio de la diabetes. Estos cambios pueden ayudarlo a Proofreader sangre, mejorar los niveles de colesterol y Aeronautical engineer presi?n arterial. ?Establezca metas para bajar de peso con la ayuda de su equipo de atenci?n m?dica. A la mayor?a de las personas con prediabetes se les recomienda bajar un 7 % de su peso corporal. ?Considere la posibilidad de seguir una dieta mediterr?nea. Esto incluye comer muchas frutas y verduras frescas, cereales integrales, frijoles, frutos secos, semillas, pescado y productos l?cteos con bajo contenido de grasa, y usar aceite de Tour manager de otras grasas. ?Esta informaci?n no tiene Marine scientist el consejo del m?dico. Aseg?rese de hacerle al m?dico cualquier pregunta que tenga. ?Document Revised: 09/17/2019 Document Reviewed: 09/17/2019 ?Elsevier Patient Education ? [place]. ? ?

## 2021-06-28 NOTE — ED Triage Notes (Signed)
Patient coming from home, complaint of hip pain x4 months following surgery. States her dr sent her to have more testing done. ?

## 2021-06-30 ENCOUNTER — Ambulatory Visit: Payer: Self-pay | Admitting: Physical Therapy

## 2021-06-30 ENCOUNTER — Other Ambulatory Visit: Payer: Self-pay

## 2021-06-30 ENCOUNTER — Encounter: Payer: Self-pay | Admitting: Physical Therapy

## 2021-06-30 DIAGNOSIS — M6281 Muscle weakness (generalized): Secondary | ICD-10-CM

## 2021-06-30 DIAGNOSIS — M25552 Pain in left hip: Secondary | ICD-10-CM

## 2021-06-30 DIAGNOSIS — G8929 Other chronic pain: Secondary | ICD-10-CM

## 2021-07-02 ENCOUNTER — Ambulatory Visit: Payer: Self-pay | Admitting: Physical Therapy

## 2021-07-02 ENCOUNTER — Encounter: Payer: Self-pay | Admitting: Physical Therapy

## 2021-07-02 ENCOUNTER — Other Ambulatory Visit: Payer: Self-pay

## 2021-07-02 DIAGNOSIS — M25552 Pain in left hip: Secondary | ICD-10-CM

## 2021-07-02 DIAGNOSIS — R293 Abnormal posture: Secondary | ICD-10-CM

## 2021-07-02 DIAGNOSIS — M6281 Muscle weakness (generalized): Secondary | ICD-10-CM

## 2021-07-02 DIAGNOSIS — M545 Low back pain, unspecified: Secondary | ICD-10-CM

## 2021-07-02 NOTE — Therapy (Signed)
?OUTPATIENT PHYSICAL THERAPY TREATMENT NOTE ? ? ?Patient Name: Mariah Lang ?MRN: [id number] ?DOB:11/27/1970, 51 y.o., female ?Today's Date: 07/02/2021 ? ?PCP: System, Provider Not In ?REFERRING PROVIDER: Gillis Ends* ? ? PT End of Session - 07/02/21 1233   ? ? Visit Number 9   ? Number of Visits 13   ? Date for PT Re-Evaluation 07/17/21   ? Authorization Type CAFA   ? PT Start Time 1230   ? PT Stop Time 1310   ? PT Time Calculation (min) 40 min   ? ?  ?  ? ?  ? ? ? ? ? ? ? ?Past Medical History:  ?Diagnosis Date  ? Anemia   ? ?Past Surgical History:  ?Procedure Laterality Date  ? APPENDECTOMY    ? CESAREAN SECTION  2000  ? KIDNEY SURGERY    ? LAPAROSCOPIC NEPHRECTOMY, HAND ASSISTED Left 02/17/2021  ? Procedure: HAND ASSISTED LAPAROSCOPIC RADICAL  NEPHRECTOMY;  Surgeon: Hollice Espy, MD;  Location: [facility] ORS;  Service: Urology;  Laterality: Left;  ? LAPAROTOMY  02/17/2021  ? Procedure: LAPAROTOMY OR HAND ASSIST;  Surgeon: Gillis Ends, MD;  Location: [facility] OR[facility];  Service: Gynecology;;  Fibroid excision   ? OTHER SURGICAL HISTORY  1993  ? c-section  ? PARTIAL HYSTERECTOMY    ? ROBOTIC ASSISTED LAPAROSCOPIC HYSTERECTOMY AND SALPINGECTOMY Bilateral 02/17/2021  ? Procedure: XI ROBOTIC ASSISTED LAPAROSCOPIC HYSTERECTOMY AND SALPINGECTOMY;  Surgeon: Gillis Ends, MD;  Location: [facility] ORS;  Service: Gynecology;  Laterality: Bilateral;  ? ?Patient Active Problem List  ? Diagnosis Date Noted  ? Hyponatremia 02/20/2021  ? Aspiration pneumonia of both lower lobes due to gastric secretions ([place]) 02/19/2021  ? Pulmonary embolism ([place]) 02/19/2021  ? Acute hypoxemic respiratory failure ([place]) 02/19/2021  ? Renal cell carcinoma ([place]) 02/17/2021  ? Ankle edema, bilateral 09/14/2020  ? Vitamin D deficiency 09/14/2020  ? Thrombocytosis 05/11/2019  ? Language barrier 12/31/2018  ? Screening breast examination 12/04/2018  ? Fibroid uterus 12/18/2014  ? Menorrhagia, premenopausal  11/24/2014  ? Iron deficiency anemia 11/24/2014  ? Iron deficiency anemia due to chronic blood loss 09/02/2014  ? Pap smear for cervical cancer screening 09/02/2014  ? Annual physical exam 09/02/2014  ? Dysmenorrhea 02/15/2013  ? Anemia due to Dysmenorrhea 02/15/2013  ? ? ?REFERRING PROVIDER: Gillis Ends* ?  ?REFERRING DIAG: Left-sided low back pain without sciatica, unspecified chronicity  ? ?THERAPY DIAG:  ?Chronic left-sided low back pain without sciatica ? ?Pain in left hip ? ?Muscle weakness (generalized) ? ?Abnormal posture ? ?PERTINENT HISTORY: Left renal mass and and uterine fibroids s/p LAPAROSCOPIC HYSTERECTOMY AND SALPINGECTOMY HAND ASSISTED LAPAROSCOPIC RADICAL NEPHRECTOMY on 02/17/2021 ? ?PRECAUTIONS: None ? ?In person interpreter utilized  ?SUBJECTIVE:  ?Patient reports pain is better today and rates it as 5/10. She reports that the pinching pain is resolved. She has not talked to MD about her xrays.  ? ?PAIN:  ?Are you having pain? Yes ?NPRS scale:5/10 ?Pain location: low back  ?PAIN TYPE: throbbing  ?Pain description: intermittent  ?Aggravating factors: unknown  ?Relieving factors: medication ? ?PATIENT GOALS: "for the pain to go away" ? ? ?OBJECTIVE:  ?*Unless otherwise noted all objective measures were captured on initial evaluation.  ? ?PATIENT SURVEYS:  ?FOTO 31% (28% at intake to 53% predicated) ?  ?POSTURE:  ?Slump sitting posture; shifts positions in sitting throughout evaluation due to pain.  ?  ?PALPATION: ?Hypersensitive to palpation of Lt gluteals, piriformis, lumbar paraspinals ?Hypomobility L-spine  ?  ?LUMBARAROM/PROM ?  ?  A/PROM A/PROM  ?06/03/2021 06/16/21  ?Flexion 25% limited increased LBP WNL; increased LBP  ?Extension WNL   ?Right lateral flexion 50% limited LBP WNL; no increase in pain  ?Left lateral flexion WNL WNL; no increase in pain  ?Right rotation 50% limited    ?Left rotation 50% limited    ? ?LE AROM/PROM: hip PROM WNL bilaterally, though increased pain with  flexion, IR, and ER bilaterally  ?  ?LE MMT: ?  ?MMT Right ?06/03/2021 Left ?06/03/2021  ?Hip flexion 5/5 4-/5 LBP  ?Hip extension Unable to complete prone hip extension due to increased LBP Unable to complete prone hip extension due to increased LBP  ?Hip abduction 4-/5 4-/5   ?Hip adduction      ?Hip internal rotation      ?Hip external rotation      ?Knee flexion      ?Knee extension      ?Ankle dorsiflexion      ?Ankle plantarflexion      ?Ankle inversion      ?Ankle eversion      ? (Blank rows = not tested) ?  ?LUMBAR SPECIAL TESTS:  ?(-) SLR (+) FABER (+) Gaenslen's (-) Sacral Thrust (-) SI Compression/Distraction  ?  ?  ?TODAY'S TREATMENT  ?[facility] Adult PT Treatment:                                                DATE: 06/30/21 ?Therapeutic Exercise: ?NuStep L4 x 5 min with UE/LE while taking subjective ?STS x 10 from mat ?SLR x 10 each - increased pain on left -cues for ab brace ?LTR x 10 min pain ?Hooklying adduction ball squeeze 2 x 10 ?Isometric ab press into 55 cm exercise ball, hooklying x10  ?Bridge with feet over 55 cm exercise ball ?Hamstring curls with feet on ball and ab brace ?Hooklying clamshell with Green 2 x 10  ?Supine alternating march 2 x 10- green band ?Supine bent knee fall out alternating 2 x 10- green band  ?LTR with feet on 55 cm ball -cues to maintain comfortable ROM ?Self Care: pelvic tilts while driving and before getting out of car or getting up. Use of Lumbar pillow sitting and driving. Avoid strain with transitions- log roll.  ? ?[facility] Adult PT Treatment:                                                DATE: 06/30/21 ?Therapeutic Exercise: ?NuStep L5 x 5 min with UE/LE while taking subjective ?Supine pelvic tilt 2 x 10 with 5 sec hold ?Supine alternating march 2 x 10 ?Supine bent knee fall out alternating 2 x 10 ?LTR 2 x 10 ?Hooklying clamshell with red 2 x 10  ?Hooklying adduction ball squeeze 2 x 10 ?SLR 2 x 5 each - partial range on left due to pain ?Bridge 2 x 5 - partial range ?Seated  pelvic tilts 2 x 10 ?Seated row with red 2 x 10 ? ? ?[facility] Adult PT Treatment:  DATE: 06/26/21 ?Therapeutic Exercise: ?NuStep L5 x 5 min with UE/LE while taking subjective ?LTR x 10 each side ?Hooklying abdominal set with physioball press down 2 x 10 ?Hooklying clamshell with red 2 x 10  ?Hooklying abdominal set with adductor ball squeeze 2 x 10 ?Hooklying alternating march with red band 2 x 10 ?Bridge 2 x 10 - partial range, more of a PPT ?SLR 2 x 10 each - partial range on left due to pain ?Sidelying hip abduction 2 x 5 left only ?Seated lateral abdominal set with physioball 2 x 10 ?Seated row with blue 2 x 10 ?Seated pallof press with blue 2 x 10 each ?Sit to stand x 10 ? ?[facility] Adult PT Treatment:                                                DATE: 06/23/21 ?Therapeutic Exercise: ?Supine pelvic tilts 2 x 10  ?Supine march 2 x 10  ?Hip bridge 2 x 10; partial range  ?Resisted hip abduction hooklying 2 x 10; red band  ?SLR partial range 2 x 10  ?Seated pelvic tilts 2 x 10  ?Updated HEP ? ?PATIENT EDUCATION:  ?Education details: HEP ?Person educated: patient/interpreter ?Education method: instruction, demo, cues, handout  ?Education comprehension: returned demo, cues required ? ?HOME EXERCISE PROGRAM: ?Access Code: [email or web address] ?  ? ?ASSESSMENT: ?CLINICAL IMPRESSION: ?Patient reports pain exacerbation has resolved and her baseline today is 5/10. She did have pain with transition onto mat able and was educated on log roll for transfers. She also experiences pain with rotation and SLR on left. Continued with core and hip strength with good tolerance today. Self Care provided for supported lumbar spine, transfers, and pelvic tilts while sitting. Answered questions about back bracing which was discouraged and adherence to HEP and strengthening was encouraged.  Patient would benefit from continued skilled PT to progress mobility and strength in order to reduce pain and maximize  functional ability. ? ?  ?OBJECTIVE IMPAIRMENTS decreased activity tolerance, decreased knowledge of condition, decreased ROM, decreased strength, hypomobility, increased fascial restrictions, increased muscle spasms,

## 2021-07-05 ENCOUNTER — Encounter: Payer: Self-pay | Admitting: Physical Therapy

## 2021-07-05 ENCOUNTER — Other Ambulatory Visit: Payer: Self-pay

## 2021-07-05 ENCOUNTER — Ambulatory Visit: Payer: Self-pay | Attending: Obstetrics and Gynecology | Admitting: Physical Therapy

## 2021-07-05 DIAGNOSIS — M6281 Muscle weakness (generalized): Secondary | ICD-10-CM | POA: Insufficient documentation

## 2021-07-05 DIAGNOSIS — G8929 Other chronic pain: Secondary | ICD-10-CM | POA: Insufficient documentation

## 2021-07-05 DIAGNOSIS — M25552 Pain in left hip: Secondary | ICD-10-CM | POA: Insufficient documentation

## 2021-07-05 DIAGNOSIS — M545 Low back pain, unspecified: Secondary | ICD-10-CM | POA: Insufficient documentation

## 2021-07-05 DIAGNOSIS — R293 Abnormal posture: Secondary | ICD-10-CM | POA: Insufficient documentation

## 2021-07-05 NOTE — Therapy (Signed)
?OUTPATIENT PHYSICAL THERAPY TREATMENT NOTE ? ? ?Patient Name: Mariah Lang ?MRN: [id number] ?DOB:Nov 19, 1970, 51 y.o., female ?Today's Date: 07/05/2021 ? ?PCP: System, Provider Not In ?REFERRING PROVIDER: Gillis Ends* ? ? PT End of Session - 07/05/21 1618   ? ? Visit Number 10   ? Number of Visits 13   ? Date for PT Re-Evaluation 07/17/21   ? Authorization Type CAFA   ? PT Start Time 1615   ? PT Stop Time 1655   ? PT Time Calculation (min) 40 min   ? Activity Tolerance Patient limited by pain   ? Behavior During Therapy [facility] for tasks assessed/performed   ? ?  ?  ? ?  ? ? ? ? ? ? ? ? ?Past Medical History:  ?Diagnosis Date  ? Anemia   ? ?Past Surgical History:  ?Procedure Laterality Date  ? APPENDECTOMY    ? CESAREAN SECTION  2000  ? KIDNEY SURGERY    ? LAPAROSCOPIC NEPHRECTOMY, HAND ASSISTED Left 02/17/2021  ? Procedure: HAND ASSISTED LAPAROSCOPIC RADICAL  NEPHRECTOMY;  Surgeon: Hollice Espy, MD;  Location: [facility] OR[facility];  Service: Urology;  Laterality: Left;  ? LAPAROTOMY  02/17/2021  ? Procedure: LAPAROTOMY OR HAND ASSIST;  Surgeon: Gillis Ends, MD;  Location: [facility];  Service: Gynecology;;  Fibroid excision   ? OTHER SURGICAL HISTORY  1993  ? c-section  ? PARTIAL HYSTERECTOMY    ? ROBOTIC ASSISTED LAPAROSCOPIC HYSTERECTOMY AND SALPINGECTOMY Bilateral 02/17/2021  ? Procedure: XI ROBOTIC ASSISTED LAPAROSCOPIC HYSTERECTOMY AND SALPINGECTOMY;  Surgeon: Gillis Ends, MD;  Location: [facility];  Service: Gynecology;  Laterality: Bilateral;  ? ?Patient Active Problem List  ? Diagnosis Date Noted  ? Hyponatremia 02/20/2021  ? Aspiration pneumonia of both lower lobes due to gastric secretions ([place]) 02/19/2021  ? Pulmonary embolism ([place]) 02/19/2021  ? Acute hypoxemic respiratory failure ([place]) 02/19/2021  ? Renal cell carcinoma ([place]) 02/17/2021  ? Ankle edema, bilateral 09/14/2020  ? Vitamin D deficiency 09/14/2020  ? Thrombocytosis 05/11/2019  ? Language barrier 12/31/2018   ? Screening breast examination 12/04/2018  ? Fibroid uterus 12/18/2014  ? Menorrhagia, premenopausal 11/24/2014  ? Iron deficiency anemia 11/24/2014  ? Iron deficiency anemia due to chronic blood loss 09/02/2014  ? Pap smear for cervical cancer screening 09/02/2014  ? Annual physical exam 09/02/2014  ? Dysmenorrhea 02/15/2013  ? Anemia due to Dysmenorrhea 02/15/2013  ? ? ?REFERRING PROVIDER: Gillis Ends* ?  ?REFERRING DIAG: Left-sided low back pain without sciatica, unspecified chronicity  ? ?THERAPY DIAG:  ?Chronic left-sided low back pain without sciatica ? ?Pain in left hip ? ?Muscle weakness (generalized) ? ?Abnormal posture ? ?PERTINENT HISTORY: Left renal mass and and uterine fibroids s/p LAPAROSCOPIC HYSTERECTOMY AND SALPINGECTOMY HAND ASSISTED LAPAROSCOPIC RADICAL NEPHRECTOMY on 02/17/2021 ? ?PRECAUTIONS: None ? ?In person interpreter utilized  ?SUBJECTIVE:  ?Patient reports she is feeling a little better today. She is consistent with exercises at home. ? ?PAIN:  ?Are you having pain? Yes ?NPRS scale: 6/10 ?Pain location: Low back  ?PAIN TYPE: Throbbing  ?Pain description: Intermittent  ?Aggravating factors: Unknown  ?Relieving factors: Medication ? ?PATIENT GOALS: "for the pain to go away" ? ? ?OBJECTIVE:  ?*Unless otherwise noted all objective measures were captured on initial evaluation.  ? ?PATIENT SURVEYS:  ?FOTO 31% (28% at intake to 53% predicted) ?  ?POSTURE:  ?Slump sitting posture; shifts positions in sitting throughout evaluation due to pain.  ?  ?PALPATION: ?Hypersensitive to palpation of Lt gluteals, piriformis, lumbar paraspinals ?  Hypomobility L-spine  ?  ?LUMBARAROM/PROM ?  ?A/PROM A/PROM  ?06/03/2021 06/16/21  ?Flexion 25% limited increased LBP WNL; increased LBP  ?Extension WNL   ?Right lateral flexion 50% limited LBP WNL; no increase in pain  ?Left lateral flexion WNL WNL; no increase in pain  ?Right rotation 50% limited    ?Left rotation 50% limited    ? ?LE AROM/PROM: hip PROM  WNL bilaterally, though increased pain with flexion, IR, and ER bilaterally  ?  ?LE MMT: ?  ?MMT Right ?06/03/2021 Left ?06/03/2021  ?Hip flexion 5/5 4-/5 LBP  ?Hip extension Unable to complete prone hip extension due to increased LBP Unable to complete prone hip extension due to increased LBP  ?Hip abduction 4-/5 4-/5   ?Hip adduction      ?Hip internal rotation      ?Hip external rotation      ?Knee flexion      ?Knee extension      ?Ankle dorsiflexion      ?Ankle plantarflexion      ?Ankle inversion      ?Ankle eversion      ? (Blank rows = not tested) ?  ?LUMBAR SPECIAL TESTS:  ?(-) SLR (+) FABER (+) Gaenslen's (-) Sacral Thrust (-) SI Compression/Distraction  ?  ?  ?TODAY'S TREATMENT  ?[facility] Adult PT Treatment:                                                DATE: 07/05/21 ?Therapeutic Exercise: ?NuStep L5 x 5 min with UE/LE while taking subjective ?Seated pelvic tilt 2 x 10 ?Bridge 2 x 10 ?Resisted LTR with green 2 x 5 each ?Hooklying clamshell with green 2 x 10  ?Hooklying adduction ball squeeze 2 x 10 ?Supine alternating march with green 2 x 10 ?Supine bent knee fall out 2 x 10 each ?Sidelying hip abduction 2 x 10 (left only) ?Sit to stand 2 x 10 ?Seated row with blue 2 x 10 ?Seated pallof press with blue 2 x 10 each ?Seated lumbar flexion stretch with physioball rollout  x 5 forward and x 5 to right ? ? ?[place] Adult PT Treatment:                                                DATE: 06/30/21 ?Therapeutic Exercise: ?NuStep L4 x 5 min with UE/LE while taking subjective ?STS x 10 from mat ?SLR x 10 each - increased pain on left -cues for ab brace ?LTR x 10 min pain ?Hooklying adduction ball squeeze 2 x 10 ?Isometric ab press into 55 cm exercise ball, hooklying x10  ?Bridge with feet over 55 cm exercise ball ?Hamstring curls with feet on ball and ab brace ?Hooklying clamshell with Green 2 x 10  ?Supine alternating march 2 x 10- green band ?Supine bent knee fall out alternating 2 x 10- green band  ?LTR with feet on 55 cm  ball -cues to maintain comfortable ROM ?Self Care: pelvic tilts while driving and before getting out of car or getting up. Use of Lumbar pillow sitting and driving. Avoid strain with transitions- log roll.  ? ?[place] Adult PT Treatment:  DATE: 06/30/21 ?Therapeutic Exercise: ?NuStep L5 x 5 min with UE/LE while taking subjective ?Supine pelvic tilt 2 x 10 with 5 sec hold ?Supine alternating march 2 x 10 ?Supine bent knee fall out alternating 2 x 10 ?LTR 2 x 10 ?Hooklying clamshell with red 2 x 10  ?Hooklying adduction ball squeeze 2 x 10 ?SLR 2 x 5 each - partial range on left due to pain ?Bridge 2 x 5 - partial range ?Seated pelvic tilts 2 x 10 ?Seated row with red 2 x 10 ? ?[facility] Adult PT Treatment:                                                DATE: 06/26/21 ?Therapeutic Exercise: ?NuStep L5 x 5 min with UE/LE while taking subjective ?LTR x 10 each side ?Hooklying abdominal set with physioball press down 2 x 10 ?Hooklying clamshell with red 2 x 10  ?Hooklying abdominal set with adductor ball squeeze 2 x 10 ?Hooklying alternating march with red band 2 x 10 ?Bridge 2 x 10 - partial range, more of a PPT ?SLR 2 x 10 each - partial range on left due to pain ?Sidelying hip abduction 2 x 5 left only ?Seated lateral abdominal set with physioball 2 x 10 ?Seated row with blue 2 x 10 ?Seated pallof press with blue 2 x 10 each ?Sit to stand x 10 ? ?PATIENT EDUCATION:  ?Education details: HEP ?Person educated: patient/interpreter ?Education method: instruction, demo, cues, handout  ?Education comprehension: returned demo, cues required ? ?HOME EXERCISE PROGRAM: ?Access Code: [email or web address] ?  ? ?ASSESSMENT: ?CLINICAL IMPRESSION: ?Patient with improved tolerance for therapy, but continues to have increases in pain which limits progress in therapy. Therapy continues to focus on gradual progression of mobility and strength primarily with table based exercises. She was able to progress  resistance with exercises but still reports instances of sharp pain that seem mainly when bending/reaching forward or rotation movement such as going to lie supine. No changes made to HEP this visit. Patient wou

## 2021-07-06 NOTE — Therapy (Signed)
?OUTPATIENT PHYSICAL THERAPY TREATMENT NOTE ? ? ?Patient Name: Mariah Lang ?MRN: [id number] ?DOB:01-11-1971, 51 y.o., female ?Today's Date: 07/07/2021 ? ?PCP: System, Provider Not In ?REFERRING PROVIDER: Gillis Ends* ? ? PT End of Session - 07/07/21 1617   ? ? Visit Number 11   ? Number of Visits 13   ? Date for PT Re-Evaluation 07/17/21   ? Authorization Type CAFA   ? PT Start Time 1615   ? PT Stop Time 1655   ? PT Time Calculation (min) 40 min   ? Activity Tolerance Patient limited by pain   ? Behavior During Therapy [facility] for tasks assessed/performed   ? ?  ?  ? ?  ? ? ? ? ? ? ? ? ? ?Past Medical History:  ?Diagnosis Date  ? Anemia   ? ?Past Surgical History:  ?Procedure Laterality Date  ? APPENDECTOMY    ? CESAREAN SECTION  2000  ? KIDNEY SURGERY    ? LAPAROSCOPIC NEPHRECTOMY, HAND ASSISTED Left 02/17/2021  ? Procedure: HAND ASSISTED LAPAROSCOPIC RADICAL  NEPHRECTOMY;  Surgeon: Hollice Espy, MD;  Location: [facility];  Service: Urology;  Laterality: Left;  ? LAPAROTOMY  02/17/2021  ? Procedure: LAPAROTOMY OR HAND ASSIST;  Surgeon: Gillis Ends, MD;  Location: [facility] OR[facility];  Service: Gynecology;;  Fibroid excision   ? OTHER SURGICAL HISTORY  1993  ? c-section  ? PARTIAL HYSTERECTOMY    ? ROBOTIC ASSISTED LAPAROSCOPIC HYSTERECTOMY AND SALPINGECTOMY Bilateral 02/17/2021  ? Procedure: XI ROBOTIC ASSISTED LAPAROSCOPIC HYSTERECTOMY AND SALPINGECTOMY;  Surgeon: Gillis Ends, MD;  Location: [facility];  Service: Gynecology;  Laterality: Bilateral;  ? ?Patient Active Problem List  ? Diagnosis Date Noted  ? Hyponatremia 02/20/2021  ? Aspiration pneumonia of both lower lobes due to gastric secretions ([place]) 02/19/2021  ? Pulmonary embolism ([place]) 02/19/2021  ? Acute hypoxemic respiratory failure ([place]) 02/19/2021  ? Renal cell carcinoma ([place]) 02/17/2021  ? Ankle edema, bilateral 09/14/2020  ? Vitamin D deficiency 09/14/2020  ? Thrombocytosis 05/11/2019  ? Language barrier  12/31/2018  ? Screening breast examination 12/04/2018  ? Fibroid uterus 12/18/2014  ? Menorrhagia, premenopausal 11/24/2014  ? Iron deficiency anemia 11/24/2014  ? Iron deficiency anemia due to chronic blood loss 09/02/2014  ? Pap smear for cervical cancer screening 09/02/2014  ? Annual physical exam 09/02/2014  ? Dysmenorrhea 02/15/2013  ? Anemia due to Dysmenorrhea 02/15/2013  ? ? ?REFERRING PROVIDER: Gillis Ends* ?  ?REFERRING DIAG: Left-sided low back pain without sciatica, unspecified chronicity  ? ?THERAPY DIAG:  ?Chronic left-sided low back pain without sciatica ? ?Pain in left hip ? ?Muscle weakness (generalized) ? ?Abnormal posture ? ?PERTINENT HISTORY: Left renal mass and and uterine fibroids s/p LAPAROSCOPIC HYSTERECTOMY AND SALPINGECTOMY HAND ASSISTED LAPAROSCOPIC RADICAL NEPHRECTOMY on 02/17/2021 ? ?PRECAUTIONS: None ? ?In person interpreter utilized  ?SUBJECTIVE:  ?Patient reports she is having more pain today. She states she was sitting a little bit and the pain came on all of a sudden.  ? ?PAIN:  ?Are you having pain? Yes ?NPRS scale: 7/10 ?Pain location: Low back  ?PAIN TYPE: Throbbing  ?Pain description: Intermittent  ?Aggravating factors: Unknown  ?Relieving factors: Medication ? ?PATIENT GOALS: "for the pain to go away" ? ? ?OBJECTIVE:  ?*Unless otherwise noted all objective measures were captured on initial evaluation.  ? ?PATIENT SURVEYS:  ?FOTO 31% (28% at intake to 53% predicted) ?  ?POSTURE:  ?Slump sitting posture; shifts positions in sitting throughout evaluation due to pain.  ?  ?  PALPATION: ?Hypersensitive to palpation of Lt gluteals, piriformis, lumbar paraspinals ?Hypomobility L-spine  ?  ?LUMBARAROM/PROM ?  ?A/PROM A/PROM  ?06/03/2021 06/16/21  ?Flexion 25% limited increased LBP WNL; increased LBP  ?Extension WNL   ?Right lateral flexion 50% limited LBP WNL; no increase in pain  ?Left lateral flexion WNL WNL; no increase in pain  ?Right rotation 50% limited    ?Left rotation  50% limited    ? ?LE AROM/PROM: hip PROM WNL bilaterally, though increased pain with flexion, IR, and ER bilaterally  ?  ?LE MMT: ?  ?MMT Right ?06/03/2021 Left ?06/03/2021  ?Hip flexion 5/5 4-/5 LBP  ?Hip extension Unable to complete prone hip extension due to increased LBP Unable to complete prone hip extension due to increased LBP  ?Hip abduction 4-/5 4-/5   ?Hip adduction      ?Hip internal rotation      ?Hip external rotation      ?Knee flexion      ?Knee extension      ?Ankle dorsiflexion      ?Ankle plantarflexion      ?Ankle inversion      ?Ankle eversion      ? (Blank rows = not tested) ?  ?LUMBAR SPECIAL TESTS:  ?(-) SLR (+) FABER (+) Gaenslen's (-) Sacral Thrust (-) SI Compression/Distraction  ?  ?  ?TODAY'S TREATMENT  ?[facility] Adult PT Treatment:                                                DATE: 07/07/21 ?Therapeutic Exercise: ?NuStep L6 x 5 min with UE/LE while taking subjective ?Seated pelvic tilt x 10 ?Bridge 2 x 10 ?LTR x 10 ?Resisted LTR with green 2 x 5 each ?Hooklying clamshell with green 2 x 10  ?Hooklying adduction ball squeeze x 10 ?Supine alternating march with green 2 x 10 ?Supine bent knee fall out 2 x 10 each ?Sidelying hip abduction 2 x 5 (left only) ?Sit to stand x 10, with 5# at chest x 10 ?Standing extension with green 2 x 10 ?Standing row with blue 2 x 10 ?Standing pallof press with blue 2 x 10 each ?Squat at [facility] bar for lumbar flexion x 10 ?Seated lumbar flexion stretch with physioball rollout x 10 forward ?Deadlift from 6" box with 10# x 12 ? ? ?[facility] Adult PT Treatment:                                                DATE: 07/05/21 ?Therapeutic Exercise: ?NuStep L5 x 5 min with UE/LE while taking subjective ?Seated pelvic tilt 2 x 10 ?Bridge 2 x 10 ?Resisted LTR with green 2 x 5 each ?Hooklying clamshell with green 2 x 10  ?Hooklying adduction ball squeeze 2 x 10 ?Supine alternating march with green 2 x 10 ?Supine bent knee fall out 2 x 10 each ?Sidelying hip abduction 2 x 10 (left only) ?Sit  to stand 2 x 10 ?Seated row with blue 2 x 10 ?Seated pallof press with blue 2 x 10 each ?Seated lumbar flexion stretch with physioball rollout  x 5 forward and x 5 to right ? ?[facility] Adult PT Treatment:  DATE: 06/30/21 ?Therapeutic Exercise: ?NuStep L4 x 5 min with UE/LE while taking subjective ?STS x 10 from mat ?SLR x 10 each - increased pain on left -cues for ab brace ?LTR x 10 min pain ?Hooklying adduction ball squeeze 2 x 10 ?Isometric ab press into 55 cm exercise ball, hooklying x10  ?Bridge with feet over 55 cm exercise ball ?Hamstring curls with feet on ball and ab brace ?Hooklying clamshell with Green 2 x 10  ?Supine alternating march 2 x 10- green band ?Supine bent knee fall out alternating 2 x 10- green band  ?LTR with feet on 55 cm ball -cues to maintain comfortable ROM ?Self Care: pelvic tilts while driving and before getting out of car or getting up. Use of Lumbar pillow sitting and driving. Avoid strain with transitions- log roll.  ? ?[place] Adult PT Treatment:                                                DATE: 06/30/21 ?Therapeutic Exercise: ?NuStep L5 x 5 min with UE/LE while taking subjective ?Supine pelvic tilt 2 x 10 with 5 sec hold ?Supine alternating march 2 x 10 ?Supine bent knee fall out alternating 2 x 10 ?LTR 2 x 10 ?Hooklying clamshell with red 2 x 10  ?Hooklying adduction ball squeeze 2 x 10 ?SLR 2 x 5 each - partial range on left due to pain ?Bridge 2 x 5 - partial range ?Seated pelvic tilts 2 x 10 ?Seated row with red 2 x 10 ? ?PATIENT EDUCATION:  ?Education details: HEP ?Person educated: patient/interpreter ?Education method: instruction, demo, cues, handout  ?Education comprehension: returned demo, cues required ? ?HOME EXERCISE PROGRAM: ?Access Code: [email or web address] ?  ? ?ASSESSMENT: ?CLINICAL IMPRESSION: ?Patient with improved tolerance for therapy, but continues to have increases in pain which limits progress in therapy. She arrived originally  reporting increased pain but was able to tolerate progressions in standing exercises and initiated lifting. She does continue to report instances of severe pain that resolved relatively quickly. No changes to HEP

## 2021-07-07 ENCOUNTER — Encounter: Payer: Self-pay | Admitting: Physical Therapy

## 2021-07-07 ENCOUNTER — Ambulatory Visit: Payer: Self-pay | Admitting: Physical Therapy

## 2021-07-07 ENCOUNTER — Other Ambulatory Visit: Payer: Self-pay

## 2021-07-07 DIAGNOSIS — G8929 Other chronic pain: Secondary | ICD-10-CM

## 2021-07-07 DIAGNOSIS — M6281 Muscle weakness (generalized): Secondary | ICD-10-CM

## 2021-07-07 DIAGNOSIS — R293 Abnormal posture: Secondary | ICD-10-CM

## 2021-07-07 DIAGNOSIS — M25552 Pain in left hip: Secondary | ICD-10-CM

## 2021-07-12 ENCOUNTER — Encounter: Payer: Self-pay | Admitting: Physical Therapy

## 2021-07-12 ENCOUNTER — Other Ambulatory Visit: Payer: Self-pay

## 2021-07-12 ENCOUNTER — Ambulatory Visit: Payer: Self-pay | Admitting: Physical Therapy

## 2021-07-12 DIAGNOSIS — M25552 Pain in left hip: Secondary | ICD-10-CM

## 2021-07-12 DIAGNOSIS — M6281 Muscle weakness (generalized): Secondary | ICD-10-CM

## 2021-07-12 DIAGNOSIS — G8929 Other chronic pain: Secondary | ICD-10-CM

## 2021-07-12 DIAGNOSIS — R293 Abnormal posture: Secondary | ICD-10-CM

## 2021-07-12 NOTE — Therapy (Signed)
?OUTPATIENT PHYSICAL THERAPY TREATMENT NOTE ? ? ?Patient Name: Mariah Lang ?MRN: [id number] ?DOB:17-Nov-1970, 51 y.o., female ?Today's Date: 07/12/2021 ? ?PCP: System, Provider Not In ?REFERRING PROVIDER: Gillis Ends* ? ? PT End of Session - 07/12/21 1622   ? ? Visit Number 12   ? Number of Visits 13   ? Date for PT Re-Evaluation 07/17/21   ? Authorization Type CAFA   ? PT Start Time [id number]   ? PT Stop Time 1657   ? PT Time Calculation (min) 39 min   ? Activity Tolerance Patient limited by pain   ? Behavior During Therapy [facility] for tasks assessed/performed   ? ?  ?  ? ?  ? ? ? ? ? ? ? ? ? ? ?Past Medical History:  ?Diagnosis Date  ? Anemia   ? ?Past Surgical History:  ?Procedure Laterality Date  ? APPENDECTOMY    ? CESAREAN SECTION  2000  ? KIDNEY SURGERY    ? LAPAROSCOPIC NEPHRECTOMY, HAND ASSISTED Left 02/17/2021  ? Procedure: HAND ASSISTED LAPAROSCOPIC RADICAL  NEPHRECTOMY;  Surgeon: Hollice Espy, MD;  Location: [facility] ORS;  Service: Urology;  Laterality: Left;  ? LAPAROTOMY  02/17/2021  ? Procedure: LAPAROTOMY OR HAND ASSIST;  Surgeon: Gillis Ends, MD;  Location: [facility] ORS;  Service: Gynecology;;  Fibroid excision   ? OTHER SURGICAL HISTORY  1993  ? c-section  ? PARTIAL HYSTERECTOMY    ? ROBOTIC ASSISTED LAPAROSCOPIC HYSTERECTOMY AND SALPINGECTOMY Bilateral 02/17/2021  ? Procedure: XI ROBOTIC ASSISTED LAPAROSCOPIC HYSTERECTOMY AND SALPINGECTOMY;  Surgeon: Gillis Ends, MD;  Location: [facility] ORS;  Service: Gynecology;  Laterality: Bilateral;  ? ?Patient Active Problem List  ? Diagnosis Date Noted  ? Hyponatremia 02/20/2021  ? Aspiration pneumonia of both lower lobes due to gastric secretions ([place]) 02/19/2021  ? Pulmonary embolism ([place]) 02/19/2021  ? Acute hypoxemic respiratory failure ([place]) 02/19/2021  ? Renal cell carcinoma ([place]) 02/17/2021  ? Ankle edema, bilateral 09/14/2020  ? Vitamin D deficiency 09/14/2020  ? Thrombocytosis 05/11/2019  ? Language barrier  12/31/2018  ? Screening breast examination 12/04/2018  ? Fibroid uterus 12/18/2014  ? Menorrhagia, premenopausal 11/24/2014  ? Iron deficiency anemia 11/24/2014  ? Iron deficiency anemia due to chronic blood loss 09/02/2014  ? Pap smear for cervical cancer screening 09/02/2014  ? Annual physical exam 09/02/2014  ? Dysmenorrhea 02/15/2013  ? Anemia due to Dysmenorrhea 02/15/2013  ? ? ?REFERRING PROVIDER: Gillis Ends* ?  ?REFERRING DIAG: Left-sided low back pain without sciatica, unspecified chronicity  ? ?THERAPY DIAG:  ?Chronic left-sided low back pain without sciatica ? ?Pain in left hip ? ?Muscle weakness (generalized) ? ?Abnormal posture ? ?PERTINENT HISTORY: Left renal mass and and uterine fibroids s/p LAPAROSCOPIC HYSTERECTOMY AND SALPINGECTOMY HAND ASSISTED LAPAROSCOPIC RADICAL NEPHRECTOMY on 02/17/2021 ? ?PRECAUTIONS: None ? ?In person interpreter utilized  ?SUBJECTIVE:  ?Patient reports she is good, she is having a little pain today. She reports consistency with exercises at home.  ? ?PAIN:  ?Are you having pain? Yes ?NPRS scale: 5/10 ?Pain location: Low back  ?PAIN TYPE: Throbbing  ?Pain description: Intermittent  ?Aggravating factors: Unknown  ?Relieving factors: Medication ? ?PATIENT GOALS: "for the pain to go away" ? ? ?OBJECTIVE:  ?*Unless otherwise noted all objective measures were captured on initial evaluation.  ? ?PATIENT SURVEYS:  ?FOTO 31% (28% at intake to 53% predicted) ?  ?POSTURE:  ?Slump sitting posture; shifts positions in sitting throughout evaluation due to pain.  ?  ?PALPATION: ?Hypersensitive to palpation  of Lt gluteals, piriformis, lumbar paraspinals ?Hypomobility L-spine  ?  ?LUMBARAROM/PROM ?  ?A/PROM A/PROM  ?06/03/2021 06/16/21  ?Flexion 25% limited increased LBP WNL; increased LBP  ?Extension WNL   ?Right lateral flexion 50% limited LBP WNL; no increase in pain  ?Left lateral flexion WNL WNL; no increase in pain  ?Right rotation 50% limited    ?Left rotation 50% limited     ? ?LE AROM/PROM: hip PROM WNL bilaterally, though increased pain with flexion, IR, and ER bilaterally  ?  ?LE MMT: ?  ?MMT Right ?06/03/2021 Left ?06/03/2021  ?Hip flexion 5/5 4-/5 LBP  ?Hip extension Unable to complete prone hip extension due to increased LBP Unable to complete prone hip extension due to increased LBP  ?Hip abduction 4-/5 4-/5   ?Hip adduction      ?Hip internal rotation      ?Hip external rotation      ?Knee flexion      ?Knee extension      ?Ankle dorsiflexion      ?Ankle plantarflexion      ?Ankle inversion      ?Ankle eversion      ? (Blank rows = not tested) ?  ?LUMBAR SPECIAL TESTS:  ?(-) SLR (+) FABER (+) Gaenslen's (-) Sacral Thrust (-) SI Compression/Distraction  ?  ?  ?TODAY'S TREATMENT  ?[facility] Adult PT Treatment:                                                DATE: 07/12/21 ?Therapeutic Exercise: ?NuStep L6 x 5 min with UE/LE while taking subjective ?LTR x 10 ?Bridge 2 x 10 ?SLR 4 x 5 on left (2 x 10 on right) ?Hooklying clamshell with green 2 x 10  ?Supine alternating march with green 2 x 10 ?Sidelying hip abduction 3 x 5 (left only) ?Sit to stand with 5# at chest 2 x 10 ?Seated physioball roll out 2 x 5 ?Deadlift 15# from 6" box 3 x 6 ?Standing row with blue 2 x 15 ?Squat at [facility] bar for lumbar flexion 2 x 10 ?Standing pallof press with blue 2 x 10 each ? ? ?[place] Adult PT Treatment:                                                DATE: 07/07/21 ?Therapeutic Exercise: ?NuStep L6 x 5 min with UE/LE while taking subjective ?Seated pelvic tilt x 10 ?Bridge 2 x 10 ?LTR x 10 ?Resisted LTR with green 2 x 5 each ?Hooklying clamshell with green 2 x 10  ?Hooklying adduction ball squeeze x 10 ?Supine alternating march with green 2 x 10 ?Supine bent knee fall out 2 x 10 each ?Sidelying hip abduction 2 x 5 (left only) ?Sit to stand x 10, with 5# at chest x 10 ?Standing extension with green 2 x 10 ?Standing row with blue 2 x 10 ?Standing pallof press with blue 2 x 10 each ?Squat at [facility] bar for lumbar flexion x  10 ?Seated lumbar flexion stretch with physioball rollout x 10 forward ?Deadlift from 6" box with 10# x 12 ? ?[facility] Adult PT Treatment:  DATE: 07/05/21 ?Therapeutic Exercise: ?NuStep L5 x 5 min with UE/LE while taking subjective ?Seated pelvic tilt 2 x 10 ?Bridge 2 x 10 ?Resisted LTR with green 2 x 5 each ?Hooklying clamshell with green 2 x 10  ?Hooklying adduction ball squeeze 2 x 10 ?Supine alternating march with green 2 x 10 ?Supine bent knee fall out 2 x 10 each ?Sidelying hip abduction 2 x 10 (left only) ?Sit to stand 2 x 10 ?Seated row with blue 2 x 10 ?Seated pallof press with blue 2 x 10 each ?Seated lumbar flexion stretch with physioball rollout  x 5 forward and x 5 to right ? ?[facility] Adult PT Treatment:                                                DATE: 06/30/21 ?Therapeutic Exercise: ?NuStep L4 x 5 min with UE/LE while taking subjective ?STS x 10 from mat ?SLR x 10 each - increased pain on left -cues for ab brace ?LTR x 10 min pain ?Hooklying adduction ball squeeze 2 x 10 ?Isometric ab press into 55 cm exercise ball, hooklying x10  ?Bridge with feet over 55 cm exercise ball ?Hamstring curls with feet on ball and ab brace ?Hooklying clamshell with Green 2 x 10  ?Supine alternating march 2 x 10- green band ?Supine bent knee fall out alternating 2 x 10- green band  ?LTR with feet on 55 cm ball -cues to maintain comfortable ROM ?Self Care: pelvic tilts while driving and before getting out of car or getting up. Use of Lumbar pillow sitting and driving. Avoid strain with transitions- log roll.  ? ?PATIENT EDUCATION:  ?Education details: HEP ?Person educated: patient/interpreter ?Education method: instruction, demo, cues, handout  ?Education comprehension: returned demo, cues required ? ?HOME EXERCISE PROGRAM: ?Access Code: [email or web address] ?  ? ?ASSESSMENT: ?CLINICAL IMPRESSION: ?Patient with improved tolerance for therapy, but continues to have increases in pain which limits  progress in therapy. She is able to use heavier weights and higher resistance bands with therapy, and continues to report reduced pain following therapy. Updated HEP this visit. Patient would benefit from c

## 2021-07-12 NOTE — Patient Instructions (Signed)
Access Code: [email or web address] ?URL: [email or web address] ?Date: 07/12/2021 ?Prepared by: Hilda Blades ? ?Exercises ?- Supine Lower Trunk Rotation  - 1-2 x daily - 10 reps - 5 seconds hold ?- Supine Transversus Abdominis Bracing - Hands on Stomach  - 1-2 x daily - 10 reps - 5 seconds hold ?- Supine March  - 1 x daily - 7 x weekly - 2 sets - 10 reps ?- Hooklying Clamshell with Resistance  - 1 x daily - 7 x weekly - 2 sets - 10 reps ?- Supine Pelvic Tilt  - 1 x daily - 7 x weekly - 2 sets - 10 reps ?- Supine Bridge  - 1 x daily - 7 x weekly - 2 sets - 10 reps ?- Straight Leg Raise  - 1 x daily - 7 x weekly - 2 sets - 10 reps ?- Sit to Stand Without Arm Support  - 1 x daily - 7 x weekly - 2 sets - 10 reps ?- Sidelying Hip Abduction  - 1 x daily - 7 x weekly - 2 sets - 10 reps ?

## 2021-07-13 NOTE — Therapy (Signed)
?OUTPATIENT PHYSICAL THERAPY TREATMENT NOTE ? ?DISCHARGE ? ? ?Patient Name: Mariah Lang ?MRN: [id number] ?DOB:Nov 08, 1970, 51 y.o., female ?Today's Date: 07/14/2021 ? ?PCP: System, Provider Not In ?REFERRING PROVIDER: Gillis Ends* ? ? PT End of Session - 07/14/21 1621   ? ? Visit Number 13   ? Number of Visits 13   ? Date for PT Re-Evaluation 07/17/21   ? Authorization Type CAFA   ? PT Start Time 1615   ? PT Stop Time 1655   ? PT Time Calculation (min) 40 min   ? Activity Tolerance Patient limited by pain   ? Behavior During Therapy [facility] for tasks assessed/performed   ? ?  ?  ? ?  ? ? ? ? ? ? ? ? ? ? ? ?Past Medical History:  ?Diagnosis Date  ? Anemia   ? ?Past Surgical History:  ?Procedure Laterality Date  ? APPENDECTOMY    ? CESAREAN SECTION  2000  ? KIDNEY SURGERY    ? LAPAROSCOPIC NEPHRECTOMY, HAND ASSISTED Left 02/17/2021  ? Procedure: HAND ASSISTED LAPAROSCOPIC RADICAL  NEPHRECTOMY;  Surgeon: Hollice Espy, MD;  Location: [facility] ORS;  Service: Urology;  Laterality: Left;  ? LAPAROTOMY  02/17/2021  ? Procedure: LAPAROTOMY OR HAND ASSIST;  Surgeon: Gillis Ends, MD;  Location: [facility] ORS;  Service: Gynecology;;  Fibroid excision   ? OTHER SURGICAL HISTORY  1993  ? c-section  ? PARTIAL HYSTERECTOMY    ? ROBOTIC ASSISTED LAPAROSCOPIC HYSTERECTOMY AND SALPINGECTOMY Bilateral 02/17/2021  ? Procedure: XI ROBOTIC ASSISTED LAPAROSCOPIC HYSTERECTOMY AND SALPINGECTOMY;  Surgeon: Gillis Ends, MD;  Location: [facility] ORS;  Service: Gynecology;  Laterality: Bilateral;  ? ?Patient Active Problem List  ? Diagnosis Date Noted  ? Hyponatremia 02/20/2021  ? Aspiration pneumonia of both lower lobes due to gastric secretions ([place]) 02/19/2021  ? Pulmonary embolism ([place]) 02/19/2021  ? Acute hypoxemic respiratory failure ([place]) 02/19/2021  ? Renal cell carcinoma ([place]) 02/17/2021  ? Ankle edema, bilateral 09/14/2020  ? Vitamin D deficiency 09/14/2020  ? Thrombocytosis 05/11/2019  ? Language  barrier 12/31/2018  ? Screening breast examination 12/04/2018  ? Fibroid uterus 12/18/2014  ? Menorrhagia, premenopausal 11/24/2014  ? Iron deficiency anemia 11/24/2014  ? Iron deficiency anemia due to chronic blood loss 09/02/2014  ? Pap smear for cervical cancer screening 09/02/2014  ? Annual physical exam 09/02/2014  ? Dysmenorrhea 02/15/2013  ? Anemia due to Dysmenorrhea 02/15/2013  ? ? ?REFERRING PROVIDER: Gillis Ends* ?  ?REFERRING DIAG: Left-sided low back pain without sciatica, unspecified chronicity  ? ?THERAPY DIAG:  ?Chronic left-sided low back pain without sciatica ? ?Pain in left hip ? ?Muscle weakness (generalized) ? ?Abnormal posture ? ?PERTINENT HISTORY: Left renal mass and and uterine fibroids s/p LAPAROSCOPIC HYSTERECTOMY AND SALPINGECTOMY HAND ASSISTED LAPAROSCOPIC RADICAL NEPHRECTOMY on 02/17/2021 ? ?PRECAUTIONS: None ? ?In person interpreter utilized  ?SUBJECTIVE:  ?Patient reports she is doing well. She continues to have instances of severe low back pain but does feel like therapy has really helped her get better.  ? ?PAIN:  ?Are you having pain? Yes ?NPRS scale: 5/10 ?Pain location: Low back  ?PAIN TYPE: Throbbing, sharp ?Pain description: Intermittent  ?Aggravating factors: Unknown  ?Relieving factors: Medication ? ?PATIENT GOALS: "for the pain to go away" ? ? ?OBJECTIVE:  ?*Unless otherwise noted all objective measures were captured on initial evaluation.  ? ?PATIENT SURVEYS:  ?FOTO 31% - 07/14/2021 (31% - 06/26/2021, 28% at intake) ?  ?POSTURE:  ?Slump sitting posture; shifts positions in  sitting throughout evaluation due to pain.  ?  ?PALPATION: ?Hypersensitive to palpation of Lt gluteals, piriformis, lumbar paraspinals ?Hypomobility L-spine  ?  ?LUMBARAROM/PROM ?  ?A/PROM A/PROM  ?06/03/2021 06/16/21 07/14/2021  ?Flexion 25% limited increased LBP WNL; increased LBP WNL  ?Extension WNL  75%  ?Right lateral flexion 50% limited LBP WNL; no increase in pain 75%  ?Left lateral flexion  WNL WNL; no increase in pain 75%  ?Right rotation 50% limited   75%  ?Left rotation 50% limited   75%  ?*All lumbar motion causes increased pain ? ?LE AROM/PROM: hip PROM WNL bilaterally, though increased pain with flexion, IR, and ER bilaterally  ?  ?LE MMT: ?  ?MMT Right ?06/03/2021 Left ?06/03/2021 Lt ?07/14/2021  ?Hip flexion 5/5 4-/5 LBP 4-  ?Hip extension Unable to complete prone hip extension due to increased LBP Unable to complete prone hip extension due to increased LBP 4-  ?Hip abduction 4-/5 4-/5  4-  ?Hip adduction       ?Hip internal rotation       ?Hip external rotation       ?Knee flexion       ?Knee extension       ?Ankle dorsiflexion       ?Ankle plantarflexion       ?Ankle inversion       ?Ankle eversion       ? *Left hip strength testing causes increased back pain ?  ?LUMBAR SPECIAL TESTS:  ?(-) SLR (+) FABER (+) Gaenslen's (-) Sacral Thrust (-) SI Compression/Distraction  ?  ?  ?TODAY'S TREATMENT  ?[facility] Adult PT Treatment:                                                DATE: 07/14/21 ?Therapeutic Exercise: ?NuStep L6 x 5 min with UE/LE while taking subjective ?LTR 2 x 10 ?Bridge 2 x 5 ?SLR 2 x 5 on left (2 x 10 on right) ?Hooklying clamshell with green 2 x 10  ?Supine alternating march with green 2 x 10 ?Sidelying hip abduction 2 x 5 (left only) ?Sit to stand 2 x 10 ?Standing row with green 2 x 15 ?Standing pallof press with green 2 x 10 each ? ? ?[place] Adult PT Treatment:                                                DATE: 07/12/21 ?Therapeutic Exercise: ?NuStep L6 x 5 min with UE/LE while taking subjective ?LTR x 10 ?Bridge 2 x 10 ?SLR 4 x 5 on left (2 x 10 on right) ?Hooklying clamshell with green 2 x 10  ?Supine alternating march with green 2 x 10 ?Sidelying hip abduction 3 x 5 (left only) ?Sit to stand with 5# at chest 2 x 10 ?Seated physioball roll out 2 x 5 ?Deadlift 15# from 6" box 3 x 6 ?Standing row with blue 2 x 15 ?Squat at [facility] bar for lumbar flexion 2 x 10 ?Standing pallof press with blue 2  x 10 each ? ?[facility] Adult PT Treatment:  DATE: 07/07/21 ?Therapeutic Exercise: ?NuStep L6 x 5 min with UE/LE while taking subjective ?Seated pelvic tilt x 10 ?Bridge 2 x 10 ?LTR x 10 ?Resisted LTR with green 2 x 5 each ?Hooklying clamshell with green 2 x 10  ?Hooklying adduction ball squeeze x 10 ?Supine alternating march with green 2 x 10 ?Supine bent knee fall out 2 x 10 each ?Sidelying hip abduction 2 x 5 (left only) ?Sit to stand x 10, with 5# at chest x 10 ?Standing extension with green 2 x 10 ?Standing row with blue 2 x 10 ?Standing pallof press with blue 2 x 10 each ?Squat at [facility] bar for lumbar flexion x 10 ?Seated lumbar flexion stretch with physioball rollout x 10 forward ?Deadlift from 6" box with 10# x 12 ? ?PATIENT EDUCATION:  ?Education details: POC discharge due to lack of progress, FOTO, HEP finalization ?Person educated: Patient/interpreter ?Education method: Instruction, demo, cues, handout  ?Education comprehension: returned demo, cues required ? ?HOME EXERCISE PROGRAM: ?Access Code: [email or web address] ?  ? ?ASSESSMENT: ?CLINICAL IMPRESSION: ?Patient continues to report severe instances of pain with with activity that has minimally improved with therapy. She has not met any of her LTGs. She did report some improvement in therapy but she will be discharged this visit due to lack of progress and she was encouraged to follow up with her referring provided to discuss further options. ? ?  ?OBJECTIVE IMPAIRMENTS decreased activity tolerance, decreased knowledge of condition, decreased ROM, decreased strength, hypomobility, increased fascial restrictions, increased muscle spasms, impaired flexibility, postural dysfunction, obesity, and pain.  ?  ?ACTIVITY LIMITATIONS cleaning, community activity, driving, occupation, laundry, and shopping.  ?  ?PERSONAL FACTORS Fitness, Time since onset of injury/illness/exacerbation, and 1 comorbidity: recent surgery  are also affecting  patient's functional outcome.  ?  ?  ?GOALS: ?Goals reviewed with patient? No ?  ?SHORT TERM GOALS: ?  ?STG Name Target Date Goal status  ?1 Patient will demonstrate knowledge and application of appropri

## 2021-07-14 ENCOUNTER — Other Ambulatory Visit: Payer: Self-pay

## 2021-07-14 ENCOUNTER — Ambulatory Visit: Payer: Self-pay | Admitting: Physical Therapy

## 2021-07-14 ENCOUNTER — Encounter: Payer: Self-pay | Admitting: Physical Therapy

## 2021-07-14 DIAGNOSIS — M6281 Muscle weakness (generalized): Secondary | ICD-10-CM

## 2021-07-14 DIAGNOSIS — M25552 Pain in left hip: Secondary | ICD-10-CM

## 2021-07-14 DIAGNOSIS — R293 Abnormal posture: Secondary | ICD-10-CM

## 2021-07-14 DIAGNOSIS — G8929 Other chronic pain: Secondary | ICD-10-CM

## 2021-07-14 NOTE — Patient Instructions (Signed)
Access Code: [email or web address] ?URL: [email or web address] ?Date: 07/14/2021 ?Prepared by: Hilda Blades ? ?Exercises ?- Supine Lower Trunk Rotation  - 1 x daily - 10 reps ?- Bridge  - 1 x daily - 2 sets - 10 reps ?- Hooklying Clamshell with Resistance  - 1 x daily - 2 sets - 10 reps ?- Supine March with Resistance Band  - 1 x daily - 2 sets - 10 reps ?- Straight Leg Raise  - 1 x daily - 2 sets - 5 reps ?- Sidelying Hip Abduction  - 1 x daily - 2 sets - 10 reps ?- Seated Pelvic Tilt  - 1 x daily - 2 sets - 10 reps ?- Sit to Stand Without Arm Support  - 1 x daily - 2 sets - 10 reps ?- Standing Row with Anchored Resistance  - 1 x daily - 2 sets - 10 reps ?- Standing Anti-Rotation Press with Anchored Resistance  - 1 x daily - 2 sets - 10 reps ?

## 2021-07-27 ENCOUNTER — Encounter: Payer: Self-pay | Admitting: Physician Assistant

## 2021-07-27 ENCOUNTER — Ambulatory Visit (INDEPENDENT_AMBULATORY_CARE_PROVIDER_SITE_OTHER): Payer: Self-pay | Admitting: Physician Assistant

## 2021-07-27 VITALS — BP 126/74 | HR 79 | Temp 98.2°F | Resp 18 | Ht 59.0 in | Wt 200.0 lb

## 2021-07-27 DIAGNOSIS — Z6841 Body Mass Index (BMI) 40.0 and over, adult: Secondary | ICD-10-CM

## 2021-07-27 DIAGNOSIS — G8929 Other chronic pain: Secondary | ICD-10-CM

## 2021-07-27 DIAGNOSIS — M5442 Lumbago with sciatica, left side: Secondary | ICD-10-CM

## 2021-07-27 MED ORDER — METHYLPREDNISOLONE ACETATE 80 MG/ML IJ SUSP
80.0000 mg | Freq: Once | INTRAMUSCULAR | Status: AC
  Administered 2021-07-27: 80 mg via INTRAMUSCULAR

## 2021-07-27 NOTE — Progress Notes (Signed)
? ?Established Patient Office Visit ? ?Subjective   ?Patient ID: Mariah Lang, female    DOB: Jun 25, 1970  Age: 51 y.o. MRN: [id number] ? ?Chief Complaint  ?Patient presents with  ? Medication Refill  ? ? ?States that she continues to suffer from low left back pain, describes it as pain in her left buttock, feels like an electrical shock, and will travel up her back.  States that her pain started after having surgery for her hysterectomy as well as her left kidney removed.  States that she has been using hydrocodone and Robaxin without relief.  States that she has been going to physical therapy without relief.  States pain is worse when sitting for long periods of time.  Denies numbness or tingling. ? ?Due to language barrier, an interpreter was present during the history-taking and subsequent discussion (and for part of the physical exam) with this patient. ? ? ? ?Past Medical History:  ?Diagnosis Date  ? Anemia   ? ?Social History  ? ?Socioeconomic History  ? Marital status: Divorced  ?  Spouse name: Not on file  ? Number of children: 2  ? Years of education: Not on file  ? Highest education level: 9th grade  ?Occupational History  ? Not on file  ?Tobacco Use  ? Smoking status: Never  ? Smokeless tobacco: Never  ?Vaping Use  ? Vaping Use: Never used  ?Substance and Sexual Activity  ? Alcohol use: Not Currently  ? Drug use: Not Currently  ? Sexual activity: Yes  ?  Birth control/protection: Surgical, None  ?Other Topics Concern  ? Not on file  ?Social History Narrative  ? ** Merged History Encounter **  ?    ? ** Merged History Encounter **  ?    ? ?Social Determinants of Health  ? ?Financial Resource Strain: Not on file  ?Food Insecurity: Not on file  ?Transportation Needs: Not on file  ?Physical Activity: Not on file  ?Stress: Not on file  ?Social Connections: Not on file  ?Intimate Partner Violence: Not on file  ? ?Family History  ?Problem Relation Age of Onset  ? Migraines Mother   ?  Hypertension Mother   ? Migraines Father   ? Hypertension Father   ? Diabetes Mother   ? Diabetes Paternal Grandmother   ? ?No Known Allergies ?  ? ?Review of Systems  ?Constitutional: Negative.   ?HENT: Negative.    ?Eyes: Negative.   ?Respiratory:  Negative for shortness of breath.   ?Cardiovascular:  Negative for chest pain.  ?Gastrointestinal:  Negative for abdominal pain, constipation, diarrhea, nausea and vomiting.  ?Genitourinary:  Negative for dysuria and hematuria.  ?Musculoskeletal:  Positive for back pain.  ?Skin: Negative.   ?Neurological: Negative.   ?Endo/Heme/Allergies: Negative.   ?Psychiatric/Behavioral: Negative.    ? ?  ?Objective:  ?  ? ?BP 126/74 (BP Location: Right Arm, Patient Position: Sitting, Cuff Size: Normal)   Pulse 79   Temp 98.2 ?F (36.8 ?C) (Oral)   Resp 18   Ht '4\' 11"'$  (1.499 m)   Wt 200 lb (90.7 kg)   LMP 01/18/2019   SpO2 96%   BMI 40.40 kg/m?  ? ? ?Physical Exam ?Vitals and nursing note reviewed.  ?Constitutional:   ?   Appearance: Normal appearance. She is obese.  ?HENT:  ?   Head: Normocephalic and atraumatic.  ?   Right Ear: External ear normal.  ?   Left Ear: External ear normal.  ?  Nose: Nose normal.  ?   Mouth/Throat:  ?   Mouth: Mucous membranes are moist.  ?   Pharynx: Oropharynx is clear.  ?Cardiovascular:  ?   Rate and Rhythm: Normal rate and regular rhythm.  ?   Pulses: Normal pulses.  ?   Heart sounds: Normal heart sounds.  ?Pulmonary:  ?   Effort: Pulmonary effort is normal.  ?   Breath sounds: Normal breath sounds.  ?Musculoskeletal:     ?   General: Normal range of motion.  ?   Cervical back: Normal, normal range of motion and neck supple.  ?   Thoracic back: Normal.  ?   Lumbar back: Tenderness present.  ?   Comments: Point tenderness lower right near top of buttocks.  ?Skin: ?   General: Skin is warm and dry.  ?Neurological:  ?   General: No focal deficit present.  ?   Mental Status: She is alert and oriented to person, place, and time.  ?Psychiatric:      ?   Mood and Affect: Mood normal.     ?   Behavior: Behavior normal.     ?   Thought Content: Thought content normal.     ?   Judgment: Judgment normal.  ? ? ? ?  ?Assessment & Plan:  ? ?Problem List Items Addressed This Visit   ?None ?Visit Diagnoses   ? ? Chronic left-sided low back pain with left-sided sciatica    -  Primary  ? Relevant Medications  ? methylPREDNISolone acetate (DEPO-MEDROL) injection 80 mg (Completed)  ? Other Relevant Orders  ? Ambulatory referral to Orthopedic Surgery  ? ?  ? ?1. Chronic left-sided low back pain with left-sided sciatica ?Refer to orthopedic's for further evaluation.  Patient education given on supportive care, red flags given for prompt reevaluation. ?- Ambulatory referral to Orthopedic Surgery ?- methylPREDNISolone acetate (DEPO-MEDROL) injection 80 mg ? ?2. Class 3 severe obesity due to excess calories with body mass index (BMI) of 40.0 to 44.9 in adult, unspecified whether serious comorbidity present ([place]) ? ? ? ?I have reviewed the patient's medical history (PMH, PSH, Social History, Family History, Medications, and allergies) , and have been updated if relevant. I spent 30 minutes reviewing chart and  face to face time with patient. ? ? ? ?Return if symptoms worsen or fail to improve.  ? ? ?Bryahna Lesko S Mayers, PA-C ? ?

## 2021-07-27 NOTE — Patient Instructions (Signed)
I have started a referral for you to be seen by orthopedics for further evaluation. ? ?Mariah Rad, PA-C ?Physician Assistant ?[facility] [place] [place] ?[email or web address] ? ? ?Dolor de espalda cr?nico ?Chronic Back Pain ?Cuando el dolor en la espalda dura m?s de 3 meses, se denomina dolor de espalda cr?nico. Es posible que se desconozca la causa de esta afecci?n. Algunas causas frecuentes son las siguientes: ?Desgaste (enfermedad degenerativa) de los huesos, los ligamentos o los discos de la espalda. ?Inflamaci?n y rigidez en la espalda (artritis). ?Las personas que sufren dolor de espalda cr?nico generalmente atraviesan determinados per?odos en los que este es m?s intenso (episodios de exacerbaci?n del dolor). Muchas personas pueden aprender a Financial controller de espalda con el cuidado en Engineer, mining. ?Siga estas instrucciones en su casa: ?Est? atento a cualquier cambio en los s?ntomas. Tome estas medidas para Theatre stage manager dolor: ?Control del dolor y de la rigidez ? ?  ? ?Si se lo indican, aplique hielo sobre la zona dolorida. El m?dico puede recomendarle que se aplique hielo durante las primeras 24 a 1 horas despu?s del comienzo de un episodio de exacerbaci?n del dolor. Para hacer esto: ?Ponga el hielo en una bolsa pl?stica. ?Coloque una [employer] piel y Therapist, nutritional. ?Coloque el hielo durante 20 minutos, 2 a 3 veces al d?a. ?Si se lo indican, aplique calor en la zona afectada con la frecuencia que le haya indicado el m?dico. Use la fuente de calor que el m?dico le recomiende, como una compresa de calor h?medo o una almohadilla t?rmica. ?Coloque una [employer] piel y la fuente de Freight forwarder. ?Aplique calor durante 20 a 30 minutos. ?Retire la fuente de calor si la piel se pone de color rojo brillante. Esto es especialmente importante si no puede sentir dolor, calor o fr?o. Puede correr un riesgo mayor de sufrir quemaduras. ?Intente tomar un ba?o de inmersi?n con agua  caliente. ?Actividad ? ?Evite agacharse y Optometrist otras actividades que agraven el problema. ?Mantenga una [employer] est? de pie o sentado: ?Cuando est? de pie, mantenga la parte alta de la espalda y el cuello rectos, con los hombros [place] atr?s. Evite encorvarse. ?Cuando est? sentado, mantenga la espalda recta y relaje los hombros. No curve los hombros ni los [place] atr?s. ?No permanezca sentado o de pie en el mismo lugar durante mucho tiempo. ?Durante el d?a, descanse durante lapsos breves. Esto le Tourist information centre manager. Descansar recostado o de pie suele ser mejor que hacerlo sentado. ?Cuando descanse durante per?odos m?s largos, incorpore alguna [place] o ejercicios de elongaci?n entre uno y [place]. Esto ayudar? a Mining engineer rigidez y Conservation officer, historic buildings. ?Haga ejercicio con regularidad. Preg?ntele al m?dico qu? actividades son seguras para usted. ?No levante ning?n objeto que pese m?s de 10 libras (4.5 kg) o que supere el l?mite de [employer] hayan indicado, hasta que el m?dico le diga que puede [place]. Siempre use las t?cnicas correctas para levantar objetos, entre ellas: ?Flexionar las rodillas. ?Mantener la carga cerca del cuerpo. ?No torcerse. ?Duerma sobre un colch?n firme en una posici?n c?moda. Intente acostarse de costado, con las rodillas ligeramente flexionadas. Si se recuesta [employer], coloque una almohada debajo de las rodillas. ?Medicamentos ?El tratamiento puede incluir medicamentos para Conservation officer, historic buildings y la inflamaci?n que se toman por boca o que se aplican sobre la piel, analg?sicos recetados o relajantes musculares. Use los medicamentos de venta libre y los recetados solamente como se lo haya indicado el m?dico. ?Preg?ntele  al m?dico si el medicamento recetado: ?Hace necesario que evite conducir o Musician. ?Puede causarle estre?imiento. Es posible que tenga que tomar estas medidas para prevenir o tratar el estre?imiento: ?Electronics engineer suficiente l?quido como para Theatre manager la  orina de color [place] p?lido. ?Usar medicamentos recetados o de Radio broadcast assistant. ?Consumir alimentos ricos en fibra, como frijoles, cereales integrales, y frutas y verduras frescas. ?Limitar el consumo de alimentos ricos en grasa y az?cares procesados, como los alimentos fritos o dulces. ?Instrucciones generales ?No consuma ning?n producto que contenga nicotina o tabaco, como cigarrillos, cigarrillos electr?nicos y tabaco de Higher education careers adviser. Si necesita ayuda para dejar de consumir estos productos, consulte al m?dico. ?Concurra a todas las visitas de seguimiento como se lo haya indicado el m?dico. Esto es importante. ?Comun?quese con un m?dico si: ?Siente un dolor que no se alivia con reposo o medicamentos. ?Su dolor empeora o tiene un dolor nuevo. ?Tiene fiebre alta. ?Pierde de peso con rapidez. ?Tiene dificultad para [employer] cotidianas. ?Solicite ayuda de inmediato si: ?Siente debilidad o adormecimiento en una o ambas piernas, o en uno o ambos pies. ?Tiene dificultad para controlar la micci?n o la defecaci?n. ?Siente un dolor intenso en la espalda y tiene alguno de los siguientes s?ntomas: ?N?useas o v?mitos. ?Dolor en el abdomen. ?Le falta el aire o se desmaya. ?Resumen ?El dolor de espalda cr?nico es aquel que dura m?s de 3 meses. ?Cuando comienza un episodio de exacerbaci?n del dolor, aplique hielo en la zona dolorida durante las primeras 24 a 48 horas. ?Apl?quese una compresa de calor h?medo o una almohadilla t?rmica como se lo haya indicado el m?dico. ?Cuando descanse durante per?odos m?s largos, incorpore alguna actividad suave o ejercicios de elongaci?n entre uno y [place]. Esto ayudar? a Mining engineer rigidez y Conservation officer, historic buildings. ?Esta informaci?n no tiene Marine scientist el consejo del m?dico. Aseg?rese de hacerle al m?dico cualquier pregunta que tenga. ?Document Revised: 07/16/2019 Document Reviewed: 07/16/2019 ?Elsevier Patient Education ? [place]. ? ?

## 2021-08-05 ENCOUNTER — Encounter: Payer: Self-pay | Admitting: Surgery

## 2021-08-05 ENCOUNTER — Ambulatory Visit: Payer: Self-pay

## 2021-08-05 ENCOUNTER — Ambulatory Visit (INDEPENDENT_AMBULATORY_CARE_PROVIDER_SITE_OTHER): Payer: Self-pay | Admitting: Surgery

## 2021-08-05 ENCOUNTER — Other Ambulatory Visit: Payer: Self-pay

## 2021-08-05 VITALS — BP 119/80 | HR 76 | Ht 59.0 in | Wt 200.0 lb

## 2021-08-05 DIAGNOSIS — M545 Low back pain, unspecified: Secondary | ICD-10-CM

## 2021-08-05 DIAGNOSIS — M5416 Radiculopathy, lumbar region: Secondary | ICD-10-CM

## 2021-08-05 DIAGNOSIS — M4316 Spondylolisthesis, lumbar region: Secondary | ICD-10-CM

## 2021-08-05 MED ORDER — METHYLPREDNISOLONE 4 MG PO TBPK
ORAL_TABLET | ORAL | 0 refills | Status: DC
  Filled 2021-08-05: qty 21, 6d supply, fill #0

## 2021-08-05 NOTE — Progress Notes (Signed)
? ?Office Visit Note ?  ?Patient: Mariah Lang           ?Date of Birth: Sep 19, 1970           ?MRN: [id number] ?Visit Date: 08/05/2021 ?             ?Requested by: Mayers, Loraine Grip, PA-C ?[facility] [address] ?[place],  [place] [address] ?PCP: System, Provider Not In ? ? ?Assessment & Plan: ?Visit Diagnoses:  ?1. Acute bilateral low back pain, unspecified whether sciatica present   ?2. Spondylolisthesis, lumbar region   ?3. Radiculopathy, lumbar region   ? ? ?Plan: With patient's ongoing low back pain and lower extremity radiculopathy that has failed conservative treatment with medication management, physical therapy, activity modification over the last few months I will schedule lumbar MRI to rule out HNP/stenosis.  Patient does have a slight L5-S1 spondylolisthesis.  I will have her follow-up with Dr. Louanne Skye in 3 weeks to discuss results and further treatment options.  I did send in a prescription for Medrol Dosepak 6-day taper to be taken as directed.  Hopefully this will help to give her some improvement. ? ?Follow-Up Instructions: Return in about 3 weeks (around 08/26/2021) for with dr Louanne Skye to review lumbar mri.  ? ?Orders:  ?Orders Placed This Encounter  ?Procedures  ? XR Lumbar Spine 2-3 Views  ? MR Lumbar Spine w/o contrast  ? ?Meds ordered this encounter  ?Medications  ? methylPREDNISolone (MEDROL) 4 MG TBPK tablet  ?  Sig: 6 day taper dose take as directed on package  ?  Dispense:  21 tablet  ?  Refill:  0  ? ? ? ? Procedures: ?No procedures performed ? ? ?Clinical Data: ?No additional findings. ? ? ?Subjective: ?Chief Complaint  ?Patient presents with  ? Lower Back - Pain  ? ? ?HPI ?51 year old Hispanic female comes in with interpreter today for evaluation of low back pain and bilateral lower extremity radiculopathy that has been going on since January 2023.  Patient states that she had OB/GYN surgery November 2022 and her issues with her low back started shortly after.  She has been  followed by her primary care provider for this and has had medication management which includes muscle relaxers and recently a Depo-Medrol IM injection.  She had slight improvement with the injection.  She has also had formal PT over the last few months.  States that low back pain is constant and radiates down to both hips.  Aggravated with sitting, twisting, bending, walking.  I advised patient that I am not quite sure as to why her primary care provider did not order a lumbar MRI before referring her to our clinic. ?Review of Systems ?No current cardiopulmonary GI/GU issues ? ?Objective: ?Vital Signs: BP 119/80   Pulse 76   Ht '4\' 11"'$  (1.499 m)   Wt 200 lb (90.7 kg)   LMP 01/18/2019   BMI 40.40 kg/m?  ? ?Physical Exam ?Constitutional:   ?   Appearance: She is obese.  ?HENT:  ?   Head: Normocephalic and atraumatic.  ?Eyes:  ?   Extraocular Movements: Extraocular movements intact.  ?Pulmonary:  ?   Effort: No respiratory distress.  ?Musculoskeletal:  ?   Comments: Gait is somewhat antalgic.  Pain with lumbar extension.  Positive bilateral lumbar paraspinal tenderness/spasm.  Tenderness around the bilateral SI joints.  Negative logroll bilateral hips.  Positive left straight leg raise.  ?Neurological:  ?   General: No focal deficit present.  ?  Mental Status: She is alert and oriented to person, place, and time.  ?Psychiatric:     ?   Mood and Affect: Mood normal.  ? ? ?Ortho Exam ? ?Specialty Comments:  ?No specialty comments available. ? ?Imaging: ?No results found. ? ? ?PMFS History: ?Patient Active Problem List  ? Diagnosis Date Noted  ? Hyponatremia 02/20/2021  ? Aspiration pneumonia of both lower lobes due to gastric secretions ([place]) 02/19/2021  ? Pulmonary embolism ([place]) 02/19/2021  ? Acute hypoxemic respiratory failure ([place]) 02/19/2021  ? Renal cell carcinoma ([place]) 02/17/2021  ? Ankle edema, bilateral 09/14/2020  ? Vitamin D deficiency 09/14/2020  ? Thrombocytosis 05/11/2019  ? Language barrier  12/31/2018  ? Screening breast examination 12/04/2018  ? Fibroid uterus 12/18/2014  ? Menorrhagia, premenopausal 11/24/2014  ? Iron deficiency anemia 11/24/2014  ? Iron deficiency anemia due to chronic blood loss 09/02/2014  ? Pap smear for cervical cancer screening 09/02/2014  ? Annual physical exam 09/02/2014  ? Dysmenorrhea 02/15/2013  ? Anemia due to Dysmenorrhea 02/15/2013  ? ?Past Medical History:  ?Diagnosis Date  ? Anemia   ?  ?Family History  ?Problem Relation Age of Onset  ? Migraines Mother   ? Hypertension Mother   ? Migraines Father   ? Hypertension Father   ? Diabetes Mother   ? Diabetes Paternal Grandmother   ?  ?Past Surgical History:  ?Procedure Laterality Date  ? APPENDECTOMY    ? CESAREAN SECTION  2000  ? KIDNEY SURGERY    ? LAPAROSCOPIC NEPHRECTOMY, HAND ASSISTED Left 02/17/2021  ? Procedure: HAND ASSISTED LAPAROSCOPIC RADICAL  NEPHRECTOMY;  Surgeon: Hollice Espy, MD;  Location: [facility];  Service: Urology;  Laterality: Left;  ? LAPAROTOMY  02/17/2021  ? Procedure: LAPAROTOMY OR HAND ASSIST;  Surgeon: Gillis Ends, MD;  Location: [facility];  Service: Gynecology;;  Fibroid excision   ? OTHER SURGICAL HISTORY  1993  ? c-section  ? PARTIAL HYSTERECTOMY    ? ROBOTIC ASSISTED LAPAROSCOPIC HYSTERECTOMY AND SALPINGECTOMY Bilateral 02/17/2021  ? Procedure: XI ROBOTIC ASSISTED LAPAROSCOPIC HYSTERECTOMY AND SALPINGECTOMY;  Surgeon: Gillis Ends, MD;  Location: [facility];  Service: Gynecology;  Laterality: Bilateral;  ? ?Social History  ? ?Occupational History  ? Not on file  ?Tobacco Use  ? Smoking status: Never  ? Smokeless tobacco: Never  ?Vaping Use  ? Vaping Use: Never used  ?Substance and Sexual Activity  ? Alcohol use: Not Currently  ? Drug use: Not Currently  ? Sexual activity: Yes  ?  Birth control/protection: Surgical, None  ? ? ? ? ? ? ?

## 2021-08-14 ENCOUNTER — Ambulatory Visit (HOSPITAL_COMMUNITY)
Admission: RE | Admit: 2021-08-14 | Discharge: 2021-08-14 | Disposition: A | Discharge status: Home / Self Care | Payer: Self-pay | Source: Ambulatory Visit | Attending: Surgery | Admitting: Surgery

## 2021-08-14 DIAGNOSIS — M5416 Radiculopathy, lumbar region: Secondary | ICD-10-CM | POA: Insufficient documentation

## 2021-08-14 DIAGNOSIS — M4316 Spondylolisthesis, lumbar region: Secondary | ICD-10-CM | POA: Insufficient documentation

## 2021-08-14 IMAGING — MR MR LUMBAR SPINE W/O CM
5 series · 31 of 48 positions shown · non-contrast
Comparison: None Available.

CLINICAL DATA: Low back pain, symptoms persist with > 6 wks
treatment Lumbar radiculopathy, symptoms persist with > 6 wks
treatment chronic low back pain and bilateral hip pain. failed
conservative treatment. Mild L5-S1 anterolisthesis

EXAM:
MRI LUMBAR SPINE WITHOUT CONTRAST
TECHNIQUE: Multiplanar, multisequence MR imaging of the lumbar spine was
performed. No intravenous contrast was administered.

[Series 5: T1 · sagittal · 4.0mm · 0.81mm/px · 6 of 17 slices shown (1 of 2)]
[im 1/17]
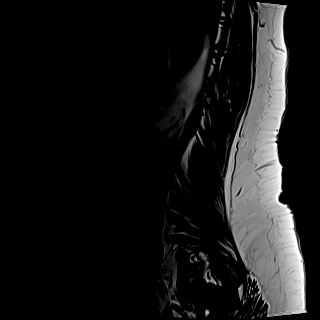
[im 4/17]
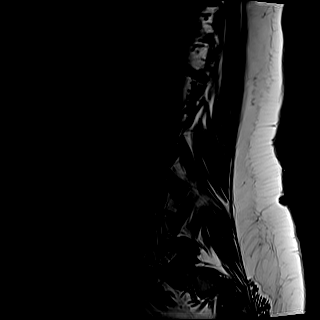
[im 7/17]
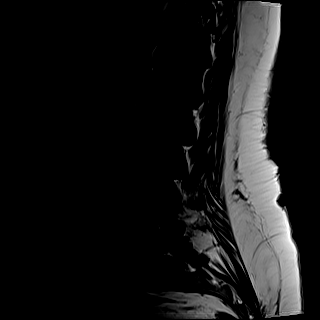
[im 10/17]
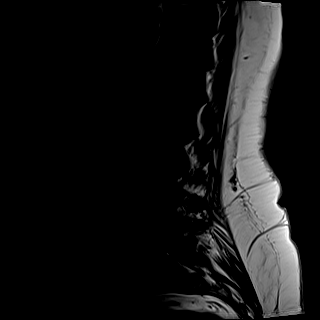
[im 13/17]
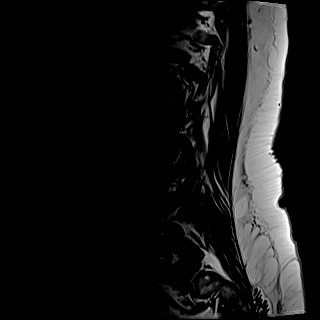
[im 17/17]
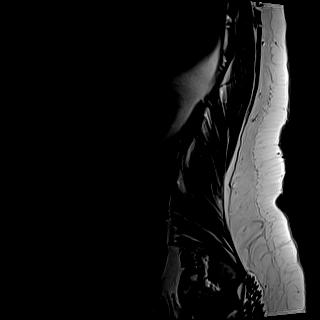

[Series 6: T2 · sagittal · 4.0mm · 0.81mm/px · 6 of 17 slices shown (1 of 2)]
[im 1/17]
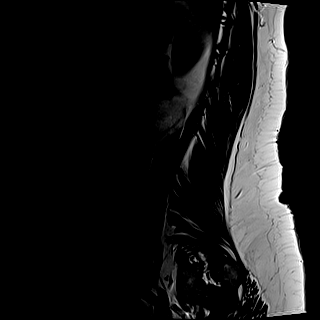
[im 4/17]
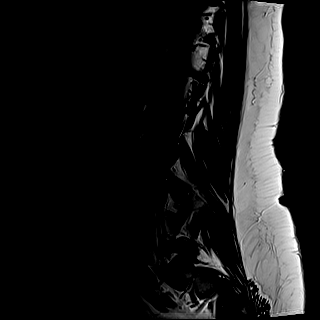
[im 7/17]
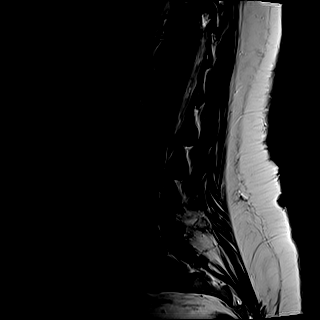
[im 10/17]
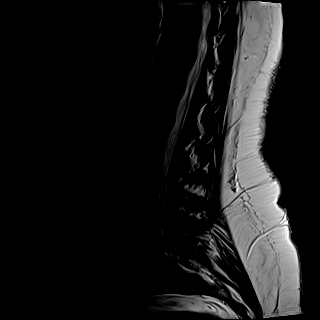
[im 13/17]
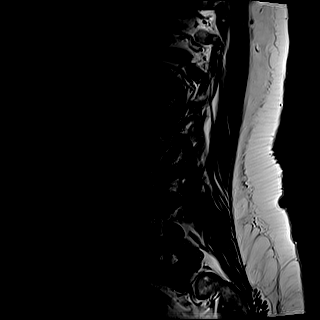
[im 17/17]
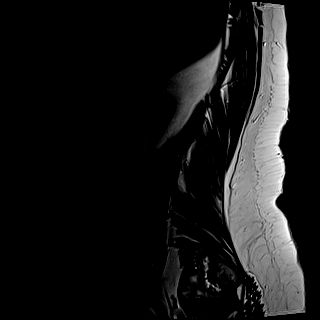

[Series 7: STIR · sagittal · 4.0mm · 0.51mm/px · 1 of 17 slices shown]
[im 1/17]
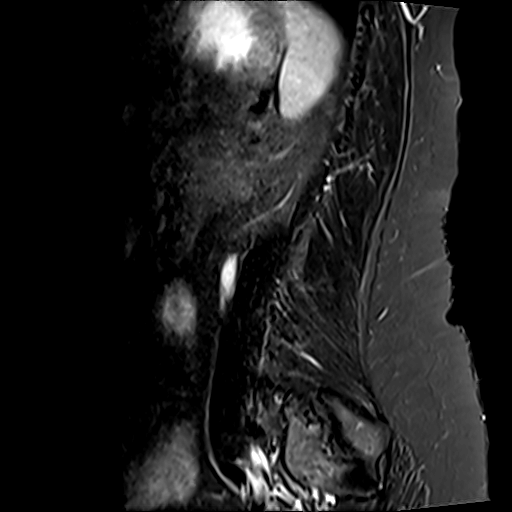

[Series 8: T2 · axial · 4.0mm · 0.62mm/px · z∈[-28,+192]mm · 9 of 45 slices shown (2 of 2)]
[im 1/45]
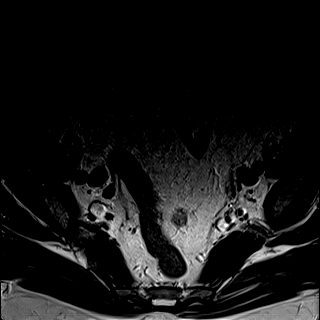
[im 7/45]
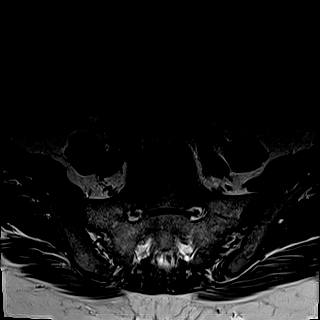
[im 13/45]
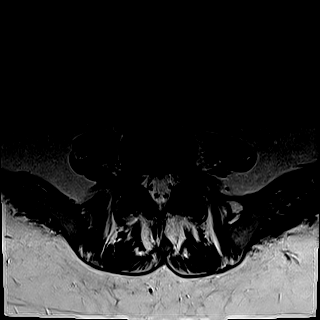
[im 19/45]
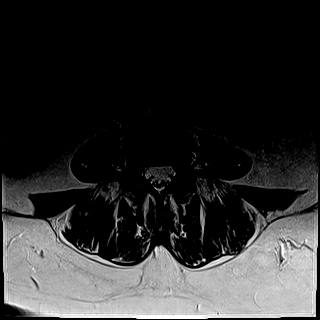
[im 23/45]
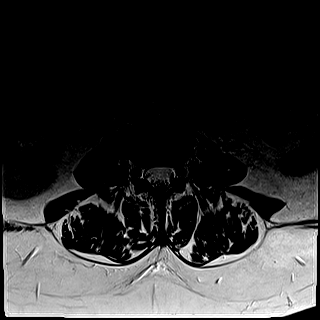
[im 26/45]
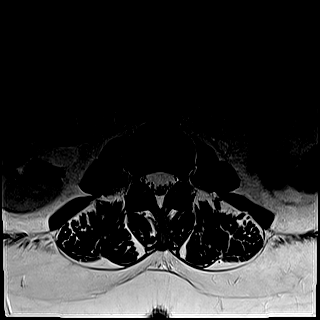
[im 32/45]
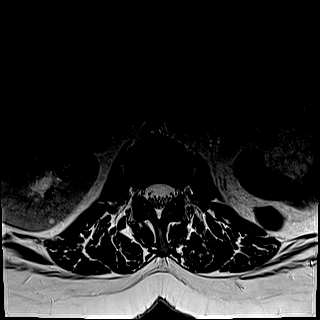
[im 38/45]
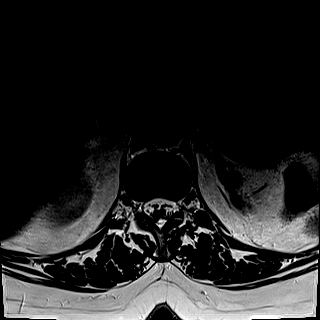
[im 45/45]
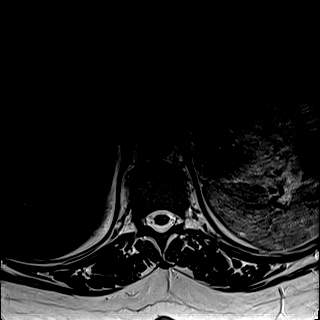

[Series 9: T1 · axial · 4.0mm · 0.39mm/px · z∈[-28,+192]mm · 9 of 45 slices shown (2 of 2)]
[im 1/45]
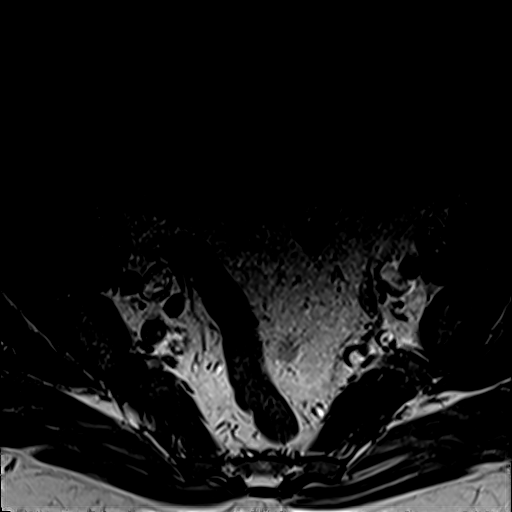
[im 7/45]
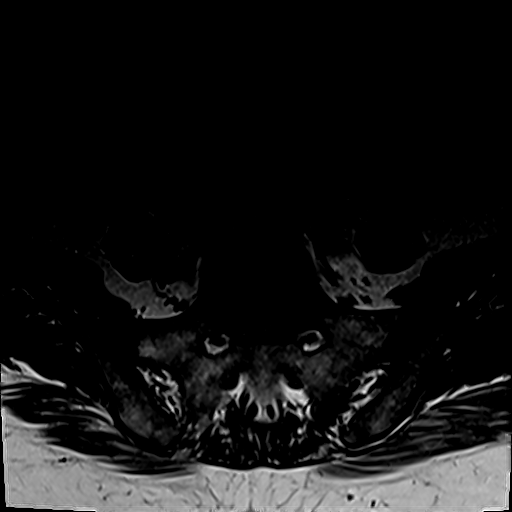
[im 13/45]
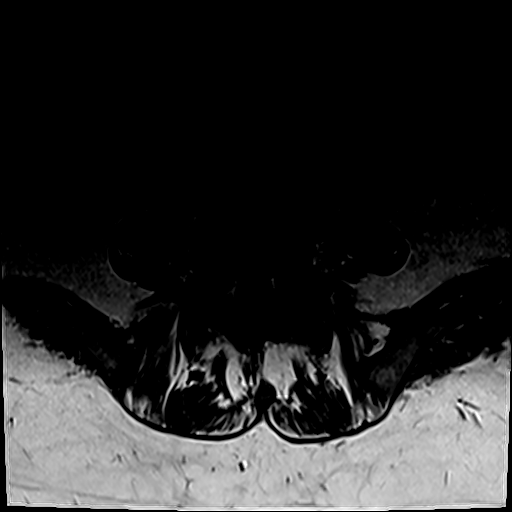
[im 19/45]
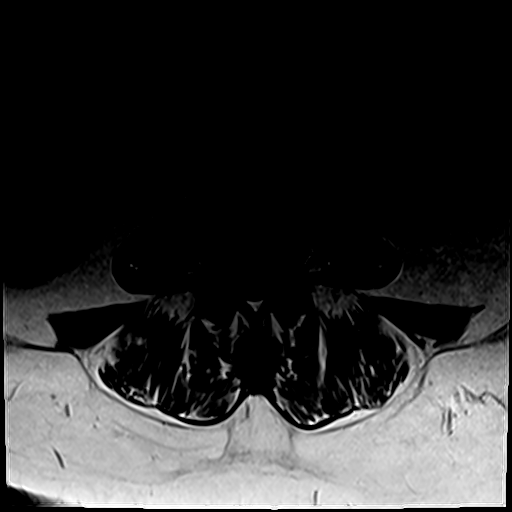
[im 23/45]
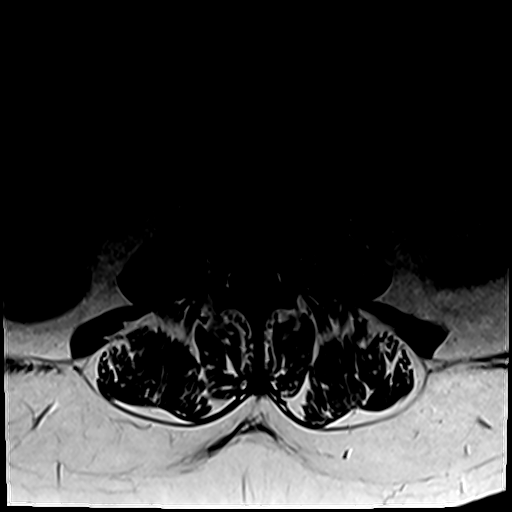
[im 26/45]
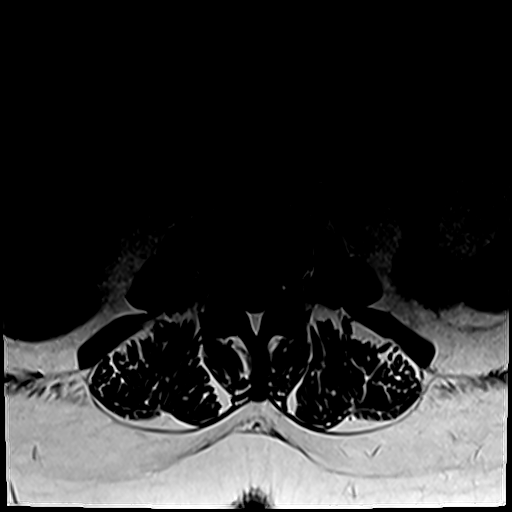
[im 32/45]
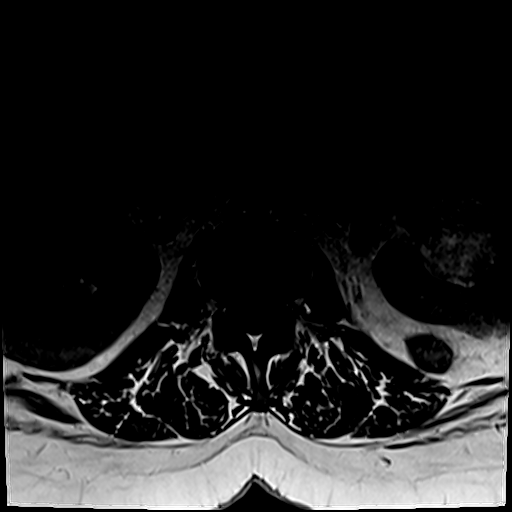
[im 38/45]
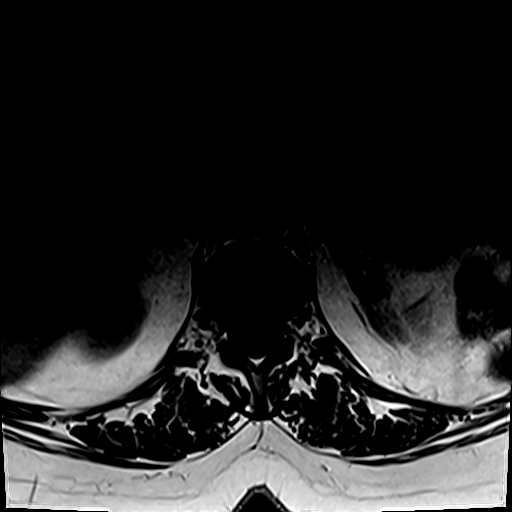
[im 45/45]
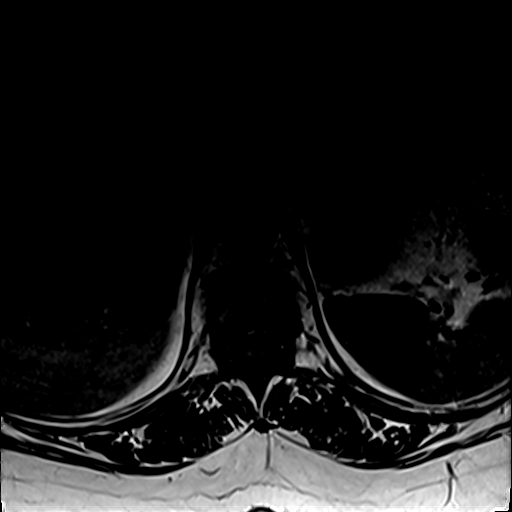

[31 of 48 positions shown; findings below may reference images not displayed]

FINDINGS: Segmentation: Standard segmentation is assumed. The inferior-most
fully formed intervertebral disc is labeled L5-S1.

Alignment:  No substantial sagittal subluxation.

Vertebrae: Vertebral body heights are maintained. No specific
evidence of acute fracture or discitis/osteomyelitis. No suspicious
bone lesions.

Conus medullaris and cauda equina: Conus extends to the L1-L2 level.
Conus appears normal.

Paraspinal and other soft tissues: Cholelithiasis.

Disc levels:

T12-L1: No significant disc protrusion, foraminal stenosis, or canal
stenosis.

L1-L2: No significant disc protrusion, foraminal stenosis, or canal
stenosis.

L2-L3: No significant disc protrusion, foraminal stenosis, or canal
stenosis.

L3-L4: Slight disc bulging without significant stenosis.

L4-L5: Slight disc bulging and bilateral facet arthropathy without
significant stenosis.

L5-S1: Mild disc bulging and moderate bilateral facet arthropathy
with mild bilateral foraminal stenosis.
IMPRESSION: 1. Lower lumbar degenerative change, greatest at L5-S1 where there
is moderate facet arthropathy and mild bilateral foraminal stenosis.
No significant canal stenosis.
2. Cholelithiasis.

## 2021-08-20 ENCOUNTER — Other Ambulatory Visit: Payer: Self-pay

## 2021-08-24 ENCOUNTER — Ambulatory Visit: Payer: Self-pay | Admitting: Urology

## 2021-08-24 ENCOUNTER — Ambulatory Visit (HOSPITAL_COMMUNITY)
Admission: RE | Admit: 2021-08-24 | Discharge: 2021-08-24 | Disposition: A | Discharge status: Home / Self Care | Payer: Self-pay | Source: Ambulatory Visit | Attending: Urology | Admitting: Urology

## 2021-08-24 DIAGNOSIS — C642 Malignant neoplasm of left kidney, except renal pelvis: Secondary | ICD-10-CM | POA: Insufficient documentation

## 2021-08-24 LAB — POCT I-STAT CREATININE: Creatinine, Ser: 0.7 mg/dL (ref 0.44–1.00)

## 2021-08-24 IMAGING — CT CT ABD-PELV W/ CM
2 of 5 series · 14 of 46 positions shown, 16 images · IV contrast (APPLIED)
Comparison: Preoperative abdominal MRI [DATE] and abdominal CT
[DATE].

CLINICAL DATA: History of left-sided renal cell carcinoma. Active
surveillance. * Tracking Code: BO *

EXAM:
CT ABDOMEN AND PELVIS WITH CONTRAST
TECHNIQUE: Multidetector CT imaging of the abdomen and pelvis was performed
using the standard protocol following bolus administration of
intravenous contrast.

[Series 2: axial st · axial · 0.86mm/px · z∈[-489,-59]mm · 11 of 104 slices shown, 13 images]
[im 9/104  soft-tissue]
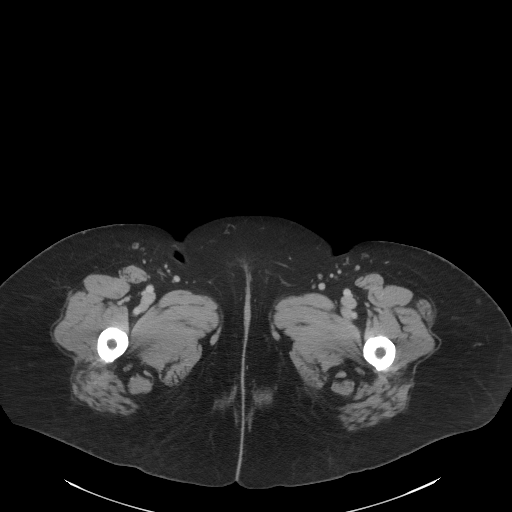
[im 9/104  bone]
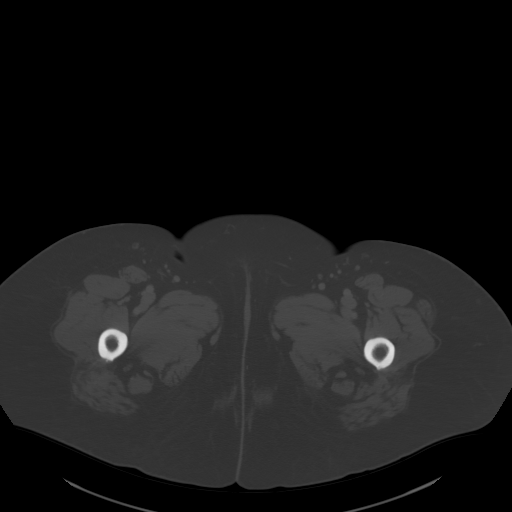
[im 18/104  soft-tissue]
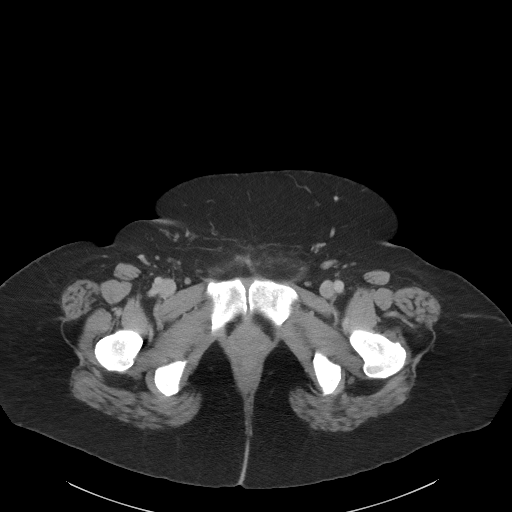
[im 26/104  soft-tissue]
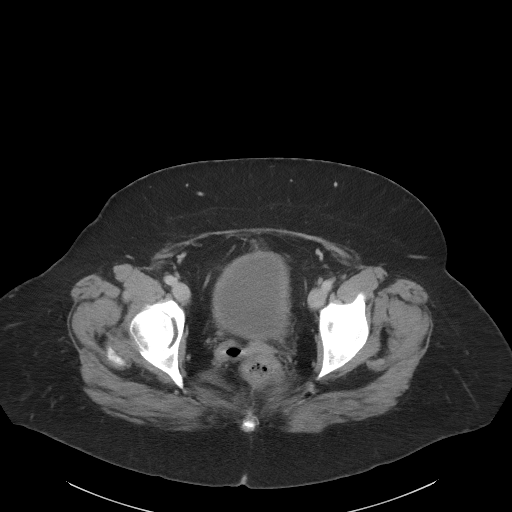
[im 35/104  soft-tissue]
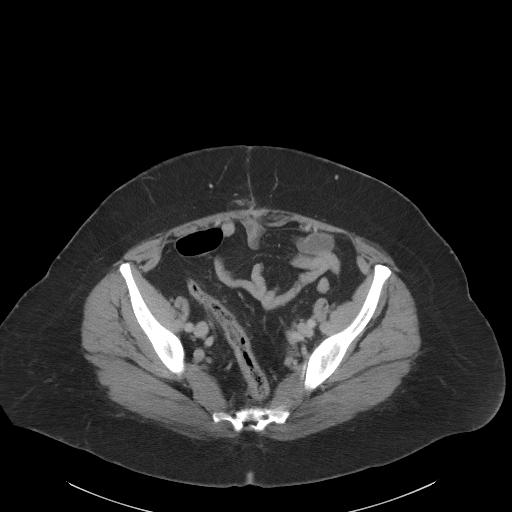
[im 43/104  soft-tissue]
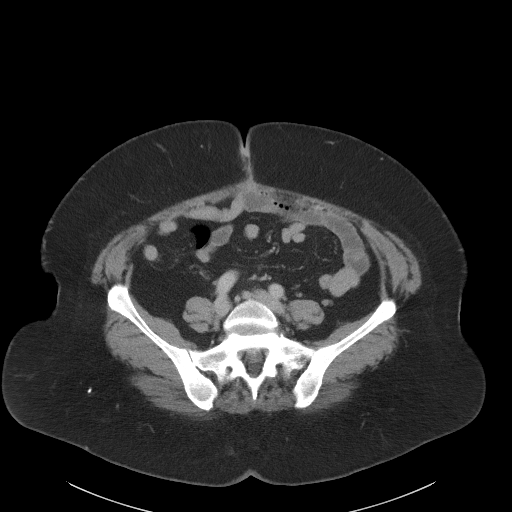
[im 52/104  soft-tissue]
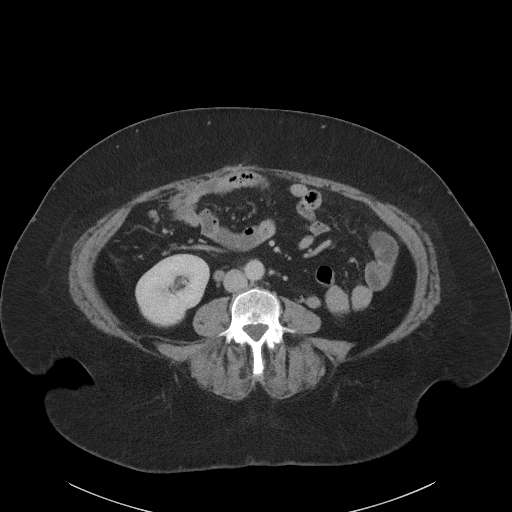
[im 61/104  soft-tissue]
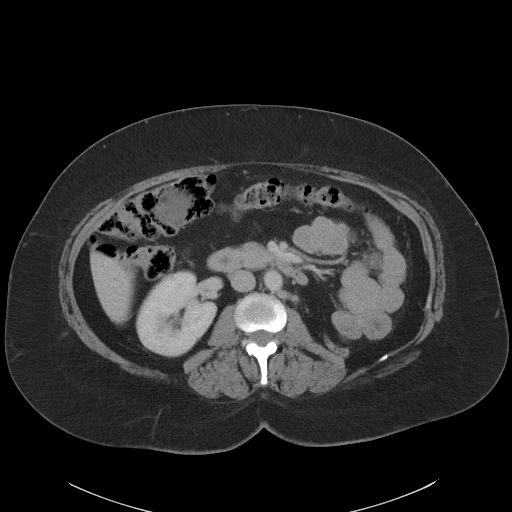
[im 69/104  soft-tissue]
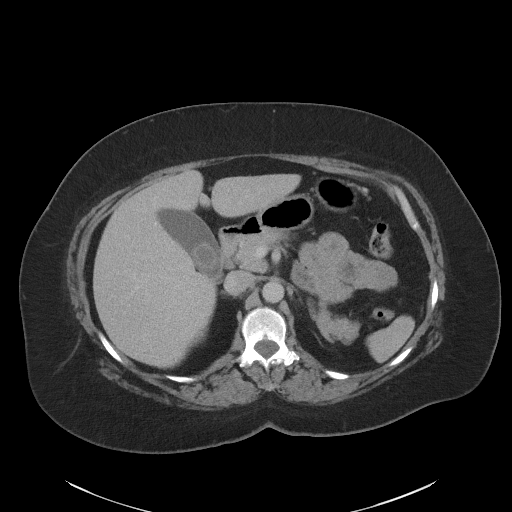
[im 78/104  soft-tissue]
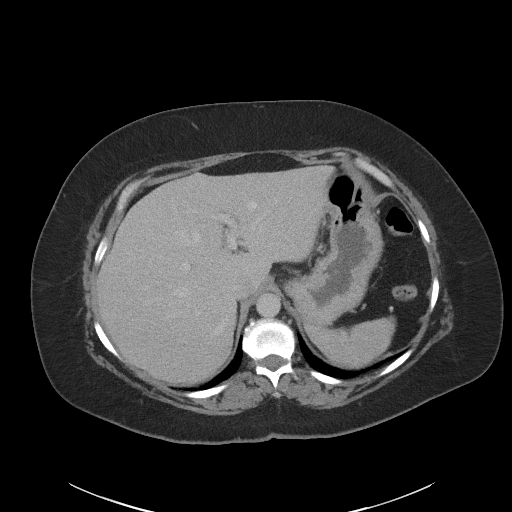
[im 78/104  bone]
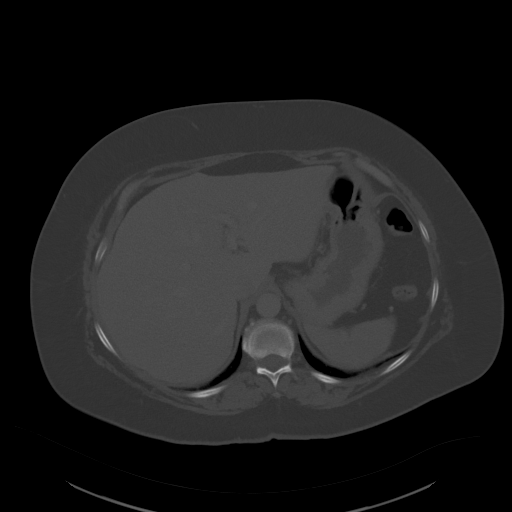
[im 86/104  soft-tissue]
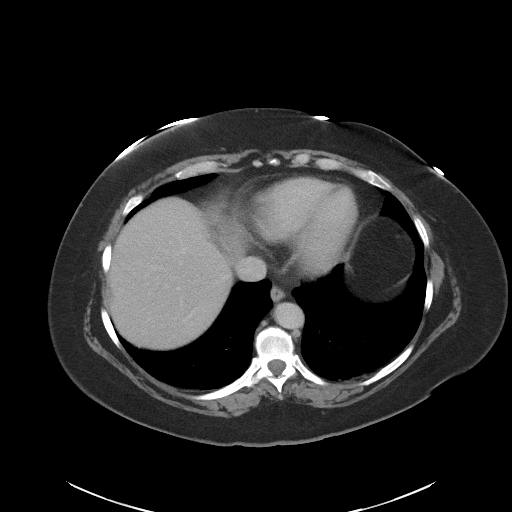
[im 95/104  soft-tissue]
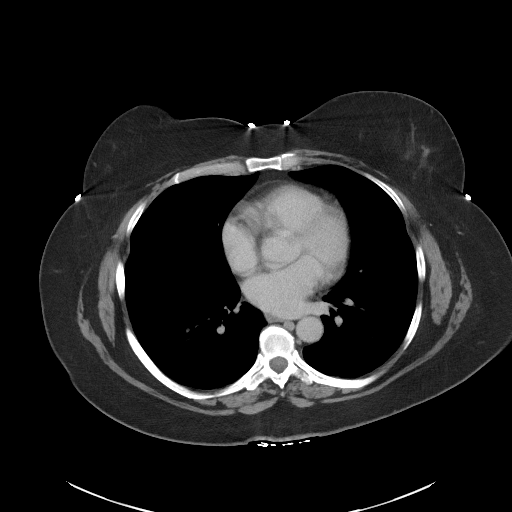

[Series 4: coronal st · coronal · 0.85mm/px · 3 of 102 slices shown]
[im 34/102  soft-tissue]
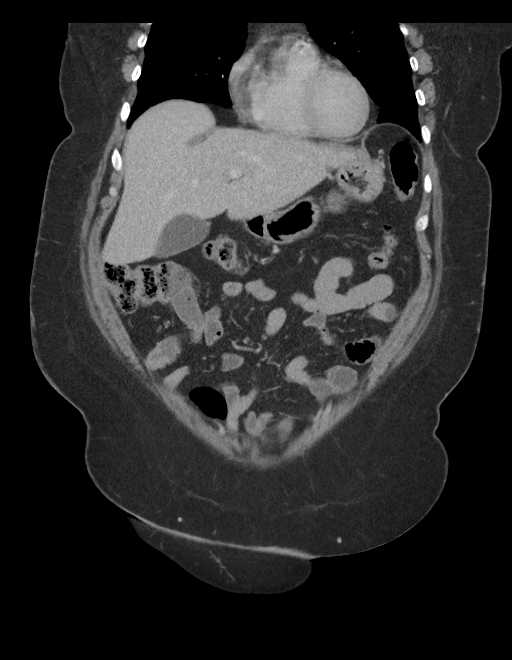
[im 45/102  soft-tissue]
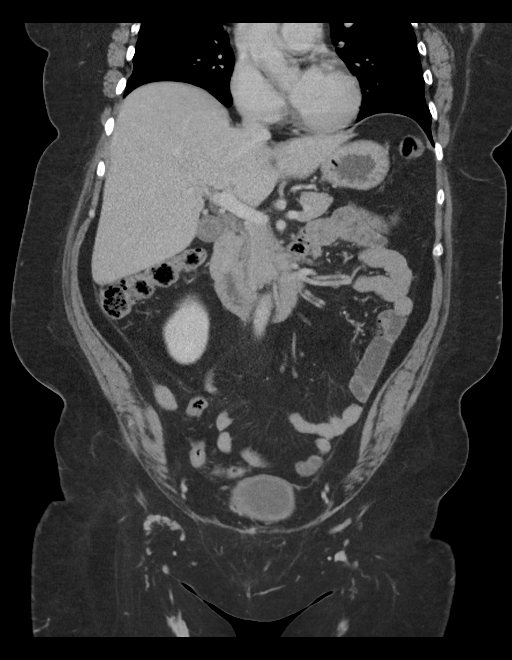
[im 57/102  soft-tissue]
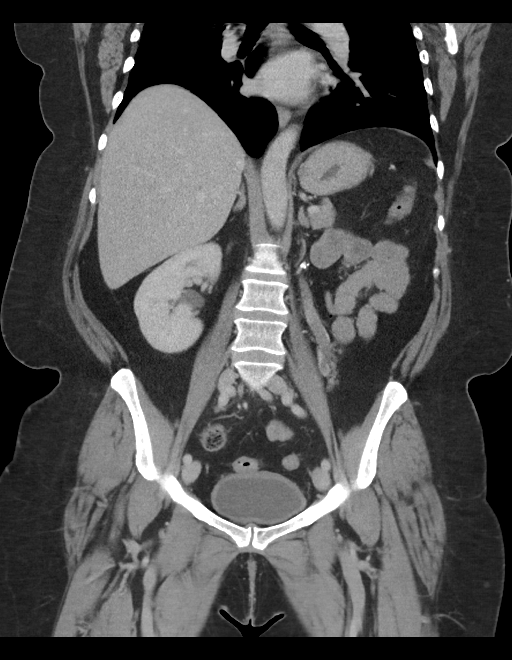

[14 of 46 positions shown; findings below may reference images not displayed]

RADIATION DOSE REDUCTION: This exam was performed according to the
departmental dose-optimization program which includes automated
exposure control, adjustment of the mA and/or kV according to
patient size and/or use of iterative reconstruction technique.

CONTRAST:  100mL OMNIPAQUE IOHEXOL 300 MG/ML  SOLN
FINDINGS: Lower chest: Clear lung bases. No significant pleural or pericardial
effusion.

Hepatobiliary: The liver is normal in density without suspicious
focal abnormality. A large noncalcified gallstone is again noted. No
evidence of gallbladder wall thickening, surrounding inflammation or
biliary dilatation.

Pancreas: Unremarkable. No pancreatic ductal dilatation or
surrounding inflammatory changes.

Spleen: Normal in size without focal abnormality.

Adrenals/Urinary Tract: Both adrenal glands appear normal. Interval
left nephrectomy. There is no mass in the nephrectomy bed. A portion
of the left gonadal vein remains in place. The right kidney appears
normal, without mass lesion, urinary tract calculus or
hydronephrosis. The bladder appears unremarkable for its degree of
distention.

Stomach/Bowel: No enteric contrast administered. The stomach appears
unremarkable for its degree of distension. No evidence of bowel wall
thickening, distention or surrounding inflammatory change.

Vascular/Lymphatic: There are no enlarged abdominal or pelvic lymph
nodes. No significant vascular findings.

Reproductive: Interval hysterectomy. Superior remnants of both
gonadal veins. No suspicious adnexal findings.

Other: Postsurgical changes in the anterior abdominal wall. No
ascites or peritoneal nodularity.

Musculoskeletal: No acute or significant osseous findings.
IMPRESSION: 1. Interval left nephrectomy and hysterectomy.
2. No evidence of local recurrence or metastatic disease.
3. The right kidney appears unremarkable.
4. Cholelithiasis without evidence of cholecystitis or biliary
dilatation.

## 2021-08-24 MED ORDER — IOHEXOL 300 MG/ML  SOLN
100.0000 mL | Freq: Once | INTRAMUSCULAR | Status: AC | PRN
  Administered 2021-08-24: 100 mL via INTRAVENOUS

## 2021-09-15 ENCOUNTER — Ambulatory Visit
Admission: EM | Admit: 2021-09-15 | Discharge: 2021-09-15 | Disposition: A | Discharge status: Home / Self Care | Payer: Self-pay | Attending: Family Medicine | Admitting: Family Medicine

## 2021-09-15 ENCOUNTER — Ambulatory Visit: Payer: Self-pay

## 2021-09-15 ENCOUNTER — Other Ambulatory Visit: Payer: Self-pay

## 2021-09-15 DIAGNOSIS — R197 Diarrhea, unspecified: Secondary | ICD-10-CM

## 2021-09-15 MED ORDER — ONDANSETRON 4 MG PO TBDP
4.0000 mg | ORAL_TABLET | Freq: Three times a day (TID) | ORAL | 0 refills | Status: DC | PRN
  Filled 2021-09-15: qty 10, 4d supply, fill #0

## 2021-09-15 NOTE — Discharge Instructions (Addendum)
Ondansetron dissolved in the mouth every 8 hours as needed for nausea or vomiting. Clear liquids and bland things to eat. (Ondansetron dissuelta en la boca cada 8 horas si tiene nausea o vomito)   We have drawn blood work for a blood count and sodium and potassium and kidney function numbers; staff will call you if anything is significantly abnormal (Hemos sacado sangre para anemia y para chequear niveles de [place], [place], y de funcion de rinones)

## 2021-09-15 NOTE — ED Triage Notes (Signed)
Pt c/o a female provider from hospital told to come to [facility]. States recently had a kidney removed (nov 2022). Now has diarrhea w/ pain in hands and feet. These sxs started last night.

## 2021-09-15 NOTE — ED Provider Notes (Addendum)
[facility]    CSN: [id number] Arrival date & time: 09/15/21  1330      History   Chief Complaint Chief Complaint  Patient presents with   situations    HPI Mariah Lang is a 51 y.o. female.   HPI Here for diarrhea that began last night.  She had about 8-10 episodes overnight between the hours of 10 PM and 3 AM this morning.  Then she had another 6-8 episodes this morning.  Last bowel movement was about 3 and half hours ago.  She did managed a little bit of lunch today.  Last night she tried to eat some supper and felt nauseated.  Otherwise nausea is not persistent.  No fever or chills.  No upper respiratory symptoms.   She did have a nephrectomy last November due to a tumor.  Her physician instructed her to come be seen at urgent care so that she does not get dehydrated.  She is having some aching in her forearms and her lower legs.  No abdominal pain or cramping.  No dysuria or hematuria.  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   Ankle edema, bilateral 09/14/2020   Vitamin D deficiency 09/14/2020   Thrombocytosis 05/11/2019   Language barrier 12/31/2018   Screening breast examination 12/04/2018   Fibroid uterus 12/18/2014   Menorrhagia, premenopausal 11/24/2014   Iron deficiency anemia 11/24/2014   Iron deficiency anemia due to chronic blood loss 09/02/2014   Pap smear for cervical cancer screening 09/02/2014   Annual physical exam 09/02/2014   Dysmenorrhea 02/15/2013   Anemia due to Dysmenorrhea 02/15/2013    Past Surgical History:  Procedure Laterality Date   APPENDECTOMY     CESAREAN SECTION  2000   KIDNEY SURGERY      LAPAROSCOPIC NEPHRECTOMY, HAND ASSISTED Left 02/17/2021   Procedure: HAND ASSISTED LAPAROSCOPIC RADICAL  NEPHRECTOMY;  Surgeon: Hollice Espy, MD;  Location: [facility];  Service: Urology;  Laterality: Left;   LAPAROTOMY  02/17/2021   Procedure: LAPAROTOMY OR HAND ASSIST;  Surgeon: Gillis Ends, MD;  Location: [facility];  Service: Gynecology;;  Fibroid excision    OTHER SURGICAL HISTORY  1993   c-section   PARTIAL HYSTERECTOMY     ROBOTIC ASSISTED LAPAROSCOPIC HYSTERECTOMY AND SALPINGECTOMY Bilateral 02/17/2021   Procedure: XI ROBOTIC ASSISTED LAPAROSCOPIC HYSTERECTOMY AND SALPINGECTOMY;  Surgeon: Gillis Ends, MD;  Location: [facility];  Service: Gynecology;  Laterality: Bilateral;    OB History     Gravida  2   Para  2   Term  2   Preterm  0   AB  0   Living         SAB  0   IAB  0   Ectopic  0   Multiple      Live Births               Home Medications    Prior to Admission medications   Medication Sig Start Date End Date Taking? Authorizing Provider  ondansetron (ZOFRAN-ODT) 4 MG disintegrating tablet Take 1 tablet (4 mg total) by mouth every 8 (eight) hours as needed for nausea or vomiting. 09/15/21  Yes  Barrett Henle, MD  methocarbamol (ROBAXIN) 500 MG tablet Take 1 tablet (500 mg total) by mouth every 8 (eight) hours as needed for muscle spasms. 06/28/21   Charlott Rakes, MD    Family History Family History  Problem Relation Age of Onset   Migraines Mother    Hypertension Mother    Migraines Father    Hypertension Father    Diabetes Mother    Diabetes Paternal Grandmother     Social History Social History   Tobacco Use   Smoking status: Never   Smokeless tobacco: Never  Vaping Use   Vaping Use: Never used  Substance Use Topics   Alcohol use: Not Currently   Drug use: Not Currently     Allergies   Patient has no known allergies.   Review of Systems Review of Systems   Physical Exam Triage Vital  Signs ED Triage Vitals  Enc Vitals Group     BP 09/15/21 1356 107/73     Pulse Rate 09/15/21 1356 73     Resp 09/15/21 1356 18     Temp 09/15/21 1356 98.3 F (36.8 C)     Temp Source 09/15/21 1356 Oral     SpO2 09/15/21 1356 96 %     Weight --      Height --      Head Circumference --      Peak Flow --      Pain Score 09/15/21 1354 0     Pain Loc --      Pain Edu? --      Excl. in [place]? --    No data found.  Updated Vital Signs BP 107/73 (BP Location: Left Arm)   Pulse 73   Temp 98.3 F (36.8 C) (Oral)   Resp 18   LMP 01/18/2019   SpO2 96%   Visual Acuity Right Eye Distance:   Left Eye Distance:   Bilateral Distance:    Right Eye Near:   Left Eye Near:    Bilateral Near:     Physical Exam Vitals reviewed.  HENT:     Nose: Nose normal.     Mouth/Throat:     Mouth: Mucous membranes are moist.  Eyes:     Extraocular Movements: Extraocular movements intact.     Pupils: Pupils are equal, round, and reactive to light.  Cardiovascular:     Rate and Rhythm: Normal rate and regular rhythm.     Heart sounds: No murmur heard. Pulmonary:     Effort: Pulmonary effort is normal.     Breath sounds: Normal breath sounds.  Abdominal:     General: Bowel sounds are normal. There is no distension.     Palpations: Abdomen is soft. There is no mass.     Tenderness: There is no abdominal tenderness. There is no guarding.  Musculoskeletal:     Right lower leg: No edema.     Left lower leg: No edema.  Skin:    Coloration: Skin is not jaundiced or pale.  Neurological:     General: No focal deficit present.     Mental Status: She is oriented to person, place, and time.  Psychiatric:        Behavior: Behavior normal.      UC Treatments / Results  Labs (all labs ordered are listed, but only abnormal results are displayed) Labs Reviewed  CBC  COMPREHENSIVE METABOLIC PANEL    EKG   Radiology No results found.  Procedures Procedures (including critical care  time)  Medications Ordered in UC Medications - No data to display  Initial Impression / Assessment and Plan / UC Course  I have reviewed the triage vital signs and the nursing notes.  Pertinent labs & imaging results that were available during my care of the patient were reviewed by me and considered in my medical decision making (see chart for details).     We will treat with some ondansetron, since it sounds like she is having a hard time getting much liquids in due to some very mild nausea.  Since she has not had any stooling in the last 3 hours, I am not going to treat her with any antidiarrheal meds medication  Since she is expressing a lot of concern about dehydration we will do some lab work for her Final Clinical Impressions(s) / UC Diagnoses   Final diagnoses:  Diarrhea, unspecified type     Discharge Instructions      Ondansetron dissolved in the mouth every 8 hours as needed for nausea or vomiting. Clear liquids and bland things to eat. (Ondansetron dissuelta en la boca cada 8 horas si tiene nausea o vomito)   We have drawn blood work for a blood count and sodium and potassium and kidney function numbers; staff will call you if anything is significantly abnormal (Hemos sacado sangre para anemia y para chequear niveles de [place], potasio, y de funcion de rinones)     ED Prescriptions     Medication Sig Dispense Auth. Provider   ondansetron (ZOFRAN-ODT) 4 MG disintegrating tablet Take 1 tablet (4 mg total) by mouth every 8 (eight) hours as needed for nausea or vomiting. 10 tablet Windy Carina Gwenlyn Perking, MD      PDMP not reviewed this encounter.   Barrett Henle, MD 09/15/21 1439    Barrett Henle, MD 09/15/21 [phone]

## 2021-09-15 NOTE — Telephone Encounter (Signed)
Interpreter [place] ID # [id number] Chief Complaint: dehydration Symptoms: diarrhea severe, feeling very weak and fatigue  Frequency: since yesterday Pertinent Negatives: Patient denies vomiting and abdominal pain Disposition: '[x]'$ ED /'[]'$ Urgent Care (no appt availability in office) / '[]'$ Appointment(In office/virtual)/ '[]'$  [facility]/ '[]'$ Home Care/ '[]'$ Refused Recommended Disposition /'[]'$ [place] [facility]/ '[]'$  Follow-up with PCP Additional Notes: pt states that her arms and legs hurt as well. She states she had a kidney removed in Nov and they advised her not to get dehydrated. Pt does feel like she is dehydrated d/t the amount of times she has had diarrhea. I advised her to go to ED to be evaluated. Pt asked if she could go to UC beside the hospital. I explained that she can try to go there but didn't feel like they would see her or do IV fluids if needed. Pt verbalized understanding.   Reason for Disposition  [1] Drinking very little AND [2] dehydration suspected (e.g., no urine > 12 hours, very dry mouth, very lightheaded)  Answer Assessment - Initial Assessment Questions 1. DIARRHEA SEVERITY: "How bad is the diarrhea?" "How many more stools have you had in the past 24 hours than normal?"    - NO DIARRHEA (SCALE 0)   - MILD (SCALE 1-3): Few loose or mushy BMs; increase of 1-3 stools over normal daily number of stools; mild increase in ostomy output.   -  MODERATE (SCALE 4-7): Increase of 4-6 stools daily over normal; moderate increase in ostomy output. * SEVERE (SCALE 8-10; OR 'WORST POSSIBLE'): Increase of 7 or more stools daily over normal; moderate increase in ostomy output; incontinence.     6 am to now, 6 times  2. ONSET: "When did the diarrhea begin?"      Yesterday  3. BM CONSISTENCY: "How loose or watery is the diarrhea?"      Loose and watery 4. VOMITING: "Are you also vomiting?" If Yes, ask: "How many times in the past 24 hours?"       no 5. ABDOMINAL PAIN: "Are you having any  abdominal pain?" If Yes, ask: "What does it feel like?" (e.g., crampy, dull, intermittent, constant)      no 8. HYDRATION: "Any signs of dehydration?" (e.g., dry mouth [not just dry lips], too weak to stand, dizziness, new weight loss) "When did you last urinate?"     Feeling very tired and weak  11. OTHER SYMPTOMS: "Do you have any other symptoms?" (e.g., fever, blood in stool)       Arm and leg pain  Protocols used: Diarrhea-A-AH

## 2021-09-16 ENCOUNTER — Other Ambulatory Visit: Payer: Self-pay

## 2021-09-16 ENCOUNTER — Encounter: Payer: Self-pay | Admitting: Specialist

## 2021-09-16 ENCOUNTER — Ambulatory Visit (INDEPENDENT_AMBULATORY_CARE_PROVIDER_SITE_OTHER): Payer: Self-pay | Admitting: Specialist

## 2021-09-16 VITALS — BP 113/77 | HR 66 | Ht 59.0 in | Wt 200.0 lb

## 2021-09-16 DIAGNOSIS — M545 Low back pain, unspecified: Secondary | ICD-10-CM

## 2021-09-16 DIAGNOSIS — M5136 Other intervertebral disc degeneration, lumbar region: Secondary | ICD-10-CM

## 2021-09-16 DIAGNOSIS — M4316 Spondylolisthesis, lumbar region: Secondary | ICD-10-CM

## 2021-09-16 LAB — CBC
Hematocrit: 38.5 % (ref 34.0–46.6)
Hemoglobin: 12.7 g/dL (ref 11.1–15.9)
MCH: 27.1 pg (ref 26.6–33.0)
MCHC: 33 g/dL (ref 31.5–35.7)
MCV: 82 fL (ref 79–97)
Platelets: 427 x10E3/uL (ref 150–450)
RBC: 4.69 x10E6/uL (ref 3.77–5.28)
RDW: 14.4 % (ref 11.7–15.4)
WBC: 8.2 x10E3/uL (ref 3.4–10.8)

## 2021-09-16 LAB — COMPREHENSIVE METABOLIC PANEL
ALT: 33 IU/L — ABNORMAL HIGH (ref 0–32)
AST: 30 IU/L (ref 0–40)
Albumin/Globulin Ratio: 1.3 (ref 1.2–2.2)
Albumin: 4.1 g/dL (ref 3.8–4.8)
Alkaline Phosphatase: 79 IU/L (ref 44–121)
BUN/Creatinine Ratio: 12 (ref 9–23)
BUN: 12 mg/dL (ref 6–24)
Bilirubin Total: 0.3 mg/dL (ref 0.0–1.2)
CO2: 21 mmol/L (ref 20–29)
Calcium: 9 mg/dL (ref 8.7–10.2)
Chloride: 104 mmol/L (ref 96–106)
Creatinine, Ser: 1.01 mg/dL — ABNORMAL HIGH (ref 0.57–1.00)
Globulin, Total: 3.2 g/dL (ref 1.5–4.5)
Glucose: 89 mg/dL (ref 70–99)
Potassium: 4.2 mmol/L (ref 3.5–5.2)
Sodium: 139 mmol/L (ref 134–144)
Total Protein: 7.3 g/dL (ref 6.0–8.5)
eGFR: 68 mL/min/{1.73_m2} (ref >59)

## 2021-09-16 MED ORDER — DULOXETINE HCL 20 MG PO CPEP
20.0000 mg | ORAL_CAPSULE | Freq: Every day | ORAL | 3 refills | Status: DC
  Filled 2021-09-16: qty 30, 30d supply, fill #0
  Filled 2021-10-13: qty 30, 30d supply, fill #1

## 2021-09-16 NOTE — Patient Instructions (Addendum)
Avoid frequent bending and stooping  No lifting greater than Avoid frequent bending and stooping  No lifting greater than 10 lbs. May use ice or moist heat for pain. Weight loss is of benefit. Best medication for lumbar disc disease is arthritis medications but since you have only one kidney you should not take these medications NO NSAIDs (no motrin, no alleve )  Exercise is important to improve your indurance and does allow people to function better inspite of back pain.  Cymbalta for arthritis pain.  lbs. May use ice or moist heat for pain. Weight loss is of benefit. Best medication for lumbar disc disease is arthritis medications but since you have only one kidney you should not take these medications NO NSAIDs (no motrin, no alleve )  Exercise is important to improve your indurance and does allow people to function better inspite of back pain.  Cymbalta for arthritis pain.

## 2021-09-16 NOTE — Progress Notes (Signed)
Office Visit Note   Patient: Mariah Lang           Date of Birth: May 17, 1970           MRN: [id number] Visit Date: 09/16/2021              Requested by: No referring provider defined for this encounter. PCP: System, Provider Not In   Assessment & Plan: Visit Diagnoses:  1. Spondylolisthesis, lumbar region   2. Acute bilateral low back pain, unspecified whether sciatica present   3. Degenerative disc disease, lumbar     Plan:Avoid frequent bending and stooping  No lifting greater than 25-35 lbs. May use ice or moist heat for pain. Weight loss is of benefit. Best medication for lumbar disc disease is arthritis medications but since you have only one kidney you should not take these medications NO NSAIDs (no motrin, no alleve )  Exercise is important to improve your indurance and does allow people to function better inspite of back pain.  Cymbalta for arthritis pain.   Follow-Up Instructions: No follow-ups on file.   Orders:  No orders of the defined types were placed in this encounter.  No orders of the defined types were placed in this encounter.     Procedures: No procedures performed   Clinical Data: No additional findings.   Subjective: Chief Complaint  Patient presents with   Lower Back - Follow-up   MRI Review    51 year old female with history of back and buttock pain since February Surgery the pain has been getting worse. She underwent nephrectomy surgery in 02/2021 and then returned to work. She relates that she returned to work in February and then the pain returned with a pinching pain that radiates down the leftspine. Feels like a kicking feeling in the back . Has pain with prolong sitting and bending and stooping and lifting. She is working 2 jobs and [place] home in good. She has taken Steroids from the doctor but then stopped.    Review of Systems  Constitutional: Negative.   HENT: Negative.    Eyes: Negative.    Respiratory: Negative.    Cardiovascular: Negative.   Gastrointestinal: Negative.   Endocrine: Negative.   Genitourinary: Negative.   Musculoskeletal: Negative.   Skin: Negative.   Allergic/Immunologic: Negative.   Neurological: Negative.   Hematological: Negative.   Psychiatric/Behavioral: Negative.       Objective: Vital Signs: BP 113/77   Pulse 66   Ht '4\' 11"'$  (1.499 m)   Wt 200 lb (90.7 kg)   LMP 01/18/2019   BMI 40.40 kg/m   Physical Exam Constitutional:      Appearance: She is well-developed.  HENT:     Head: Normocephalic and atraumatic.  Eyes:     Pupils: Pupils are equal, round, and reactive to light.  Pulmonary:     Effort: Pulmonary effort is normal.     Breath sounds: Normal breath sounds.  Abdominal:     General: Bowel sounds are normal.     Palpations: Abdomen is soft.  Musculoskeletal:        General: Normal range of motion.     Cervical back: Normal range of motion and neck supple.  Skin:    General: Skin is warm and dry.  Neurological:     Mental Status: She is alert and oriented to person, place, and time.  Psychiatric:        Behavior: Behavior normal.  Thought Content: Thought content normal.        Judgment: Judgment normal.     Ortho Exam  Specialty Comments:  No specialty comments available.  Imaging: No results found.   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/2021   Ankle edema, bilateral 09/14/2020   Vitamin D deficiency 09/14/2020   Thrombocytosis 05/11/2019   Language barrier 12/31/2018   Screening breast examination 12/04/2018   Fibroid uterus 12/18/2014   Menorrhagia, premenopausal 11/24/2014   Iron deficiency anemia 11/24/2014   Iron deficiency  anemia due to chronic blood loss 09/02/2014   Pap smear for cervical cancer screening 09/02/2014   Annual physical exam 09/02/2014   Dysmenorrhea 02/15/2013   Anemia due to Dysmenorrhea 02/15/2013   Past Medical History:  Diagnosis Date   Anemia     Family History  Problem Relation Age of Onset   Migraines Mother    Hypertension Mother    Migraines Father    Hypertension Father    Diabetes Mother    Diabetes Paternal Grandmother     Past Surgical History:  Procedure Laterality Date   APPENDECTOMY     CESAREAN SECTION  2000   KIDNEY SURGERY     LAPAROSCOPIC NEPHRECTOMY, HAND ASSISTED Left 02/17/2021   Procedure: HAND ASSISTED LAPAROSCOPIC RADICAL  NEPHRECTOMY;  Surgeon: Hollice Espy, MD;  Location: [facility];  Service: Urology;  Laterality: Left;   LAPAROTOMY  02/17/2021   Procedure: LAPAROTOMY OR HAND ASSIST;  Surgeon: Gillis Ends, MD;  Location: [facility];  Service: Gynecology;;  Fibroid excision    OTHER SURGICAL HISTORY  1993   c-section   PARTIAL HYSTERECTOMY     ROBOTIC ASSISTED LAPAROSCOPIC HYSTERECTOMY AND SALPINGECTOMY Bilateral 02/17/2021   Procedure: XI ROBOTIC ASSISTED LAPAROSCOPIC HYSTERECTOMY AND SALPINGECTOMY;  Surgeon: Gillis Ends, MD;  Location: [facility];  Service: Gynecology;  Laterality: Bilateral;   Social History   Occupational History   Not on file  Tobacco Use   Smoking status: Never   Smokeless tobacco: Never  Vaping Use   Vaping Use: Never used  Substance and Sexual Activity   Alcohol use: Not Currently   Drug use: Not Currently   Sexual activity: Yes    Birth control/protection: Surgical, None

## 2021-09-22 ENCOUNTER — Ambulatory Visit
Admission: RE | Admit: 2021-09-22 | Discharge: 2021-09-22 | Disposition: A | Discharge status: Home / Self Care | Payer: Self-pay | Source: Ambulatory Visit | Attending: Urology | Admitting: Urology

## 2021-09-22 ENCOUNTER — Ambulatory Visit (INDEPENDENT_AMBULATORY_CARE_PROVIDER_SITE_OTHER): Payer: Self-pay | Admitting: Urology

## 2021-09-22 ENCOUNTER — Encounter: Payer: Self-pay | Admitting: Urology

## 2021-09-22 ENCOUNTER — Ambulatory Visit
Admission: RE | Admit: 2021-09-22 | Discharge: 2021-09-22 | Disposition: A | Discharge status: Home / Self Care | Payer: Self-pay | Attending: Urology | Admitting: Urology

## 2021-09-22 VITALS — BP 137/82 | HR 78 | Ht 59.06 in | Wt 202.0 lb

## 2021-09-22 DIAGNOSIS — C642 Malignant neoplasm of left kidney, except renal pelvis: Secondary | ICD-10-CM | POA: Insufficient documentation

## 2021-09-22 DIAGNOSIS — Z85528 Personal history of other malignant neoplasm of kidney: Secondary | ICD-10-CM

## 2021-09-22 IMAGING — CR DG CHEST 1V
1 series · 1 of 1 positions shown · non-contrast
Comparison: [DATE]

CLINICAL DATA: Renal cell carcinoma

EXAM:
CHEST  1 VIEW

[dg chest 1 view]
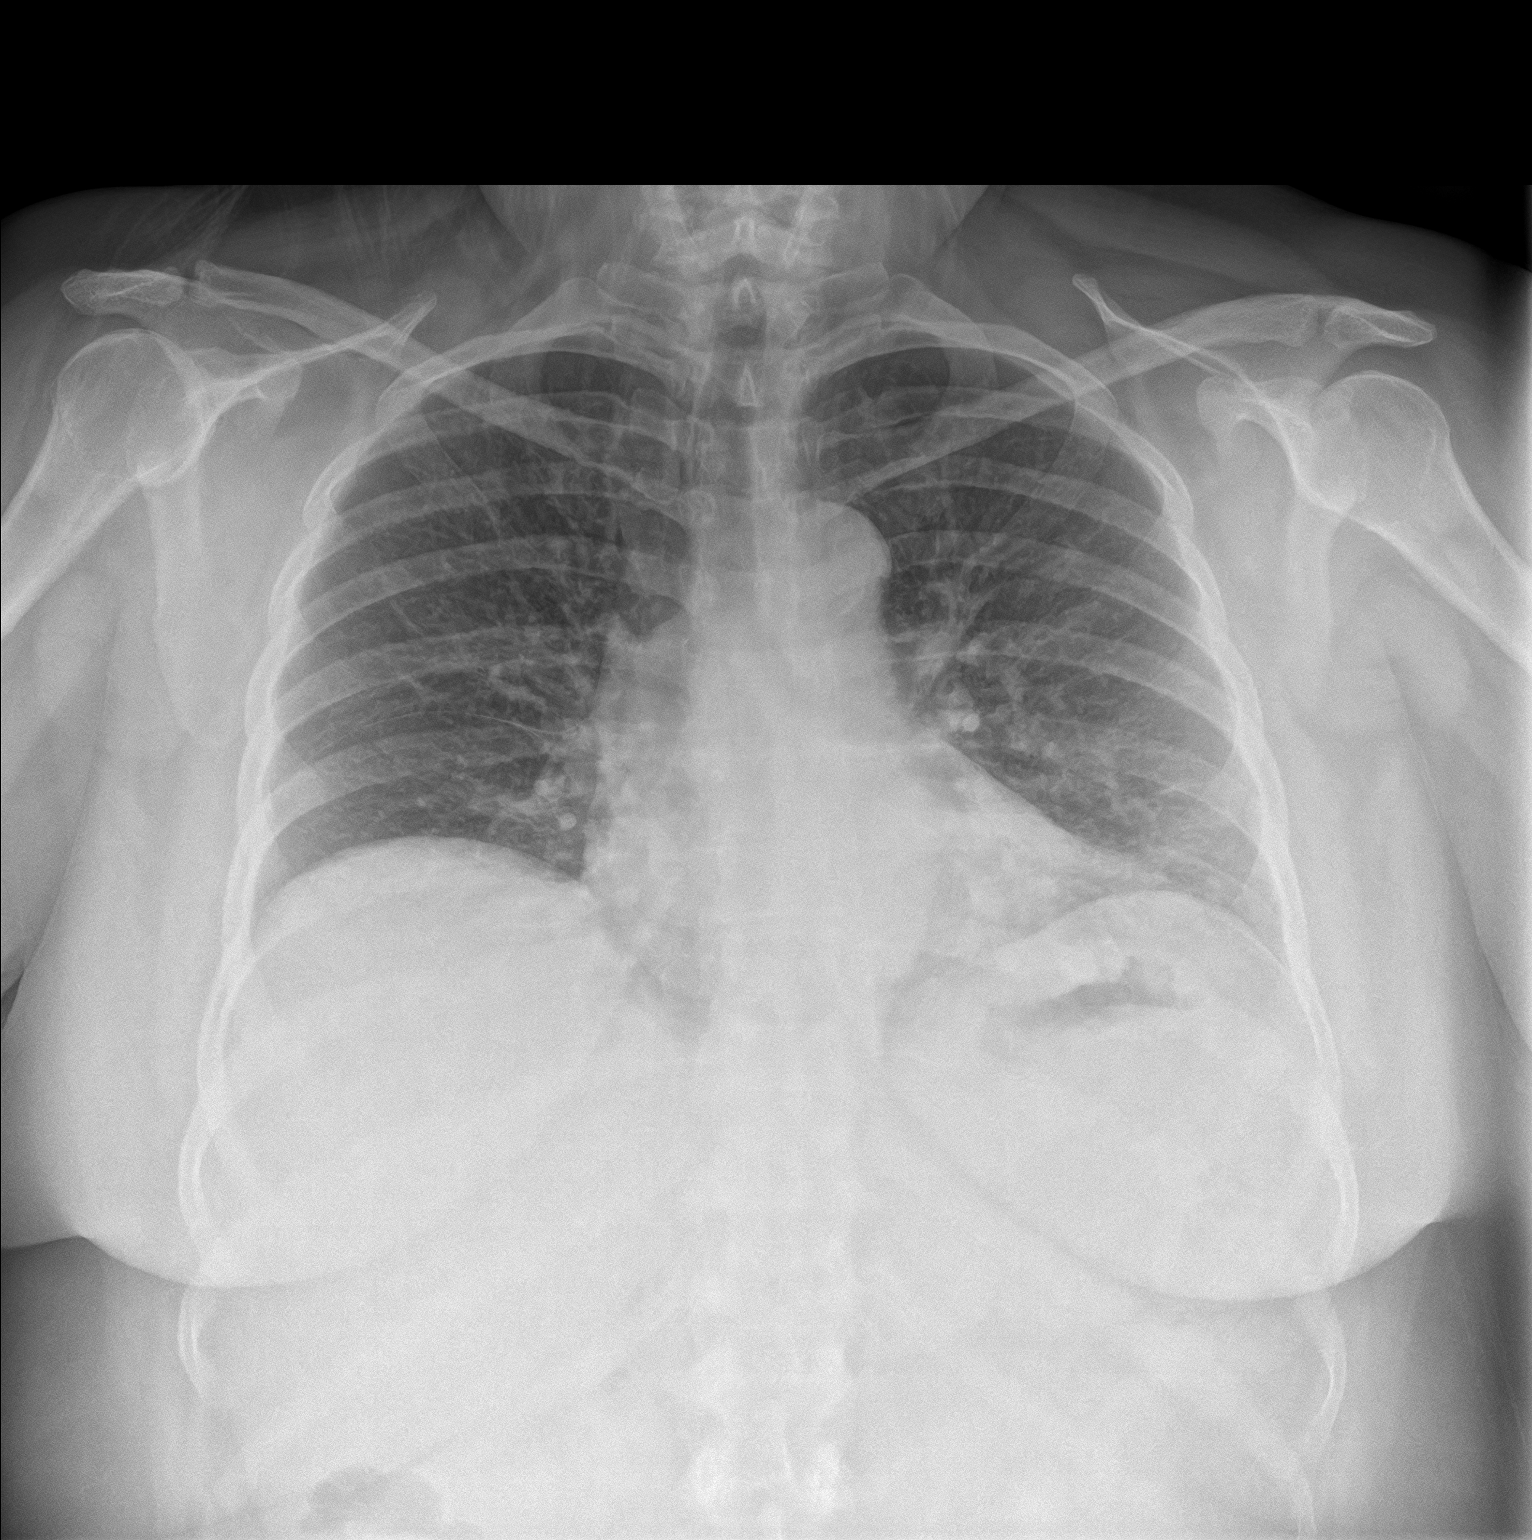

[1 of 1 positions shown; findings below may reference images not displayed]

FINDINGS: The heart size and mediastinal contours are within normal limits.
Both lungs are clear. The visualized skeletal structures are
unremarkable.
IMPRESSION: No active disease.

## 2021-09-22 NOTE — Patient Instructions (Signed)
Chest X-Ray today

## 2021-09-22 NOTE — Progress Notes (Signed)
09/22/21 2:29 PM   Mariah Lang 1970-10-09 [id number]  Referring provider:  No referring provider defined for this encounter. 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 She is accompanied by translator today.  12/18/2020 MRI of abdomen revealed 10.3 cm solid enhancing mass arising from the anterior mid pole of the left kidney, high suspicious for renal cell carcinoma, no evidence of abdominal metastatic disease, and large uterine fibroid enhancing the left abdomen.    CT of chest showed no evidence of metastatic disease or other significant abnormality within the thorax.    She is s/p hand-assisted laparoscopic left radical nephrectomy with me and total hysterectomy, minilaparotomy, myomectomy with Dr.Secord on 02/17/2021. 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    Post op course complicated by nonobstructing PE currently on eliquis x 3 months.  He underwent a CT abdomen and pelvis on 08/24/2021 that visualized local recurrence or metastatic disease.   She had Post op issues with puffiness and puckering at incision site which she feels has subsided.  She has no surgical issues.. She is seeing orthopedics for continued issues with her back and sciatica.Marland Kitchen    PMH: Past Medical History:  Diagnosis Date   Anemia     Surgical History: Past Surgical History:  Procedure Laterality Date   APPENDECTOMY     CESAREAN SECTION  2000   KIDNEY SURGERY     LAPAROSCOPIC NEPHRECTOMY, HAND ASSISTED Left 02/17/2021   Procedure: HAND ASSISTED LAPAROSCOPIC RADICAL  NEPHRECTOMY;  Surgeon: Hollice Espy, MD;  Location: [facility];  Service: Urology;  Laterality: Left;   LAPAROTOMY  02/17/2021   Procedure: LAPAROTOMY OR HAND  ASSIST;  Surgeon: Gillis Ends, MD;  Location: [facility];  Service: Gynecology;;  Fibroid excision    OTHER SURGICAL HISTORY  1993   c-section   PARTIAL HYSTERECTOMY     ROBOTIC ASSISTED LAPAROSCOPIC HYSTERECTOMY AND SALPINGECTOMY Bilateral 02/17/2021   Procedure: XI ROBOTIC ASSISTED LAPAROSCOPIC HYSTERECTOMY AND SALPINGECTOMY;  Surgeon: Gillis Ends, MD;  Location: [facility];  Service: Gynecology;  Laterality: Bilateral;    Home Medications:  Allergies as of 09/22/2021   No Known Allergies      Medication List        Accurate as of September 22, 2021 11:59 PM. If you have any questions, ask your nurse or doctor.          DULoxetine 20 MG capsule Commonly known as: Cymbalta Tome 1 cpsula (20 mg en total) por va oral diariamente. (Take 1 capsule (20 mg total) by mouth daily.)   methocarbamol 500 MG tablet Commonly known as: ROBAXIN Take 1 tablet (500 mg total) by mouth every 8 (eight) hours as needed for muscle spasms.   ondansetron 4 MG disintegrating tablet Commonly known as: ZOFRAN-ODT Dissolve 1 tablet (4 mg total) by mouth once every 8 (eight) hours as needed for nausea or vomiting.        Allergies:  No Known Allergies  Family History: Family History  Problem Relation Age of Onset   Migraines Mother    Hypertension Mother    Migraines Father    Hypertension Father    Diabetes Mother    Diabetes Paternal Grandmother     Social History:  reports that  she has never smoked. She has never used smokeless tobacco. She reports that she does not currently use alcohol. She reports that she does not currently use drugs.   Physical Exam: BP 137/82   Pulse 78   Ht 4' 11.06" (1.5 m)   Wt 202 lb (91.6 kg)   LMP 01/18/2019   BMI 40.72 kg/m   Constitutional:  Alert and oriented, No acute distress. HEENT: [place] AT, moist mucus membranes.  Trachea midline, no masses. Cardiovascular: No clubbing, cyanosis, or edema. Respiratory: Normal respiratory  effort, no increased work of breathing Abdomen: Incisions well-healed without hernia. Skin: No rashes, bruises or suspicious lesions. Neurologic: Grossly intact, no focal deficits, moving all 4 extremities. Psychiatric: Normal mood and affect.  Laboratory Data:  Lab Results  Component Value Date   CREATININE 1.01 (H) 09/15/2021   Lab Results  Component Value Date   HGBA1C 6.1 06/28/2021    Pertinent Imaging: EXAM: CT ABDOMEN AND PELVIS WITH CONTRAST   TECHNIQUE: Multidetector CT imaging of the abdomen and pelvis was performed using the standard protocol following bolus administration of intravenous contrast.   RADIATION DOSE REDUCTION: This exam was performed according to the departmental dose-optimization program which includes automated exposure control, adjustment of the mA and/or kV according to patient size and/or use of iterative reconstruction technique.   CONTRAST:  161m OMNIPAQUE IOHEXOL 300 MG/ML  SOLN   COMPARISON:  Preoperative abdominal MRI 12/18/2020 and abdominal CT 11/16/2020.   FINDINGS: Lower chest: Clear lung bases. No significant pleural or pericardial effusion.   Hepatobiliary: The liver is normal in density without suspicious focal abnormality. A large noncalcified gallstone is again noted. No evidence of gallbladder wall thickening, surrounding inflammation or biliary dilatation.   Pancreas: Unremarkable. No pancreatic ductal dilatation or surrounding inflammatory changes.   Spleen: Normal in size without focal abnormality.   Adrenals/Urinary Tract: Both adrenal glands appear normal. Interval left nephrectomy. There is no mass in the nephrectomy bed. A portion of the left gonadal vein remains in place. The right kidney appears normal, without mass lesion, urinary tract calculus or hydronephrosis. The bladder appears unremarkable for its degree of distention.   Stomach/Bowel: No enteric contrast administered. The stomach  appears unremarkable for its degree of distension. No evidence of bowel wall thickening, distention or surrounding inflammatory change.   Vascular/Lymphatic: There are no enlarged abdominal or pelvic lymph nodes. No significant vascular findings.   Reproductive: Interval hysterectomy. Superior remnants of both gonadal veins. No suspicious adnexal findings.   Other: Postsurgical changes in the anterior abdominal wall. No ascites or peritoneal nodularity.   Musculoskeletal: No acute or significant osseous findings.   IMPRESSION: 1. Interval left nephrectomy and hysterectomy. 2. No evidence of local recurrence or metastatic disease. 3. The right kidney appears unremarkable. 4. Cholelithiasis without evidence of cholecystitis or biliary dilatation.     Electronically Signed   By: WRichardean SaleM.D.   On: 08/26/2021 13:40  CT scan images personally reviewed, agree with radiologic interpretation.  No evidence of disease.  Assessment & Plan:    Chromophobe Renal cell carcinoma   - S/p hand-assisted laparoscopic left radical nephrectomy -No evidence of disease, stage III -We will continue to need continue 680-monthnterval CT abdomen for total of 3 years and then annually as well as chest imaging -Chest x-ray today  Return in about 6 months (around 03/24/2022) for 66m25mollow up w/CTscan and Chest X-ray prior.  I,AConley Rolls a scrEducation administratorr AshHollice EspyD.,have documented all relevant documentation on the  behalf of Hollice Espy, MD,as directed by  Hollice Espy, MD while in the presence of Hollice Espy, MD.  I have reviewed the above documentation for accuracy and completeness, and I agree with the above.   Hollice Espy, MD    [facility] [address] [place],  [address] [phone]

## 2021-09-23 ENCOUNTER — Telehealth: Payer: Self-pay

## 2021-09-23 ENCOUNTER — Other Ambulatory Visit: Payer: Self-pay

## 2021-09-23 NOTE — Telephone Encounter (Signed)
-----   Message from Hollice Espy, MD sent at 09/22/2021  5:08 PM EDT ----- Chest x-ray is normal  Hollice Espy, MD

## 2021-10-01 NOTE — Therapy (Signed)
OUTPATIENT PHYSICAL THERAPY THORACOLUMBAR EVALUATION   Patient Name: Mariah Lang MRN: [id number] DOB:1971-01-09, 51 y.o., female Today's Date: 10/01/2021    Past Medical History:  Diagnosis Date   Anemia    Past Surgical History:  Procedure Laterality Date   APPENDECTOMY     CESAREAN SECTION  2000   KIDNEY SURGERY     LAPAROSCOPIC NEPHRECTOMY, HAND ASSISTED Left 02/17/2021   Procedure: HAND ASSISTED LAPAROSCOPIC RADICAL  NEPHRECTOMY;  Surgeon: Hollice Espy, MD;  Location: [facility];  Service: Urology;  Laterality: Left;   LAPAROTOMY  02/17/2021   Procedure: LAPAROTOMY OR HAND ASSIST;  Surgeon: Gillis Ends, MD;  Location: [facility];  Service: Gynecology;;  Fibroid excision    OTHER SURGICAL HISTORY  1993   c-section   PARTIAL HYSTERECTOMY     ROBOTIC ASSISTED LAPAROSCOPIC HYSTERECTOMY AND SALPINGECTOMY Bilateral 02/17/2021   Procedure: XI ROBOTIC ASSISTED LAPAROSCOPIC HYSTERECTOMY AND SALPINGECTOMY;  Surgeon: Gillis Ends, MD;  Location: [facility];  Service: Gynecology;  Laterality: Bilateral;   Patient Active Problem List   Diagnosis Date Noted   Hyponatremia 02/20/2021   Aspiration pneumonia of both lower lobes due to gastric secretions ([place]) 02/19/2021   Pulmonary embolism ([place]) 02/19/2021   Acute hypoxemic respiratory failure ([place]) 02/19/2021   Renal cell carcinoma ([place]) 02/17/2021   Ankle edema, bilateral 09/14/2020   Vitamin D deficiency 09/14/2020   Thrombocytosis 05/11/2019   Language barrier 12/31/2018   Screening breast examination 12/04/2018   Fibroid uterus 12/18/2014   Menorrhagia, premenopausal 11/24/2014   Iron deficiency anemia 11/24/2014   Iron deficiency anemia due to chronic blood loss 09/02/2014   Pap smear for cervical cancer screening 09/02/2014   Annual physical exam 09/02/2014   Dysmenorrhea 02/15/2013   Anemia due to Dysmenorrhea 02/15/2013    PCP: Charlott Rakes, MD  REFERRING PROVIDER: Jessy Oto, MD  REFERRING DIAG:  Spondylolisthesis, lumbar region Acute bilateral low back pain Degenerative disc disease  Rationale for Evaluation and Treatment Rehabilitation  THERAPY DIAG:  No diagnosis found.  ONSET DATE: ***   SUBJECTIVE:       SUBJECTIVE STATEMENT: ***  PERTINENT HISTORY:  ***  PAIN:  Are you having pain? Yes:  NPRS scale: ***/10 Pain location: *** Pain description: *** Aggravating factors: *** Relieving factors: ***   PRECAUTIONS: None  WEIGHT BEARING RESTRICTIONS No  FALLS:  Has patient fallen in last 6 months? No  LIVING ENVIRONMENT: Lives with: {[facility] lives with:25569::"lives with their family"} Lives in: {Lives in:25570} Stairs: {opstairs:27293} Has following equipment at home: {Assistive devices:23999}  OCCUPATION: ***  PLOF: Independent  PATIENT GOALS: Pain relief and ***   OBJECTIVE:  DIAGNOSTIC FINDINGS:  Lumbar MRI 08/14/2021: IMPRESSION: 1. Lower lumbar degenerative change, greatest at L5-S1 where there is moderate facet arthropathy and mild bilateral foraminal stenosis. No significant canal stenosis. 2. Cholelithiasis.  PATIENT SURVEYS:  Modified Oswestry ***   SCREENING FOR RED FLAGS: ***  COGNITION: Overall cognitive status: Within functional limits for tasks assessed     SENSATION: WFL  MUSCLE LENGTH: ***  POSTURE:   ***  PALPATION: ***  LUMBAR ROM:   Active  A/PROM  eval  Flexion   Extension   Right lateral flexion   Left lateral flexion   Right rotation   Left rotation    LOWER EXTREMITY ROM:     Passive  Right eval Left eval  Hip flexion    Hip extension    Hip abduction    Hip adduction    Hip  internal rotation    Hip external rotation    Knee flexion    Knee extension    Ankle dorsiflexion    Ankle plantarflexion    Ankle inversion    Ankle eversion     LOWER EXTREMITY MMT:    MMT Right eval Left eval  Hip flexion    Hip extension    Hip abduction    Knee flexion     Knee extension    Ankle dorsiflexion    Ankle plantarflexion    Ankle inversion    Ankle eversion     LUMBAR SPECIAL TESTS:  {lumbar special test:25242}  FUNCTIONAL TESTS:  {Functional tests:24029}  GAIT: Distance walked: *** Assistive device utilized: {Assistive devices:23999} Level of assistance: {Levels of assistance:24026} Comments: ***   TODAY'S TREATMENT  ***   PATIENT EDUCATION:  Education details: Exam findings, POC, HEP Person educated: Patient Education method: Explanation, Demonstration, Tactile cues, Verbal cues, and Handouts Education comprehension: verbalized understanding, returned demonstration, verbal cues required, tactile cues required, and needs further education  HOME EXERCISE PROGRAM: ***   ASSESSMENT: CLINICAL IMPRESSION: Patient is a 51 y.o. female who was seen today for physical therapy evaluation and treatment for ***.    OBJECTIVE IMPAIRMENTS {opptimpairments:25111}.   ACTIVITY LIMITATIONS {activitylimitations:27494}  PARTICIPATION LIMITATIONS: {participationrestrictions:25113}  PERSONAL FACTORS {Personal factors:25162} are also affecting patient's functional outcome.   REHAB POTENTIAL: {rehabpotential:25112}  CLINICAL DECISION MAKING: {clinical decision making:25114}  EVALUATION COMPLEXITY: {Evaluation complexity:25115}   GOALS: Goals reviewed with patient? Yes  SHORT TERM GOALS: Target date: {follow up:25551}  Patient will be I with initial HEP in order to progress with therapy. Baseline: Goal status: {GOALSTATUS:25110}  2.  *** Baseline:  Goal status: {GOALSTATUS:25110}  3.  *** Baseline:  Goal status: {GOALSTATUS:25110}  LONG TERM GOALS: Target date: {follow up:25551}  Patient will be I with final HEP to maintain progress from PT. Baseline:  Goal status: {GOALSTATUS:25110}  2.  *** Baseline:  Goal status: {GOALSTATUS:25110}  3.  *** Baseline:  Goal status: {GOALSTATUS:25110}  4.  *** Baseline:  Goal  status: {GOALSTATUS:25110}   PLAN: PT FREQUENCY: {rehab frequency:25116}  PT DURATION: {rehab duration:25117}  PLANNED INTERVENTIONS: {rehab planned interventions:25118::"Therapeutic exercises","Therapeutic activity","Neuromuscular re-education","Balance training","Gait training","Patient/Family education","Joint mobilization"}.  PLAN FOR NEXT SESSION: ***   Hilda Blades, PT, DPT, LAT, ATC 10/01/21  8:42 AM Phone: [phone] Fax: [phone]

## 2021-10-04 ENCOUNTER — Encounter: Payer: Self-pay | Admitting: Physical Therapy

## 2021-10-04 ENCOUNTER — Ambulatory Visit: Payer: Self-pay | Attending: Obstetrics and Gynecology | Admitting: Physical Therapy

## 2021-10-04 ENCOUNTER — Other Ambulatory Visit: Payer: Self-pay

## 2021-10-04 DIAGNOSIS — M545 Low back pain, unspecified: Secondary | ICD-10-CM | POA: Insufficient documentation

## 2021-10-04 DIAGNOSIS — M4316 Spondylolisthesis, lumbar region: Secondary | ICD-10-CM | POA: Insufficient documentation

## 2021-10-04 DIAGNOSIS — M6281 Muscle weakness (generalized): Secondary | ICD-10-CM | POA: Insufficient documentation

## 2021-10-04 DIAGNOSIS — R293 Abnormal posture: Secondary | ICD-10-CM | POA: Insufficient documentation

## 2021-10-04 DIAGNOSIS — M5136 Other intervertebral disc degeneration, lumbar region: Secondary | ICD-10-CM | POA: Insufficient documentation

## 2021-10-04 DIAGNOSIS — M5459 Other low back pain: Secondary | ICD-10-CM | POA: Insufficient documentation

## 2021-10-04 DIAGNOSIS — M25552 Pain in left hip: Secondary | ICD-10-CM | POA: Insufficient documentation

## 2021-10-04 DIAGNOSIS — G8929 Other chronic pain: Secondary | ICD-10-CM | POA: Insufficient documentation

## 2021-10-04 NOTE — Patient Instructions (Signed)
Access Code: [facility] URL: [email or web address] Date: 10/04/2021 Prepared by: Hilda Blades  Exercises - Supine Lower Trunk Rotation  - 2 x daily - 10 reps - 5 seconds hold - Hooklying Single Knee to Chest Stretch  - 2 x daily - 3 reps - 20 seconds hold - Bridge  - 2 x daily - 2 sets - 10 reps - Straight Leg Raise  - 2 x daily - 2 sets - 10 reps - Sidelying Hip Abduction  - 2 x daily - 2 sets - 10 reps

## 2021-10-05 NOTE — Progress Notes (Signed)
Patient ID: Mariah Lang, female    DOB: September 06, 1970  MRN: [id number]  CC: Urgent Care Follow-Up  Subjective: Mariah Lang is a 51 y.o. female who presents for urgent care follow-up.   Her concerns today include:  09/15/2021 [facility] Sebastian Ache per MD note: I have reviewed the triage vital signs and the nursing notes.   Pertinent labs & imaging results that were available during my care of the patient were reviewed by me and considered in my medical decision making (see chart for details).   We will treat with some ondansetron, since it sounds like she is having a hard time getting much liquids in due to some very mild nausea.  Since she has not had any stooling in the last 3 hours, I am not going to treat her with any antidiarrheal meds medication   Since she is expressing a lot of concern about dehydration we will do some lab work for her  Ondansetron dissolved in the mouth every 8 hours as needed for nausea or vomiting. Clear liquids and bland things to eat.  10/12/2021: Improved since Urgent Care discharge. No longer having diarrhea, nausea, or vomiting. Eating and drinking as normal. Lower extremity/bilateral feet pain. Especially after standing long hours at work. Denies red flag symptoms such as but not limited to lower extremity swelling/tenderness/redness/warmth, chest pain, and shortness of breath.  Patient Active Problem List   Diagnosis Date Noted   Hyponatremia 02/20/2021   Aspiration pneumonia of both lower lobes due to gastric secretions ([place]) 02/19/2021   Pulmonary embolism ([place]) 02/19/2021   Acute hypoxemic respiratory failure ([place]) 02/19/2021   Renal cell carcinoma ([place]) 02/17/2021   Ankle edema, bilateral 09/14/2020   Vitamin D deficiency 09/14/2020   Thrombocytosis 05/11/2019   Language barrier 12/31/2018   Screening breast examination 12/04/2018   Fibroid uterus 12/18/2014   Menorrhagia, premenopausal  11/24/2014   Iron deficiency anemia 11/24/2014   Iron deficiency anemia due to chronic blood loss 09/02/2014   Pap smear for cervical cancer screening 09/02/2014   Annual physical exam 09/02/2014   Dysmenorrhea 02/15/2013   Anemia due to Dysmenorrhea 02/15/2013     Current Outpatient Medications on File Prior to Visit  Medication Sig Dispense Refill   DULoxetine (CYMBALTA) 20 MG capsule Take 1 capsule (20 mg total) by mouth daily. 30 capsule 3   methocarbamol (ROBAXIN) 500 MG tablet Take 1 tablet (500 mg total) by mouth every 8 (eight) hours as needed for muscle spasms. (Patient not taking: Reported on 09/22/2021) 60 tablet 3   ondansetron (ZOFRAN-ODT) 4 MG disintegrating tablet Dissolve 1 tablet (4 mg total) by mouth once every 8 (eight) hours as needed for nausea or vomiting. (Patient not taking: Reported on 09/22/2021) 10 tablet 0   No current facility-administered medications on file prior to visit.    No Known Allergies  Social History   Socioeconomic History   Marital status: Divorced    Spouse name: Not on file   Number of children: 2   Years of education: Not on file   Highest education level: 9th grade  Occupational History   Not on file  Tobacco Use   Smoking status: Never    Passive exposure: Never   Smokeless tobacco: Never  Vaping Use   Vaping Use: Never used  Substance and Sexual Activity   Alcohol use: Not Currently   Drug use: Not Currently   Sexual activity: Yes    Birth control/protection: Surgical, None  Other Topics Concern   Not on file  Social History Narrative   ** Merged History Encounter **       ** Merged History Encounter **       Social Determinants of Health   Financial Resource Strain: Not on file  Food Insecurity: Not on file  Transportation Needs: No Transportation Needs (12/04/2018)   PRAPARE - Hydrologist (Medical): No    Lack of Transportation (Non-Medical): No  Physical Activity: Not on file   Stress: Not on file  Social Connections: Not on file  Intimate Partner Violence: Not on file    Family History  Problem Relation Age of Onset   Migraines Mother    Hypertension Mother    Migraines Father    Hypertension Father    Diabetes Mother    Diabetes Paternal Grandmother     Past Surgical History:  Procedure Laterality Date   APPENDECTOMY     CESAREAN SECTION  2000   KIDNEY SURGERY     LAPAROSCOPIC NEPHRECTOMY, HAND ASSISTED Left 02/17/2021   Procedure: HAND ASSISTED LAPAROSCOPIC RADICAL  NEPHRECTOMY;  Surgeon: Hollice Espy, MD;  Location: [facility];  Service: Urology;  Laterality: Left;   LAPAROTOMY  02/17/2021   Procedure: LAPAROTOMY OR HAND ASSIST;  Surgeon: Gillis Ends, MD;  Location: [facility];  Service: Gynecology;;  Fibroid excision    OTHER SURGICAL HISTORY  1993   c-section   PARTIAL HYSTERECTOMY     ROBOTIC ASSISTED LAPAROSCOPIC HYSTERECTOMY AND SALPINGECTOMY Bilateral 02/17/2021   Procedure: XI ROBOTIC ASSISTED LAPAROSCOPIC HYSTERECTOMY AND SALPINGECTOMY;  Surgeon: Gillis Ends, MD;  Location: [facility];  Service: Gynecology;  Laterality: Bilateral;    ROS: Review of Systems Negative except as stated above  PHYSICAL EXAM: BP 112/76 (BP Location: Left Arm, Patient Position: Sitting, Cuff Size: Large)   Pulse 82   Temp 98.3 F (36.8 C)   Resp 16   Ht 4' 11.06" (1.5 m)   Wt 200 lb (90.7 kg)   LMP 01/18/2019   SpO2 95%   BMI 40.32 kg/m   Physical Exam HENT:     Head: Normocephalic and atraumatic.  Eyes:     Extraocular Movements: Extraocular movements intact.     Conjunctiva/sclera: Conjunctivae normal.     Pupils: Pupils are equal, round, and reactive to light.  Cardiovascular:     Rate and Rhythm: Normal rate and regular rhythm.     Pulses: Normal pulses.     Heart sounds: Normal heart sounds.  Pulmonary:     Effort: Pulmonary effort is normal.     Breath sounds: Normal breath sounds.  Musculoskeletal:      Cervical back: Normal range of motion and neck supple.     Right lower leg: Normal.     Left lower leg: Normal.     Right ankle: Normal.     Left ankle: Normal.  Neurological:     General: No focal deficit present.     Mental Status: She is alert and oriented to person, place, and time.  Psychiatric:        Mood and Affect: Mood normal.        Behavior: Behavior normal.    ASSESSMENT AND PLAN: 1. Encounter to establish care - Patient presents today to establish care.  - Return for annual physical examination, labs, and health maintenance. Arrive fasting meaning having no food for at least 8 hours prior to appointment. You may have only water or black coffee.  Please take scheduled medications as normal.  2. Diarrhea, unspecified type 3. Nausea and vomiting, unspecified vomiting type - Resolved.   4. Pain in both lower legs 5. Pain in both feet 6. Varicose veins of both lower extremities, unspecified whether complicated - No acute findings today in office.  - Likely secondary to standing for long hours at work.  - Try course of over-the-counter compression stockings and elevation while sitting.  - Follow-up with primary provider as scheduled.  7. Language barrier - [employer]. Name: Florentina Jenny  ID#: [id number]  Patient was given the opportunity to ask questions.  Patient verbalized understanding of the plan and was able to repeat key elements of the plan. Patient was given clear instructions to go to Emergency Department or return to medical center if symptoms don't improve, worsen, or new problems develop.The patient verbalized understanding.   Return for Physical per patient preference.  Camillia Herter, NP

## 2021-10-12 ENCOUNTER — Ambulatory Visit (INDEPENDENT_AMBULATORY_CARE_PROVIDER_SITE_OTHER): Payer: Self-pay | Admitting: Family

## 2021-10-12 ENCOUNTER — Encounter: Payer: Self-pay | Admitting: Family

## 2021-10-12 VITALS — BP 112/76 | HR 82 | Temp 98.3°F | Resp 16 | Ht 59.06 in | Wt 200.0 lb

## 2021-10-12 DIAGNOSIS — M79672 Pain in left foot: Secondary | ICD-10-CM

## 2021-10-12 DIAGNOSIS — M79661 Pain in right lower leg: Secondary | ICD-10-CM

## 2021-10-12 DIAGNOSIS — R112 Nausea with vomiting, unspecified: Secondary | ICD-10-CM

## 2021-10-12 DIAGNOSIS — I8393 Asymptomatic varicose veins of bilateral lower extremities: Secondary | ICD-10-CM

## 2021-10-12 DIAGNOSIS — R197 Diarrhea, unspecified: Secondary | ICD-10-CM

## 2021-10-12 DIAGNOSIS — Z789 Other specified health status: Secondary | ICD-10-CM

## 2021-10-12 DIAGNOSIS — Z7689 Persons encountering health services in other specified circumstances: Secondary | ICD-10-CM

## 2021-10-12 DIAGNOSIS — M79662 Pain in left lower leg: Secondary | ICD-10-CM

## 2021-10-12 DIAGNOSIS — M79671 Pain in right foot: Secondary | ICD-10-CM

## 2021-10-12 NOTE — Patient Instructions (Addendum)
Thank you for choosing Primary Care at [facility] for your medical home!    [facility] Mariah Lang was seen by Mariah Herter, NP today.   Mariah Lang primary care provider is Durene Fruits, NP.   For the best care possible,  you should try to see Durene Fruits, NP whenever you come to office.   We look forward to seeing you again soon!  If you have any questions about your visit today,  please call us at [phone]  Or feel free to reach your provider via [place].    Keeping you healthy   Get these tests Blood pressure- Have your blood pressure checked once a year by your healthcare provider.  Normal blood pressure is 120/80. Weight- Have your body mass index (BMI) calculated to screen for obesity.  BMI is a measure of body fat based on height and weight. You can also calculate your own BMI at [employer]velB[employer].it. Cholesterol- Have your cholesterol checked regularly starting at age 17, sooner may be necessary if you have diabetes, high blood pressure, if a family member developed heart diseases at an early age or if you smoke.  Chlamydia, HIV, and other sexual transmitted disease- Get screened each year until the age of 48 then within three months of each new sexual partner. Diabetes- Have your blood sugar checked regularly if you have high blood pressure, high cholesterol, a family history of diabetes or if you are overweight.   Get these vaccines Flu shot- Every fall. Tetanus shot- Every 10 years. Menactra- Single dose; prevents meningitis.   Take these steps Don't smoke- If you do smoke, ask your healthcare provider about quitting. For tips on how to quit, go to www.smokefree.gov or call 1-800-QUIT-NOW. Be physically active- Exercise 5 days a week for at least 30 minutes.  If you are not already physically active start slow and gradually work up to 30 minutes of moderate physical activity.  Examples of moderate activity include walking  briskly, mowing the yard, dancing, swimming bicycling, etc. Eat a healthy diet- Eat a variety of healthy foods such as fruits, vegetables, low fat milk, low fat cheese, yogurt, lean meats, poultry, fish, beans, tofu, etc.  For more information on healthy eating, go to www.thenutritionsource.org Drink alcohol in moderation- Limit alcohol intake two drinks or less a day.  Never drink and drive. Dentist- Brush and floss teeth twice daily; visit your dentis twice a year. Depression-Your emotional health is as important as your physical health.  If you're feeling down, losing interest in things you normally enjoy please talk with your healthcare provider. Gun Safety- If you keep a gun in your home, keep it unloaded and with the safety lock on.  Bullets should be stored separately. Helmet use- Always wear a helmet when riding a motorcycle, bicycle, rollerblading or skateboarding. Safe sex- If you may be exposed to a sexually transmitted infection, use a condom Seat belts- Seat bels can save your life; always wear one. Smoke/Carbon Monoxide detectors- These detectors need to be installed on the appropriate level of your home.  Replace batteries at least once a year. Skin Cancer- When out in the sun, cover up and use sunscreen SPF 15 or higher. Violence- If anyone is threatening or hurting you, please tell your healthcare provider.

## 2021-10-12 NOTE — Progress Notes (Signed)
Pt presents for Ed follow-up from 06/15

## 2021-10-13 ENCOUNTER — Ambulatory Visit: Payer: Self-pay | Admitting: Physical Therapy

## 2021-10-13 ENCOUNTER — Encounter: Payer: Self-pay | Admitting: Physical Therapy

## 2021-10-13 ENCOUNTER — Other Ambulatory Visit: Payer: Self-pay

## 2021-10-13 DIAGNOSIS — G8929 Other chronic pain: Secondary | ICD-10-CM

## 2021-10-13 DIAGNOSIS — M5459 Other low back pain: Secondary | ICD-10-CM

## 2021-10-13 DIAGNOSIS — M6281 Muscle weakness (generalized): Secondary | ICD-10-CM

## 2021-10-13 DIAGNOSIS — R293 Abnormal posture: Secondary | ICD-10-CM

## 2021-10-13 DIAGNOSIS — M25552 Pain in left hip: Secondary | ICD-10-CM

## 2021-10-13 NOTE — Therapy (Signed)
OUTPATIENT PHYSICAL THERAPY TREATMENT NOTE   Patient Name: Mariah Lang MRN: [id number] DOB:09/22/1970, 51 y.o., female Today's Date: 10/13/2021  PCP: Charlott Rakes, MD   REFERRING PROVIDER: Jessy Oto, MD  END OF SESSION:   PT End of Session - 10/13/21 0806     Visit Number 2    Number of Visits 9    Date for PT Re-Evaluation 11/29/21    Authorization Type CAFA    PT Start Time 0800    PT Stop Time 3086    PT Time Calculation (min) 50 min             Past Medical History:  Diagnosis Date   Anemia    Past Surgical History:  Procedure Laterality Date   APPENDECTOMY     CESAREAN SECTION  2000   KIDNEY SURGERY     LAPAROSCOPIC NEPHRECTOMY, HAND ASSISTED Left 02/17/2021   Procedure: HAND ASSISTED LAPAROSCOPIC RADICAL  NEPHRECTOMY;  Surgeon: Hollice Espy, MD;  Location: [facility];  Service: Urology;  Laterality: Left;   LAPAROTOMY  02/17/2021   Procedure: LAPAROTOMY OR HAND ASSIST;  Surgeon: Gillis Ends, MD;  Location: [facility];  Service: Gynecology;;  Fibroid excision    OTHER SURGICAL HISTORY  1993   c-section   PARTIAL HYSTERECTOMY     ROBOTIC ASSISTED LAPAROSCOPIC HYSTERECTOMY AND SALPINGECTOMY Bilateral 02/17/2021   Procedure: XI ROBOTIC ASSISTED LAPAROSCOPIC HYSTERECTOMY AND SALPINGECTOMY;  Surgeon: Gillis Ends, MD;  Location: [facility];  Service: Gynecology;  Laterality: Bilateral;   Patient Active Problem List   Diagnosis Date Noted   Hyponatremia 02/20/2021   Aspiration pneumonia of both lower lobes due to gastric secretions ([place]) 02/19/2021   Pulmonary embolism ([place]) 02/19/2021   Acute hypoxemic respiratory failure ([place]) 02/19/2021   Renal cell carcinoma ([place]) 02/17/2021   Ankle edema, bilateral 09/14/2020   Vitamin D deficiency 09/14/2020   Thrombocytosis 05/11/2019   Language barrier 12/31/2018   Screening breast examination 12/04/2018   Fibroid uterus 12/18/2014   Menorrhagia, premenopausal  11/24/2014   Iron deficiency anemia 11/24/2014   Iron deficiency anemia due to chronic blood loss 09/02/2014   Pap smear for cervical cancer screening 09/02/2014   Annual physical exam 09/02/2014   Dysmenorrhea 02/15/2013   Anemia due to Dysmenorrhea 02/15/2013    REFERRING DIAG:  Spondylolisthesis, lumbar region Acute bilateral low back pain Degenerative disc disease  THERAPY DIAG:  Other low back pain  Muscle weakness (generalized)  Chronic left-sided low back pain without sciatica  Pain in left hip  Abnormal posture  Rationale for Evaluation and Treatment Rehabilitation  PERTINENT HISTORY: Left renal mass and and uterine fibroids s/p LAPAROSCOPIC HYSTERECTOMY AND SALPINGECTOMY HAND ASSISTED LAPAROSCOPIC RADICAL NEPHRECTOMY on 02/17/2021  PRECAUTIONS: None  SUBJECTIVE: My pain is 9/10 left lower back. I only have pain with sit to stand transfers.  I have done the exercises.   PAIN:  Are you having pain? Yes:  NPRS scale: 9/10 Pain location: Low back Pain description: Intermittent, "very painful" Aggravating factors: Sitting, standing, walking Relieving factors: NSAIDs   OBJECTIVE: (objective measures completed at initial evaluation unless otherwise dated)   DIAGNOSTIC FINDINGS:  Lumbar MRI 08/14/2021: IMPRESSION: 1. Lower lumbar degenerative change, greatest at L5-S1 where there is moderate facet arthropathy and mild bilateral foraminal stenosis. No significant canal stenosis. 2. Cholelithiasis.   PATIENT SURVEYS:  Modified Oswestry 32/50    SCREENING FOR RED FLAGS: Negative   COGNITION: Overall cognitive status: Within functional limits for tasks assessed  SENSATION: WFL   MUSCLE LENGTH: Hamstring grossly WFL but with pain at end range   POSTURE:  Patient with rounded shoulder and forward head posture   PALPATION: Tender to palpation bilateral lumbar paraspinal and midline lumbar   LUMBAR ROM:    Active  AROM  eval   Flexion 75%  Extension 50%  Right lateral flexion 50%  Left lateral flexion 50%  Right rotation 50%  Left rotation 50%    LOWER EXTREMITY ROM:    LE ROM grossly WFL but with low back pain at end ranges   LOWER EXTREMITY MMT:     MMT Right eval Left eval  Hip flexion 4- 4-  Hip extension 3 3  Hip abduction 3 3  Knee flexion 5 4  Knee extension 5 4  Ankle dorsiflexion 5 5    Patient unable to hold 90-90 table top position due to pain and weakness   LUMBAR SPECIAL TESTS:  Radicular testing negative   GAIT: Assistive device utilized: None Level of assistance: Complete Independence Comments: patient with slow/hesitant gait after standing     TODAY'S TREATMENT  [facility] Adult PT Treatment:                                                DATE: 10/13/21 Therapeutic Exercise: Nustep L4 UE/LE x 5 minutes  Seated childs pose forward and lateral Attempted traditional childs pose- increased pain and difficulty SLR with abdominal brace x 10 Bridge 5 sec x 10  SKTC using towel to assist 3 x 20 sec  Knee to opp shoulder 20 sec x 3 each Hip abduction x10   Manual Therapy: HMP lumbar x 15 minutes    INITIAL TREATMENT: LTR x 10 Hooklying SKTC holding behind thigh 2 x 20 sec Bridge x 10 SLR x 10 each Sidelying hip abduction x 10 each     PATIENT EDUCATION:  Education details: Exam findings, POC, HEP Person educated: Patient Education method: Explanation, Demonstration, Tactile cues, Verbal cues, and Handouts Education comprehension: verbalized understanding, returned demonstration, verbal cues required, tactile cues required, and needs further education   HOME EXERCISE PROGRAM: Access Code: [facility] URL: [email or web address] Date: 10/13/2021 Prepared by: Hessie Diener  Exercises - Supine Lower Trunk Rotation  - 2 x daily - 10 reps - 5 seconds hold - Hooklying Single Knee to Chest Stretch  - 2 x daily - 3 reps - 20 seconds hold - Bridge  - 2 x daily - 2  sets - 10 reps - Straight Leg Raise  - 2 x daily - 2 sets - 10 reps - Sidelying Hip Abduction  - 2 x daily - 2 sets - 10 reps - Seated Flexion Stretch with Swiss Ball  - 1 x daily - 7 x weekly - 1 sets - 5-10 reps - 10 hold - Seated Thoracic Flexion and Rotation with Swiss Ball  - 1 x daily - 7 x weekly - 1 sets - 3 reps - 10 hold     ASSESSMENT: CLINICAL IMPRESSION: Patient is a 51 y.o. female who was seen today for physical therapy treatment for chronic low back pain. Reviewed HEP and  progressed with lumbar flexibility. Her pain is located in left low back and increases with sit-stand transfers. Updated HEP with additional lumbar stretching and applied HMP at end of session. She may benefit from [facility] or STW  to decrease lumbar spasm. She responded well to [facility] Capo at end of session, reporting decreased pain with STS.    OBJECTIVE IMPAIRMENTS decreased activity tolerance, decreased ROM, decreased strength, postural dysfunction, and pain.    ACTIVITY LIMITATIONS carrying, lifting, bending, sitting, standing, and locomotion level   PARTICIPATION LIMITATIONS: meal prep, cleaning, laundry, shopping, community activity, and occupation   [place], Past/current experiences, and Time since onset of injury/illness/exacerbation are also affecting patient's functional outcome.    REHAB POTENTIAL: Good   CLINICAL DECISION MAKING: Stable/uncomplicated   EVALUATION COMPLEXITY: Low     GOALS: Goals reviewed with patient? Yes   SHORT TERM GOALS: Target date: 11/01/2021   Patient will be I with initial HEP in order to progress with therapy. Baseline: HEP provided at eval Goal status: INITIAL   2.  Patient will report pain level </= 6/10 in order to reduce functional limitations Baseline: 9/10 pain level Goal status: INITIAL   LONG TERM GOALS: Target date: 11/29/2021   Patient will be I with final HEP to maintain progress from PT. Baseline: HEP provided at eval Goal status:  INITIAL   2.  Patient will report </=  20/50 on Modified ODI to indicate improved functional ability. Baseline: 32/50 Goal status: INITIAL   3.  Patient will exhibit gross core and hip strength >/= 4/5 MMT in order to improve standing and walking tolerance. Baseline: grossly 3/5 MMT, see above Goal status: INITIAL   4.  Patient will demonstrate a 25% improvement in lumbar AROM in order to reduce pain and improve ability to perform work related tasks. Baseline: patient exhibits limitation in all lumbar AROM, see above Goal status: INITIAL     PLAN: PT FREQUENCY: 1x/week   PT DURATION: 8 weeks   PLANNED INTERVENTIONS: Therapeutic exercises, Therapeutic activity, Neuromuscular re-education, Balance training, Gait training, Patient/Family education, Joint manipulation, Joint mobilization, Aquatic Therapy, Dry Needling, Electrical stimulation, Spinal manipulation, Spinal mobilization, Cryotherapy, Moist heat, Taping, Traction, Manual therapy, and Re-evaluation.   PLAN FOR NEXT SESSION: Review HEP and progress PRN, stretching for lumbar and hip mobility, progress core and hip strength, progress to lifting and core control in upright position, consider TPDN to left lumbar         Hessie Diener, PTA 10/13/21 8:50 AM Phone: [phone] Fax: [phone]

## 2021-10-20 ENCOUNTER — Encounter: Payer: Self-pay | Admitting: Physical Therapy

## 2021-10-20 ENCOUNTER — Ambulatory Visit: Payer: Self-pay | Admitting: Physical Therapy

## 2021-10-20 DIAGNOSIS — M6281 Muscle weakness (generalized): Secondary | ICD-10-CM

## 2021-10-20 DIAGNOSIS — M545 Low back pain, unspecified: Secondary | ICD-10-CM

## 2021-10-20 DIAGNOSIS — M5459 Other low back pain: Secondary | ICD-10-CM

## 2021-10-20 NOTE — Therapy (Signed)
OUTPATIENT PHYSICAL THERAPY TREATMENT NOTE   Patient Name: Mariah Lang MRN: [id number] DOB:12-21-1970, 51 y.o., female Today's Date: 10/20/2021  PCP: Charlott Rakes, MD   REFERRING PROVIDER: Jessy Oto, MD  END OF SESSION:   PT End of Session - 10/20/21 0810     Visit Number 3    Number of Visits 9    Date for PT Re-Evaluation 11/29/21    Authorization Type CAFA    PT Start Time 0810   pt late   PT Stop Time 0858    PT Time Calculation (min) 48 min             Past Medical History:  Diagnosis Date   Anemia    Past Surgical History:  Procedure Laterality Date   APPENDECTOMY     CESAREAN SECTION  2000   KIDNEY SURGERY     LAPAROSCOPIC NEPHRECTOMY, HAND ASSISTED Left 02/17/2021   Procedure: HAND ASSISTED LAPAROSCOPIC RADICAL  NEPHRECTOMY;  Surgeon: Hollice Espy, MD;  Location: [facility];  Service: Urology;  Laterality: Left;   LAPAROTOMY  02/17/2021   Procedure: LAPAROTOMY OR HAND ASSIST;  Surgeon: Gillis Ends, MD;  Location: [facility];  Service: Gynecology;;  Fibroid excision    OTHER SURGICAL HISTORY  1993   c-section   PARTIAL HYSTERECTOMY     ROBOTIC ASSISTED LAPAROSCOPIC HYSTERECTOMY AND SALPINGECTOMY Bilateral 02/17/2021   Procedure: XI ROBOTIC ASSISTED LAPAROSCOPIC HYSTERECTOMY AND SALPINGECTOMY;  Surgeon: Gillis Ends, MD;  Location: [facility];  Service: Gynecology;  Laterality: Bilateral;   Patient Active Problem List   Diagnosis Date Noted   Hyponatremia 02/20/2021   Aspiration pneumonia of both lower lobes due to gastric secretions ([place]) 02/19/2021   Pulmonary embolism ([place]) 02/19/2021   Acute hypoxemic respiratory failure ([place]) 02/19/2021   Renal cell carcinoma ([place]) 02/17/2021   Ankle edema, bilateral 09/14/2020   Vitamin D deficiency 09/14/2020   Thrombocytosis 05/11/2019   Language barrier 12/31/2018   Screening breast examination 12/04/2018   Fibroid uterus 12/18/2014   Menorrhagia, premenopausal  11/24/2014   Iron deficiency anemia 11/24/2014   Iron deficiency anemia due to chronic blood loss 09/02/2014   Pap smear for cervical cancer screening 09/02/2014   Annual physical exam 09/02/2014   Dysmenorrhea 02/15/2013   Anemia due to Dysmenorrhea 02/15/2013    REFERRING DIAG:  Spondylolisthesis, lumbar region Acute bilateral low back pain Degenerative disc disease  THERAPY DIAG:  Other low back pain  Muscle weakness (generalized)  Chronic left-sided low back pain without sciatica  Rationale for Evaluation and Treatment Rehabilitation  PERTINENT HISTORY: Left renal mass and and uterine fibroids s/p LAPAROSCOPIC HYSTERECTOMY AND SALPINGECTOMY HAND ASSISTED LAPAROSCOPIC RADICAL NEPHRECTOMY on 02/17/2021  PRECAUTIONS: None  SUBJECTIVE: My pain is 9/10 left lower back when I woke up.  It still hurts most with sit-stand transfers. It gets better when I walk. I have used heat but the pain comes back. My pain is 5-6/10 after meds and applying cream to the area.   PAIN:  Are you having pain? Yes:  NPRS scale: 5-6/10 Pain location: Low back Pain description: Intermittent, "very painful" Aggravating factors: Sitting, standing, walking Relieving factors: NSAIDs   OBJECTIVE: (objective measures completed at initial evaluation unless otherwise dated)   DIAGNOSTIC FINDINGS:  Lumbar MRI 08/14/2021: IMPRESSION: 1. Lower lumbar degenerative change, greatest at L5-S1 where there is moderate facet arthropathy and mild bilateral foraminal stenosis. No significant canal stenosis. 2. Cholelithiasis.   PATIENT SURVEYS:  Modified Oswestry 32/50    SCREENING FOR  RED FLAGS: Negative   COGNITION: Overall cognitive status: Within functional limits for tasks assessed                          SENSATION: WFL   MUSCLE LENGTH: Hamstring grossly WFL but with pain at end range   POSTURE:  Patient with rounded shoulder and forward head posture   PALPATION: Tender to palpation  bilateral lumbar paraspinal and midline lumbar   LUMBAR ROM:    Active  AROM  eval  Flexion 75%  Extension 50%  Right lateral flexion 50%  Left lateral flexion 50%  Right rotation 50%  Left rotation 50%    LOWER EXTREMITY ROM:    LE ROM grossly WFL but with low back pain at end ranges   LOWER EXTREMITY MMT:     MMT Right eval Left eval  Hip flexion 4- 4-  Hip extension 3 3  Hip abduction 3 3  Knee flexion 5 4  Knee extension 5 4  Ankle dorsiflexion 5 5    Patient unable to hold 90-90 table top position due to pain and weakness   LUMBAR SPECIAL TESTS:  Radicular testing negative   GAIT: Assistive device utilized: None Level of assistance: Complete Independence Comments: patient with slow/hesitant gait after standing     TODAY'S TREATMENT  [facility] Adult PT Treatment:                                                DATE: 10/20/21 Therapeutic Exercise: Nustep L4 UE/LE x 5 minutes  Seated childs pose forward and lateral S/L Ql stretch SKTC using towel to assist 3 x 20 sec  Hooklying green band x 20 Clam  Banded bridge green   Manual Therapy: STW to right gluteals low back  Passive hamstring, hip IR, ER left  Modalities:  HMP lumbar x 15 minutes   [facility] Adult PT Treatment:                                                DATE: 10/13/21 Therapeutic Exercise: Nustep L4 UE/LE x 5 minutes  Seated childs pose forward and lateral Attempted traditional childs pose- increased pain and difficulty SLR with abdominal brace x 10 Bridge 5 sec x 10  SKTC using towel to assist 3 x 20 sec  Knee to opp shoulder 20 sec x 3 each Hip abduction x10   Modalities: HMP lumbar x 15 minutes    INITIAL TREATMENT: LTR x 10 Hooklying SKTC holding behind thigh 2 x 20 sec Bridge x 10 SLR x 10 each Sidelying hip abduction x 10 each     PATIENT EDUCATION:  Education details: Exam findings, POC, HEP Person educated: Patient Education method: Explanation, Demonstration, Tactile cues,  Verbal cues, and Handouts Education comprehension: verbalized understanding, returned demonstration, verbal cues required, tactile cues required, and needs further education   HOME EXERCISE PROGRAM: Access Code: [facility] URL: [email or web address] Date: 10/13/2021 Prepared by: Hessie Diener  Exercises - Supine Lower Trunk Rotation  - 2 x daily - 10 reps - 5 seconds hold - Hooklying Single Knee to Chest Stretch  - 2 x daily - 3 reps - 20 seconds hold - Bridge  - 2  x daily - 2 sets - 10 reps - Straight Leg Raise  - 2 x daily - 2 sets - 10 reps - Sidelying Hip Abduction  - 2 x daily - 2 sets - 10 reps - Seated Flexion Stretch with Swiss Ball  - 1 x daily - 7 x weekly - 1 sets - 5-10 reps - 10 hold - Seated Thoracic Flexion and Rotation with Swiss Ball  - 1 x daily - 7 x weekly - 1 sets - 3 reps - 10 hold     ASSESSMENT: CLINICAL IMPRESSION: Patient is a 51 y.o. female who was seen today for physical therapy treatment for chronic low back pain. Reviewed HEP and  progressed with lumbar flexibility and hip/lumbar stability. Her pain is located in left low back and increases with sit-stand transfers. STW performed to left lower back and gluteal in sidelying, very tender to moderate pressure.  She may benefit from [facility] to decrease lumbar spasm. Repeated HMP at end of session per pt request.    OBJECTIVE IMPAIRMENTS decreased activity tolerance, decreased ROM, decreased strength, postural dysfunction, and pain.    ACTIVITY LIMITATIONS carrying, lifting, bending, sitting, standing, and locomotion level   PARTICIPATION LIMITATIONS: meal prep, cleaning, laundry, shopping, community activity, and occupation   [place], Past/current experiences, and Time since onset of injury/illness/exacerbation are also affecting patient's functional outcome.    REHAB POTENTIAL: Good   CLINICAL DECISION MAKING: Stable/uncomplicated   EVALUATION COMPLEXITY: Low      GOALS: Goals reviewed with patient? Yes   SHORT TERM GOALS: Target date: 11/01/2021   Patient will be I with initial HEP in order to progress with therapy. Baseline: HEP provided at eval Goal status: INITIAL   2.  Patient will report pain level </= 6/10 in order to reduce functional limitations Baseline: 9/10 pain level Goal status: INITIAL   LONG TERM GOALS: Target date: 11/29/2021   Patient will be I with final HEP to maintain progress from PT. Baseline: HEP provided at eval Goal status: INITIAL   2.  Patient will report </=  20/50 on Modified ODI to indicate improved functional ability. Baseline: 32/50 Goal status: INITIAL   3.  Patient will exhibit gross core and hip strength >/= 4/5 MMT in order to improve standing and walking tolerance. Baseline: grossly 3/5 MMT, see above Goal status: INITIAL   4.  Patient will demonstrate a 25% improvement in lumbar AROM in order to reduce pain and improve ability to perform work related tasks. Baseline: patient exhibits limitation in all lumbar AROM, see above Goal status: INITIAL     PLAN: PT FREQUENCY: 1x/week   PT DURATION: 8 weeks   PLANNED INTERVENTIONS: Therapeutic exercises, Therapeutic activity, Neuromuscular re-education, Balance training, Gait training, Patient/Family education, Joint manipulation, Joint mobilization, Aquatic Therapy, Dry Needling, Electrical stimulation, Spinal manipulation, Spinal mobilization, Cryotherapy, Moist heat, Taping, Traction, Manual therapy, and Re-evaluation.   PLAN FOR NEXT SESSION: Review HEP and progress PRN, stretching for lumbar and hip mobility, progress core and hip strength, progress to lifting and core control in upright position, consider TPDN to left lumbar         Hessie Diener, PTA 10/20/21 8:58 AM Phone: [phone] Fax: [phone]

## 2021-10-26 NOTE — Therapy (Signed)
OUTPATIENT PHYSICAL THERAPY TREATMENT NOTE   Patient Name: Mariah Lang MRN: [id number] DOB:1971-01-22, 51 y.o., female Today's Date: 10/27/2021  PCP: Charlott Rakes, MD   REFERRING PROVIDER: Jessy Oto, MD  END OF SESSION:   PT End of Session - 10/27/21 0856     Visit Number 4    Number of Visits 9    Date for PT Re-Evaluation 11/29/21    Authorization Type CAFA    PT Start Time 0856   patient late   PT Stop Time 0947    PT Time Calculation (min) 51 min    Activity Tolerance Patient tolerated treatment well    Behavior During Therapy [facility] for tasks assessed/performed              Past Medical History:  Diagnosis Date   Anemia    Past Surgical History:  Procedure Laterality Date   APPENDECTOMY     CESAREAN SECTION  2000   KIDNEY SURGERY     LAPAROSCOPIC NEPHRECTOMY, HAND ASSISTED Left 02/17/2021   Procedure: HAND ASSISTED LAPAROSCOPIC RADICAL  NEPHRECTOMY;  Surgeon: Hollice Espy, MD;  Location: [facility] ORS;  Service: Urology;  Laterality: Left;   LAPAROTOMY  02/17/2021   Procedure: LAPAROTOMY OR HAND ASSIST;  Surgeon: Gillis Ends, MD;  Location: [facility];  Service: Gynecology;;  Fibroid excision    OTHER SURGICAL HISTORY  1993   c-section   PARTIAL HYSTERECTOMY     ROBOTIC ASSISTED LAPAROSCOPIC HYSTERECTOMY AND SALPINGECTOMY Bilateral 02/17/2021   Procedure: XI ROBOTIC ASSISTED LAPAROSCOPIC HYSTERECTOMY AND SALPINGECTOMY;  Surgeon: Gillis Ends, MD;  Location: [facility];  Service: Gynecology;  Laterality: Bilateral;   Patient Active Problem List   Diagnosis Date Noted   Hyponatremia 02/20/2021   Aspiration pneumonia of both lower lobes due to gastric secretions ([place]) 02/19/2021   Pulmonary embolism ([place]) 02/19/2021   Acute hypoxemic respiratory failure ([place]) 02/19/2021   Renal cell carcinoma ([place]) 02/17/2021   Ankle edema, bilateral 09/14/2020   Vitamin D deficiency 09/14/2020   Thrombocytosis 05/11/2019    Language barrier 12/31/2018   Screening breast examination 12/04/2018   Fibroid uterus 12/18/2014   Menorrhagia, premenopausal 11/24/2014   Iron deficiency anemia 11/24/2014   Iron deficiency anemia due to chronic blood loss 09/02/2014   Pap smear for cervical cancer screening 09/02/2014   Annual physical exam 09/02/2014   Dysmenorrhea 02/15/2013   Anemia due to Dysmenorrhea 02/15/2013    REFERRING DIAG:  Spondylolisthesis, lumbar region Acute bilateral low back pain Degenerative disc disease  THERAPY DIAG:  Other low back pain  Muscle weakness (generalized)  Rationale for Evaluation and Treatment Rehabilitation  PERTINENT HISTORY: Left renal mass and and uterine fibroids s/p LAPAROSCOPIC HYSTERECTOMY AND SALPINGECTOMY HAND ASSISTED LAPAROSCOPIC RADICAL NEPHRECTOMY on 02/17/2021  PRECAUTIONS: None  SUBJECTIVE: "Today I am doing much better." She reports compliance with HEP.   PAIN:  Are you having pain? Yes:  NPRS scale: 6/10 Pain location: Low back Pain description: Intermittent, "very painful" Aggravating factors: Sitting, standing, walking Relieving factors: NSAIDs   OBJECTIVE: (objective measures completed at initial evaluation unless otherwise dated)   DIAGNOSTIC FINDINGS:  Lumbar MRI 08/14/2021: IMPRESSION: 1. Lower lumbar degenerative change, greatest at L5-S1 where there is moderate facet arthropathy and mild bilateral foraminal stenosis. No significant canal stenosis. 2. Cholelithiasis.   PATIENT SURVEYS:  Modified Oswestry 32/50    SCREENING FOR RED FLAGS: Negative   COGNITION: Overall cognitive status: Within functional limits for tasks assessed  SENSATION: WFL   MUSCLE LENGTH: Hamstring grossly WFL but with pain at end range   POSTURE:  Patient with rounded shoulder and forward head posture   PALPATION: Tender to palpation bilateral lumbar paraspinal and midline lumbar  10/27/21: Lt anterior rotated innominate     LUMBAR ROM:    Active  AROM  eval 10/27/21  Flexion 75% WNL pain with rising to standing  Extension 50% 25% limited   Right lateral flexion 50% 25% limited  Left lateral flexion 50% 25% limited  Right rotation 50% 50% limited   Left rotation 50% 50% limited pain    LOWER EXTREMITY ROM:    LE ROM grossly WFL but with low back pain at end ranges   LOWER EXTREMITY MMT:     MMT Right eval Left eval  Hip flexion 4- 4-  Hip extension 3 3  Hip abduction 3 3  Knee flexion 5 4  Knee extension 5 4  Ankle dorsiflexion 5 5    Patient unable to hold 90-90 table top position due to pain and weakness   LUMBAR SPECIAL TESTS:  Radicular testing negative   GAIT: Assistive device utilized: None Level of assistance: Complete Independence Comments: patient with slow/hesitant gait after standing     TODAY'S TREATMENT  [facility] Adult PT Treatment:                                                DATE: 10/27/21 Therapeutic Exercise: Seated figure 4 x 60 sec each (attempted in supine, unable due to pain)  Supine posterior pelvic tilts 2 x 10; 5 sec hold  Clamshells 2 x 15  Seated TA march 2 x 10  Seated pelvic tilts 2 x 10  Manual Therapy: STM to Lt lumbar paraspinals and gluteal musculature  Muscle energy technique to improve pelvic alignment Demonstrated and returned demo of self-soft tissue mobilization with tennis ball to add as part of HEP  [facility] Adult PT Treatment:                                                DATE: 10/20/21 Therapeutic Exercise: Nustep L4 UE/LE x 5 minutes  Seated childs pose forward and lateral S/L Ql stretch SKTC using towel to assist 3 x 20 sec  Hooklying green band x 20 Clam  Banded bridge green   Manual Therapy: STW to right gluteals low back  Passive hamstring, hip IR, ER left  Modalities:  HMP lumbar x 15 minutes   [facility] Adult PT Treatment:                                                DATE: 10/13/21 Therapeutic Exercise: Nustep L4 UE/LE x 5 minutes   Seated childs pose forward and lateral Attempted traditional childs pose- increased pain and difficulty SLR with abdominal brace x 10 Bridge 5 sec x 10  SKTC using towel to assist 3 x 20 sec  Knee to opp shoulder 20 sec x 3 each Hip abduction x10   Modalities: HMP lumbar x 15 minutes       PATIENT EDUCATION:  Education details: HEP Person educated: patient  Education method: Systems developer, cues Education comprehension: returned demo, cues    HOME EXERCISE PROGRAM: Access Code: [facility] URL: [email or web address] Date: 10/13/2021 Prepared by: Hessie Diener  Exercises - Supine Lower Trunk Rotation  - 2 x daily - 10 reps - 5 seconds hold - Hooklying Single Knee to Chest Stretch  - 2 x daily - 3 reps - 20 seconds hold - Bridge  - 2 x daily - 2 sets - 10 reps - Straight Leg Raise  - 2 x daily - 2 sets - 10 reps - Sidelying Hip Abduction  - 2 x daily - 2 sets - 10 reps - Seated Flexion Stretch with Swiss Ball  - 1 x daily - 7 x weekly - 1 sets - 5-10 reps - 10 hold - Seated Thoracic Flexion and Rotation with Swiss Ball  - 1 x daily - 7 x weekly - 1 sets - 3 reps - 10 hold     ASSESSMENT: CLINICAL IMPRESSION: Overall good tolerance to today's session focusing on progressing core stabilization and hip strengthening. Some positions caused increased low back pain and were modified for comfort (see interventions above for specifics). She required intermittent cues to maintain appropriate lumbopelvic alignment with strengthening exercises. She was noted to have a Lt anterior rotated innominate that easily corrected with muscle energy technique. She demonstrates improvement in lumbar flexion, extension, and lateral flexion compared to initial evaluation with rotation remaining the most limited bilaterally.    OBJECTIVE IMPAIRMENTS decreased activity tolerance, decreased ROM, decreased strength, postural dysfunction, and pain.    ACTIVITY LIMITATIONS carrying, lifting, bending,  sitting, standing, and locomotion level   PARTICIPATION LIMITATIONS: meal prep, cleaning, laundry, shopping, community activity, and occupation   [place], Past/current experiences, and Time since onset of injury/illness/exacerbation are also affecting patient's functional outcome.    REHAB POTENTIAL: Good   CLINICAL DECISION MAKING: Stable/uncomplicated   EVALUATION COMPLEXITY: Low     GOALS: Goals reviewed with patient? Yes   SHORT TERM GOALS: Target date: 11/01/2021   Patient will be I with initial HEP in order to progress with therapy. Baseline: HEP provided at eval Goal status: INITIAL   2.  Patient will report pain level </= 6/10 in order to reduce functional limitations Baseline: 9/10 pain level Goal status: INITIAL   LONG TERM GOALS: Target date: 11/29/2021   Patient will be I with final HEP to maintain progress from PT. Baseline: HEP provided at eval Goal status: INITIAL   2.  Patient will report </=  20/50 on Modified ODI to indicate improved functional ability. Baseline: 32/50 Goal status: INITIAL   3.  Patient will exhibit gross core and hip strength >/= 4/5 MMT in order to improve standing and walking tolerance. Baseline: grossly 3/5 MMT, see above Goal status: INITIAL   4.  Patient will demonstrate a 25% improvement in lumbar AROM in order to reduce pain and improve ability to perform work related tasks. Baseline: patient exhibits limitation in all lumbar AROM, see above Goal status: INITIAL     PLAN: PT FREQUENCY: 1x/week   PT DURATION: 8 weeks   PLANNED INTERVENTIONS: Therapeutic exercises, Therapeutic activity, Neuromuscular re-education, Balance training, Gait training, Patient/Family education, Joint manipulation, Joint mobilization, Aquatic Therapy, Dry Needling, Electrical stimulation, Spinal manipulation, Spinal mobilization, Cryotherapy, Moist heat, Taping, Traction, Manual therapy, and Re-evaluation.   PLAN FOR NEXT SESSION:  Review HEP and progress PRN, stretching for lumbar and hip mobility, progress core and hip strength, progress to  lifting and core control in upright position, consider TPDN to left lumbar       Gwendolyn Grant, PT, DPT, ATC 10/27/21 9:38 AM

## 2021-10-27 ENCOUNTER — Ambulatory Visit: Payer: Self-pay

## 2021-10-27 DIAGNOSIS — M6281 Muscle weakness (generalized): Secondary | ICD-10-CM

## 2021-10-27 DIAGNOSIS — M5459 Other low back pain: Secondary | ICD-10-CM

## 2021-10-28 ENCOUNTER — Encounter: Payer: Self-pay | Admitting: Specialist

## 2021-10-28 ENCOUNTER — Ambulatory Visit (INDEPENDENT_AMBULATORY_CARE_PROVIDER_SITE_OTHER): Payer: Self-pay | Admitting: Specialist

## 2021-10-28 VITALS — BP 107/74 | HR 75 | Ht 59.06 in | Wt 200.0 lb

## 2021-10-28 DIAGNOSIS — M5136 Other intervertebral disc degeneration, lumbar region: Secondary | ICD-10-CM

## 2021-10-28 DIAGNOSIS — M4316 Spondylolisthesis, lumbar region: Secondary | ICD-10-CM

## 2021-10-28 DIAGNOSIS — M533 Sacrococcygeal disorders, not elsewhere classified: Secondary | ICD-10-CM

## 2021-10-28 MED ORDER — TRAMADOL-ACETAMINOPHEN 37.5-325 MG PO TABS: 1.0000 | ORAL_TABLET | Freq: Four times a day (QID) | ORAL | 0 refills | Status: DC | PRN

## 2021-10-28 NOTE — Patient Instructions (Signed)
Avoid frequent bending and stooping  No lifting greater than 10 lbs. May use ice or moist heat for pain. Weight loss is of benefit. Best medication for lumbar disc disease is arthritis medications like tylenol, motrin, celebrex and naprosyn. Exercise is important to improve your indurance and does allow people to function better inspite of back pain. Referral to Dr. Ernestina Patches for left SI joint block with marcaine and steroid, Eval for RFA if of benefit.

## 2021-10-28 NOTE — Progress Notes (Signed)
Office Visit Note   Patient: Mariah Lang           Date of Birth: Aug 22, 1970           MRN: [id number] Visit Date: 10/28/2021              Requested by: No referring provider defined for this encounter. PCP: System, Provider Not In   Assessment & Plan: Visit Diagnoses:  1. Spondylolisthesis, lumbar region   2. Degenerative disc disease, lumbar   3. Sacroiliac pain     Plan: Avoid frequent bending and stooping  No lifting greater than 10 lbs. May use ice or moist heat for pain. Weight loss is of benefit. Best medication for lumbar disc disease is arthritis medications like tylenol, motrin, celebrex and naprosyn. Exercise is important to improve your indurance and does allow people to function better inspite of back pain. Referral to Dr. Ernestina Patches for left SI joint block with marcaine and steroid, Eval for RFA if of benefit  Follow-Up Instructions: No follow-ups on file.   Orders:  No orders of the defined types were placed in this encounter.  No orders of the defined types were placed in this encounter.     Procedures: No procedures performed   Clinical Data: No additional findings.   Subjective: Chief Complaint  Patient presents with   Lower Back - Pain    HPI  Review of Systems   Objective: Vital Signs: BP 107/74   Pulse 75   Ht 4' 11.06" (1.5 m)   Wt 200 lb (90.7 kg)   LMP 01/18/2019   BMI 40.31 kg/m   Physical Exam  Ortho Exam  Specialty Comments:  No specialty comments available.  Imaging: No results found.   PMFS History: Patient Active Problem List   Diagnosis Date Noted   Hyponatremia 02/20/2021   Aspiration pneumonia of both lower lobes due to gastric secretions ([place]) 02/19/2021   Pulmonary embolism ([place]) 02/19/2021   Acute hypoxemic respiratory failure ([place]) 02/19/2021   Renal cell carcinoma ([place]) 02/17/2021   Ankle edema, bilateral 09/14/2020   Vitamin D deficiency 09/14/2020   Thrombocytosis 05/11/2019    Language barrier 12/31/2018   Screening breast examination 12/04/2018   Fibroid uterus 12/18/2014   Menorrhagia, premenopausal 11/24/2014   Iron deficiency anemia 11/24/2014   Iron deficiency anemia due to chronic blood loss 09/02/2014   Pap smear for cervical cancer screening 09/02/2014   Annual physical exam 09/02/2014   Dysmenorrhea 02/15/2013   Anemia due to Dysmenorrhea 02/15/2013   Past Medical History:  Diagnosis Date   Anemia     Family History  Problem Relation Age of Onset   Migraines Mother    Hypertension Mother    Migraines Father    Hypertension Father    Diabetes Mother    Diabetes Paternal Grandmother     Past Surgical History:  Procedure Laterality Date   APPENDECTOMY     CESAREAN SECTION  2000   KIDNEY SURGERY     LAPAROSCOPIC NEPHRECTOMY, HAND ASSISTED Left 02/17/2021   Procedure: HAND ASSISTED LAPAROSCOPIC RADICAL  NEPHRECTOMY;  Surgeon: Hollice Espy, MD;  Location: [facility];  Service: Urology;  Laterality: Left;   LAPAROTOMY  02/17/2021   Procedure: LAPAROTOMY OR HAND ASSIST;  Surgeon: Gillis Ends, MD;  Location: [facility];  Service: Gynecology;;  Fibroid excision    OTHER SURGICAL HISTORY  1993   c-section   PARTIAL HYSTERECTOMY     ROBOTIC ASSISTED LAPAROSCOPIC HYSTERECTOMY AND SALPINGECTOMY Bilateral 02/17/2021  Procedure: XI ROBOTIC ASSISTED LAPAROSCOPIC HYSTERECTOMY AND SALPINGECTOMY;  Surgeon: Gillis Ends, MD;  Location: [facility] OR[facility];  Service: Gynecology;  Laterality: Bilateral;   Social History   Occupational History   Not on file  Tobacco Use   Smoking status: Never    Passive exposure: Never   Smokeless tobacco: Never  Vaping Use   Vaping Use: Never used  Substance and Sexual Activity   Alcohol use: Not Currently   Drug use: Not Currently   Sexual activity: Yes    Birth control/protection: Surgical, None

## 2021-11-01 ENCOUNTER — Ambulatory Visit: Payer: Self-pay

## 2021-11-01 ENCOUNTER — Ambulatory Visit: Payer: Self-pay | Admitting: Physical Therapy

## 2021-11-01 DIAGNOSIS — M6281 Muscle weakness (generalized): Secondary | ICD-10-CM

## 2021-11-01 DIAGNOSIS — M5459 Other low back pain: Secondary | ICD-10-CM

## 2021-11-01 NOTE — Therapy (Addendum)
OUTPATIENT PHYSICAL THERAPY TREATMENT NOTE PHYSICAL THERAPY DISCHARGE SUMMARY  Visits from Start of Care: 5  Current functional level related to goals / functional outcomes: No formal re-assessment of goals.    Remaining deficits: Status unknown.    Education / Equipment: N/A   Patient agrees to discharge. Patient goals were not met. Patient is being discharged due to not returning since the last visit.   Patient Name: Mariah Lang MRN: [id number] DOB:Feb 01, 1971, 51 y.o., female Today's Date: 11/01/2021  PCP: Charlott Rakes, MD   REFERRING PROVIDER: Jessy Oto, MD  END OF SESSION:   PT End of Session - 11/01/21 1103     Visit Number 5    Number of Visits 9    Date for PT Re-Evaluation 11/29/21    Authorization Type CAFA    PT Start Time 1103    PT Stop Time 1155    PT Time Calculation (min) 52 min    Activity Tolerance Patient tolerated treatment well    Behavior During Therapy WFL for tasks assessed/performed               Past Medical History:  Diagnosis Date   Anemia    Past Surgical History:  Procedure Laterality Date   APPENDECTOMY     CESAREAN SECTION  2000   KIDNEY SURGERY     LAPAROSCOPIC NEPHRECTOMY, HAND ASSISTED Left 02/17/2021   Procedure: HAND ASSISTED LAPAROSCOPIC RADICAL  NEPHRECTOMY;  Surgeon: Hollice Espy, MD;  Location: [facility];  Service: Urology;  Laterality: Left;   LAPAROTOMY  02/17/2021   Procedure: LAPAROTOMY OR HAND ASSIST;  Surgeon: Gillis Ends, MD;  Location: [facility];  Service: Gynecology;;  Fibroid excision    OTHER SURGICAL HISTORY  1993   c-section   PARTIAL HYSTERECTOMY     ROBOTIC ASSISTED LAPAROSCOPIC HYSTERECTOMY AND SALPINGECTOMY Bilateral 02/17/2021   Procedure: XI ROBOTIC ASSISTED LAPAROSCOPIC HYSTERECTOMY AND SALPINGECTOMY;  Surgeon: Gillis Ends, MD;  Location: [facility];  Service: Gynecology;  Laterality: Bilateral;   Patient Active Problem List   Diagnosis Date  Noted   Hyponatremia 02/20/2021   Aspiration pneumonia of both lower lobes due to gastric secretions ([place]) 02/19/2021   Pulmonary embolism ([place]) 02/19/2021   Acute hypoxemic respiratory failure ([place]) 02/19/2021   Renal cell carcinoma ([place]) 02/17/2021   Ankle edema, bilateral 09/14/2020   Vitamin D deficiency 09/14/2020   Thrombocytosis 05/11/2019   Language barrier 12/31/2018   Screening breast examination 12/04/2018   Fibroid uterus 12/18/2014   Menorrhagia, premenopausal 11/24/2014   Iron deficiency anemia 11/24/2014   Iron deficiency anemia due to chronic blood loss 09/02/2014   Pap smear for cervical cancer screening 09/02/2014   Annual physical exam 09/02/2014   Dysmenorrhea 02/15/2013   Anemia due to Dysmenorrhea 02/15/2013    REFERRING DIAG:  Spondylolisthesis, lumbar region Acute bilateral low back pain Degenerative disc disease  THERAPY DIAG:  Other low back pain  Muscle weakness (generalized)  Rationale for Evaluation and Treatment Rehabilitation  PERTINENT HISTORY: Left renal mass and and uterine fibroids s/p LAPAROSCOPIC HYSTERECTOMY AND SALPINGECTOMY HAND ASSISTED LAPAROSCOPIC RADICAL NEPHRECTOMY on 02/17/2021  PRECAUTIONS: None  SUBJECTIVE: Patient reports the back has been bothering her since she made a quick twisting movement over the weekend. She saw ortho for her ongoing back pain who has placed a referral for a potential injection.   PAIN:  Are you having pain? Yes:  NPRS scale: 8/10 Pain location: Low back Pain description: Intermittent, "very painful" Aggravating factors: Sitting, standing, walking  Relieving factors: NSAIDs   OBJECTIVE: (objective measures completed at initial evaluation unless otherwise dated)   DIAGNOSTIC FINDINGS:  Lumbar MRI 08/14/2021: IMPRESSION: 1. Lower lumbar degenerative change, greatest at L5-S1 where there is moderate facet arthropathy and mild bilateral foraminal stenosis. No significant canal stenosis. 2.  Cholelithiasis.   PATIENT SURVEYS:  Modified Oswestry 32/50    SCREENING FOR RED FLAGS: Negative   COGNITION: Overall cognitive status: Within functional limits for tasks assessed                          SENSATION: WFL   MUSCLE LENGTH: Hamstring grossly WFL but with pain at end range   POSTURE:  Patient with rounded shoulder and forward head posture   PALPATION: Tender to palpation bilateral lumbar paraspinal and midline lumbar  10/27/21: Lt anterior rotated innominate    LUMBAR ROM:    Active  AROM  eval 10/27/21  Flexion 75% WNL pain with rising to standing  Extension 50% 25% limited   Right lateral flexion 50% 25% limited  Left lateral flexion 50% 25% limited  Right rotation 50% 50% limited   Left rotation 50% 50% limited pain    LOWER EXTREMITY ROM:    LE ROM grossly WFL but with low back pain at end ranges   LOWER EXTREMITY MMT:     MMT Right eval Left eval 11/01/21  Hip flexion 4- 4-   Hip extension 3 3   Hip abduction 3 3 Lt: 4-/5; Rt: 4-/5   Knee flexion 5 4   Knee extension 5 4   Ankle dorsiflexion 5 5     Patient unable to hold 90-90 table top position due to pain and weakness   LUMBAR SPECIAL TESTS:  Radicular testing negative   GAIT: Assistive device utilized: None Level of assistance: Complete Independence Comments: patient with slow/hesitant gait after standing     TODAY'S TREATMENT  [facility] Adult PT Treatment:                                                DATE: 11/01/21 Therapeutic Exercise: Nustep level 4 x 5 minutes  Prone hip extension 2 x 10 singl Single knee to chest 2 x 30 sec  Sidelying hip abduction 2 x 10 bilateral  Supine posterior pelvic tilts 1 x 10  Attempted SLR with posterior pelvic tilt, unable to maintain core activation  TA march 2 x 10   Therapeutic Activity: Bed mobility focusing on log roll technique to reduce rotation about the spine and proper supine to sit transfer.   Modalities:  MHP lumbar x 10 minutes   [facility] Adult PT Treatment:                                                DATE: 10/27/21 Therapeutic Exercise: Seated figure 4 x 60 sec each (attempted in supine, unable due to pain)  Supine posterior pelvic tilts 2 x 10; 5 sec hold  Clamshells 2 x 15  Seated TA march 2 x 10  Seated pelvic tilts 2 x 10  Manual Therapy: STM to Lt lumbar paraspinals and gluteal musculature  Muscle energy technique to improve pelvic alignment Demonstrated and returned demo of  self-soft tissue mobilization with tennis ball to add as part of HEP  [facility] Adult PT Treatment:                                                DATE: 10/20/21 Therapeutic Exercise: Nustep L4 UE/LE x 5 minutes  Seated childs pose forward and lateral S/L Ql stretch SKTC using towel to assist 3 x 20 sec  Hooklying green band x 20 Clam  Banded bridge green   Manual Therapy: STW to right gluteals low back  Passive hamstring, hip IR, ER left  Modalities:  HMP lumbar x 15 minutes         PATIENT EDUCATION:  Education details: HEP Person educated: patient  Education method: Systems developer, cues Education comprehension: returned demo, cues    HOME EXERCISE PROGRAM: Access Code: [email or web address] URL: [email or web address] Date: 10/13/2021 Prepared by: Hessie Diener  Exercises - Supine Lower Trunk Rotation  - 2 x daily - 10 reps - 5 seconds hold - Hooklying Single Knee to Chest Stretch  - 2 x daily - 3 reps - 20 seconds hold - Bridge  - 2 x daily - 2 sets - 10 reps - Straight Leg Raise  - 2 x daily - 2 sets - 10 reps - Sidelying Hip Abduction  - 2 x daily - 2 sets - 10 reps - Seated Flexion Stretch with Swiss Ball  - 1 x daily - 7 x weekly - 1 sets - 5-10 reps - 10 hold - Seated Thoracic Flexion and Rotation with Swiss Ball  - 1 x daily - 7 x weekly - 1 sets - 3 reps - 10 hold     ASSESSMENT: CLINICAL IMPRESSION: Patient reports high pain levels upon arrival, though is in no obvious distress. She continues to have increases in  her back pain with positional changes on the mat. Time spent completing bed mobility focusing on maintaining neutral spine with rolling, which patient reports less pain when she is able to properly perform log roll technique. Continued with core stabilization with patient having difficulty maintaining TA activation with dynamic strengthening. Hip abductor strength has improved bilaterally compared to initial evaluation. No reports of pain at conclusion of session.    OBJECTIVE IMPAIRMENTS decreased activity tolerance, decreased ROM, decreased strength, postural dysfunction, and pain.    ACTIVITY LIMITATIONS carrying, lifting, bending, sitting, standing, and locomotion level   PARTICIPATION LIMITATIONS: meal prep, cleaning, laundry, shopping, community activity, and occupation   [place], Past/current experiences, and Time since onset of injury/illness/exacerbation are also affecting patient's functional outcome.    REHAB POTENTIAL: Good   CLINICAL DECISION MAKING: Stable/uncomplicated   EVALUATION COMPLEXITY: Low     GOALS: Goals reviewed with patient? Yes   SHORT TERM GOALS: Target date: 11/01/2021   Patient will be I with initial HEP in order to progress with therapy. Baseline: HEP provided at eval Goal status: INITIAL   2.  Patient will report pain level </= 6/10 in order to reduce functional limitations Baseline: 9/10 pain level Goal status: INITIAL   LONG TERM GOALS: Target date: 11/29/2021   Patient will be I with final HEP to maintain progress from PT. Baseline: HEP provided at eval Goal status: INITIAL   2.  Patient will report </=  20/50 on Modified ODI to indicate improved functional ability. Baseline: 32/50 Goal status: INITIAL   3.  Patient will exhibit gross core and hip strength >/= 4/5 MMT in order to improve standing and walking tolerance. Baseline: grossly 3/5 MMT, see above Goal status: INITIAL   4.  Patient will demonstrate a 25%  improvement in lumbar AROM in order to reduce pain and improve ability to perform work related tasks. Baseline: patient exhibits limitation in all lumbar AROM, see above Goal status: INITIAL     PLAN: PT FREQUENCY: 1x/week   PT DURATION: 8 weeks   PLANNED INTERVENTIONS: Therapeutic exercises, Therapeutic activity, Neuromuscular re-education, Balance training, Gait training, Patient/Family education, Joint manipulation, Joint mobilization, Aquatic Therapy, Dry Needling, Electrical stimulation, Spinal manipulation, Spinal mobilization, Cryotherapy, Moist heat, Taping, Traction, Manual therapy, and Re-evaluation.   PLAN FOR NEXT SESSION: Review HEP and progress PRN, stretching for lumbar and hip mobility, progress core and hip strength, progress to lifting and core control in upright position, consider TPDN to left lumbar; check STG       Gwendolyn Grant, PT, DPT, ATC 11/01/21 11:49 AM  Gwendolyn Grant, PT, DPT, ATC 12/29/21 2:41 PM

## 2021-11-26 ENCOUNTER — Ambulatory Visit: Payer: Self-pay | Admitting: Specialist

## 2022-01-09 ENCOUNTER — Other Ambulatory Visit: Payer: Self-pay

## 2022-01-09 ENCOUNTER — Encounter (HOSPITAL_COMMUNITY): Payer: Self-pay

## 2022-01-09 ENCOUNTER — Ambulatory Visit
Admission: EM | Admit: 2022-01-09 | Discharge: 2022-01-09 | Disposition: A | Discharge status: Home / Self Care | Payer: Self-pay

## 2022-01-09 ENCOUNTER — Emergency Department (HOSPITAL_COMMUNITY)
Admission: EM | Admit: 2022-01-09 | Discharge: 2022-01-09 | Disposition: A | Discharge status: Home / Self Care | Payer: Self-pay | Attending: Emergency Medicine | Admitting: Emergency Medicine

## 2022-01-09 ENCOUNTER — Emergency Department (HOSPITAL_COMMUNITY): Payer: Self-pay

## 2022-01-09 DIAGNOSIS — K8020 Calculus of gallbladder without cholecystitis without obstruction: Secondary | ICD-10-CM | POA: Insufficient documentation

## 2022-01-09 DIAGNOSIS — R109 Unspecified abdominal pain: Secondary | ICD-10-CM

## 2022-01-09 DIAGNOSIS — R1011 Right upper quadrant pain: Secondary | ICD-10-CM

## 2022-01-09 DIAGNOSIS — Z85528 Personal history of other malignant neoplasm of kidney: Secondary | ICD-10-CM | POA: Insufficient documentation

## 2022-01-09 DIAGNOSIS — N9489 Other specified conditions associated with female genital organs and menstrual cycle: Secondary | ICD-10-CM | POA: Insufficient documentation

## 2022-01-09 LAB — CBC
HCT: 39.6 % (ref 36.0–46.0)
Hemoglobin: 12.7 g/dL (ref 12.0–15.0)
MCH: 27.6 pg (ref 26.0–34.0)
MCHC: 32.1 g/dL (ref 30.0–36.0)
MCV: 86.1 fL (ref 80.0–100.0)
Platelets: 374 10*3/uL (ref 150–400)
RBC: 4.6 MIL/uL (ref 3.87–5.11)
RDW: 14.3 % (ref 11.5–15.5)
WBC: 8.6 10*3/uL (ref 4.0–10.5)
nRBC: 0 % (ref 0.0–0.2)

## 2022-01-09 LAB — COMPREHENSIVE METABOLIC PANEL
ALT: 33 U/L (ref 0–44)
AST: 30 U/L (ref 15–41)
Albumin: 3.6 g/dL (ref 3.5–5.0)
Alkaline Phosphatase: 58 U/L (ref 38–126)
Anion gap: 6 (ref 5–15)
BUN: 16 mg/dL (ref 6–20)
CO2: 23 mmol/L (ref 22–32)
Calcium: 8.9 mg/dL (ref 8.9–10.3)
Chloride: 108 mmol/L (ref 98–111)
Creatinine, Ser: 0.63 mg/dL (ref 0.44–1.00)
GFR, Estimated: >60 mL/min (ref >60)
Glucose, Bld: 98 mg/dL (ref 70–99)
Potassium: 3.8 mmol/L (ref 3.5–5.1)
Sodium: 137 mmol/L (ref 135–145)
Total Bilirubin: 0.4 mg/dL (ref 0.3–1.2)
Total Protein: 7 g/dL (ref 6.5–8.1)

## 2022-01-09 LAB — URINALYSIS, ROUTINE W REFLEX MICROSCOPIC
Bilirubin Urine: NEGATIVE
Glucose, UA: NEGATIVE mg/dL
Ketones, ur: NEGATIVE mg/dL
Leukocytes,Ua: NEGATIVE
Nitrite: NEGATIVE
Protein, ur: NEGATIVE mg/dL
Specific Gravity, Urine: 1.009 (ref 1.005–1.030)
pH: 6 (ref 5.0–8.0)

## 2022-01-09 LAB — LIPASE, BLOOD: Lipase: 37 U/L (ref 11–51)

## 2022-01-09 LAB — I-STAT BETA HCG BLOOD, ED (MC, WL, AP ONLY): I-stat hCG, quantitative: <5 m[IU]/mL (ref <5)

## 2022-01-09 MED ORDER — SUCRALFATE 1 GM/10ML PO SUSP: 1.0000 g | Freq: Three times a day (TID) | ORAL | Status: DC

## 2022-01-09 MED ORDER — FAMOTIDINE IN NACL 20-0.9 MG/50ML-% IV SOLN
20.0000 mg | Freq: Once | INTRAVENOUS | Status: AC
  Administered 2022-01-09: 20 mg via INTRAVENOUS
  Filled 2022-01-09: qty 50

## 2022-01-09 MED ORDER — KETOROLAC TROMETHAMINE 30 MG/ML IJ SOLN
15.0000 mg | Freq: Once | INTRAMUSCULAR | Status: AC
  Administered 2022-01-09: 15 mg via INTRAVENOUS
  Filled 2022-01-09: qty 1

## 2022-01-09 MED ORDER — FENTANYL CITRATE PF 50 MCG/ML IJ SOSY
25.0000 ug | PREFILLED_SYRINGE | Freq: Once | INTRAMUSCULAR | Status: AC
  Administered 2022-01-09: 25 ug via INTRAVENOUS
  Filled 2022-01-09: qty 1

## 2022-01-09 MED ORDER — ONDANSETRON HCL 4 MG/2ML IJ SOLN
4.0000 mg | Freq: Once | INTRAMUSCULAR | Status: AC
  Administered 2022-01-09: 4 mg via INTRAVENOUS
  Filled 2022-01-09: qty 2

## 2022-01-09 NOTE — ED Provider Notes (Signed)
[facility] EMERGENCY DEPARTMENT Provider Note   CSN: [id number] Arrival date & time: 01/09/22  1232     History  Chief Complaint  Patient presents with   Abdominal Pain    Mariah Lang is a 51 y.o. female.  HPI Patient presents with concern of abdominal pain began at 3 AM today, approximately 12 hours prior to ED arrival.  She was seen in urgent care, sent here for evaluation.  She has a history notable for left nephrectomy, hysterectomy, but still has her gallbladder and her appendix.  Her renal cancer was diagnosed 1 year ago, she notes that she has had clear follow-ups since that time.  She was otherwise well until her pain began earlier today.  Since that time she has had intermittent nausea, but no vomiting, no diarrhea, no dysuria.     Home Medications Prior to Admission medications   Medication Sig Start Date End Date Taking? Authorizing Provider  DULoxetine (CYMBALTA) 20 MG capsule Take 1 capsule (20 mg total) by mouth daily. 09/16/21   Jessy Oto, MD  methocarbamol (ROBAXIN) 500 MG tablet Take 1 tablet (500 mg total) by mouth every 8 (eight) hours as needed for muscle spasms. Patient not taking: Reported on 09/22/2021 06/28/21   Charlott Rakes, MD  ondansetron (ZOFRAN-ODT) 4 MG disintegrating tablet Dissolve 1 tablet (4 mg total) by mouth once every 8 (eight) hours as needed for nausea or vomiting. Patient not taking: Reported on 09/22/2021 09/15/21   Barrett Henle, MD  traMADol-acetaminophen (ULTRACET) 37.5-325 MG tablet Take 1 tablet by mouth every 6 (six) hours as needed. 10/28/21   Jessy Oto, MD      Allergies    Patient has no known allergies.    Review of Systems   Review of Systems  All other systems reviewed and are negative.   Physical Exam Updated Vital Signs BP 127/78   Pulse (!) 56   Temp 97.9 F (36.6 C)   Resp 16   LMP 01/18/2019   SpO2 98%  Physical Exam Vitals and nursing note reviewed.   Constitutional:      General: She is not in acute distress.    Appearance: She is well-developed.  HENT:     Head: Normocephalic and atraumatic.  Eyes:     Conjunctiva/sclera: Conjunctivae normal.  Cardiovascular:     Rate and Rhythm: Normal rate and regular rhythm.  Pulmonary:     Effort: Pulmonary effort is normal. No respiratory distress.     Breath sounds: Normal breath sounds. No stridor.  Abdominal:     General: There is no distension.     Tenderness: There is abdominal tenderness in the epigastric area.  Skin:    General: Skin is warm and dry.  Neurological:     Mental Status: She is alert and oriented to person, place, and time.     Cranial Nerves: No cranial nerve deficit.  Psychiatric:        Mood and Affect: Mood normal.     ED Results / Procedures / Treatments   Labs (all labs ordered are listed, but only abnormal results are displayed) Labs Reviewed  URINALYSIS, ROUTINE W REFLEX MICROSCOPIC - Abnormal; Notable for the following components:      Result Value   Color, Urine STRAW (*)    Hgb urine dipstick MODERATE (*)    Bacteria, UA RARE (*)    All other components within normal limits  LIPASE, BLOOD  COMPREHENSIVE METABOLIC PANEL  CBC  I-STAT BETA HCG BLOOD, ED ([facility], [facility], [facility] ONLY)    EKG None  Radiology US Abdomen Limited RUQ (LIVER/GB)  Result Date: 01/09/2022 CLINICAL DATA:  Abdominal pain.  Previous left nephrectomy. EXAM: ULTRASOUND ABDOMEN LIMITED RIGHT UPPER QUADRANT COMPARISON:  CT 08/24/2021 FINDINGS: Gallbladder: A large stone is present in the gallbladder measuring 2 cm diameter. No gallbladder wall thickening or edema. Murphy's sign is negative. Common bile duct: Diameter: 2 mm, normal Liver: No focal lesion identified. Within normal limits in parenchymal echogenicity. Portal vein is patent on color Doppler imaging with normal direction of blood flow towards the liver. Other: None. IMPRESSION: Cholelithiasis without evidence of acute  cholecystitis. Electronically Signed   By: Lucienne Capers M.D.   On: 01/09/2022 21:29    Procedures Procedures    Medications Ordered in ED Medications  sucralfate (CARAFATE) 1 GM/10ML suspension 1 g (has no administration in time range)  fentaNYL (SUBLIMAZE) injection 25 mcg (25 mcg Intravenous Given 01/09/22 1918)  ondansetron (ZOFRAN) injection 4 mg (4 mg Intravenous Given 01/09/22 1918)  famotidine (PEPCID) IVPB 20 mg premix (0 mg Intravenous Stopped 01/09/22 2022)  ketorolac (TORADOL) 30 MG/ML injection 15 mg (15 mg Intravenous Given 01/09/22 2149)    ED Course/ Medical Decision Making/ A&P This patient with a Hx of renal cancer, now status post nephrectomy, hysterectomy as well presents to the ED for concern of abdominal pain, nausea, this involves an extensive number of treatment options, and is a complaint that carries with it a high risk of complications and morbidity.    The differential diagnosis includes hepatobiliary dysfunction, cancer recurrence, acute gastroesophageal etiology   Social Determinants of Health:  Spanish-speaking  Additional history obtained:  Additional history and/or information obtained from chart review, notable for urgent care note from earlier in the day reviewed   After the initial evaluation, orders, including: Ultrasound labs antiemetics were initiated.   Patient placed on Cardiac and Pulse-Oximetry Monitors. The patient was maintained on a cardiac monitor.  The cardiac monitored showed an rhythm of 50 sinus normal aside from bradycardia The patient was also maintained on pulse oximetry. The readings were typically 100% room air normal   On repeat evaluation of the patient stayed the same  Lab Tests:  I personally interpreted labs.  The pertinent results include: Generally unremarkable labs aside from moderate amount of hemoglobin in the urine, no evidence for hepatic dysfunction  Imaging Studies ordered:  I independently visualized  and interpreted imaging which showed gallstones, no acute cholecystitis I agree with the radiologist interpretation  Consultations Obtained:  I requested consultation with the general surgery,  and discussed lab and imaging findings as well as pertinent plan - they recommend: If patient is asymptomatic, which she is currently, with no ongoing symptoms, no pain, she can follow-up safely as an outpatient.  Patient is amenable to this plan, we discussed at length, as well as return precautions.  Dispostion / Final MDM:  After consideration of the diagnostic results and the patient's response to treatment, female presents with 1 day of right upper quadrant abdominal pain, is found on ultrasound to have gallstones, but no evidence for acute cholecystitis.  Similarly reassuring the patient's absence of fever, or lab evidence for hepatobiliary dysfunction.  Hospitalization a consideration given the patient's severe pain initially.  Patient's symptoms resolved entirely, she was discharged in stable condition to follow-up as an outpatient.  Final Clinical Impression(s) / ED Diagnoses Final diagnoses:  Right upper quadrant abdominal pain  Calculus of gallbladder  without cholecystitis without obstruction     Carmin Muskrat, MD 01/09/22 2244

## 2022-01-09 NOTE — Discharge Instructions (Signed)
Please go to the emergency department as soon as you leave urgent care for further evaluation and management. ?

## 2022-01-09 NOTE — ED Provider Notes (Signed)
[facility]    CSN: [id number] Arrival date & time: 01/09/22  1015      History   Chief Complaint Chief Complaint  Patient presents with   Abdominal Pain    HPI Mariah Lang is a 51 y.o. female.   Patient presents with abdominal pain in the upper abdomen that started early this morning.  Patient reports it is a constant and severe pain that feels like "something needs to get out".  Pain is rated 9/10 on pain scale.  Denies nausea, vomiting, diarrhea, constipation.  Patient is having normal bowel movements and denies blood in stool.  Denies any fever or known sick contacts.  She denies any history of chronic gastrointestinal problems.  Patient does report that she had a "kidney surgery" in November 2022 where they also removed gynecological organs as she no longer has menstrual cycles.   Abdominal Pain   Past Medical History:  Diagnosis Date   Anemia     Patient Active Problem List   Diagnosis Date Noted   Hyponatremia 02/20/2021   Aspiration pneumonia of both lower lobes due to gastric secretions ([place]) 02/19/2021   Pulmonary embolism ([place]) 02/19/2021   Acute hypoxemic respiratory failure ([place]) 02/19/2021   Renal cell carcinoma ([place]) 02/17/2021   Ankle edema, bilateral 09/14/2020   Vitamin D deficiency 09/14/2020   Thrombocytosis 05/11/2019   Language barrier 12/31/2018   Screening breast examination 12/04/2018   Fibroid uterus 12/18/2014   Menorrhagia, premenopausal 11/24/2014   Iron deficiency anemia 11/24/2014   Iron deficiency anemia due to chronic blood loss 09/02/2014   Pap smear for cervical cancer screening 09/02/2014   Annual physical exam 09/02/2014   Dysmenorrhea 02/15/2013   Anemia due to Dysmenorrhea 02/15/2013    Past Surgical History:  Procedure Laterality Date   APPENDECTOMY     CESAREAN SECTION  2000   KIDNEY SURGERY     LAPAROSCOPIC NEPHRECTOMY, HAND ASSISTED Left 02/17/2021   Procedure: HAND ASSISTED  LAPAROSCOPIC RADICAL  NEPHRECTOMY;  Surgeon: Hollice Espy, MD;  Location: [facility];  Service: Urology;  Laterality: Left;   LAPAROTOMY  02/17/2021   Procedure: LAPAROTOMY OR HAND ASSIST;  Surgeon: Gillis Ends, MD;  Location: [facility];  Service: Gynecology;;  Fibroid excision    OTHER SURGICAL HISTORY  1993   c-section   PARTIAL HYSTERECTOMY     ROBOTIC ASSISTED LAPAROSCOPIC HYSTERECTOMY AND SALPINGECTOMY Bilateral 02/17/2021   Procedure: XI ROBOTIC ASSISTED LAPAROSCOPIC HYSTERECTOMY AND SALPINGECTOMY;  Surgeon: Gillis Ends, MD;  Location: [facility];  Service: Gynecology;  Laterality: Bilateral;    OB History     Gravida  2   Para  2   Term  2   Preterm  0   AB  0   Living         SAB  0   IAB  0   Ectopic  0   Multiple      Live Births               Home Medications    Prior to Admission medications   Medication Sig Start Date End Date Taking? Authorizing Provider  DULoxetine (CYMBALTA) 20 MG capsule Take 1 capsule (20 mg total) by mouth daily. 09/16/21   Jessy Oto, MD  methocarbamol (ROBAXIN) 500 MG tablet Take 1 tablet (500 mg total) by mouth every 8 (eight) hours as needed for muscle spasms. Patient not taking: Reported on 09/22/2021 06/28/21   Charlott Rakes, MD  ondansetron (ZOFRAN-ODT) 4  MG disintegrating tablet Dissolve 1 tablet (4 mg total) by mouth once every 8 (eight) hours as needed for nausea or vomiting. Patient not taking: Reported on 09/22/2021 09/15/21   Barrett Henle, MD  traMADol-acetaminophen (ULTRACET) 37.5-325 MG tablet Take 1 tablet by mouth every 6 (six) hours as needed. 10/28/21   Jessy Oto, MD    Family History Family History  Problem Relation Age of Onset   Migraines Mother    Hypertension Mother    Migraines Father    Hypertension Father    Diabetes Mother    Diabetes Paternal Grandmother     Social History Social History   Tobacco Use   Smoking status: Never    Passive exposure: Never    Smokeless tobacco: Never  Vaping Use   Vaping Use: Never used  Substance Use Topics   Alcohol use: Not Currently   Drug use: Not Currently     Allergies   Patient has no known allergies.   Review of Systems Review of Systems Per HPI  Physical Exam Triage Vital Signs ED Triage Vitals  Enc Vitals Group     BP 01/09/22 1121 139/81     Pulse Rate 01/09/22 1121 (!) 55     Resp 01/09/22 1121 16     Temp 01/09/22 1121 97.7 F (36.5 C)     Temp Source 01/09/22 1121 Oral     SpO2 01/09/22 1121 98 %     Weight --      Height --      Head Circumference --      Peak Flow --      Pain Score 01/09/22 1122 9     Pain Loc --      Pain Edu? --      Excl. in [place]? --    No data found.  Updated Vital Signs BP 139/81 (BP Location: Left Arm)   Pulse (!) 55   Temp 97.7 F (36.5 C) (Oral)   Resp 16   LMP 01/18/2019   SpO2 98%   Visual Acuity Right Eye Distance:   Left Eye Distance:   Bilateral Distance:    Right Eye Near:   Left Eye Near:    Bilateral Near:     Physical Exam Constitutional:      General: She is not in acute distress.    Appearance: Normal appearance. She is ill-appearing. She is not toxic-appearing or diaphoretic.  HENT:     Head: Normocephalic and atraumatic.  Eyes:     Extraocular Movements: Extraocular movements intact.     Conjunctiva/sclera: Conjunctivae normal.  Cardiovascular:     Rate and Rhythm: Normal rate and regular rhythm.     Pulses: Normal pulses.     Heart sounds: Normal heart sounds.  Pulmonary:     Effort: Pulmonary effort is normal. No respiratory distress.     Breath sounds: Normal breath sounds.  Abdominal:     General: Bowel sounds are normal. There is no distension.     Palpations: Abdomen is soft.     Tenderness: There is abdominal tenderness in the right upper quadrant.     Comments: Patient is significantly tender to right upper quadrant of abdomen.  Neurological:     General: No focal deficit present.     Mental  Status: She is alert and oriented to person, place, and time. Mental status is at baseline.  Psychiatric:        Mood and Affect: Mood normal.  Behavior: Behavior normal.        Thought Content: Thought content normal.        Judgment: Judgment normal.      UC Treatments / Results  Labs (all labs ordered are listed, but only abnormal results are displayed) Labs Reviewed - No data to display  EKG   Radiology No results found.  Procedures Procedures (including critical care time)  Medications Ordered in UC Medications - No data to display  Initial Impression / Assessment and Plan / UC Course  I have reviewed the triage vital signs and the nursing notes.  Pertinent labs & imaging results that were available during my care of the patient were reviewed by me and considered in my medical decision making (see chart for details).     Patient has sever abdominal pain with a sudden onset. Patient is also significantly tender to palpation in RUQ.  I do think that patient needs imaging of the abdomen which cannot be provided here at the urgent care.  Therefore, patient advised to go to the emergency department for further evaluation and management.  Patient was agreeable with plan. Vital signs and patient stable at discharge.  Agree with patient self transport to the hospital.  Interpreter used throughout patient interaction. Final Clinical Impressions(s) / UC Diagnoses   Final diagnoses:  Continuous severe abdominal pain     Discharge Instructions      Please go to the emergency department as soon as you leave urgent care for further evaluation and management.    ED Prescriptions   None    PDMP not reviewed this encounter.   Teodora Medici, [place] 01/09/22 1144

## 2022-01-09 NOTE — Discharge Instructions (Signed)
Please call our surgery clinic tomorrow and request an appointment due to your newly diagnosed gallstones.  Return to the Emergency Department for any concerning changes in your condition.   Llame a nuestra [facility] [place] y solicite una cita debido a sus clculos biliares recin diagnosticados.  Regrese al [employer] si presenta cualquier cambio preocupante en su condicin.

## 2022-01-09 NOTE — ED Notes (Signed)
Patient is being discharged from the Urgent Care and sent to the Emergency Department via self . Per Hildred Alamin, patient is in need of higher level of care due to abd pain. Patient is aware and verbalizes understanding of plan of care.  Vitals:   01/09/22 1121  BP: 139/81  Pulse: (!) 55  Resp: 16  Temp: 97.7 F (36.5 C)  SpO2: 98%

## 2022-01-09 NOTE — ED Triage Notes (Signed)
Patient complains of upper abdominal pain above umbilicus today at 4461. Sent from urgent care for further evaluation. No nausea, no vomiting, no diarrhea. Denies dysuria

## 2022-01-09 NOTE — ED Triage Notes (Signed)
Pt c/o upper abd pain, onset ~ 3am.

## 2022-01-09 NOTE — ED Notes (Signed)
Patient transported to Ultrasound 

## 2022-03-17 ENCOUNTER — Ambulatory Visit
Admission: RE | Admit: 2022-03-17 | Discharge: 2022-03-17 | Disposition: A | Discharge status: Home / Self Care | Payer: Self-pay | Source: Ambulatory Visit | Attending: Urology | Admitting: Urology

## 2022-03-17 DIAGNOSIS — C642 Malignant neoplasm of left kidney, except renal pelvis: Secondary | ICD-10-CM | POA: Insufficient documentation

## 2022-03-17 HISTORY — DX: Malignant (primary) neoplasm, unspecified: C80.1

## 2022-03-17 LAB — POCT I-STAT CREATININE: Creatinine, Ser: 0.7 mg/dL (ref 0.44–1.00)

## 2022-03-17 MED ORDER — IOHEXOL 300 MG/ML  SOLN
100.0000 mL | Freq: Once | INTRAMUSCULAR | Status: AC | PRN
  Administered 2022-03-17: 100 mL via INTRAVENOUS

## 2022-03-23 ENCOUNTER — Ambulatory Visit
Admission: RE | Admit: 2022-03-23 | Discharge: 2022-03-23 | Disposition: A | Discharge status: Home / Self Care | Payer: Self-pay | Source: Ambulatory Visit | Attending: Urology | Admitting: Urology

## 2022-03-23 ENCOUNTER — Ambulatory Visit (INDEPENDENT_AMBULATORY_CARE_PROVIDER_SITE_OTHER): Payer: Self-pay | Admitting: Urology

## 2022-03-23 VITALS — BP 116/67 | HR 73 | Ht 59.06 in | Wt 200.0 lb

## 2022-03-23 DIAGNOSIS — C642 Malignant neoplasm of left kidney, except renal pelvis: Secondary | ICD-10-CM

## 2022-03-23 DIAGNOSIS — Z85528 Personal history of other malignant neoplasm of kidney: Secondary | ICD-10-CM

## 2022-03-23 NOTE — Progress Notes (Signed)
I, DeAsia L Maxie,acting as a scribe for Hollice Espy, MD.,have documented all relevant documentation on the behalf of Hollice Espy, MD,as directed by  Hollice Espy, MD while in the presence of Hollice Espy, MD.   03/23/22 11:18 AM   [place] 1971-01-21 [id number]  Chief Complaint  Patient presents with   Follow-up    Discuss results    HPI: 51 year-old female who is status post left nephrectomy for 10 cm renal mass, here for follow-up.   She is s/p hand-assisted laparoscopic left radical nephrectomy with me and total hysterectomy, minilaparotomy, myomectomy with Dr.Secord on 02/17/2021. 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   Post op course complicated by nonobstructing PE currently on eliquis x 3 months.  She underwent a CT abdomen/pelvis on 03/18/22 which showed no evidence of recurrent or metastatic carcinoma within the abdomen or pelvis.  She presented to the urgent care in October with stomach pain. Ultrasound at that time was unremarkable. No further issues at this time.   PMH: Past Medical History:  Diagnosis Date   Anemia    Cancer [facility])     Surgical History: Past Surgical History:  Procedure Laterality Date   APPENDECTOMY     CESAREAN SECTION  2000   KIDNEY SURGERY     LAPAROSCOPIC NEPHRECTOMY, HAND ASSISTED Left 02/17/2021   Procedure: HAND ASSISTED LAPAROSCOPIC RADICAL  NEPHRECTOMY;  Surgeon: Hollice Espy, MD;  Location: [facility];  Service: Urology;  Laterality: Left;   LAPAROTOMY  02/17/2021   Procedure: LAPAROTOMY OR HAND ASSIST;  Surgeon: Gillis Ends, MD;  Location: [facility];  Service: Gynecology;;  Fibroid excision    OTHER SURGICAL HISTORY  1993   c-section   PARTIAL HYSTERECTOMY     ROBOTIC ASSISTED LAPAROSCOPIC HYSTERECTOMY AND SALPINGECTOMY Bilateral 02/17/2021   Procedure: XI ROBOTIC ASSISTED LAPAROSCOPIC HYSTERECTOMY AND SALPINGECTOMY;  Surgeon: Gillis Ends, MD;  Location: [facility];  Service: Gynecology;  Laterality: Bilateral;    Home Medications:  Allergies as of 03/23/2022   No Known Allergies      Medication List        Accurate as of March 23, 2022 11:18 AM. If you have any questions, ask your nurse or doctor.          STOP taking these medications    DULoxetine 20 MG capsule Commonly known as: Cymbalta Stopped by: Hollice Espy, MD   methocarbamol 500 MG tablet Commonly known as: ROBAXIN Stopped by: Hollice Espy, MD   ondansetron 4 MG disintegrating tablet Commonly known as: ZOFRAN-ODT Stopped by: Hollice Espy, MD   traMADol-acetaminophen 37.5-325 MG tablet Commonly known as: ULTRACET Stopped by: Hollice Espy, MD        Family History: Family History  Problem Relation Age of Onset   Migraines Mother    Hypertension Mother    Migraines Father    Hypertension Father    Diabetes Mother    Diabetes Paternal Grandmother     Social History:  reports that she has never smoked. She has never been exposed to tobacco smoke. She has never used smokeless tobacco. She reports that she does not currently use alcohol. She reports that she does not currently use drugs.   Physical Exam: BP 116/67   Pulse 73   Ht 4' 11.06" (1.5 m)   Wt 200 lb (90.7 kg)   LMP 01/18/2019   BMI 40.31 kg/m   Constitutional:  Alert and oriented, No acute distress. HEENT: [place] AT,  moist mucus membranes.  Trachea midline, no masses. Abdomen: Incisions well-healed without hernia. Neurologic: Grossly intact, no focal deficits, moving all 4 extremities. Psychiatric: Normal mood and affect.  Pertinent Imaging: EXAM: CT ABDOMEN AND PELVIS WITH CONTRAST   TECHNIQUE: Multidetector CT imaging of the abdomen and pelvis was performed using the standard protocol following bolus administration of intravenous contrast.   RADIATION DOSE REDUCTION: This exam was performed according to the departmental  dose-optimization program which includes automated exposure control, adjustment of the mA and/or kV according to patient size and/or use of iterative reconstruction technique.   CONTRAST:  139m OMNIPAQUE IOHEXOL 300 MG/ML  SOLN   COMPARISON:  08/24/2021   FINDINGS: Lower Chest: No acute findings.   Hepatobiliary: No hepatic masses identified. Mild diffuse hepatic steatosis again noted. Gallstones are seen, however there is no evidence of cholecystitis or biliary dilatation.   Pancreas:  No mass or inflammatory changes.   Spleen: Within normal limits in size and appearance.   Adrenals/Urinary Tract: Normal adrenal glands and right kidney. Stable postop changes from prior left nephrectomy. No soft tissue mass identified within the surgical bed. Unremarkable unopacified urinary bladder.   Stomach/Bowel: No evidence of obstruction, inflammatory process or abnormal fluid collections.   Vascular/Lymphatic: No pathologically enlarged lymph nodes. No acute vascular findings.   Reproductive: Prior hysterectomy noted. Adnexal regions are unremarkable in appearance.   Other:  None.   Musculoskeletal:  No suspicious bone lesions identified.   IMPRESSION: Stable postop changes from prior left nephrectomy. No evidence of recurrent or metastatic carcinoma within the abdomen or pelvis.   Mild hepatic steatosis.   Cholelithiasis. No radiographic evidence of cholecystitis.     Electronically Signed   By: JMarlaine HindM.D.   On: 03/18/2022 14:29   EXAM: ULTRASOUND ABDOMEN LIMITED RIGHT UPPER QUADRANT   COMPARISON:  CT 08/24/2021   FINDINGS: Gallbladder:   A large stone is present in the gallbladder measuring 2 cm diameter. No gallbladder wall thickening or edema. Murphy's sign is negative.   Common bile duct:   Diameter: 2 mm, normal   Liver:   No focal lesion identified. Within normal limits in parenchymal echogenicity. Portal vein is patent on color Doppler  imaging with normal direction of blood flow towards the liver.   Other: None.   IMPRESSION: Cholelithiasis without evidence of acute cholecystitis.     Electronically Signed   By: WLucienne CapersM.D.   On: 01/09/2022 21:29  Personally reviewed and agree with the radiologic interpretation.   Assessment & Plan:    Chromophobe Renal cell carcinoma  - S/p hand-assisted laparoscopic left radical nephrectomy - No evidence of disease, stage III - We will continue to need continue 681-monthnterval CT abdomen for total of 3 years and then annually as well as chest imaging -CXR today   Return in about 6 months (around 09/22/2022). [id number] AvenueSuConneaut LakeuBrowns Point[place] [phone] I have reviewed the above documentation for accuracy and completeness, and I agree with the above.   AsHollice EspyMD

## 2022-04-13 ENCOUNTER — Ambulatory Visit: Payer: Self-pay

## 2022-07-04 ENCOUNTER — Ambulatory Visit
Admission: EM | Admit: 2022-07-04 | Discharge: 2022-07-04 | Disposition: A | Discharge status: Home / Self Care | Payer: Self-pay | Attending: Internal Medicine | Admitting: Internal Medicine

## 2022-07-04 ENCOUNTER — Other Ambulatory Visit: Payer: Self-pay

## 2022-07-04 DIAGNOSIS — Z1152 Encounter for screening for COVID-19: Secondary | ICD-10-CM | POA: Insufficient documentation

## 2022-07-04 DIAGNOSIS — B309 Viral conjunctivitis, unspecified: Secondary | ICD-10-CM

## 2022-07-04 DIAGNOSIS — J069 Acute upper respiratory infection, unspecified: Secondary | ICD-10-CM

## 2022-07-04 MED ORDER — CETIRIZINE HCL 10 MG PO TABS
10.0000 mg | ORAL_TABLET | Freq: Every day | ORAL | 0 refills | Status: DC
  Filled 2022-07-04: qty 30, 30d supply, fill #0

## 2022-07-04 MED ORDER — FLUTICASONE PROPIONATE 50 MCG/ACT NA SUSP
1.0000 | Freq: Every day | NASAL | 0 refills | Status: DC
  Filled 2022-07-04: qty 16, 25d supply, fill #0

## 2022-07-04 NOTE — Discharge Instructions (Signed)
It appears that you have a viral illness that should run its course as discussed.  I have prescribed 2 medications which I do think will be helpful.  Ensure adequate fluid hydration and rest.  You have viral conjunctivitis which is an inflammation of your eyes which should resolve once other symptoms resolve.  If any symptoms persist or worsen, please follow-up further evaluation.

## 2022-07-04 NOTE — ED Provider Notes (Addendum)
[facility]    CSN: [id number] Arrival date & time: 07/04/22  1406      History   Chief Complaint Chief Complaint  Patient presents with   Fever    HPI Mariah Lang is a 52 y.o. female.   Patient presents with runny nose, chills, sore throat, fatigue that all started about 4 to 5 days ago.  Patient reports sore throat has now resolved.  She denies cough or any documented fever.  Denies any known sick contacts.  Patient reports that she also has eye redness and irritation with watery drainage that started upon awakening this morning.  Denies purulent drainage or crustiness.  Denies trauma or foreign body to the eye.  Patient does not wear any corrective lenses.  Denies blurry vision.  Patient has taken over-the-counter cold and flu medications with minimal improvement in symptoms.  Patient not reporting any chest pain or shortness of breath.   Fever   Past Medical History:  Diagnosis Date   Anemia    Cancer     Patient Active Problem List   Diagnosis Date Noted   Hyponatremia 02/20/2021   Aspiration pneumonia of both lower lobes due to gastric secretions 02/19/2021   Pulmonary embolism 02/19/2021   Acute hypoxemic respiratory failure 02/19/2021   Renal cell carcinoma 02/17/2021   Ankle edema, bilateral 09/14/2020   Vitamin D deficiency 09/14/2020   Thrombocytosis 05/11/2019   Language barrier 12/31/2018   Screening breast examination 12/04/2018   Fibroid uterus 12/18/2014   Menorrhagia, premenopausal 11/24/2014   Iron deficiency anemia 11/24/2014   Iron deficiency anemia due to chronic blood loss 09/02/2014   Pap smear for cervical cancer screening 09/02/2014   Annual physical exam 09/02/2014   Dysmenorrhea 02/15/2013   Anemia due to Dysmenorrhea 02/15/2013    Past Surgical History:  Procedure Laterality Date   APPENDECTOMY     CESAREAN SECTION  2000   KIDNEY SURGERY     LAPAROSCOPIC NEPHRECTOMY, HAND ASSISTED Left 02/17/2021    Procedure: HAND ASSISTED LAPAROSCOPIC RADICAL  NEPHRECTOMY;  Surgeon: Hollice Espy, MD;  Location: [facility];  Service: Urology;  Laterality: Left;   LAPAROTOMY  02/17/2021   Procedure: LAPAROTOMY OR HAND ASSIST;  Surgeon: Gillis Ends, MD;  Location: [facility];  Service: Gynecology;;  Fibroid excision    OTHER SURGICAL HISTORY  1993   c-section   PARTIAL HYSTERECTOMY     ROBOTIC ASSISTED LAPAROSCOPIC HYSTERECTOMY AND SALPINGECTOMY Bilateral 02/17/2021   Procedure: XI ROBOTIC ASSISTED LAPAROSCOPIC HYSTERECTOMY AND SALPINGECTOMY;  Surgeon: Gillis Ends, MD;  Location: [facility];  Service: Gynecology;  Laterality: Bilateral;    OB History     Gravida  2   Para  2   Term  2   Preterm  0   AB  0   Living         SAB  0   IAB  0   Ectopic  0   Multiple      Live Births               Home Medications    Prior to Admission medications   Medication Sig Start Date End Date Taking? Authorizing Provider  cetirizine (ZYRTEC) 10 MG tablet Take 1 tablet (10 mg total) by mouth daily. 07/04/22  Yes Izabell Schalk, Hildred Alamin E, FNP  fluticasone (FLONASE) 50 MCG/ACT nasal spray Place 1 spray into both nostrils daily. 07/04/22  Yes Teodora Medici, FNP    Family History Family History  Problem  Relation Age of Onset   Migraines Mother    Hypertension Mother    Migraines Father    Hypertension Father    Diabetes Mother    Diabetes Paternal Grandmother     Social History Social History   Tobacco Use   Smoking status: Never    Passive exposure: Never   Smokeless tobacco: Never  Vaping Use   Vaping Use: Never used  Substance Use Topics   Alcohol use: Not Currently   Drug use: Not Currently     Allergies   Patient has no known allergies.   Review of Systems Review of Systems Per HPI  Physical Exam Triage Vital Signs ED Triage Vitals  Enc Vitals Group     BP 07/04/22 1458 126/75     Pulse Rate 07/04/22 1458 76     Resp 07/04/22 1458 16     Temp  07/04/22 1458 97.8 F (36.6 C)     Temp Source 07/04/22 1458 Oral     SpO2 07/04/22 1458 95 %     Weight --      Height --      Head Circumference --      Peak Flow --      Pain Score 07/04/22 1456 0     Pain Loc --      Pain Edu? --      Excl. in [place]? --    No data found.  Updated Vital Signs BP 126/75 (BP Location: Left Arm)   Pulse 76   Temp 97.8 F (36.6 C) (Oral)   Resp 16   LMP 01/18/2019   SpO2 95%   Visual Acuity Right Eye Distance:   Left Eye Distance:   Bilateral Distance:    Right Eye Near:   Left Eye Near:    Bilateral Near:     Physical Exam Constitutional:      General: She is not in acute distress.    Appearance: Normal appearance. She is not toxic-appearing or diaphoretic.  HENT:     Head: Normocephalic and atraumatic.     Right Ear: Ear canal normal. A middle ear effusion is present. Tympanic membrane is not perforated, erythematous or bulging.     Left Ear: Ear canal normal. A middle ear effusion is present. Tympanic membrane is not perforated, erythematous or bulging.     Nose: Congestion present.     Mouth/Throat:     Mouth: Mucous membranes are moist.     Pharynx: No posterior oropharyngeal erythema.  Eyes:     General: Lids are normal. Lids are everted, no foreign bodies appreciated. Vision grossly intact. Gaze aligned appropriately.     Extraocular Movements: Extraocular movements intact.     Conjunctiva/sclera:     Right eye: Right conjunctiva is injected. No chemosis, exudate or hemorrhage.    Left eye: Left conjunctiva is injected. No chemosis, exudate or hemorrhage.    Pupils: Pupils are equal, round, and reactive to light.     Comments: Very minimal inflammation and redness to the conjunctiva bilaterally.  No drainage noted.  Cardiovascular:     Rate and Rhythm: Normal rate and regular rhythm.     Pulses: Normal pulses.     Heart sounds: Normal heart sounds.  Pulmonary:     Effort: Pulmonary effort is normal. No respiratory  distress.     Breath sounds: Normal breath sounds. No stridor. No wheezing, rhonchi or rales.  Abdominal:     General: Abdomen is flat. Bowel sounds are normal.  Palpations: Abdomen is soft.  Musculoskeletal:        General: Normal range of motion.     Cervical back: Normal range of motion.  Skin:    General: Skin is warm and dry.  Neurological:     General: No focal deficit present.     Mental Status: She is alert and oriented to person, place, and time. Mental status is at baseline.  Psychiatric:        Mood and Affect: Mood normal.        Behavior: Behavior normal.      UC Treatments / Results  Labs (all labs ordered are listed, but only abnormal results are displayed) Labs Reviewed  SARS CORONAVIRUS 2 (TAT 6-24 HRS)    EKG   Radiology No results found.  Procedures Procedures (including critical care time)  Medications Ordered in UC Medications - No data to display  Initial Impression / Assessment and Plan / UC Course  I have reviewed the triage vital signs and the nursing notes.  Pertinent labs & imaging results that were available during my care of the patient were reviewed by me and considered in my medical decision making (see chart for details).     Patient presents with symptoms likely from a viral upper respiratory infection.  Do not suspect underlying cardiopulmonary process. Symptoms seem unlikely related to ACS, CHF or COPD exacerbations, pneumonia, pneumothorax. Patient is nontoxic appearing and not in need of emergent medical intervention.  COVID test pending.  Flu testing deferred given duration of symptoms as it would not change treatment.  Recommended symptom control with medications and supportive care.  Patient was sent prescriptions.  She denies that she takes any daily medications so these medications should be safe.  Creatinine clearance 153 so no dosage assessment necessary despite patient's history of only having 1 kidney.  Patient most  likely has viral conjunctivitis bilaterally.  No signs of trauma or foreign body to the eye or any bacterial infection.  Visual acuity appears normal as well.  Return if symptoms fail to improve. Patient states understanding and is agreeable.  Discharged with PCP followup.  Final Clinical Impressions(s) / UC Diagnoses   Final diagnoses:  Viral upper respiratory infection  Viral conjunctivitis, both eyes     Discharge Instructions      It appears that you have a viral illness that should run its course as discussed.  I have prescribed 2 medications which I do think will be helpful.  Ensure adequate fluid hydration and rest.  You have viral conjunctivitis which is an inflammation of your eyes which should resolve once other symptoms resolve.  If any symptoms persist or worsen, please follow-up further evaluation.    ED Prescriptions     Medication Sig Dispense Auth. Provider   cetirizine (ZYRTEC) 10 MG tablet Take 1 tablet (10 mg total) by mouth daily. 30 tablet [place], Cosby E, [place]   fluticasone [facility]) 50 MCG/ACT nasal spray Place 1 spray into both nostrils daily. 16 g Teodora Medici, [place]      PDMP not reviewed this encounter.   Teodora Medici, [place] 07/04/22 [place], [place], [place] 07/04/22 1606

## 2022-07-04 NOTE — ED Triage Notes (Signed)
Pt c/o fever, eyes turning red, chills, fatigue   Onset ~ Thursday   Denies pain

## 2022-07-05 LAB — SARS CORONAVIRUS 2 (TAT 6-24 HRS): SARS Coronavirus 2: NEGATIVE

## 2022-08-01 ENCOUNTER — Ambulatory Visit: Payer: Self-pay | Attending: Family Medicine | Admitting: Family Medicine

## 2022-08-01 ENCOUNTER — Encounter: Payer: Self-pay | Admitting: Family Medicine

## 2022-08-01 VITALS — BP 114/72 | HR 83 | Ht 59.0 in | Wt 193.0 lb

## 2022-08-01 DIAGNOSIS — C642 Malignant neoplasm of left kidney, except renal pelvis: Secondary | ICD-10-CM

## 2022-08-01 DIAGNOSIS — Z1211 Encounter for screening for malignant neoplasm of colon: Secondary | ICD-10-CM

## 2022-08-01 DIAGNOSIS — Z1231 Encounter for screening mammogram for malignant neoplasm of breast: Secondary | ICD-10-CM

## 2022-08-01 DIAGNOSIS — R7303 Prediabetes: Secondary | ICD-10-CM

## 2022-08-01 LAB — POCT GLYCOSYLATED HEMOGLOBIN (HGB A1C): HbA1c, POC (prediabetic range): 6 % (ref 5.7–6.4)

## 2022-08-01 NOTE — Patient Instructions (Signed)
Plan de alimentacin para personas con prediabetes Prediabetes Eating Plan La prediabetes es una afeccin que hace que los niveles de azcar en la sangre (glucosa) sean ms altos de lo normal. Esto aumenta el riesgo de desarrollar diabetes tipo 2 (diabetes mellitus tipo 2). Trabajar con un profesional de la salud o especialista en nutricin (nutricionista) para hacer cambios en la dieta y el estilo de vida puede ayudar a prevenir el inicio de la diabetes. Estos cambios pueden ayudarlo a: Controlar los niveles de glucemia. Mejorar los niveles de colesterol. Controlar la presin arterial. Consejos para seguir este plan Al leer las etiquetas de los alimentos Lea las etiquetas de los alimentos envasados para controlar la cantidad de grasa, sal (sodio) y azcar que contienen. Evite los alimentos que contengan lo siguiente: Grasas saturadas. Grasas trans. Azcares agregados. Evite los alimentos que contengan ms de 300 miligramos (mg) de sodio por porcin. Limite el consumo de sodio a menos de 2300 mg por da. Al ir de compras Evite comprar alimentos procesados y preelaborados. Evite comprar bebidas con azcar agregada. Al cocinar Cocine con aceite de oliva. No use mantequilla, manteca de cerdo ni mantequilla clarificada. Cocine los alimentos al horno, a la parrilla, asados, al vapor o hervidos. Evite frerlos. Planificacin de las comidas  Trabaje con el nutricionista para crear un plan de alimentacin que sea adecuado para usted. Esto puede incluir el seguimiento de cuntas caloras ingiere al da. Use un registro de alimentos, un cuaderno o una aplicacin mvil para anotar lo que comi en cada comida. Considere la posibilidad de seguir una dieta mediterrnea. Esta puede comprender lo siguiente: Comer varias porciones de frutas y verduras frescas por da. Pescado al menos dos veces por semana. Comer una porcin de cereales integrales, frijoles, frutos secos y semillas por da. Aceite de oliva  en lugar de otras grasas. Limitar el consumo de alcohol. Limitar la carne roja. Usar productos lcteos descremados o con bajo contenido de grasa. Considerar seguir una dieta a base de vegetales. Esta incluye hacer elecciones alimentarias que se concentren en comer principalmente verduras y frutas, cereales, frijoles, frutos secos y semillas. Si tiene hipertensin arterial, quizs deba limitar el consumo de sodio o seguir una dieta como el plan de alimentacin basado en los Enfoques Alimentarios para Detener la Hipertensin (Dietary Approaches to Stop Hypertension, DASH). La dieta DASH tiene como objetivo bajar la hipertensin arterial. Estilo de vida Establezca metas para bajar de peso con la ayuda de su equipo de atencin mdica. A la mayora de las personas con prediabetes se les recomienda bajar un 7 % de su peso corporal. Haga al menos 30 minutos de ejercicio, 5 o ms das a la semana. Asista a un grupo de apoyo o solicite el apoyo de un consejero de salud mental. Use los medicamentos de venta libre y los recetados solamente como se lo haya indicado el mdico. Qu alimentos se recomiendan? Frutas Bayas. Bananas. Manzanas. Naranjas. Uvas. Papaya. Mango. Granada. Kiwi. Pomelo. Cerezas. Verduras Lechuga. Espinaca. Guisantes. Remolachas. Coliflor. Repollo. Brcoli. Zanahorias. Tomates. Calabaza. Berenjena. Hierbas. Pimientos. Cebollas. Pepinos. Coles de Bruselas. Granos Productos integrales, como panes, galletas, cereales y pastas de salvado o integrales. Avena sin azcar. Trigo burgol. Cebada. Quinua. Arroz integral. Tacos o tortillas de harina de maz o de salvado. Carnes y otras protenas Mariscos. Carne de ave sin piel. Cortes magros de cerdo y carne de res. Tofu. Huevos. Frutos secos. Frijoles. Lcteos Productos lcteos descremados o semidescremados, como yogur, queso cottage y queso. Bebidas Agua. T. Caf. Gaseosas sin azcar   o dietticas. Soda. Leche descremada o con bajo contenido de  grasa. Productos alternativos a la leche, como leche de soja o de almendras. Grasas y aceites Aceite de oliva. Aceite de canola. Aceite de girasol. Aceite de semillas de uva. Aguacate. Nueces. Dulces y postres Pudin sin azcar o con bajo contenido de grasa. Helado y otros postres congelados sin azcar o con bajo contenido de grasa. Alios y condimentos Hierbas. Especias sin sodio. Mostaza. Salsa de pepinillos. Ktchup con bajo contenido de sal y de azcar. Salsa barbacoa con bajo contenido de sal y de azcar. Mayonesa con bajo contenido de grasa o sin grasa. Es posible que los productos mencionados arriba no formen una lista completa de las bebidas o los alimentos recomendados. Consulte a un nutricionista para obtener ms informacin. Qu alimentos no se recomiendan? Frutas Frutas enlatadas al almbar. Verduras Verduras enlatadas. Verduras congeladas con mantequilla o salsa de crema. Granos Productos elaborados con harina y harina blanca refinada, como panes, pastas, bocadillos y cereales. Carnes y otras protenas Cortes de carne con alto contenido de grasa. Carne de ave con piel. Carne empanizada o frita. Carnes procesadas. Lcteos Yogur, queso o leche enteros. Bebidas Bebidas azucaradas, como t helado y gaseosas. Grasas y aceites Mantequilla. Manteca de cerdo. Mantequilla clarificada. Dulces y postres Productos horneados, como pasteles, pastelitos, galletas dulces y tarta de queso. Alios y condimentos Mezclas de especias con sal agregada. Ktchup. Salsa barbacoa. Mayonesa. Es posible que los productos que se enumeran ms arriba no sean una lista completa de los alimentos y las bebidas que no se recomiendan. Consulte a un nutricionista para obtener ms informacin. Dnde buscar ms informacin American Diabetes Association (Asociacin Estadounidense de la Diabetes): www.diabetes.org Resumen Es posible que deba hacer cambios en la dieta y el estilo de vida para ayudar a prevenir el  inicio de la diabetes. Estos cambios pueden ayudarlo a controlar el azcar en la sangre, mejorar los niveles de colesterol y controlar la presin arterial. Establezca metas para bajar de peso con la ayuda de su equipo de atencin mdica. A la mayora de las personas con prediabetes se les recomienda bajar un 7 % de su peso corporal. Considere la posibilidad de seguir una dieta mediterrnea. Esto incluye comer muchas frutas y verduras frescas, cereales integrales, frijoles, frutos secos, semillas, pescado y productos lcteos con bajo contenido de grasa, y usar aceite de oliva en lugar de otras grasas. Esta informacin no tiene como fin reemplazar el consejo del mdico. Asegrese de hacerle al mdico cualquier pregunta que tenga. Document Revised: 09/17/2019 Document Reviewed: 09/17/2019 Elsevier Patient Education  2023 Elsevier Inc.  

## 2022-08-01 NOTE — Progress Notes (Signed)
Subjective:  Patient ID: Mariah Lang, female    DOB: 1971-02-26  Age: 52 y.o. MRN: [id number]  CC: Hospitalization Follow-up   HPI Mariah Lang is a 52 y.o. year old female with a history of plantar fasciitis, renal cell carcinoma (status post laparoscopic left radical nephrectomy) s/p  hysterectomy for uterine fibroids, pulmonary embolism (diagnosed in 02/2021), prediabetes.  Interval History: 3 weeks ago she had an ED visit for viral URI treated with Flonase and Zyrtec. Her symptoms  have resolved.  Last Urology visit was in 03/2022 and per notes no evidence of disease. She has a follow up next month as well as repeat CT for surveillance. Denies presence of additional concerns today. Past Medical History:  Diagnosis Date   Anemia    Cancer [facility])     Past Surgical History:  Procedure Laterality Date   APPENDECTOMY     CESAREAN SECTION  2000   KIDNEY SURGERY     LAPAROSCOPIC NEPHRECTOMY, HAND ASSISTED Left 02/17/2021   Procedure: HAND ASSISTED LAPAROSCOPIC RADICAL  NEPHRECTOMY;  Surgeon: Vanna Scotland, MD;  Location: [facility];  Service: Urology;  Laterality: Left;   LAPAROTOMY  02/17/2021   Procedure: LAPAROTOMY OR HAND ASSIST;  Surgeon: Artelia Laroche, MD;  Location: [facility];  Service: Gynecology;;  Fibroid excision    OTHER SURGICAL HISTORY  1993   c-section   PARTIAL HYSTERECTOMY     ROBOTIC ASSISTED LAPAROSCOPIC HYSTERECTOMY AND SALPINGECTOMY Bilateral 02/17/2021   Procedure: XI ROBOTIC ASSISTED LAPAROSCOPIC HYSTERECTOMY AND SALPINGECTOMY;  Surgeon: Artelia Laroche, MD;  Location: [facility];  Service: Gynecology;  Laterality: Bilateral;    Family History  Problem Relation Age of Onset   Migraines Mother    Hypertension Mother    Migraines Father    Hypertension Father    Diabetes Mother    Diabetes Paternal Grandmother     Social History   Socioeconomic History   Marital status: Divorced    Spouse  name: Not on file   Number of children: 2   Years of education: Not on file   Highest education level: 9th grade  Occupational History   Not on file  Tobacco Use   Smoking status: Never    Passive exposure: Never   Smokeless tobacco: Never  Vaping Use   Vaping Use: Never used  Substance and Sexual Activity   Alcohol use: Not Currently   Drug use: Not Currently   Sexual activity: Yes    Birth control/protection: Surgical, None  Other Topics Concern   Not on file  Social History Narrative   ** Merged History Encounter **       ** Merged History Encounter **       Social Determinants of Health   Financial Resource Strain: Not on file  Food Insecurity: Not on file  Transportation Needs: No Transportation Needs (12/04/2018)   PRAPARE - Administrator, Civil Service (Medical): No    Lack of Transportation (Non-Medical): No  Physical Activity: Not on file  Stress: Not on file  Social Connections: Not on file    No Known Allergies  Outpatient Medications Prior to Visit  Medication Sig Dispense Refill   cetirizine (ZYRTEC) 10 MG tablet Take 1 tablet (10 mg total) by mouth daily. (Patient not taking: Reported on 08/01/2022) 30 tablet 0   fluticasone (FLONASE) 50 MCG/ACT nasal spray Place 1 spray into both nostrils daily. (Patient not taking: Reported on 08/01/2022) 16 g 0   No facility-administered  medications prior to visit.     ROS Review of Systems  Constitutional:  Negative for activity change and appetite change.  HENT:  Negative for sinus pressure and sore throat.   Respiratory:  Negative for chest tightness, shortness of breath and wheezing.   Cardiovascular:  Negative for chest pain and palpitations.  Gastrointestinal:  Negative for abdominal distention, abdominal pain and constipation.  Genitourinary: Negative.   Musculoskeletal: Negative.   Psychiatric/Behavioral:  Negative for behavioral problems and dysphoric mood.     Objective:  BP 114/72    Pulse 83   Ht 4\' 11"  (1.499 m)   Wt 193 lb (87.5 kg)   LMP 01/18/2019   SpO2 98%   BMI 38.98 kg/m      08/01/2022    2:21 PM 07/04/2022    2:58 PM 03/23/2022   10:17 AM  BP/Weight  Systolic BP 114 126 116  Diastolic BP 72 75 67  Wt. (Lbs) 193  200  BMI 38.98 kg/m2  40.31 kg/m2      Physical Exam Constitutional:      Appearance: She is well-developed.  Cardiovascular:     Rate and Rhythm: Normal rate.     Heart sounds: Normal heart sounds. No murmur heard. Pulmonary:     Effort: Pulmonary effort is normal.     Breath sounds: Normal breath sounds. No wheezing or rales.  Chest:     Chest wall: No tenderness.  Abdominal:     General: Bowel sounds are normal. There is no distension.     Palpations: Abdomen is soft. There is no mass.     Tenderness: There is no abdominal tenderness.  Musculoskeletal:        General: Normal range of motion.     Right lower leg: No edema.     Left lower leg: No edema.  Neurological:     Mental Status: She is alert and oriented to person, place, and time.  Psychiatric:        Mood and Affect: Mood normal.        Latest Ref Rng & Units 03/17/2022    3:35 PM 01/09/2022    1:05 PM 09/15/2021    2:47 PM  CMP  Glucose 70 - 99 mg/dL  98  89   BUN 6 - 20 mg/dL  16  12   Creatinine 9.62 - 1.00 mg/dL 9.52  8.41  3.24   Sodium 135 - 145 mmol/L  137  139   Potassium 3.5 - 5.1 mmol/L  3.8  4.2   Chloride 98 - 111 mmol/L  108  104   CO2 22 - 32 mmol/L  23  21   Calcium 8.9 - 10.3 mg/dL  8.9  9.0   Total Protein 6.5 - 8.1 g/dL  7.0  7.3   Total Bilirubin 0.3 - 1.2 mg/dL  0.4  0.3   Alkaline Phos 38 - 126 U/L  58  79   AST 15 - 41 U/L  30  30   ALT 0 - 44 U/L  33  33     Lipid Panel     Component Value Date/Time   CHOL 189 09/14/2020 1011   TRIG 190 (H) 09/14/2020 1011   HDL 46 09/14/2020 1011   CHOLHDL 4.1 09/14/2020 1011   CHOLHDL 3.3 09/02/2014 1111   VLDL 14 09/02/2014 1111   LDLCALC 110 (H) 09/14/2020 1011    CBC     Component Value Date/Time   WBC 8.6 01/09/2022 1305  RBC 4.60 01/09/2022 1305   HGB 12.7 01/09/2022 1305   HGB 12.7 09/15/2021 1447   HCT 39.6 01/09/2022 1305   HCT 38.5 09/15/2021 1447   PLT 374 01/09/2022 1305   PLT 427 09/15/2021 1447   MCV 86.1 01/09/2022 1305   MCV 82 09/15/2021 1447   MCH 27.6 01/09/2022 1305   MCHC 32.1 01/09/2022 1305   RDW 14.3 01/09/2022 1305   RDW 14.4 09/15/2021 1447   LYMPHSABS 3.0 03/04/2021 2204   LYMPHSABS 2.0 09/14/2020 1011   MONOABS 1.3 (H) 03/04/2021 2204   EOSABS 0.0 03/04/2021 2204   EOSABS 0.1 09/14/2020 1011   BASOSABS 0.1 03/04/2021 2204   BASOSABS 0.1 09/14/2020 1011    Lab Results  Component Value Date   HGBA1C 6.0 08/01/2022    Assessment & Plan:  1. Prediabetes Labs reveal prediabetes with an A1c of 6.0.  Working on a low carbohydrate diet, exercise, weight loss is recommended in order to prevent progression to type 2 diabetes mellitus.  - Basic Metabolic Panel - POCT glycosylated hemoglobin (Hb A1C)  2. Renal cell carcinoma of left kidney [facility]) Status post laparoscopic left radical nephrectomy No evidence of ongoing disease per urology notes Keep upcoming appointment for surveillance with urology and repeat CT  3. Encounter for screening mammogram for malignant neoplasm of breast - MS 3D SCR MAMMO BILAT BR (aka MM); Future  4. Screening for colon cancer - Fecal occult blood, imunochemical   Health Care Maintenance: Up-to-date on Pap smear.  Next Pap smear due next year.  No orders of the defined types were placed in this encounter.   Follow-up: Return in about 1 year (around 08/01/2023) for CPE/ Preventive Health Exam.       Hoy Register, MD, FAAFP. [facility] [place], [place] [phone]   08/01/2022, 2:42 PM

## 2022-08-02 LAB — BASIC METABOLIC PANEL
BUN/Creatinine Ratio: 26 — ABNORMAL HIGH (ref 9–23)
BUN: 16 mg/dL (ref 6–24)
CO2: 18 mmol/L — ABNORMAL LOW (ref 20–29)
Calcium: 9.6 mg/dL (ref 8.7–10.2)
Chloride: 105 mmol/L (ref 96–106)
Creatinine, Ser: 0.61 mg/dL (ref 0.57–1.00)
Glucose: 106 mg/dL — ABNORMAL HIGH (ref 70–99)
Potassium: 4.4 mmol/L (ref 3.5–5.2)
Sodium: 141 mmol/L (ref 134–144)
eGFR: 109 mL/min/{1.73_m2} (ref >59)

## 2022-08-09 ENCOUNTER — Ambulatory Visit: Payer: Self-pay

## 2022-08-10 LAB — FECAL OCCULT BLOOD, IMMUNOCHEMICAL: Fecal Occult Bld: POSITIVE — AB

## 2022-08-11 ENCOUNTER — Other Ambulatory Visit: Payer: Self-pay | Admitting: Family Medicine

## 2022-08-11 DIAGNOSIS — R195 Other fecal abnormalities: Secondary | ICD-10-CM

## 2022-08-16 ENCOUNTER — Ambulatory Visit: Payer: Self-pay | Attending: Family Medicine

## 2022-08-22 ENCOUNTER — Ambulatory Visit
Admission: RE | Admit: 2022-08-22 | Discharge: 2022-08-22 | Disposition: A | Discharge status: Home / Self Care | Payer: Self-pay | Source: Ambulatory Visit | Attending: Urology | Admitting: Urology

## 2022-08-22 DIAGNOSIS — C642 Malignant neoplasm of left kidney, except renal pelvis: Secondary | ICD-10-CM | POA: Insufficient documentation

## 2022-08-22 MED ORDER — IOHEXOL 300 MG/ML  SOLN
100.0000 mL | Freq: Once | INTRAMUSCULAR | Status: AC | PRN
  Administered 2022-08-22: 100 mL via INTRAVENOUS

## 2022-10-05 ENCOUNTER — Ambulatory Visit
Admission: RE | Admit: 2022-10-05 | Discharge: 2022-10-05 | Disposition: A | Discharge status: Home / Self Care | Payer: Self-pay | Attending: Urology | Admitting: Urology

## 2022-10-05 ENCOUNTER — Ambulatory Visit (INDEPENDENT_AMBULATORY_CARE_PROVIDER_SITE_OTHER): Payer: Self-pay | Admitting: Urology

## 2022-10-05 ENCOUNTER — Encounter: Payer: Self-pay | Admitting: Urology

## 2022-10-05 ENCOUNTER — Ambulatory Visit
Admission: RE | Admit: 2022-10-05 | Discharge: 2022-10-05 | Disposition: A | Discharge status: Home / Self Care | Payer: Self-pay | Source: Ambulatory Visit | Attending: Urology | Admitting: Urology

## 2022-10-05 VITALS — BP 107/67 | HR 69 | Wt 189.5 lb

## 2022-10-05 DIAGNOSIS — C642 Malignant neoplasm of left kidney, except renal pelvis: Secondary | ICD-10-CM | POA: Insufficient documentation

## 2022-10-05 DIAGNOSIS — N2889 Other specified disorders of kidney and ureter: Secondary | ICD-10-CM

## 2022-10-05 DIAGNOSIS — R1030 Lower abdominal pain, unspecified: Secondary | ICD-10-CM

## 2022-10-05 DIAGNOSIS — Z905 Acquired absence of kidney: Secondary | ICD-10-CM

## 2022-10-05 DIAGNOSIS — Z85528 Personal history of other malignant neoplasm of kidney: Secondary | ICD-10-CM

## 2022-10-05 NOTE — Progress Notes (Signed)
Marcelle Overlie Plume,acting as a scribe for Vanna Scotland, MD.,have documented all relevant documentation on the behalf of Vanna Scotland, MD,as directed by  Vanna Scotland, MD while in the presence of Vanna Scotland, MD.  10/05/2022 10:17 AM   Katherene Ponto Theodis Blaze 13-Jul-1970 [id number]   HPI: 52 year old female with a personal history of left chromophobe renal cell carcinoma who returns today for a 6 month follow up.   She is s/p hand-assisted laparoscopic left radical nephrectomy for a 10 cm renal mass with me and total hysterectomy, minilaparotomy, myomectomy with Dr.Secord on 02/17/2021. 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    Post op course complicated by nonobstructing PE currently on eliquis x 3 months.   She underwent a CT abdomen/pelvis on 03/18/22 which showed no evidence of recurrent or metastatic carcinoma within the abdomen or pelvis.  She follows up with me today with a CT abdomen pelvis completed on 08/24/2022 that shows no evidence of recurrence. She had her chest x-ray today that has not been interpreted by radiology but was personally reviewed and unremarkable. We will call her if any pathology is identified.   Today, she mentions having continued chronic lower abdominal pain at times. She denies any associated urinary symptoms.   PMH: Past Medical History:  Diagnosis Date   Anemia    Cancer [facility])     Surgical History: Past Surgical History:  Procedure Laterality Date   APPENDECTOMY     CESAREAN SECTION  2000   KIDNEY SURGERY     LAPAROSCOPIC NEPHRECTOMY, HAND ASSISTED Left 02/17/2021   Procedure: HAND ASSISTED LAPAROSCOPIC RADICAL  NEPHRECTOMY;  Surgeon: Vanna Scotland, MD;  Location: [facility] ORS;  Service: Urology;  Laterality: Left;   LAPAROTOMY  02/17/2021   Procedure: LAPAROTOMY OR HAND ASSIST;  Surgeon: Artelia Laroche, MD;  Location: [facility] OR[facility];  Service: Gynecology;;  Fibroid excision     OTHER SURGICAL HISTORY  1993   c-section   PARTIAL HYSTERECTOMY     ROBOTIC ASSISTED LAPAROSCOPIC HYSTERECTOMY AND SALPINGECTOMY Bilateral 02/17/2021   Procedure: XI ROBOTIC ASSISTED LAPAROSCOPIC HYSTERECTOMY AND SALPINGECTOMY;  Surgeon: Artelia Laroche, MD;  Location: [facility] ORS;  Service: Gynecology;  Laterality: Bilateral;    Home Medications:  Allergies as of 10/05/2022   No Known Allergies      Medication List        Accurate as of October 05, 2022 10:17 AM. If you have any questions, ask your nurse or doctor.          cetirizine 10 MG tablet Commonly known as: ZYRTEC Tome 1 tableta (10 mg en total) por va oral diariamente. (Take 1 tablet (10 mg total) by mouth daily.)   fluticasone 50 MCG/ACT nasal spray Commonly known as: FLONASE Place 1 spray into both nostrils daily.        Family History: Family History  Problem Relation Age of Onset   Migraines Mother    Hypertension Mother    Migraines Father    Hypertension Father    Diabetes Mother    Diabetes Paternal Grandmother     Social History:  reports that she has never smoked. She has never been exposed to tobacco smoke. She has never used smokeless tobacco. She reports that she does not currently use alcohol. She reports that she does not currently use drugs.   Physical Exam: BP 107/67   Pulse 69   Wt 189 lb 8 oz (86 kg)   LMP 01/18/2019  BMI 38.27 kg/m   Constitutional:  Alert and oriented, No acute distress. HEENT: [place] AT, moist mucus membranes.  Trachea midline, no masses. Neurologic: Grossly intact, no focal deficits, moving all 4 extremities. Psychiatric: Normal mood and affect.  Pertinent Imaging:  EXAM: CT ABDOMEN AND PELVIS WITH CONTRAST   TECHNIQUE: Multidetector CT imaging of the abdomen and pelvis was performed using the standard protocol following bolus administration of intravenous contrast.   RADIATION DOSE REDUCTION: This exam was performed according to the departmental  dose-optimization program which includes automated exposure control, adjustment of the mA and/or kV according to patient size and/or use of iterative reconstruction technique.   CONTRAST:  OMNIPAQUE IOHEXOL 300 MG/ML  SOLN   COMPARISON:  03/17/2022   FINDINGS: Lower chest: Clear lung bases. Normal heart size without pericardial or pleural effusion.   Hepatobiliary: Tiny segment 2 hepatic low-density lesion is likely a cyst. Gallstones without acute cholecystitis or biliary duct dilatation.   Pancreas: Normal, without mass or ductal dilatation.   Spleen: Normal in size, without focal abnormality.   Adrenals/Urinary Tract: Normal adrenal glands. Normal right kidney, without hydronephrosis.   Left nephrectomy, without local recurrence. Soft tissue densities within the operative bed, including on 45/2, represent venous collaterals when correlated with multiple priors.   Normal urinary bladder.   Stomach/Bowel: Normal stomach, without wall thickening. Normal colon and terminal ileum. Normal small bowel.   Vascular/Lymphatic: Normal caliber of the aorta and branch vessels. No abdominopelvic adenopathy.   Reproductive: Hysterectomy.  No adnexal mass.   Other: No significant free fluid.   Musculoskeletal: No acute osseous abnormality.   IMPRESSION: 1. Left nephrectomy without recurrent or metastatic disease. 2. Cholelithiasis     Electronically Signed   By: Jeronimo Greaves M.D.   On: 08/24/2022 13:46  This was personally reviewed and I agree with the radiologic interpretation.    Assessment & Plan:     Left chromophobe Renal cell carcinoma  - S/p hand-assisted laparoscopic left radical nephrectomy - No evidence of disease, stage III - We will continue to need continue 4-month interval CT abdomen for total of 3 years and then annually as well as chest imaging - CXR today  2. Lower abdominal pain - CT scan is reassuring. - There is no indication this is related  to her bladder in the absence of urinary issues. - Recommend supportive care  3. Solitary kidney - Precautions were reviewed today.  - Again, she had some questions about the use of NSAIDS. Discouraged her from routine use of these in the setting of solitary kidney.  Return in about 6 months (around 04/07/2023) for Ct/ CXR.  I have reviewed the above documentation for accuracy and completeness, and I agree with the above.   Vanna Scotland, MD    [facility] [address] [place], [place] [address] [phone]

## 2022-11-18 ENCOUNTER — Ambulatory Visit
Admission: EM | Admit: 2022-11-18 | Discharge: 2022-11-18 | Disposition: A | Discharge status: Home / Self Care | Payer: Self-pay | Attending: Physician Assistant | Admitting: Physician Assistant

## 2022-11-18 ENCOUNTER — Other Ambulatory Visit: Payer: Self-pay

## 2022-11-18 DIAGNOSIS — B349 Viral infection, unspecified: Secondary | ICD-10-CM | POA: Insufficient documentation

## 2022-11-18 DIAGNOSIS — Z1152 Encounter for screening for COVID-19: Secondary | ICD-10-CM | POA: Insufficient documentation

## 2022-11-18 DIAGNOSIS — D849 Immunodeficiency, unspecified: Secondary | ICD-10-CM | POA: Insufficient documentation

## 2022-11-18 LAB — POCT RAPID STREP A (OFFICE): Rapid Strep A Screen: NEGATIVE

## 2022-11-18 MED ORDER — KETOROLAC TROMETHAMINE 30 MG/ML IJ SOLN: 30.0000 mg | Freq: Once | INTRAMUSCULAR | Status: DC

## 2022-11-18 MED ORDER — BENZONATATE 100 MG PO CAPS
100.0000 mg | ORAL_CAPSULE | Freq: Three times a day (TID) | ORAL | 0 refills | Status: DC
  Filled 2022-11-18: qty 21, 7d supply, fill #0

## 2022-11-18 MED ORDER — ACETAMINOPHEN 325 MG PO TABS
975.0000 mg | ORAL_TABLET | Freq: Once | ORAL | Status: AC
  Administered 2022-11-18: 975 mg via ORAL

## 2022-11-18 MED ORDER — GUAIFENESIN ER 600 MG PO TB12
600.0000 mg | ORAL_TABLET | Freq: Two times a day (BID) | ORAL | 0 refills | Status: DC
  Filled 2022-11-18: qty 30, 15d supply, fill #0

## 2022-11-18 NOTE — ED Triage Notes (Signed)
Due to language barrier, an interpreter was present during the history-taking and subsequent discussion (and for part of the physical exam) with this patient. By Phone.  "On Wednesday I began with something in my throat, chills". Some runny nose. No cough "just clearing of throat or when talking". No new/unexplained rash. "Feel's like I have had a Fever".

## 2022-11-18 NOTE — ED Notes (Signed)
D/C instructions reviewed with patient using video interpreter

## 2022-11-18 NOTE — Discharge Instructions (Signed)
Your symptoms are most likely due to a viral illness, which will improve on its own with rest and fluids. COVID testing is pending, staff will call you if this is positive. Wear a mask for 5 days of symptoms while you are in public, then you may remove your mask. You may go back to work if you do not have a fever for 24 hours without any medicines.  - Take prescribed medicines to help with symptoms: tessalon perles - Use over the counter medicines to help with symptoms as discussed: Ibuprofen, tylenol, guaifenesin (mucinex), zyrtec, etc I gave you a shot of torodol today, no ibuprofen for 24 hours. - Two teaspoons of honey in warm water every 4-6 hours may help with throat pains - Humidifier in your room at night to help add water the air and soothe cough  If you develop any new or worsening symptoms or do not improve in the next 2 to 3 days, please return.  If your symptoms are severe, please go to the emergency room.  Follow-up with PCP as needed.

## 2022-11-18 NOTE — ED Provider Notes (Signed)
[facility]    CSN: [id number] Arrival date & time: 11/18/22  1229      History   Chief Complaint Chief Complaint  Patient presents with   Sore Throat    HPI Mariah Lang is a 52 y.o. female.   Patient presents to urgent care for evaluation of sore throat, nasal congestion, and chills that started Wednesday November 16, 2022 2 days ago.  Sore throat is worse with swallowing.  Mucus is purulent/yellow.  Reports headache to the frontal aspect of the head but does not radiate.  No associated vision changes, dizziness, photophobia, phonophobia, or neck pain.  Reports generalized bodyaches.  Unsure of max temp at home due to lack of thermometer.  Reports significant chills.  No cough, nausea, vomiting, rash, abdominal pain, diarrhea, recent antibiotic/steroid use, or recent known sick contacts with similar symptoms.  No history of chronic respiratory problems.  Never smoker, denies drug use.  No chest pain, shortness of breath, leg swelling, orthopnea or heart palpitations.  She took 1 dose of Tylenol when symptoms first started and has not had any antipyretics cough/cold medications since.  History of renal cell carcinoma.  The history is provided by the patient. The history is limited by a language barrier. A language interpreter was used (Spanish medical interpreter used for entirety of visit).  Sore Throat    Past Medical History:  Diagnosis Date   Anemia    Cancer [facility])     Patient Active Problem List   Diagnosis Date Noted   Hyponatremia 02/20/2021   Aspiration pneumonia of both lower lobes due to gastric secretions (HCC) 02/19/2021   Pulmonary embolism (HCC) 02/19/2021   Acute hypoxemic respiratory failure (HCC) 02/19/2021   Renal cell carcinoma (HCC) 02/17/2021   Ankle edema, bilateral 09/14/2020   Vitamin D deficiency 09/14/2020   Thrombocytosis 05/11/2019   Language barrier 12/31/2018   Screening breast examination 12/04/2018   Fibroid  uterus 12/18/2014   Menorrhagia, premenopausal 11/24/2014   Iron deficiency anemia 11/24/2014   Iron deficiency anemia due to chronic blood loss 09/02/2014   Pap smear for cervical cancer screening 09/02/2014   Annual physical exam 09/02/2014   Dysmenorrhea 02/15/2013   Anemia due to Dysmenorrhea 02/15/2013    Past Surgical History:  Procedure Laterality Date   APPENDECTOMY     CESAREAN SECTION  2000   KIDNEY SURGERY     LAPAROSCOPIC NEPHRECTOMY, HAND ASSISTED Left 02/17/2021   Procedure: HAND ASSISTED LAPAROSCOPIC RADICAL  NEPHRECTOMY;  Surgeon: Vanna Scotland, MD;  Location: [facility];  Service: Urology;  Laterality: Left;   LAPAROTOMY  02/17/2021   Procedure: LAPAROTOMY OR HAND ASSIST;  Surgeon: Artelia Laroche, MD;  Location: [facility];  Service: Gynecology;;  Fibroid excision    OTHER SURGICAL HISTORY  1993   c-section   PARTIAL HYSTERECTOMY     ROBOTIC ASSISTED LAPAROSCOPIC HYSTERECTOMY AND SALPINGECTOMY Bilateral 02/17/2021   Procedure: XI ROBOTIC ASSISTED LAPAROSCOPIC HYSTERECTOMY AND SALPINGECTOMY;  Surgeon: Artelia Laroche, MD;  Location: [facility];  Service: Gynecology;  Laterality: Bilateral;    OB History     Gravida  2   Para  2   Term  2   Preterm  0   AB  0   Living         SAB  0   IAB  0   Ectopic  0   Multiple      Live Births  Home Medications    Prior to Admission medications   Medication Sig Start Date End Date Taking? Authorizing Provider  benzonatate (TESSALON) 100 MG capsule Take 1 capsule (100 mg total) by mouth every 8 (eight) hours. 11/18/22  Yes Carlisle Beers, FNP  guaiFENesin (MUCINEX) 600 MG 12 hr tablet Take 1 tablet (600 mg total) by mouth 2 (two) times daily. 11/18/22  Yes Carlisle Beers, FNP  cetirizine (ZYRTEC) 10 MG tablet Take 1 tablet (10 mg total) by mouth daily. 07/04/22   Gustavus Bryant, FNP  fluticasone (FLONASE) 50 MCG/ACT nasal spray Place 1 spray into both nostrils  daily. 07/04/22   Gustavus Bryant, FNP    Family History Family History  Problem Relation Age of Onset   Migraines Mother    Hypertension Mother    Migraines Father    Hypertension Father    Diabetes Mother    Diabetes Paternal Grandmother     Social History Social History   Tobacco Use   Smoking status: Never    Passive exposure: Never   Smokeless tobacco: Never  Vaping Use   Vaping status: Never Used  Substance Use Topics   Alcohol use: Not Currently   Drug use: Not Currently     Allergies   Patient has no known allergies.   Review of Systems Review of Systems Per HPI  Physical Exam Triage Vital Signs ED Triage Vitals  Encounter Vitals Group     BP 11/18/22 1313 122/82     Systolic BP Percentile --      Diastolic BP Percentile --      Pulse Rate 11/18/22 1313 75     Resp 11/18/22 1313 18     Temp 11/18/22 1313 98.8 F (37.1 C)     Temp Source 11/18/22 1313 Oral     SpO2 11/18/22 1313 96 %     Weight 11/18/22 1311 200 lb (90.7 kg)     Height 11/18/22 1311 4\' 11"  (1.499 m)     Head Circumference --      Peak Flow --      Pain Score 11/18/22 1311 10     Pain Loc --      Pain Education --      Exclude from Growth Chart --    No data found.  Updated Vital Signs BP 122/82 (BP Location: Left Arm)   Pulse 75   Temp 98.8 F (37.1 C) (Oral)   Resp 18   Ht 4\' 11"  (1.499 m)   Wt 200 lb (90.7 kg)   LMP 01/18/2019   SpO2 96%   BMI 40.40 kg/m   Visual Acuity Right Eye Distance:   Left Eye Distance:   Bilateral Distance:    Right Eye Near:   Left Eye Near:    Bilateral Near:     Physical Exam Vitals and nursing note reviewed.  Constitutional:      Appearance: She is obese. She is ill-appearing. She is not toxic-appearing.  HENT:     Head: Normocephalic and atraumatic.     Right Ear: Hearing, tympanic membrane, ear canal and external ear normal.     Left Ear: Hearing, tympanic membrane, ear canal and external ear normal.     Nose: Congestion  present.     Mouth/Throat:     Lips: Pink.     Mouth: Mucous membranes are moist. No injury.     Tongue: No lesions. Tongue does not deviate from midline.     Palate: No  mass and lesions.     Pharynx: Oropharynx is clear. Uvula midline. Posterior oropharyngeal erythema present. No pharyngeal swelling, oropharyngeal exudate or uvula swelling.     Tonsils: No tonsillar exudate or tonsillar abscesses. 1+ on the right. 1+ on the left.  Eyes:     General: Lids are normal. Vision grossly intact. Gaze aligned appropriately.     Extraocular Movements: Extraocular movements intact.     Conjunctiva/sclera: Conjunctivae normal.     Pupils: Pupils are equal, round, and reactive to light.  Cardiovascular:     Rate and Rhythm: Normal rate and regular rhythm.     Heart sounds: Normal heart sounds, S1 normal and S2 normal.  Pulmonary:     Effort: Pulmonary effort is normal. No respiratory distress.     Breath sounds: Normal breath sounds and air entry. No wheezing, rhonchi or rales.  Chest:     Chest wall: No tenderness.  Musculoskeletal:     Cervical back: Neck supple.  Skin:    General: Skin is warm and dry.     Capillary Refill: Capillary refill takes less than 2 seconds.     Findings: No rash.  Neurological:     General: No focal deficit present.     Mental Status: She is alert and oriented to person, place, and time. Mental status is at baseline.     Cranial Nerves: No dysarthria or facial asymmetry.  Psychiatric:        Mood and Affect: Mood normal.        Speech: Speech normal.        Behavior: Behavior normal.        Thought Content: Thought content normal.        Judgment: Judgment normal.      UC Treatments / Results  Labs (all labs ordered are listed, but only abnormal results are displayed) Labs Reviewed  SARS CORONAVIRUS 2 (TAT 6-24 HRS)  POCT RAPID STREP A (OFFICE)    EKG   Radiology No results found.  Procedures Procedures (including critical care  time)  Medications Ordered in UC Medications  acetaminophen (TYLENOL) tablet 975 mg (has no administration in time range)    Initial Impression / Assessment and Plan / UC Course  I have reviewed the triage vital signs and the nursing notes.  Pertinent labs & imaging results that were available during my care of the patient were reviewed by me and considered in my medical decision making (see chart for details).   1. Viral illness Evaluation suggests viral URI etiology. Will manage this with recommendations for OTC and prescription medications for symptomatic relief. Encouraged to push fluids to stay well hydrated.  Imaging: deferred based on stable cardiopulmonary exam/hemodynamically stable vital signs Prescriptions sent for further symptomatic relief, may continue using OTC medications as needed. Medications given in clinic: tylenol Strep/Viral testing: COVID-19 testing is pending, patient is a candidate for antiviral therapy, may have molnupiravir if positive.  Counseled patient on potential for adverse effects with medications prescribed/recommended today, strict ER and return-to-clinic precautions discussed, patient verbalized understanding.    Final Clinical Impressions(s) / UC Diagnoses   Final diagnoses:  Viral illness  Immunosuppression [facility])     Discharge Instructions      Your symptoms are most likely due to a viral illness, which will improve on its own with rest and fluids. COVID testing is pending, staff will call you if this is positive. Wear a mask for 5 days of symptoms while you are in public, then  you may remove your mask. You may go back to work if you do not have a fever for 24 hours without any medicines.  - Take prescribed medicines to help with symptoms: tessalon perles - Use over the counter medicines to help with symptoms as discussed: Ibuprofen, tylenol, guaifenesin (mucinex), zyrtec, etc I gave you a shot of torodol today, no ibuprofen for 24  hours. - Two teaspoons of honey in warm water every 4-6 hours may help with throat pains - Humidifier in your room at night to help add water the air and soothe cough  If you develop any new or worsening symptoms or do not improve in the next 2 to 3 days, please return.  If your symptoms are severe, please go to the emergency room.  Follow-up with PCP as needed.     ED Prescriptions     Medication Sig Dispense Auth. Provider   benzonatate (TESSALON) 100 MG capsule Take 1 capsule (100 mg total) by mouth every 8 (eight) hours. 21 capsule Reita May M, FNP   guaiFENesin (MUCINEX) 600 MG 12 hr tablet Take 1 tablet (600 mg total) by mouth 2 (two) times daily. 30 tablet Carlisle Beers, FNP      PDMP not reviewed this encounter.   Carlisle Beers, [place] 11/18/22 1506

## 2022-11-19 LAB — SARS CORONAVIRUS 2 (TAT 6-24 HRS): SARS Coronavirus 2: NEGATIVE

## 2023-04-12 ENCOUNTER — Ambulatory Visit: Payer: Self-pay | Admitting: Urology

## 2023-04-18 ENCOUNTER — Ambulatory Visit (HOSPITAL_COMMUNITY)
Admission: RE | Admit: 2023-04-18 | Discharge: 2023-04-18 | Disposition: A | Discharge status: Home / Self Care | Payer: Self-pay | Source: Ambulatory Visit | Attending: Urology

## 2023-04-18 ENCOUNTER — Ambulatory Visit (HOSPITAL_COMMUNITY)
Admission: RE | Admit: 2023-04-18 | Discharge: 2023-04-18 | Disposition: A | Discharge status: Home / Self Care | Payer: Self-pay | Source: Ambulatory Visit | Attending: Urology | Admitting: Urology

## 2023-04-18 DIAGNOSIS — N2889 Other specified disorders of kidney and ureter: Secondary | ICD-10-CM

## 2023-04-18 MED ORDER — IOHEXOL 300 MG/ML  SOLN
100.0000 mL | Freq: Once | INTRAMUSCULAR | Status: AC | PRN
  Administered 2023-04-18: 100 mL via INTRAVENOUS

## 2023-05-10 ENCOUNTER — Ambulatory Visit (INDEPENDENT_AMBULATORY_CARE_PROVIDER_SITE_OTHER): Payer: Self-pay | Admitting: Urology

## 2023-05-10 ENCOUNTER — Encounter: Payer: Self-pay | Admitting: Urology

## 2023-05-10 VITALS — BP 110/73 | HR 88 | Ht 59.0 in | Wt 180.0 lb

## 2023-05-10 DIAGNOSIS — Z85528 Personal history of other malignant neoplasm of kidney: Secondary | ICD-10-CM

## 2023-05-10 DIAGNOSIS — K5900 Constipation, unspecified: Secondary | ICD-10-CM

## 2023-05-10 DIAGNOSIS — C642 Malignant neoplasm of left kidney, except renal pelvis: Secondary | ICD-10-CM

## 2023-05-10 DIAGNOSIS — R142 Eructation: Secondary | ICD-10-CM

## 2023-05-10 NOTE — Progress Notes (Signed)
 Mariah Lang,acting as a scribe for Mariah Riis, MD.,have documented all relevant documentation on the behalf of Mariah Riis, MD,as directed by  Mariah Riis, MD while in the presence of Mariah Riis, MD.  05/10/23 2:05 PM   Mariah Lang 01/06/1971 [id number]  Referring provider: Delbert Clam, MD [address] [address] [place],  [place] [address]  Chief Complaint  Patient presents with   Results    HPI: 53 year old female with a personal history of left chromophobe renal cell carcinoma who returns today for a 6 month follow up.    She is s/p hand-assisted laparoscopic left radical nephrectomy for a 10 cm renal mass with me and total hysterectomy, minilaparotomy, myomectomy with Dr.Secord on 02/17/2021. 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    Post op course complicated by nonobstructing PE currently on eliquis  x 3 months.  She follows up with me today with a CT abdomen pelvis completed on 04/18/2023 that shows no evidence of recurrence. She had her chest x-ray on the same day that was personally reviewed and unremarkable.   She reports occasional swelling in the lower abdomen, which has been persistent but unexplained by imaging.  She experiences constipation, which may contribute to the sensation of abdominal pressure. She occasionally uses a powder similar to Metamucil for relief, which helps sometimes.  PMH: Past Medical History:  Diagnosis Date   Anemia    Cancer [facility])     Surgical History: Past Surgical History:  Procedure Laterality Date   APPENDECTOMY     CESAREAN SECTION  2000   KIDNEY SURGERY     LAPAROSCOPIC NEPHRECTOMY, HAND ASSISTED Left 02/17/2021   Procedure: HAND ASSISTED LAPAROSCOPIC RADICAL  NEPHRECTOMY;  Surgeon: Lang Rosina, MD;  Location: [facility] ORS;  Service: Urology;  Laterality: Left;   LAPAROTOMY  02/17/2021   Procedure: LAPAROTOMY OR HAND ASSIST;  Surgeon:  Elby Webb Loges, MD;  Location: [facility] ORS;  Service: Gynecology;;  Fibroid excision    OTHER SURGICAL HISTORY  1993   c-section   PARTIAL HYSTERECTOMY     ROBOTIC ASSISTED LAPAROSCOPIC HYSTERECTOMY AND SALPINGECTOMY Bilateral 02/17/2021   Procedure: XI ROBOTIC ASSISTED LAPAROSCOPIC HYSTERECTOMY AND SALPINGECTOMY;  Surgeon: Elby Webb Loges, MD;  Location: [facility] ORS;  Service: Gynecology;  Laterality: Bilateral;    Home Medications:  Allergies as of 05/10/2023   No Known Allergies      Medication List        Accurate as of May 10, 2023  2:05 PM. If you have any questions, ask your nurse or doctor.          STOP taking these medications    benzonatate  100 MG capsule Commonly known as: TESSALON  Stopped by: Mariah Lang   cetirizine  10 MG tablet Commonly known as: ZYRTEC  Stopped by: Mariah Lang   fluticasone  50 MCG/ACT nasal spray Commonly known as: FLONASE  Stopped by: Mariah Lang   guaiFENesin  600 MG 12 hr tablet Commonly known as: Mucinex  Stopped by: Mariah Lang         Family History: Family History  Problem Relation Age of Onset   Migraines Mother    Hypertension Mother    Migraines Father    Hypertension Father    Diabetes Mother    Diabetes Paternal Grandmother     Social History:  reports that she has never smoked. She has never been exposed to tobacco smoke. She has never used smokeless tobacco. She reports that she does not currently  use alcohol. She reports that she does not currently use drugs.   Physical Exam: BP 110/73   Pulse 88   Ht 4' 11 (1.499 m)   Wt 180 lb (81.6 kg)   LMP 01/18/2019   BMI 36.36 kg/m   Constitutional:  Alert and oriented, No acute distress. HEENT: [place] AT, moist mucus membranes.  Trachea midline, no masses. Neurologic: Grossly intact, no focal deficits, moving all 4 extremities. Psychiatric: Normal mood and affect.  Pertinent Imaging: EXAM: CT ABDOMEN AND PELVIS WITH CONTRAST    TECHNIQUE: Multidetector CT imaging of the abdomen and pelvis was performed using the standard protocol following bolus administration of intravenous contrast.   RADIATION DOSE REDUCTION: This exam was performed according to the departmental dose-optimization program which includes automated exposure control, adjustment of the mA and/or kV according to patient size and/or use of iterative reconstruction technique.   CONTRAST:  OMNIPAQUE  IOHEXOL  300 MG/ML  SOLN   COMPARISON:  08/22/2022   FINDINGS: Lower chest: Clear lung bases. Normal heart size without pericardial or pleural effusion.   Hepatobiliary: Tiny segment 2 hepatic cyst again identified.   Noncalcified gallstones up to 17 mm without acute cholecystitis or biliary duct dilatation.   Pancreas: Normal, without mass or ductal dilatation.   Spleen: Normal in size, without focal abnormality.   Adrenals/Urinary Tract: Normal adrenal glands. Normal right kidney, without mass or hydronephrosis. Left nephrectomy, without local recurrence. Minimal soft tissue density within the inferior portion of the nephrectomy bed, including up to 6 mm on 49/2, is unchanged and secondary to venous collaterals when correlated with prior exams.   Normal urinary bladder.   Stomach/Bowel: Normal stomach, without wall thickening. Normal colon and terminal ileum. Normal small bowel.   Vascular/Lymphatic: Normal caliber of the aorta and branch vessels. No abdominopelvic adenopathy.   Reproductive: Hysterectomy.  No adnexal mass.   Other: No significant free fluid.  No free intraperitoneal air.   Musculoskeletal: No acute osseous abnormality.   IMPRESSION: 1. Status post left nephrectomy, without recurrent or metastatic disease. 2. Cholelithiasis     Electronically Signed   By: Rockey Kilts M.D.   On: 04/24/2023 17:48  This was personally reviewed and I agree with the radiologic interpretation.   EXAM: CHEST  1 VIEW    COMPARISON:  10/05/2022.   FINDINGS: Cardiac silhouette is normal in size configuration. No mediastinal or hilar masses. No evidence of adenopathy.   Lungs are clear.  No pleural effusion or pneumothorax.   Skeletal structures are grossly intact.   IMPRESSION: No active disease.     Electronically Signed   By: Alm Parkins M.D.   On: 04/19/2023 14:04  This was personally reviewed and I agree with the radiologic interpretation.  Assessment & Plan:    1. Left chromophobe renal cell carcinoma - We will transition to annual imaging at this point, given lack of recurrence  - I personally reviewed her CT abdomen and chest XR, both of which showed no evidence of metastatic recurrent, locally recurrent disease  2. Abdominal bloating/ Constipation - Likely related to constipation as per patient report and absence of findings on CT. - Recommend maintenance with Metamucil or similar fiber supplement. Consider Colace for regular use. - If constipation persists for more than a day or two, use Miralax as needed.   Return in about 1 year (around 05/09/2024) for CT abdomen and chest XR.  I have reviewed the above documentation for accuracy and completeness, and I agree with the above.  Mariah Riis, MD  [facility] [address] [place], [place] [address] [phone]

## 2023-07-04 ENCOUNTER — Ambulatory Visit
Admission: EM | Admit: 2023-07-04 | Discharge: 2023-07-04 | Disposition: A | Discharge status: Home / Self Care | Payer: Self-pay | Attending: Internal Medicine | Admitting: Internal Medicine

## 2023-07-04 ENCOUNTER — Other Ambulatory Visit: Payer: Self-pay

## 2023-07-04 DIAGNOSIS — R519 Headache, unspecified: Secondary | ICD-10-CM

## 2023-07-04 DIAGNOSIS — R0981 Nasal congestion: Secondary | ICD-10-CM

## 2023-07-04 DIAGNOSIS — M545 Low back pain, unspecified: Secondary | ICD-10-CM

## 2023-07-04 MED ORDER — FLUTICASONE PROPIONATE 50 MCG/ACT NA SUSP
1.0000 | Freq: Every day | NASAL | 0 refills | Status: AC
  Filled 2023-07-04: qty 16, 60d supply, fill #0

## 2023-07-04 MED ORDER — CETIRIZINE HCL 10 MG PO TABS
10.0000 mg | ORAL_TABLET | Freq: Every day | ORAL | 0 refills | Status: AC
  Filled 2023-07-04: qty 30, 30d supply, fill #0

## 2023-07-04 NOTE — Discharge Instructions (Addendum)
 I have sent you 2 medications for symptoms.  Please follow-up with PCP if symptoms persist or worsen and for your back pain.

## 2023-07-04 NOTE — ED Provider Notes (Signed)
 [facility]    CSN: [id number] Arrival date & time: 07/04/23  1035      History   Chief Complaint Chief Complaint  Patient presents with   Headache   Nasal Congestion    HPI Mariah Lang is a 53 y.o. female.   Patient presents with several different chief complaints today.  Reports 4-day history of headache, nasal congestion, and eye discomfort.  Denies any coughing, fever, known sick contacts but does report some chills.  Has taken an allergy medicine that she is not sure the name of the with no improvement in symptoms.  Patient also complaining of left lower back pain that has been present intermittently for the past 3 years.  Reports that it started after she had her left kidney removed.  States that she discussed this with the surgeon and they told her that there was nothing wrong.  Denies any recent injuries to the area.   Headache   Past Medical History:  Diagnosis Date   Anemia    Cancer [facility])     Patient Active Problem List   Diagnosis Date Noted   Hyponatremia 02/20/2021   Aspiration pneumonia of both lower lobes due to gastric secretions (HCC) 02/19/2021   Pulmonary embolism (HCC) 02/19/2021   Acute hypoxemic respiratory failure (HCC) 02/19/2021   Renal cell carcinoma (HCC) 02/17/2021   Ankle edema, bilateral 09/14/2020   Vitamin D deficiency 09/14/2020   Thrombocytosis 05/11/2019   Language barrier 12/31/2018   Screening breast examination 12/04/2018   Fibroid uterus 12/18/2014   Menorrhagia, premenopausal 11/24/2014   Iron deficiency anemia 11/24/2014   Iron deficiency anemia due to chronic blood loss 09/02/2014   Pap smear for cervical cancer screening 09/02/2014   Annual physical exam 09/02/2014   Dysmenorrhea 02/15/2013   Anemia due to Dysmenorrhea 02/15/2013    Past Surgical History:  Procedure Laterality Date   APPENDECTOMY     CESAREAN SECTION  2000   KIDNEY SURGERY     LAPAROSCOPIC NEPHRECTOMY, HAND ASSISTED  Left 02/17/2021   Procedure: HAND ASSISTED LAPAROSCOPIC RADICAL  NEPHRECTOMY;  Surgeon: Vanna Scotland, MD;  Location: [facility];  Service: Urology;  Laterality: Left;   LAPAROTOMY  02/17/2021   Procedure: LAPAROTOMY OR HAND ASSIST;  Surgeon: Artelia Laroche, MD;  Location: [facility];  Service: Gynecology;;  Fibroid excision    OTHER SURGICAL HISTORY  1993   c-section   PARTIAL HYSTERECTOMY     ROBOTIC ASSISTED LAPAROSCOPIC HYSTERECTOMY AND SALPINGECTOMY Bilateral 02/17/2021   Procedure: XI ROBOTIC ASSISTED LAPAROSCOPIC HYSTERECTOMY AND SALPINGECTOMY;  Surgeon: Artelia Laroche, MD;  Location: [facility];  Service: Gynecology;  Laterality: Bilateral;    OB History     Gravida  2   Para  2   Term  2   Preterm  0   AB  0   Living         SAB  0   IAB  0   Ectopic  0   Multiple      Live Births               Home Medications    Prior to Admission medications   Medication Sig Start Date End Date Taking? Authorizing Provider  cetirizine (ZYRTEC) 10 MG tablet Take 1 tablet (10 mg total) by mouth daily. 07/04/23  Yes Anwen Cannedy, Rolly Salter E, FNP  fluticasone (FLONASE) 50 MCG/ACT nasal spray Place 1 spray into both nostrils daily. 07/04/23  Yes Gustavus Bryant, FNP  Family History Family History  Problem Relation Age of Onset   Migraines Mother    Hypertension Mother    Migraines Father    Hypertension Father    Diabetes Mother    Diabetes Paternal Grandmother     Social History Social History   Tobacco Use   Smoking status: Never    Passive exposure: Never   Smokeless tobacco: Never  Vaping Use   Vaping status: Never Used  Substance Use Topics   Alcohol use: Not Currently   Drug use: Not Currently     Allergies   Patient has no known allergies.   Review of Systems Review of Systems Per HPI  Physical Exam Triage Vital Signs ED Triage Vitals  Encounter Vitals Group     BP 07/04/23 1221 121/80     Systolic BP Percentile --       Diastolic BP Percentile --      Pulse Rate 07/04/23 1221 62     Resp 07/04/23 1221 18     Temp 07/04/23 1221 97.9 F (36.6 C)     Temp Source 07/04/23 1221 Oral     SpO2 07/04/23 1221 96 %     Weight --      Height --      Head Circumference --      Peak Flow --      Pain Score 07/04/23 1219 9     Pain Loc --      Pain Education --      Exclude from Growth Chart --    No data found.  Updated Vital Signs BP 121/80 (BP Location: Left Arm)   Pulse 62   Temp 97.9 F (36.6 C) (Oral)   Resp 18   LMP 01/18/2019   SpO2 96%   Visual Acuity Right Eye Distance:   Left Eye Distance:   Bilateral Distance:    Right Eye Near:   Left Eye Near:    Bilateral Near:     Physical Exam Constitutional:      General: She is not in acute distress.    Appearance: Normal appearance. She is not toxic-appearing or diaphoretic.  HENT:     Head: Normocephalic and atraumatic.     Right Ear: Tympanic membrane and ear canal normal.     Left Ear: Tympanic membrane and ear canal normal.     Nose: Congestion present.     Mouth/Throat:     Mouth: Mucous membranes are moist.     Pharynx: No posterior oropharyngeal erythema.  Eyes:     Extraocular Movements: Extraocular movements intact.     Conjunctiva/sclera: Conjunctivae normal.     Pupils: Pupils are equal, round, and reactive to light.  Cardiovascular:     Rate and Rhythm: Normal rate and regular rhythm.     Pulses: Normal pulses.     Heart sounds: Normal heart sounds.  Pulmonary:     Effort: Pulmonary effort is normal. No respiratory distress.     Breath sounds: Normal breath sounds. No stridor. No wheezing, rhonchi or rales.  Musculoskeletal:        General: Normal range of motion.     Cervical back: Normal range of motion.     Comments: There is no tenderness to palpation to left lower back.  No direct spinal tenderness, crepitus, step-off noted.  No discoloration or swelling noted. Patient reports pain with movement.   Skin:     General: Skin is warm and dry.  Neurological:  General: No focal deficit present.     Mental Status: She is alert and oriented to person, place, and time. Mental status is at baseline.  Psychiatric:        Mood and Affect: Mood normal.        Behavior: Behavior normal.      UC Treatments / Results  Labs (all labs ordered are listed, but only abnormal results are displayed) Labs Reviewed - No data to display  EKG   Radiology No results found.  Procedures Procedures (including critical care time)  Medications Ordered in UC Medications - No data to display  Initial Impression / Assessment and Plan / UC Course  I have reviewed the triage vital signs and the nursing notes.  Pertinent labs & imaging results that were available during my care of the patient were reviewed by me and considered in my medical decision making (see chart for details).     1.  Headache and nasal congestion  Differential diagnoses include allergic rhinitis versus viral illness.  Suspect patient's eye discomfort is due to sinus pressure.  Will treat with cetirizine antihistamine daily and Flonase as she denies that she takes any these medications daily.  I do think that more consistent antihistamine will be helpful as opposed to only taking it every so often as patient stated that she had done.  Advised strict follow-up if any symptoms persist or worsen.  2.  Left lower back pain  Given chronicity of issue, recommended PCP follow-up.  Imaging was deferred given no injury and chronicity of issue.  Patient verbalized understanding and was agreeable with plan.  Interpreter used throughout patient interaction. Advised tylenol as needed for pain.  Final Clinical Impressions(s) / UC Diagnoses   Final diagnoses:  Nasal congestion  Acute nonintractable headache, unspecified headache type  Acute left-sided low back pain without sciatica     Discharge Instructions      I have sent you 2 medications for  symptoms.  Please follow-up with PCP if symptoms persist or worsen and for your back pain.    ED Prescriptions     Medication Sig Dispense Auth. Provider   cetirizine (ZYRTEC) 10 MG tablet Take 1 tablet (10 mg total) by mouth daily. 30 tablet [place], [place], [place]   fluticasone [facility]) 50 MCG/ACT nasal spray Place 1 spray into both nostrils daily. 16 g Gustavus Bryant, [place]      PDMP not reviewed this encounter.   Gustavus Bryant, [place] 07/04/23 [phone]

## 2023-07-04 NOTE — ED Triage Notes (Signed)
 Mariah Lang - ID # [id number]  Pt presents with headaches and nasal congestion x 4 dys.

## 2023-07-06 ENCOUNTER — Other Ambulatory Visit: Payer: Self-pay

## 2023-08-01 ENCOUNTER — Ambulatory Visit: Payer: Self-pay | Admitting: Family Medicine

## 2023-11-20 ENCOUNTER — Encounter: Payer: Self-pay | Admitting: Urology

## 2024-02-16 ENCOUNTER — Other Ambulatory Visit: Payer: Self-pay

## 2024-04-30 ENCOUNTER — Telehealth: Payer: Self-pay | Admitting: Family Medicine

## 2024-04-30 NOTE — Telephone Encounter (Signed)
 Copied from CRM #[id number]. Topic: General - Other >> Apr 30, 2024 12:47 PM   Corin V wrote:  Reason for CRM: Patient is calling with questions regarding the [employer]. Called [employer] and they were on lunch. Please call back with Spanish interpreter at [phone].

## 2024-05-03 ENCOUNTER — Other Ambulatory Visit: Payer: Self-pay

## 2024-05-03 ENCOUNTER — Ambulatory Visit: Admission: RE | Admit: 2024-05-03 | Payer: Self-pay | Source: Ambulatory Visit

## 2024-05-03 ENCOUNTER — Ambulatory Visit
Admission: RE | Admit: 2024-05-03 | Discharge: 2024-05-03 | Disposition: A | Payer: Self-pay | Source: Ambulatory Visit | Attending: Urology | Admitting: Urology

## 2024-05-03 DIAGNOSIS — C642 Malignant neoplasm of left kidney, except renal pelvis: Secondary | ICD-10-CM | POA: Insufficient documentation

## 2024-05-03 LAB — POCT I-STAT CREATININE: Creatinine, Ser: 0.6 mg/dL (ref 0.44–1.00)

## 2024-05-03 MED ORDER — IOHEXOL 300 MG/ML  SOLN
100.0000 mL | Freq: Once | INTRAMUSCULAR | Status: AC | PRN
  Administered 2024-05-03: 100 mL via INTRAVENOUS

## 2024-05-03 NOTE — Telephone Encounter (Unsigned)
 Copied from CRM [id number]. Topic: General - Other >> May 03, 2024  3:14 PM Winona R wrote: Pt would like to receive a call back in regards to financial; assistance.

## 2024-05-09 ENCOUNTER — Ambulatory Visit: Payer: Self-pay | Admitting: Urology

## 2024-05-09 ENCOUNTER — Encounter: Payer: Self-pay | Admitting: Urology

## 2024-05-09 VITALS — BP 120/79 | HR 72 | Ht 59.0 in | Wt 194.8 lb

## 2024-05-09 DIAGNOSIS — C642 Malignant neoplasm of left kidney, except renal pelvis: Secondary | ICD-10-CM

## 2024-05-13 ENCOUNTER — Ambulatory Visit: Payer: Self-pay | Admitting: Urology

## 2024-05-14 ENCOUNTER — Ambulatory Visit: Payer: Self-pay | Admitting: Urology

## 2025-05-12 ENCOUNTER — Other Ambulatory Visit: Payer: Self-pay

## 2025-05-22 ENCOUNTER — Ambulatory Visit: Payer: Self-pay | Admitting: Urology
